# Patient Record
Sex: Male | Born: 1955
Health system: Southern US, Community
[De-identification: ages and names within clinical notes are randomized; demographics above are authoritative.]

## PROBLEM LIST (undated history)

## (undated) DIAGNOSIS — D696 Thrombocytopenia, unspecified: Secondary | ICD-10-CM

## (undated) DIAGNOSIS — R768 Other specified abnormal immunological findings in serum: Secondary | ICD-10-CM

## (undated) DIAGNOSIS — I255 Ischemic cardiomyopathy: Secondary | ICD-10-CM

## (undated) DIAGNOSIS — N183 Chronic kidney disease, stage 3 unspecified: Secondary | ICD-10-CM

## (undated) DIAGNOSIS — R7689 Other specified abnormal immunological findings in serum: Secondary | ICD-10-CM

## (undated) DIAGNOSIS — I213 ST elevation (STEMI) myocardial infarction of unspecified site: Secondary | ICD-10-CM

## (undated) DIAGNOSIS — R7303 Prediabetes: Secondary | ICD-10-CM

## (undated) DIAGNOSIS — H269 Unspecified cataract: Secondary | ICD-10-CM

## (undated) DIAGNOSIS — I2510 Atherosclerotic heart disease of native coronary artery without angina pectoris: Secondary | ICD-10-CM

## (undated) DIAGNOSIS — I5022 Chronic systolic (congestive) heart failure: Secondary | ICD-10-CM

## (undated) HISTORY — DX: Chronic kidney disease, stage 3 unspecified: N18.30

## (undated) HISTORY — DX: ST elevation (STEMI) myocardial infarction of unspecified site: I21.3

## (undated) HISTORY — DX: Unspecified cataract: H26.9

## (undated) HISTORY — DX: Thrombocytopenia, unspecified: D69.6

## (undated) HISTORY — DX: Ischemic cardiomyopathy: I25.5

## (undated) HISTORY — DX: Atherosclerotic heart disease of native coronary artery without angina pectoris: I25.10

## (undated) HISTORY — DX: Other specified abnormal immunological findings in serum: R76.89

## (undated) HISTORY — DX: Chronic systolic (congestive) heart failure: I50.22

## (undated) HISTORY — DX: Other specified abnormal immunological findings in serum: R76.8

---

## 2000-02-12 ENCOUNTER — Encounter: Payer: Self-pay | Admitting: Family Medicine

## 2000-02-12 ENCOUNTER — Ambulatory Visit (HOSPITAL_COMMUNITY): Admission: RE | Admit: 2000-02-12 | Discharge: 2000-02-12 | Payer: Self-pay | Admitting: *Deleted

## 2002-12-10 DIAGNOSIS — I213 ST elevation (STEMI) myocardial infarction of unspecified site: Secondary | ICD-10-CM

## 2002-12-10 HISTORY — DX: ST elevation (STEMI) myocardial infarction of unspecified site: I21.3

## 2003-07-09 ENCOUNTER — Emergency Department (HOSPITAL_COMMUNITY): Admission: EM | Admit: 2003-07-09 | Discharge: 2003-07-09 | Payer: Self-pay | Admitting: Emergency Medicine

## 2003-08-22 ENCOUNTER — Inpatient Hospital Stay (HOSPITAL_COMMUNITY): Admission: EM | Admit: 2003-08-22 | Discharge: 2003-08-26 | Payer: Self-pay | Admitting: Emergency Medicine

## 2003-08-22 ENCOUNTER — Encounter: Payer: Self-pay | Admitting: Emergency Medicine

## 2003-08-22 ENCOUNTER — Encounter: Payer: Self-pay | Admitting: Cardiovascular Disease

## 2003-08-22 HISTORY — PX: CORONARY ANGIOPLASTY WITH STENT PLACEMENT: SHX49

## 2005-12-24 ENCOUNTER — Ambulatory Visit: Payer: Self-pay | Admitting: Family Medicine

## 2006-02-04 ENCOUNTER — Ambulatory Visit: Payer: Self-pay | Admitting: Family Medicine

## 2006-03-27 ENCOUNTER — Ambulatory Visit: Payer: Self-pay | Admitting: Family Medicine

## 2006-04-16 ENCOUNTER — Ambulatory Visit: Payer: Self-pay | Admitting: Family Medicine

## 2006-05-23 ENCOUNTER — Ambulatory Visit: Payer: Self-pay | Admitting: Family Medicine

## 2006-05-23 LAB — CONVERTED CEMR LAB: PSA: 1.2 ng/mL

## 2006-05-27 ENCOUNTER — Ambulatory Visit: Payer: Self-pay | Admitting: Family Medicine

## 2006-06-05 ENCOUNTER — Ambulatory Visit: Payer: Self-pay | Admitting: Internal Medicine

## 2006-06-25 ENCOUNTER — Emergency Department (HOSPITAL_COMMUNITY): Admission: EM | Admit: 2006-06-25 | Discharge: 2006-06-25 | Payer: Self-pay | Admitting: Emergency Medicine

## 2006-11-21 ENCOUNTER — Ambulatory Visit: Payer: Self-pay | Admitting: Family Medicine

## 2006-11-21 LAB — CONVERTED CEMR LAB: Microalbumin U total vol: 1.3 mg/L

## 2006-11-25 ENCOUNTER — Ambulatory Visit: Payer: Self-pay | Admitting: Family Medicine

## 2007-05-27 ENCOUNTER — Ambulatory Visit: Payer: Self-pay | Admitting: Family Medicine

## 2007-05-27 LAB — CONVERTED CEMR LAB
ALT: 27 units/L (ref 0–40)
AST: 18 units/L (ref 0–37)
Albumin: 3.8 g/dL (ref 3.5–5.2)
Alkaline Phosphatase: 69 units/L (ref 39–117)
BUN: 17 mg/dL (ref 6–23)
CO2: 27 meq/L (ref 19–32)
Creatinine, Ser: 1.4 mg/dL (ref 0.4–1.5)
GFR calc Af Amer: 69 mL/min
GFR calc non Af Amer: 57 mL/min
Microalb, Ur: 0.2 mg/dL (ref 0.0–1.9)
Sodium: 141 meq/L (ref 135–145)
TSH: 1.64 microintl units/mL (ref 0.35–5.50)
Total Bilirubin: 0.4 mg/dL (ref 0.3–1.2)
Total CHOL/HDL Ratio: 4.6
Total Protein: 6.4 g/dL (ref 6.0–8.3)

## 2007-05-28 ENCOUNTER — Encounter: Payer: Self-pay | Admitting: Family Medicine

## 2007-05-28 DIAGNOSIS — E749 Disorder of carbohydrate metabolism, unspecified: Secondary | ICD-10-CM | POA: Insufficient documentation

## 2007-05-29 ENCOUNTER — Ambulatory Visit: Payer: Self-pay | Admitting: Family Medicine

## 2008-04-29 ENCOUNTER — Telehealth: Payer: Self-pay | Admitting: Family Medicine

## 2008-04-30 ENCOUNTER — Ambulatory Visit: Payer: Self-pay | Admitting: Family Medicine

## 2008-04-30 DIAGNOSIS — R768 Other specified abnormal immunological findings in serum: Secondary | ICD-10-CM | POA: Insufficient documentation

## 2008-05-04 ENCOUNTER — Ambulatory Visit: Payer: Self-pay | Admitting: Family Medicine

## 2008-05-04 LAB — CONVERTED CEMR LAB
AST: 20 units/L (ref 0–37)
CO2: 29 meq/L (ref 19–32)
Calcium: 9.2 mg/dL (ref 8.4–10.5)
Chloride: 107 meq/L (ref 96–112)
Creatinine, Ser: 1.5 mg/dL (ref 0.4–1.5)
GFR calc non Af Amer: 52 mL/min
Glucose, Bld: 128 mg/dL — ABNORMAL HIGH (ref 70–99)
HCV Ab: POSITIVE — AB
Hep B C IgM: NEGATIVE
Sodium: 138 meq/L (ref 135–145)
Total Protein: 6.2 g/dL (ref 6.0–8.3)

## 2008-05-05 ENCOUNTER — Ambulatory Visit: Payer: Self-pay | Admitting: Family Medicine

## 2008-08-27 ENCOUNTER — Ambulatory Visit: Payer: Self-pay | Admitting: Gastroenterology

## 2008-09-28 ENCOUNTER — Ambulatory Visit: Payer: Self-pay | Admitting: Family Medicine

## 2008-09-28 LAB — CONVERTED CEMR LAB
ALT: 36 units/L (ref 0–53)
AST: 51 units/L — ABNORMAL HIGH (ref 0–37)
Albumin: 4 g/dL (ref 3.5–5.2)
Alkaline Phosphatase: 61 units/L (ref 39–117)
BUN: 16 mg/dL (ref 6–23)
Basophils Absolute: 0 10*3/uL (ref 0.0–0.1)
Basophils Relative: 0.3 % (ref 0.0–3.0)
Bilirubin, Direct: 0.2 mg/dL (ref 0.0–0.3)
CO2: 32 meq/L (ref 19–32)
Calcium: 9.2 mg/dL (ref 8.4–10.5)
Chloride: 105 meq/L (ref 96–112)
Cholesterol: 149 mg/dL (ref 0–200)
Creatinine, Ser: 1.4 mg/dL (ref 0.4–1.5)
Creatinine,U: 127.1 mg/dL
Eosinophils Absolute: 0.2 10*3/uL (ref 0.0–0.7)
Eosinophils Relative: 4.5 % (ref 0.0–5.0)
GFR calc Af Amer: 68 mL/min
GFR calc non Af Amer: 56 mL/min
Glucose, Bld: 91 mg/dL (ref 70–99)
HCT: 46.9 % (ref 39.0–52.0)
HDL: 28.8 mg/dL — ABNORMAL LOW (ref >39.0)
Hemoglobin: 16.4 g/dL (ref 13.0–17.0)
LDL Cholesterol: 107 mg/dL — ABNORMAL HIGH (ref 0–99)
Lymphocytes Relative: 31.6 % (ref 12.0–46.0)
MCHC: 35 g/dL (ref 30.0–36.0)
MCV: 92.1 fL (ref 78.0–100.0)
Microalb Creat Ratio: 1.6 mg/g (ref 0.0–30.0)
Microalb, Ur: 0.2 mg/dL (ref 0.0–1.9)
Monocytes Absolute: 0.4 10*3/uL (ref 0.1–1.0)
Monocytes Relative: 7.9 % (ref 3.0–12.0)
Neutro Abs: 2.8 10*3/uL (ref 1.4–7.7)
Neutrophils Relative %: 55.7 % (ref 43.0–77.0)
Platelets: 113 10*3/uL — ABNORMAL LOW (ref 150–400)
Potassium: 4.2 meq/L (ref 3.5–5.1)
RBC: 5.14 M/uL (ref 4.22–5.81)
RDW: 12.3 % (ref 11.5–14.6)
Sodium: 141 meq/L (ref 135–145)
Total Bilirubin: 1 mg/dL (ref 0.3–1.2)
Total CHOL/HDL Ratio: 5.2
Total Protein: 6.6 g/dL (ref 6.0–8.3)
Triglycerides: 64 mg/dL (ref 0–149)
VLDL: 13 mg/dL (ref 0–40)
WBC: 5.3 10*3/uL (ref 4.5–10.5)

## 2008-10-05 ENCOUNTER — Ambulatory Visit: Payer: Self-pay | Admitting: Family Medicine

## 2008-11-24 ENCOUNTER — Ambulatory Visit: Payer: Self-pay | Admitting: Family Medicine

## 2008-11-24 ENCOUNTER — Encounter (INDEPENDENT_AMBULATORY_CARE_PROVIDER_SITE_OTHER): Payer: Self-pay | Admitting: *Deleted

## 2008-11-24 LAB — CONVERTED CEMR LAB
OCCULT 1: NEGATIVE
OCCULT 2: NEGATIVE
OCCULT 3: NEGATIVE

## 2009-10-12 ENCOUNTER — Ambulatory Visit: Payer: Self-pay | Admitting: Family Medicine

## 2010-07-13 ENCOUNTER — Encounter (INDEPENDENT_AMBULATORY_CARE_PROVIDER_SITE_OTHER): Payer: Self-pay | Admitting: *Deleted

## 2010-07-28 ENCOUNTER — Telehealth (INDEPENDENT_AMBULATORY_CARE_PROVIDER_SITE_OTHER): Payer: Self-pay | Admitting: *Deleted

## 2010-07-31 ENCOUNTER — Ambulatory Visit: Payer: Self-pay | Admitting: Family Medicine

## 2010-07-31 LAB — CONVERTED CEMR LAB
AST: 21 units/L (ref 0–37)
Albumin: 3.8 g/dL (ref 3.5–5.2)
BUN: 17 mg/dL (ref 6–23)
Basophils Relative: 0.5 % (ref 0.0–3.0)
Cholesterol: 161 mg/dL (ref 0–200)
Creatinine, Ser: 1.4 mg/dL (ref 0.4–1.5)
Eosinophils Relative: 6.2 % — ABNORMAL HIGH (ref 0.0–5.0)
GFR calc non Af Amer: 69.41 mL/min (ref >60)
Glucose, Bld: 101 mg/dL — ABNORMAL HIGH (ref 70–99)
HCT: 49.9 % (ref 39.0–52.0)
HDL: 31.7 mg/dL — ABNORMAL LOW (ref >39.00)
LDL Cholesterol: 116 mg/dL — ABNORMAL HIGH (ref 0–99)
Lymphs Abs: 1.8 10*3/uL (ref 0.7–4.0)
MCV: 92.2 fL (ref 78.0–100.0)
Monocytes Absolute: 0.4 10*3/uL (ref 0.1–1.0)
Monocytes Relative: 8.1 % (ref 3.0–12.0)
Neutrophils Relative %: 50.3 % (ref 43.0–77.0)
PSA: 2.01 ng/mL (ref 0.10–4.00)
Platelets: 115 10*3/uL — ABNORMAL LOW (ref 150.0–400.0)
RBC: 5.41 M/uL (ref 4.22–5.81)
Total Bilirubin: 0.6 mg/dL (ref 0.3–1.2)
Triglycerides: 66 mg/dL (ref 0.0–149.0)
VLDL: 13.2 mg/dL (ref 0.0–40.0)
WBC: 5.1 10*3/uL (ref 4.5–10.5)

## 2010-08-03 ENCOUNTER — Ambulatory Visit: Payer: Self-pay | Admitting: Family Medicine

## 2010-08-04 ENCOUNTER — Ambulatory Visit (HOSPITAL_BASED_OUTPATIENT_CLINIC_OR_DEPARTMENT_OTHER): Admission: RE | Admit: 2010-08-04 | Discharge: 2010-08-04 | Payer: Self-pay | Admitting: Orthopedic Surgery

## 2010-08-07 ENCOUNTER — Ambulatory Visit: Payer: Self-pay | Admitting: Cardiology

## 2010-08-11 ENCOUNTER — Telehealth (INDEPENDENT_AMBULATORY_CARE_PROVIDER_SITE_OTHER): Payer: Self-pay | Admitting: *Deleted

## 2010-08-22 ENCOUNTER — Encounter: Payer: Self-pay | Admitting: Cardiology

## 2010-08-22 ENCOUNTER — Ambulatory Visit: Payer: Self-pay

## 2010-10-10 ENCOUNTER — Telehealth (INDEPENDENT_AMBULATORY_CARE_PROVIDER_SITE_OTHER): Payer: Self-pay | Admitting: *Deleted

## 2010-10-16 ENCOUNTER — Telehealth (INDEPENDENT_AMBULATORY_CARE_PROVIDER_SITE_OTHER): Payer: Self-pay | Admitting: *Deleted

## 2011-01-11 NOTE — Progress Notes (Signed)
Summary: CALLED PT  Phone Note Outgoing Call Call back at [facility] Phone [phone]   Call placed by: Harlon Flor,  October 16, 2010 10:51 AM Call placed to: Patient Summary of Call: CALLED TO SCHEDULE FASTING LABS Initial call taken by: Harlon Flor,  October 16, 2010 10:51 AM

## 2011-01-11 NOTE — Letter (Signed)
Summary: PHI  PHI   Imported By: Harlon Flor 08/22/2010 11:14:53  _____________________________________________________________________  External Attachment:    Type:   Image     Comment:   External Document

## 2011-01-11 NOTE — Cardiovascular Report (Signed)
Summary: Cardiac Cath Other  Cardiac Cath Other   Imported By: Harlon Flor 08/22/2010 11:13:33  _____________________________________________________________________  External Attachment:    Type:   Image     Comment:   External Document

## 2011-01-11 NOTE — Progress Notes (Signed)
Summary: CALLED PT  Phone Note Outgoing Call Call back at [facility] Phone [phone]   Call placed by: Harlon Flor,  October 10, 2010 10:56 AM Call placed to: Patient Summary of Call: [facility] TO SCHEDULE LAB APPT Initial call taken by: Harlon Flor,  October 10, 2010 10:57 AM

## 2011-01-11 NOTE — Progress Notes (Signed)
----   Converted from flag ---- ---- 08/09/2010 2:58 PM, Sydell Axon LPN wrote: Sherron Monday to patient and was informed that he had the colonoscopy at [facility] out patient about 5-6 years ago. In the meantime, he will check his records and try to find out who did it because he does not remember the doctor's name. Regina ---- 08/07/2010 8:27 AM, Delilah Shan  CMA (AAMA) wrote: Left message on voicemail  to return call.   ---- 08/04/2010 8:27 AM, Delilah Shan CMA (AAMA) wrote: Left message on voicemail  to return call.   ---- 08/03/2010 9:19 PM, Crawford Givens MD wrote: please call him and see if you can get any more details.  thanks.   ---- 08/03/2010 9:33 AM, Delilah Shan CMA (AAMA) wrote: The main office checked his chart and there is no colonoscopy report in it at all.  Perhaps he went to another GI office in [place].  We would need more info from him, I suppose. ------------------------------  Appended Document:     Clinical Lists Changes  Observations: Added new observation of DM PROGRESS: N/A (08/11/2010 15:17) Added new observation of DM FSREVIEW: N/A (08/11/2010 15:17) Added new observation of HTN PROGRESS: N/A (08/11/2010 15:17) Added new observation of HTN FSREVIEW: N/A (08/11/2010 15:17) Added new observation of COLPO: normal:   (02/12/2000 15:18)       Colposcopy  Procedure date:  02/12/2000  Findings:      normal:      Prevention & Chronic Care Immunizations   Influenza vaccine: Fluvax 3+  (09/27/2008)   Influenza vaccine due: 08/10/2010    Tetanus booster: 08/04/2010: Tdap    Pneumococcal vaccine: Not documented   Pneumococcal vaccine due: 09/19/2020  Colorectal Screening   Hemoccult: negative  (11/24/2008)   Hemoccult action/deferral: NEG X 1 today  (08/03/2010)    Colonoscopy: Not documented  Other Screening   PSA: 2.01  (07/31/2010)   PSA due due: 08/01/2011   Smoking status: quit  (05/28/2007)  Lipids   Total Cholesterol: 161   (07/31/2010)   LDL: 116  (07/31/2010)   LDL Direct: Not documented   HDL: 31.70  (07/31/2010)   Triglycerides: 66.0  (07/31/2010)    SGOT (AST): 21  (07/31/2010)   SGPT (ALT): 29  (07/31/2010)   Alkaline phosphatase: 66  (07/31/2010)   Total bilirubin: 0.6  (07/31/2010)  Self-Management Support :    Lipid self-management support: Not documented

## 2011-01-11 NOTE — Letter (Signed)
Summary: Nadara Eaton letter  [place] [facility]  [address] [place], [place] [address]   Phone: [phone]  Fax: [phone]       07/13/2010 MRN: [id number]  BENJAMYN HESTAND [address] [place], [place]  [address]  Dear Mr. Melchor,  [facility] announce the retirement of Arta Silence, M.D., from full-time practice at the [facility] office effective June 08, 2010 and his plans of returning part-time.  It is important to Dr. Hetty Ely and to our practice that you understand that [facility] has seven physicians in our office for your health care needs.  We will continue to offer the same exceptional care that you have today.    Dr. Hetty Ely has spoken to many of you about his plans for retirement and returning part-time in the fall.   We will continue to work with you through the transition to schedule appointments for you in the office and meet the high standards that [facility] is committed to.   Again, it is with great pleasure that we share the news that Dr. Hetty Ely will return to [facility] in October of 2011 with a reduced schedule.    If you have any questions, or would like to request an appointment with one of our physicians, please call us at [phone] and press the option for Scheduling an appointment.  We take pleasure in providing you with excellent patient care and look forward to seeing you at your next office visit.  Our [facility] Physicians are:  Tillman Abide, M.D. Laurita Quint, M.D. Roxy Manns, M.D. Kerby Nora, M.D. Hannah Beat, M.D. Ruthe Mannan, M.D. We proudly welcomed Raechel Ache, M.D. and Eustaquio Boyden, M.D. to the practice in July/August 2011.  Sincerely,  [facility]

## 2011-01-11 NOTE — Progress Notes (Signed)
----   Converted from flag ---- ---- 07/28/2010 9:53 AM, Crawford Givens MD wrote: cmet 414.00 lipid 414.00 CBC 070.51 PSA v76.44  thanks.   ---- 07/28/2010 9:37 AM, Liane Comber CMA (AAMA) wrote: Peri Jefferson Morning! This pt is scheduled for cpx labs tomorrow, which labs to draw and dx codes to use? Thanks Tasha ------------------------------

## 2011-01-11 NOTE — Assessment & Plan Note (Signed)
Summary: NP6/AMD   Visit Type:  Initial Consult Primary Provider:  Dr. Para March  CC:  Establish care with cardiologist--had stents placed in 2004.Marland Kitchen  History of Present Illness: 55 yo with history of CAD s/p STEMI and DES x 2 in 2004 presents to establish outpatient cardiology followup.  He has been doing well in general since his MI.  He is currently only taking aspirin.  He was on Lipitor then Crestor for about 6 months post-MI but felt like they caused fatigue, so he stopped them.  He was on a beta blocker also briefly which caused fatigue as well.  He has had no recurrent CP or exertional shortness of breath.  He continues to work in [employer] without any problems.    ECG: NSR, inferior and anterolateral T wave inversions  Labs (8/11): LDL 116, HDL 32, K 4.1, creatinine 1.4, LFTs normal U Current Medications (verified): 1)  Aspirin 325 Mg  Tabs (Aspirin) .... Take One By Mouth Daily 2)  Fish Oil   Caps (Omega-3 Fatty Acids Caps) .Marland Kitchen.. 1 Daily By Mouth  Allergies (verified): No Known Drug Allergies  Past History:  Past Surgical History: Last updated: 05/28/2007 MI Stent  08/22/2003 ETT wnl 09/29/03  Family History: Last updated: 08/03/2010 Father: dec 42 Aneurysm of the brain, stroke Mother: A  ASCVD, HTN Brother A   40  MI- self, uncles Two of his children have diabetes  Social History: Last updated: 08/07/2010 Marital Status: Married, 1980 Lives in [place].  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 Coronary artery disease: STEMI 2004 with totally occluded CFX (infarct-related artery) and 80% mRCA stenosis.  Patient had Taxus DES to both vessels.  EF was 50-55% by LV-gram. 2. H/o HCV  Ab+ with prev eval at [facility] hepatology clinic  Family History: Reviewed history from 08/03/2010 and no changes required. Father: dec 42 Aneurysm of the brain, stroke Mother: A  ASCVD, HTN Brother A   56  MI- self, uncles Two of his children have diabetes  Social History: Marital Status: Married, 1980 Lives in [place].  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   Vital Signs:  Patient profile:   55 year old male Height:      69.5 inches Weight:      217 pounds BMI:     31.70 Pulse rate:   66 / minute BP sitting:   128 / 82  (left arm) Cuff size:   large  Vitals Entered By: Bishop Dublin, CMA (August 07, 2010 3:23 PM)  Physical Exam  General:  Well developed, well nourished, in no acute distress. Head:  normocephalic and atraumatic Nose:  no deformity, discharge, inflammation, or lesions Mouth:  Teeth, gums and palate normal. Oral mucosa normal. Neck:  Neck supple, no JVD. No masses, thyromegaly or abnormal cervical nodes. Lungs:  Clear bilaterally to auscultation and percussion. Heart:  Non-displaced PMI, chest non-tender; regular rate and rhythm, S1, S2 without murmurs, rubs or gallops. Carotid upstroke normal, no bruit.  Pedals normal pulses. No edema, no varicosities. Abdomen:  Bowel sounds positive; abdomen soft and non-tender without  masses, organomegaly, or hernias noted. No hepatosplenomegaly. Msk:  Back normal, normal gait. Muscle strength and tone normal. Extremities:  No clubbing or cyanosis. Neurologic:  Alert and oriented x 3. Skin:  Intact without lesions or rashes. Psych:  Normal affect.   Impression & Recommendations:  Problem # 1:  CORONARY ARTERY DISEASE,S/P MI 08/22/03, PTCA (ICD-414.00) Patient has had no recent ischemic symptoms and has excellent exercise tolerance.  We talked  about statin and ACEI today for secondary prevention.  He is willing to try a low dose of both.  I will start him on lisinopril 2.5 mg daily and pravastatin 40 mg daily.  This is a low dose of a low potency statin and tends to be well-tolerated.  Will get a BMET in 2 wks and lipids/LFTs in 2 months.  He will call us with any side effects.  He will continue ASA.  Would avoid beta blocker for now (made him fatigued).  Will get echo to assess LV function, no indication for stress test.   Problem # 2:  HYPERCHOLESTEROLEMIA, PURE, LDL 100/ DECREASED HDL (ICD-272.0) Starting pravastatin, goal LDL < 70.   Other Orders: EKG w/ Interpretation (93000) Echocardiogram (Echo)  Patient Instructions: 1)  Your physician has recommended you make the following change in your medication: Start Pravastatin 40 mg one tablet at bedtime 2)  with Lisinopril 2.5 mg take one tablet everyday.  3)  Your physician recommends that you return for lab work in:2 weeks for a BMP. 4)  Your physician recommends that you return for a FASTING lipid profile: LIV/LIP two months. 5)  Your physician recommends that you schedule a follow-up appointment in: one year. 6)  Your physician has requested that you have an echocardiogram.  Echocardiography is a painless test that uses sound waves to create images of your heart. It provides your doctor with information about the size and shape of your heart and how well your heart's chambers and valves are working.  This procedure takes approximately one hour. There are no restrictions for this procedure. Prescriptions: PRAVASTATIN SODIUM 40 MG TABS (PRAVASTATIN SODIUM) one tablet at bedtime  #30 x 1   Entered by:   Bishop Dublin, CMA   Authorized by:   Marca Ancona, MD   Signed by:   Bishop Dublin, CMA on 08/07/2010   Method used:   Electronically to        [facility] [address] (retail)       [address], [address]       [place], [place]  [address]       Ph:  [phone]       Fax: [phone]   RxID:   [id number] LISINOPRIL 2.5 MG TABS (LISINOPRIL) one tablet once daily  #30 x 3   Entered by:   Bishop Dublin, CMA   Authorized by:   Marca Ancona, MD   Signed by:   Bishop Dublin, CMA on 08/07/2010   Method used:   Electronically to        [facility] [address] (retail)       [address], [address]       [place], [place]  [address]       Ph: [phone]       Fax: [phone]   RxID:   [id number]

## 2011-01-11 NOTE — Procedures (Signed)
Summary: Colonoscopy/[facility]  Colonoscopy/[facility]   Imported By: Lanelle Bal 08/21/2010 12:00:28  _____________________________________________________________________  External Attachment:    Type:   Image     Comment:   External Document

## 2011-01-11 NOTE — Assessment & Plan Note (Signed)
Summary: CPX/DR SCHALLER'S PT/CLE   Vital Signs:  Patient profile:   55 year old male Height:      69.5 inches Weight:      217.75 pounds BMI:     31.81 Temp:     98.5 degrees F oral Pulse rate:   76 / minute Pulse rhythm:   regular BP sitting:   108 / 72  (left arm) Cuff size:   large  Vitals Entered By: Delilah Shan CMA [place] Nellums Dull) (August 03, 2010 8:34 AM) CC: CPX - RNS patient, Preventive Care   History of Present Illness: CPE- See prev med.   CAD- prev stented by Dr. Riley Kill.  No CP, SOB, BLE edema.  No NTG use.  Feeling well.  hasn't had follow up with cards in years.   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 Father: dec 42 Aneurysm of the brain, stroke Mother: A  ASCVD, HTN Brother A   54  MI- self, uncles Two of his children have diabetes  Social History: Reviewed history from 05/28/2007 and no changes required. 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  Otherwise negative.    Physical Exam  General:  GEN: nad, alert and oriented HEENT: mucous membranes moist NECK: supple w/o LA CV: rrr.  no murmur PULM: ctab, no inc wob ABD: soft, +bs EXT:  no edema SKIN: no acute rash  Rectal:  No external abnormalities noted. Normal sphincter tone. No rectal masses or tenderness. Prostate:  Prostate gland firm and smooth, no enlargement, nodularity, tenderness, mass, asymmetry or induration.   Impression & Recommendations:  Problem # 1:  Preventive Health Care (ICD-V70.0) Per patient, prev with neg colonosocopy but I don't have records to review.  We are working to obtain these. Tdap today, flu to be done at work.  We are requesting eval from hepatology clinic at [facility].   Problem # 2:  CORONARY ARTERY DISEASE,S/P MI 08/22/03, PTCA (ICD-414.00)  Refer back to cards for eval.  I didn't start statin today.  Patient has no symptoms and prev had fatigue on statin. His updated medication list for this problem includes:    Aspirin 325 Mg Tabs (Aspirin) .Marland Kitchen... Take one by mouth daily  Orders: Cardiology Referral (Cardiology)  Complete Medication List: 1)  Aspirin 325 Mg Tabs (Aspirin) .... Take one by mouth daily 2)  Fish Oil Caps (Omega-3 fatty acids caps) .Marland Kitchen.. 1 daily by mouth  Colorectal Screening:  Current Recommendations:    Hemoccult: NEG X 1 today  PSA Screening:    PSA: 2.01  (07/31/2010)  Immunization & Chemoprophylaxis:    Tetanus vaccine: Td  (12/11/1996)    Influenza vaccine: Fluvax 3+  (09/27/2008)  Patient Instructions: 1)  See Shirlee Limerick  about your referral before your leave today.   I would get a flu shot at work.  I'll contact you about the old records.  Take care.  Work on [employer] and keep exercising.  2)  Please schedule a follow-up appointment as needed .   Current Allergies (reviewed today): No known allergies      Prevention & Chronic Care Immunizations   Influenza vaccine: Fluvax 3+  (09/27/2008)   Influenza vaccine due: 08/10/2010    Tetanus booster: 12/11/1996: Td    Pneumococcal vaccine: Not documented   Pneumococcal vaccine due: 09/19/2020  Colorectal Screening   Hemoccult: negative  (11/24/2008)    Hemoccult action/deferral: NEG X 1 today  (08/03/2010)    Colonoscopy: Not documented  Other Screening   PSA: 2.01  (07/31/2010)   PSA due due: 08/01/2011   Smoking status: quit  (05/28/2007)  Lipids   Total Cholesterol: 161  (07/31/2010)   LDL: 116  (07/31/2010)   LDL Direct: Not documented   HDL: 31.70  (07/31/2010)   Triglycerides: 66.0  (07/31/2010)    SGOT (AST): 21  (07/31/2010)   SGPT (ALT): 29  (07/31/2010)   Alkaline phosphatase: 66  (07/31/2010)   Total bilirubin: 0.6  (07/31/2010)  Self-Management Support :    Lipid self-management support: Not documented     Appended Document: CPX/DR SCHALLER'S PT/CLE   Immunizations Administered:  Tetanus Vaccine:    Vaccine Type: Tdap    Site: left deltoid    Mfr: GlaxoSmithKline    Dose: 0.5 ml    Route: IM    Given by: Delilah Shan CMA (AAMA)    Exp. Date: 09/28/2012    Lot #: [id number]    VIS given: 10/28/07 version given August 04, 2010.

## 2011-04-27 NOTE — Procedures (Signed)
[place]  Patient:    Edward [name], Edward [name]                       MRN: [id number] Proc. Date: 02/12/00 Adm. Date:  [id number] Attending:  Mingo Amber CC:         Willis Modena. Dreiling, M.D.                           Procedure Report  PROCEDURE:  Video colonoscopy.  INDICATIONS:  Guaiac positive stool in a 55 year old male.  He essentially has o symptoms, and his preprocedure hemoglobin was 17.  PREPARATION:  He was n.p.o. since midnight having taking Phospho-Soda prep and  clear liquid diet yesterday.  The mucosa is clean throughout.  Depth of insertion, cecum.  PREPROCEDURE SEDATION:  He received a total of 80 mg of Demerol and 8 mg of Versed intravenously.  In addition, he was on 2 L of nasal cannula O2.  DESCRIPTION OF PROCEDURE:  The Olympus video colonoscope was inserted via the rectum and advanced quite easily to the hepatic flexure.  At this point, the _____ abdominal pressure was required in order to obtain the cecum.  Cecal landmarks ere identified and photographed.  On withdrawal, the mucosa was carefully evaluated and found to be entirely normal from the cecum to retroflexed view of the rectum. There were no areas of neoplasia, diverticulosis, or other abnormality.  There ere no significant internal hemorrhoids.  The patient tolerated the procedure well.  Pulse, blood pressure, and oximetry testing were stable throughout.  He was observed in recovery for 45 minutes and discharged home, alert with a benign abdomen.  IMPRESSION:  Normal colonoscopy.  PLAN:  Return to the care of Dr. Lattie Corns.  I will be glad to reevaluate if needed. DD:  02/12/00 TD:  02/13/00 Job: [id number]

## 2011-04-27 NOTE — H&P (Signed)
NAME:  Edward [name], Edward [name]                         ACCOUNT NO.:  [id number]   MEDICAL RECORD NO.:  [id number]                   PATIENT TYPE:  INP   LOCATION:  2931                                 FACILITY:  [facility]   PHYSICIAN:  Arturo Morton. Riley Kill, M.D.             DATE OF BIRTH:  17-Aug-1956   DATE OF ADMISSION:  08/22/2003  DATE OF DISCHARGE:                                HISTORY & PHYSICAL   CHIEF COMPLAINT:  Chest pain.   HISTORY OF PRESENT ILLNESS:  The patient is a 55 year old man who presents  to the emergency room with complaints of substernal chest tightness that  began at approximately 10 p.m.  The patient states that he had just eaten  dinner, and sat down to watch television when the pain occurred, which was  7/10 in intensity, and radiated across his chest, but not into his arms or  neck.  He denies shortness of breath, nausea, vomiting, diaphoresis.  The  pain was dull in nature.  He took an Alka-Seltzer at home for this gas  sensation, and this was without relief.  He was given nitroglycerin,  aspirin, and Toprol x3 in the ER without relief.  He had ongoing chest pain,  and other history shows that he has been having generalized weakness and  inability to fully exert himself for 1-2 weeks.  No recent fevers, chills,  dyspnea on exertion, orthopnea, PND, lower extremity edema, etc.  The team  was called at approximately 2:15 to 2:20 a.m. for further evaluation.   PAST MEDICAL HISTORY:  None.   MEDICATIONS:  None.   ALLERGIES:  None.   FAMILY HISTORY:  His mother is alive with a history of a stroke.  His father  died at age 65 of a brain aneurysm.  He has a brother who is healthy.  He  has three children, two of which have type 1 diabetes.   REVIEW OF SYSTEMS:  Other review of systems is completely negative for  general, constitutional, HEENT, cardiopulmonary, musculoskeletal, GI, GU,  neurologic, etc.   SOCIAL HISTORY:  The patient lives in [place] with his wife.   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  He has a 15 pack year history of smoking.  No  drug use.  No alcohol.   PHYSICAL EXAMINATION:  VITAL SIGNS:  On admission, temperature 98.1, blood  pressure 176/91, pulse 72, respirations 18.  He was satting 98% on room air.  GENERAL:  He is a comfortable-appearing man in no apparent distress,  complaining of 3/10 chest pain.  HEENT:  Within normal limits.  NECK:  Supple.  No JVD.  No bruits.  CARDIOVASCULAR:  Regular rate and rhythm.  No murmurs, rubs, or gallops.  LUNGS:  Clear to auscultation bilaterally.  No wheezes, rhonchi, or rales.  ABDOMEN:  Soft, nontender, nondistended.  No hepatosplenomegaly.  EXTREMITIES:  No clubbing, cyanosis, or edema.  There  were 3+ pulses in the  distal lower extremities.  NEUROLOGIC:  Intact.   LABORATORY DATA:  Sodium 141, potassium 3.5, chloride 103, bicarb 32, BUN  13, creatinine 1.5, glucose 136.  White count 8.4, hematocrit 49, platelets  128.  PT 12.6, INR 0.9, PTT 33.  MB is 26.  Troponin is 0.46.  Myoglobin is  greater than 500.  EKG shows ST elevations in V1 through V3 with reciprocal  ST depressions in the inferior leads.   ASSESSMENT AND PLAN:  A 55 year old man with tobacco history and newly-  diagnosed hypertension who presents with anterior ST elevation myocardial  infarction and approximately five hours of chest pain.  ST elevation  myocardial infarction.  The patient will go emergently to the  catheterization laboratory after receiving Integrelin, heparin, aspirin,  Plavix, nitroglycerin, and beta blocker in the emergency room.  Given the  distribution of his EKG changes, we strongly suspect an left anterior  descending lesion.  The patient was encouraged to quit smoking.  We will  check his lipids and continue beta blocker, ACE inhibitor.  His hemoglobin  A1C will be checked, and we will follow his blood pressure closely.  Following the catheterization, he will have post-myocardial  infarction care  in the CCU, and be followed for further chest pain and more evidence for  hypertension.      Patric Dykes, M.D.                       Arturo Morton. Riley Kill, M.D.    JF/MEDQ  D:  08/22/2003  T:  08/23/2003  Job:  [id number]

## 2011-04-27 NOTE — Cardiovascular Report (Signed)
NAME:  Edward [name], Edward [name]                         ACCOUNT NO.:  [id number]   MEDICAL RECORD NO.:  [id number]                   PATIENT TYPE:  INP   LOCATION:  6526                                 FACILITY:  [facility]   PHYSICIAN:  Arturo Morton. Riley Kill, M.D.             DATE OF BIRTH:  Oct 26, 1956   DATE OF PROCEDURE:  08/25/2003  DATE OF DISCHARGE:                              CARDIAC CATHETERIZATION   INDICATIONS:  Mr. Kent is a delightful 55 year old gentleman who presented  with an acute infarction.  He had total occlusion of the circumflex which  was successfully stented.  He had residual high grade disease of a very  large right coronary artery and it was felt that percutaneous intervention  of this artery should be done before discharge.  Risks, benefits, and  alternatives were discussed with the patient.  He was agreeable to proceed.   PROCEDURE:  1. Percutaneous stenting of the right coronary artery.  2. Selective coronary arteriography of the left coronary artery.   DESCRIPTION OF PROCEDURE:  The patient was brought to the catheterization  laboratory and prepped and draped in the usual fashion.  Through an anterior  puncture the left femoral artery was entered.  The patient had a blood  pressure in the range of about 100 systolic and therefore we gave  intravenous fluids.  Using a diagnostic left catheter we injected the left  coronary artery and documented patency of the circumflex vessel.  Following  this, a JR4 guiding catheter was advanced to the central aorta.  Angiomax  was given according to protocol and an ACT was checked and was 325 seconds.  A JR4 with side holes was utilized to intubate the right coronary.  Following this, a very small dose of intracoronary nitroglycerin was  administered which demonstrated a very large caliber right coronary artery.  A high-torque floppy wire was then passed down the vessel and the vessel was  directly stented using a 33 x 3.5 Taxus  stent.  This was taken to 14  atmospheres and then post dilated with 4 mm balloon up to about 14  atmospheres in various locations throughout the stent.  There was an  excellent angiographic appearance at the completion of the procedure.   ANGIOGRAPHIC DATA:  1. The left main coronary has some tapering at the ostium, but no     significant stenosis.  2. The circumflex at the site of previous stenting shows a step up and step     down without critical narrowing.  There is excellent TIMI 3 flow into the     distal vessel.  3. The right coronary artery demonstrate some mild ostial irregularity and a     long area of segmental disease of about 80-85% in the mid vessel.  The     PDA has about 30-40% ostial narrowing.  There is also an area of mild     eccentric luminal  irregularity in the distal vessel.  Following stenting,     the area that was stented was reduced to 0% residual luminal narrowing.     There was an excellent angiographic appearance.   CONCLUSIONS:  Successful percutaneous stenting of the right coronary artery.    DISPOSITION:  The patient will be treated medically with aspirin and Plavix.  He will need careful long-term follow-up.                                               Arturo Morton. Riley Kill, M.D.    TDS/MEDQ  D:  08/25/2003  T:  08/25/2003  Job:  [id number]   cc:   CV Lab

## 2011-04-27 NOTE — Cardiovascular Report (Signed)
NAME:  Edward [name], Edward [name]                         ACCOUNT NO.:  [id number]   MEDICAL RECORD NO.:  [id number]                   PATIENT TYPE:  INP   LOCATION:  2931                                 FACILITY:  [facility]   PHYSICIAN:  Arturo Morton. Riley Kill, M.D.             DATE OF BIRTH:  02/04/1956   DATE OF PROCEDURE:  08/22/2003  DATE OF DISCHARGE:                              CARDIAC CATHETERIZATION   INDICATIONS:  Edward [name] is a delightful 55 year old gentleman who presented  to the emergency room with ongoing chest pain.  Enzymes were positive.  There were borderline echocardiographic abnormalities.  He was seen by Heloise Beecham, M.D. [facility] and subsequently referred for reperfusion therapy.   PROCEDURE:  1. Left heart catheterization.  2. Selective coronary arteriography.  3. Selective left ventriculography.  4. Percutaneous stenting of the circumflex marginal coronary artery.   DESCRIPTION OF PROCEDURE:  The patient was brought to the catheterization  laboratory and prepped and draped in the usual fashion.  Integrilin was  running at the time of the procedure.  ACT was checked and was found to be  appropriate.  Views of the left and right coronary arteries were obtained in  multiple angiographic projections.  Ventriculography was performed in both  the RAO and LAO projection.  The patient was noted to have an occlusion of  the circumflex marginal.  A JL3.5 guiding catheter was then utilized to  intubate the left main.  A 7-French system was used.  A 0.014 high-torque  floppy wire was then shaped and used across the site of total occlusion.  Pre dilatation was done with a 2.25 mm balloon.  The lesion was subsequently  stented using a 2.5 x 20 mm length [employer] Taxus stent.  The stent  was then post dilated with a 2.5 mm noncompliant balloon.  There was  reestablishment of TIMI 3 flow.  The patient tolerated the procedure well.  All catheters were subsequently removed and the  femoral sheath was sewn into  place.  He was taken to the CCU in satisfactory clinical condition.   HEMODYNAMIC DATA:  1. Central aortic pressure 142/86, mean 111.  2. Left ventricular pressure 135/9/21 .  3. No gradient on pullback across the aortic valve.   ANGIOGRAPHIC DATA:  1. The left main coronary artery was free of critical disease.  2. The left anterior descending artery coursed to the apex.  There was a 60%     area of segmental luminal irregularity noted in the proximal vessel     overlapping the origins of the two septal perforators.  There were distal     diagonal branches noted and the distal LAD wrapped the apex.  There was a     moderate amount of luminal irregularity noted throughout the LAD.  3. The circumflex provides a first marginal branch which is totally occluded     with TIMI 0 flow.  There is very faint opacification of the distal vessel     but this would not qualify for TIMI 1 flow.  There is segmental disease     of about 60% in the AV circumflex beyond the origin of the marginal     branch and this provides a smaller marginal system.  Following     reperfusion therapy and stenting, the total occlusion is reduced to 0%     residual luminal narrowing.  There is excellent TIMI 3 flow at the     completion of procedure.  4. The right coronary artery demonstrates a long area of 80% segmental     narrowing in the mid vessel.  The vessel is a large caliber vessel     appearing between 3.5-4 mm.  The vessel provides a posterior descending     branch which has about a segmental area of smooth 50-60% narrowing.     There is a first posterolateral branch that has about 30% irregularity     and a second posterolateral branch of about 50% irregularity.  5. The ventriculogram demonstrates well preserved left ventricular function     on the RAO study.  There is not a definite wall motion abnormality,     although there is a hint of anteroapical hypokinesis.  In the LAO      projection the inferolateral segment appears to be perhaps moderately     hypokinetic.   CONCLUSIONS:  Acute myocardial infarction due to occlusion of the circumflex  marginal branch with successful reperfusion therapy and stenting.   DISPOSITION:  The patient will be treated medically.  He will need to  discontinue smoking.  Lipid management will also be important.  He is being  treated with beta blockade as well as glycoprotein inhibitors and platelet  antagonists.  He will need at least six months of oral clopidogrel.  Elective percutaneous intervention of the right coronary artery later next  week will be undertaken given the size of the right coronary artery and the  severity of the lesion.                                               Arturo Morton. Riley Kill, M.D.    TDS/MEDQ  D:  08/23/2003  T:  08/23/2003  Job:  [id number]   cc:   CV Lab

## 2011-04-27 NOTE — Discharge Summary (Signed)
NAME:  Edward [name], Edward [name]                         ACCOUNT NO.:  [id number]   MEDICAL RECORD NO.:  [id number]                   PATIENT TYPE:  INP   LOCATION:  6526                                 FACILITY:  [facility]   PHYSICIAN:  Arturo Morton. Riley Kill, M.D.             DATE OF BIRTH:  05-Aug-1956   DATE OF ADMISSION:  08/22/2003  DATE OF DISCHARGE:  08/26/2003                           DISCHARGE SUMMARY - REFERRING   PROCEDURES:  Emergent coronary artery stenting on August 22, 2003; staged  coronary artery stenting on August 25, 2003.   REASON FOR ADMISSION:  Please refer to dictated admission note.   LABORATORY DATA:  Cardiac enzymes:  Peak CPK 2955/446 (15%); peak troponin I  76.6.  Lipid profile:  Total cholesterol 152, triglycerides 145, HDL 33, LDL  90 (cholesterol/HDL ratio 4.6).  Hemoglobin A1C 5.7.  Normal electrolytes  and renal function.  Normal liver enzymes.  Thrombocytopenia:  128 on  admission, 116 at discharge.   Admission chest x-ray:  No active disease.   HOSPITAL COURSE:  Following presentation to the emergency room with  complaint of postprandial chest pain with no associated symptoms, the  patient was found to have electrocardiogram evidence of ST elevation  anterior myocardial infarction with approximately five hours of chest pain.  He was stabilized on aspirin, beta blocker, heparin, and nitroglycerin.  Additionally, he was placed on Integrilin, loaded with Plavix, and taken  directly to the catheterization lab to undergo emergent intervention by Dr.  Riley Kill.   The patient was found to have significant two-vessel coronary artery disease  with 100% occlusion of the left circumflex and 80% mid RCA.  Additionally,  there was residual 60% mid LAD disease.   Dr. Riley Kill proceeded with successful stenting (TAXUS) of the 100% CFX  lesion to 0% residual stenosis and no noted complications (of note, residual  70% distal CFX along noted).  Left ventricular function  was performed (EF 50-  55%).   The patient was then taken back three days later for staged stenting of the  80% RCA lesion, again with a TAXUS stent, again with no noted complications.   The patient was kept for overnight observation and cleared for discharge the  following morning in hemodynamically stable condition.  The patient was  ambulating without chest pain and bilateral groin examination revealed no  evidence of hematoma or bruit.   Of note, the patient did have mild thrombocytopenia which remained stable on  full-dose aspirin and Plavix.  The recommendation would be to continue full  dose for one month and then down titrate to 81 mg daily in conjunction with  Plavix.   The patient presented with no known history of hypertension, but had blood  pressure reading of 176/90 on presentation.  He was initially placed on  Captopril, but this was subsequently discontinued secondary to hypotension.  ACE inhibitor may be added in the future as an outpatient, providing blood  pressure remained stable.   The patient will need to remain on Plavix for at least six months.   DISCHARGE MEDICATIONS:  1. Plavix 75 mg daily (at least six months).  2. Coated aspirin 325 mg daily (x 1 month and then 81 mg daily).  3. Toprol XL 25 mg daily.  4. Zocor 40 mg q.h.s.  5. Nitrostat 0.4 mg p.r.n.   ACTIVITY:  No strenuous activity, heavy lifting, or return to work until  seen by physician.  No driving x 1 week.   DIET:  Maintain a low-fat/cholesterol diet.   SPECIAL INSTRUCTIONS:  Stop smoking tobacco.   FOLLOWUP:  The patient is scheduled to follow up with Arturo Morton. Riley Kill,  M.D., on September 13, 2003, at 11:15 a.m.   DISCHARGE DIAGNOSES:  1. Acute anterior ST elevation myocardial infarction.     a. Emergent stent (TAXUS) of 100% circumflex artery on August 22, 2003.     b. Staged stent (TAXUS) of 80% mid right coronary artery on August 25, 2003.     c. Preserved left  ventricular function.  2. Mild thrombocytopenia.  3. Hypertension.  4. Dyslipidemia.  5. Tobacco.      Gene Serpe, P.A. [facility]                      Thomas D. Riley Kill, M.D.    GS/MEDQ  D:  08/26/2003  T:  08/26/2003  Job:  [id number]

## 2011-06-18 ENCOUNTER — Ambulatory Visit (INDEPENDENT_AMBULATORY_CARE_PROVIDER_SITE_OTHER): Payer: BC Managed Care – PPO | Admitting: Family Medicine

## 2011-06-18 ENCOUNTER — Encounter: Payer: Self-pay | Admitting: Family Medicine

## 2011-06-18 DIAGNOSIS — M791 Myalgia, unspecified site: Secondary | ICD-10-CM

## 2011-06-18 DIAGNOSIS — IMO0001 Reserved for inherently not codable concepts without codable children: Secondary | ICD-10-CM

## 2011-06-18 MED ORDER — TRAMADOL HCL 50 MG PO TABS: 50.0000 mg | ORAL_TABLET | Freq: Four times a day (QID) | ORAL | Status: AC | PRN

## 2011-06-18 NOTE — Assessment & Plan Note (Addendum)
With flu like symtpoms, ie dec in appetite and aches, but lungs are clear and he has a benign exam o/w.  We talked about the options.  There is no clear trigger/cause.  I would observe the sx, treat the aches with tramadol, and he'll notify me of progression of sx.  He agrees.  Nontoxic and okay for outpatient f/u.  I would hold out of work given the high temperatures and his work outside, if only to allow rest and recuperation.  Consider basic lab w/u if sx persist.  He agrees with plan.

## 2011-06-18 NOTE — Patient Instructions (Signed)
Take the tramadol for pain.  Drink plenty of fluids and let me know if you aren't improved by Thursday.  Out of work until the aches are improved.

## 2011-06-18 NOTE — Progress Notes (Signed)
He had seen cards but was hesitant to start statin, ACE.  We talked about this today.   He recently got really hot while fishing over the weekend and then had cold chills.  Muscle aches, diffuse x 2 days.  1 episode of sweats.  Taking tylenol. Nausea but no vomiting.  Dec appetite.  Deep breath can cause some pain, if in cold air, ie in an air conditioned area.  O/w, no CP, no ble edema.  A few sick contacts at work.  No tick bites.  Asking for advice.  The aches are the biggest trouble for him. No jaundice.  Meds, vitals, and allergies reviewed.   ROS: See HPI.  Otherwise, noncontributory.  GEN: nad, alert and oriented HEENT: mucous membranes moist, tm wnl, nasal and oral exam wnl NECK: supple w/o LA CV: rrr.  PULM: ctab, no inc wob ABD: soft, +bs, not ttp EXT: no edema SKIN: no acute rash

## 2012-08-12 ENCOUNTER — Encounter: Payer: Self-pay | Admitting: Family Medicine

## 2012-08-12 ENCOUNTER — Ambulatory Visit (INDEPENDENT_AMBULATORY_CARE_PROVIDER_SITE_OTHER): Payer: BC Managed Care – PPO | Admitting: Family Medicine

## 2012-08-12 ENCOUNTER — Telehealth: Payer: Self-pay | Admitting: Family Medicine

## 2012-08-12 VITALS — BP 112/78 | HR 88 | Temp 98.6°F | Wt 197.0 lb

## 2012-08-12 DIAGNOSIS — J209 Acute bronchitis, unspecified: Secondary | ICD-10-CM

## 2012-08-12 MED ORDER — AZITHROMYCIN 250 MG PO TABS: ORAL_TABLET | ORAL | Status: AC

## 2012-08-12 NOTE — Patient Instructions (Addendum)
Nasal saline irrigation/ spray OTC 2-3 times daily. Mucinex, plain or DM, during the day. Complete full coyurse of antibiotics. Call if not improving as expected.

## 2012-08-12 NOTE — Assessment & Plan Note (Signed)
Given >2 weeks symptoms will cover for bacterial bronchittis. Treat with mucinex  and nasal saline as well.

## 2012-08-12 NOTE — Telephone Encounter (Signed)
Caller: Kanden/Patient; Patient Name: Edward [name]; PCP: Crawford Givens Clelia Croft) [facility]); Best Callback Phone Number: [phone]; Calling today 08/12/12 regarding having cough and congestions.  Onset around 07/29/12.  Afebrile.  Sinus pain, congestion, cough, sore throat, headache.  Emergent symptoms ruled out by Upper Respiratory Infection guidelines with exception of mild to moderate headache for more than 24 hours unrelieved with nonprescription medications.  (See Provider Within 24 Hours).  Appointment scheduled for today at 4 PM with Dr. Kerby Nora.

## 2012-08-12 NOTE — Progress Notes (Signed)
  Subjective:    Patient ID: Edward [name], male    DOB: [birth year], 56 y.o.   MRN: [id number]  Cough This is a new problem. The current episode started 1 to 4 weeks ago (2 weeks ago). The cough is productive of sputum. Associated symptoms include nasal congestion, rhinorrhea, a sore throat and shortness of breath. Pertinent negatives include no chills, fever or rash. Nothing aggravates the symptoms. He has tried rest (mucinex) for the symptoms. The treatment provided moderate relief. His past medical history is significant for asthma. There is no history of COPD or emphysema. former smoker x 15 pack year history      Review of Systems  Constitutional: Negative for fever and chills.  HENT: Positive for sore throat and rhinorrhea.   Respiratory: Positive for cough and shortness of breath.   Skin: Negative for rash.       Objective:   Physical Exam  Constitutional: Vital signs are normal. He appears well-developed and well-nourished.  Non-toxic appearance. He does not appear ill. No distress.  HENT:  Head: Normocephalic and atraumatic.  Right Ear: Hearing, tympanic membrane, external ear and ear canal normal. No tenderness. No foreign bodies. Tympanic membrane is not retracted and not bulging.  Left Ear: Hearing, tympanic membrane, external ear and ear canal normal. No tenderness. No foreign bodies. Tympanic membrane is not retracted and not bulging.  Nose: Nose normal. No mucosal edema or rhinorrhea. Right sinus exhibits no maxillary sinus tenderness and no frontal sinus tenderness. Left sinus exhibits no maxillary sinus tenderness and no frontal sinus tenderness.  Mouth/Throat: Uvula is midline, oropharynx is clear and moist and mucous membranes are normal. Normal dentition. No dental caries. No oropharyngeal exudate or tonsillar abscesses.  Eyes: Conjunctivae, EOM and lids are normal. Pupils are equal, round, and reactive to light. No foreign bodies found.  Neck: Trachea normal, normal  range of motion and phonation normal. Neck supple. Carotid bruit is not present. No mass and no thyromegaly present.  Cardiovascular: Normal rate, regular rhythm, S1 normal, S2 normal, normal heart sounds, intact distal pulses and normal pulses.  Exam reveals no gallop.   No murmur heard. Pulmonary/Chest: Effort normal and breath sounds normal. No respiratory distress. He has no wheezes. He has no rhonchi. He has no rales.  Abdominal: Soft. Normal appearance and bowel sounds are normal. There is no hepatosplenomegaly. There is no tenderness. There is no rebound, no guarding and no CVA tenderness. No hernia.  Neurological: He is alert. He has normal reflexes.  Skin: Skin is warm, dry and intact. No rash noted.  Psychiatric: He has a normal mood and affect. His speech is normal and behavior is normal. Judgment normal.          Assessment & Plan:

## 2014-09-21 ENCOUNTER — Encounter: Payer: Self-pay | Admitting: Family Medicine

## 2014-09-21 ENCOUNTER — Ambulatory Visit (INDEPENDENT_AMBULATORY_CARE_PROVIDER_SITE_OTHER): Payer: BC Managed Care – PPO | Admitting: Family Medicine

## 2014-09-21 VITALS — BP 120/78 | HR 82 | Temp 98.2°F | Wt 212.5 lb

## 2014-09-21 DIAGNOSIS — J0100 Acute maxillary sinusitis, unspecified: Secondary | ICD-10-CM

## 2014-09-21 MED ORDER — AMOXICILLIN-POT CLAVULANATE 875-125 MG PO TABS: 1.0000 | ORAL_TABLET | Freq: Two times a day (BID) | ORAL | Status: DC

## 2014-09-21 NOTE — Patient Instructions (Signed)
Start the antibiotics today and use nasal saline a few times a day.  Take care.  Glad to see you.

## 2014-09-21 NOTE — Progress Notes (Signed)
Pre visit review using our clinic review tool, if applicable. No additional management support is needed unless otherwise documented below in the visit note.  Sick for about 2 weeks.  "Woke up with a cold."  Sinus drainage.  Cough at night.  No fevers.  No ear pain now but did have some prev.  Nasal passages are dry.  Some maxillary pain B.  Some sputum, more during the day.  No vomiting, no diarrhea.  Fatigue.  Hadn't been out of work.  Wife was sick recently, but patient was ill first.     H/o similar with weather changes but this is prolonged.   Meds, vitals, and allergies reviewed.   ROS: See HPI.  Otherwise, noncontributory.  GEN: nad, alert and oriented HEENT: mucous membranes moist, tm w/o erythema, nasal exam w/o erythema, clear discharge noted,  OP with cobblestoning, max sinuses ttp B NECK: supple w/o LA CV: rrr.   PULM: ctab, no inc wob EXT: no edema SKIN: no acute rash

## 2014-09-22 DIAGNOSIS — J0100 Acute maxillary sinusitis, unspecified: Secondary | ICD-10-CM | POA: Insufficient documentation

## 2014-09-22 NOTE — Assessment & Plan Note (Signed)
Flu shot to be done at work.  Nontoxic.  Start augmentin and f/u prn.  He agrees. Supportive care o/w.

## 2014-10-08 ENCOUNTER — Ambulatory Visit (INDEPENDENT_AMBULATORY_CARE_PROVIDER_SITE_OTHER): Payer: BC Managed Care – PPO | Admitting: Family Medicine

## 2014-10-08 ENCOUNTER — Encounter: Payer: Self-pay | Admitting: Family Medicine

## 2014-10-08 VITALS — BP 114/66 | HR 80 | Temp 97.8°F | Ht 69.0 in | Wt 208.0 lb

## 2014-10-08 DIAGNOSIS — Z1211 Encounter for screening for malignant neoplasm of colon: Secondary | ICD-10-CM

## 2014-10-08 DIAGNOSIS — R739 Hyperglycemia, unspecified: Secondary | ICD-10-CM

## 2014-10-08 DIAGNOSIS — Z7189 Other specified counseling: Secondary | ICD-10-CM

## 2014-10-08 DIAGNOSIS — Z23 Encounter for immunization: Secondary | ICD-10-CM

## 2014-10-08 DIAGNOSIS — Z0000 Encounter for general adult medical examination without abnormal findings: Secondary | ICD-10-CM

## 2014-10-08 DIAGNOSIS — I2510 Atherosclerotic heart disease of native coronary artery without angina pectoris: Secondary | ICD-10-CM

## 2014-10-08 NOTE — Progress Notes (Signed)
Pre visit review using our clinic review tool, if applicable. No additional management support is needed unless otherwise documented below in the visit note.  CPE- See plan.  Routine anticipatory guidance given to patient.  See health maintenance. Tetanus 2011 Flu shot done 2015.  PNA at 64.  Shingles shot at 59.   Will return for fasting labs.   Prostate cancer screening and PSA options (with potential risks and benefits of testing vs not testing) were discussed along with recent recs/guidelines.  He declined testing PSA at this point. D/w patient Edward [name]:[id number] for colon cancer screening, including IFOB vs. colonoscopy.  Risks and benefits of both were discussed and patient voiced understanding.  Pt elects HEN:IDPO.  Living will.  Would have his wife designated if patient were incapacitated.   Diet and exercise d/w pt. Better with exercise than diet.  Encouraged, "I do good for about a month and than I fall off."    H/o CAD.  No CP, SOB, BLE edema.  Due for f/u with cards.  Ordered referral.  He was hesitant to start any extra meds at this point. At the time of the MI, was smoking and "not taking care of myself."  He had f/u wth [facility] re: HCV prev.  Thought not to be an active infection.   PMH and SH reviewed  Meds, vitals, and allergies reviewed.   ROS: See HPI.  Otherwise negative.    GEN: nad, alert and oriented HEENT: mucous membranes moist NECK: supple w/o LA CV: rrr. PULM: ctab, no inc wob ABD: soft, +bs EXT: no edema SKIN: no acute rash

## 2014-10-08 NOTE — Patient Instructions (Addendum)
Check with your insurance to see if they will cover the shingles shot when you turn 59.  Go to the lab on the way out.  We'll contact you with your lab report. Shirlee Limerick will call about your referral. Take care.  Glad to see you.

## 2014-10-10 DIAGNOSIS — Z1211 Encounter for screening for malignant neoplasm of colon: Secondary | ICD-10-CM | POA: Insufficient documentation

## 2014-10-10 DIAGNOSIS — I2510 Atherosclerotic heart disease of native coronary artery without angina pectoris: Secondary | ICD-10-CM | POA: Insufficient documentation

## 2014-10-10 DIAGNOSIS — Z0000 Encounter for general adult medical examination without abnormal findings: Secondary | ICD-10-CM | POA: Insufficient documentation

## 2014-10-10 DIAGNOSIS — Z7189 Other specified counseling: Secondary | ICD-10-CM | POA: Insufficient documentation

## 2014-10-10 NOTE — Assessment & Plan Note (Signed)
Refer back to cards to re-est care. No CP.  No SOB.

## 2014-10-10 NOTE — Assessment & Plan Note (Signed)
Routine anticipatory guidance given to patient.  See health maintenance. Tetanus 2011 Flu shot done 2015.  PNA at 64.  Shingles shot at 59.   Will return for fasting labs.   Prostate cancer screening and PSA options (with potential risks and benefits of testing vs not testing) were discussed along with recent recs/guidelines.  He declined testing PSA at this point. D/w patient [id number] for colon cancer screening, including IFOB vs. colonoscopy.  Risks and benefits of both were discussed and patient voiced understanding.  Pt elects DJS:HFWY.  Living will.  Would have his wife designated if patient were incapacitated.   Diet and exercise d/w pt. Better with exercise than diet.  Encouraged, "I do good for about a month and than I fall off."

## 2014-10-11 ENCOUNTER — Other Ambulatory Visit (INDEPENDENT_AMBULATORY_CARE_PROVIDER_SITE_OTHER): Payer: BC Managed Care – PPO

## 2014-10-11 DIAGNOSIS — R739 Hyperglycemia, unspecified: Secondary | ICD-10-CM

## 2014-10-11 LAB — COMPREHENSIVE METABOLIC PANEL
ALBUMIN: 3.3 g/dL — AB (ref 3.5–5.2)
ALT: 17 U/L (ref 0–53)
AST: 21 U/L (ref 0–37)
Alkaline Phosphatase: 61 U/L (ref 39–117)
BUN: 18 mg/dL (ref 6–23)
CALCIUM: 9.2 mg/dL (ref 8.4–10.5)
CHLORIDE: 107 meq/L (ref 96–112)
CO2: 27 mEq/L (ref 19–32)
Creatinine, Ser: 1.6 mg/dL — ABNORMAL HIGH (ref 0.4–1.5)
GFR: 47.94 mL/min — ABNORMAL LOW (ref >60.00)
GLUCOSE: 105 mg/dL — AB (ref 70–99)
POTASSIUM: 4.7 meq/L (ref 3.5–5.1)
Sodium: 140 mEq/L (ref 135–145)
Total Bilirubin: 0.6 mg/dL (ref 0.2–1.2)
Total Protein: 6.5 g/dL (ref 6.0–8.3)

## 2014-10-11 LAB — LIPID PANEL
CHOLESTEROL: 171 mg/dL (ref 0–200)
HDL: 33.3 mg/dL — ABNORMAL LOW (ref >39.00)
LDL Cholesterol: 123 mg/dL — ABNORMAL HIGH (ref 0–99)
NonHDL: 137.7
TRIGLYCERIDES: 73 mg/dL (ref 0.0–149.0)
Total CHOL/HDL Ratio: 5
VLDL: 14.6 mg/dL (ref 0.0–40.0)

## 2014-10-13 ENCOUNTER — Encounter: Payer: Self-pay | Admitting: Family Medicine

## 2014-10-13 DIAGNOSIS — R739 Hyperglycemia, unspecified: Secondary | ICD-10-CM | POA: Insufficient documentation

## 2014-10-13 DIAGNOSIS — E785 Hyperlipidemia, unspecified: Secondary | ICD-10-CM | POA: Insufficient documentation

## 2014-10-14 ENCOUNTER — Other Ambulatory Visit: Payer: BC Managed Care – PPO

## 2014-10-14 ENCOUNTER — Other Ambulatory Visit (INDEPENDENT_AMBULATORY_CARE_PROVIDER_SITE_OTHER): Payer: BC Managed Care – PPO

## 2014-10-14 DIAGNOSIS — Z1211 Encounter for screening for malignant neoplasm of colon: Secondary | ICD-10-CM

## 2014-10-15 LAB — FECAL OCCULT BLOOD, IMMUNOCHEMICAL: Fecal Occult Bld: NEGATIVE

## 2014-10-18 ENCOUNTER — Encounter: Payer: Self-pay | Admitting: *Deleted

## 2014-11-22 ENCOUNTER — Ambulatory Visit: Payer: BC Managed Care – PPO | Admitting: Cardiovascular Disease

## 2015-12-25 ENCOUNTER — Other Ambulatory Visit: Payer: Self-pay | Admitting: Family Medicine

## 2015-12-25 DIAGNOSIS — I2510 Atherosclerotic heart disease of native coronary artery without angina pectoris: Secondary | ICD-10-CM

## 2015-12-27 ENCOUNTER — Encounter: Payer: Self-pay | Admitting: Family Medicine

## 2015-12-27 ENCOUNTER — Other Ambulatory Visit (INDEPENDENT_AMBULATORY_CARE_PROVIDER_SITE_OTHER): Payer: BC Managed Care – PPO

## 2015-12-27 ENCOUNTER — Ambulatory Visit (INDEPENDENT_AMBULATORY_CARE_PROVIDER_SITE_OTHER): Payer: BC Managed Care – PPO | Admitting: Family Medicine

## 2015-12-27 VITALS — BP 122/78 | HR 77 | Temp 98.0°F | Ht 69.0 in | Wt 222.0 lb

## 2015-12-27 DIAGNOSIS — Z0000 Encounter for general adult medical examination without abnormal findings: Secondary | ICD-10-CM

## 2015-12-27 DIAGNOSIS — R739 Hyperglycemia, unspecified: Secondary | ICD-10-CM

## 2015-12-27 DIAGNOSIS — I2510 Atherosclerotic heart disease of native coronary artery without angina pectoris: Secondary | ICD-10-CM

## 2015-12-27 DIAGNOSIS — R768 Other specified abnormal immunological findings in serum: Secondary | ICD-10-CM

## 2015-12-27 DIAGNOSIS — I252 Old myocardial infarction: Secondary | ICD-10-CM

## 2015-12-27 DIAGNOSIS — Z1211 Encounter for screening for malignant neoplasm of colon: Secondary | ICD-10-CM

## 2015-12-27 LAB — LIPID PANEL
CHOLESTEROL: 190 mg/dL (ref 0–200)
HDL: 37.2 mg/dL — ABNORMAL LOW (ref >39.00)
LDL Cholesterol: 136 mg/dL — ABNORMAL HIGH (ref 0–99)
NonHDL: 152.36
TRIGLYCERIDES: 83 mg/dL (ref 0.0–149.0)
Total CHOL/HDL Ratio: 5
VLDL: 16.6 mg/dL (ref 0.0–40.0)

## 2015-12-27 LAB — COMPREHENSIVE METABOLIC PANEL
ALBUMIN: 4.2 g/dL (ref 3.5–5.2)
ALK PHOS: 64 U/L (ref 39–117)
ALT: 25 U/L (ref 0–53)
AST: 22 U/L (ref 0–37)
BILIRUBIN TOTAL: 0.7 mg/dL (ref 0.2–1.2)
BUN: 18 mg/dL (ref 6–23)
CALCIUM: 9.3 mg/dL (ref 8.4–10.5)
CO2: 29 mEq/L (ref 19–32)
CREATININE: 1.61 mg/dL — AB (ref 0.40–1.50)
Chloride: 106 mEq/L (ref 96–112)
GFR: 46.72 mL/min — ABNORMAL LOW (ref >60.00)
Glucose, Bld: 109 mg/dL — ABNORMAL HIGH (ref 70–99)
Potassium: 3.9 mEq/L (ref 3.5–5.1)
Sodium: 142 mEq/L (ref 135–145)
Total Protein: 7.2 g/dL (ref 6.0–8.3)

## 2015-12-27 NOTE — Progress Notes (Signed)
Pre visit review using our clinic review tool, if applicable. No additional management support is needed unless otherwise documented below in the visit note.  CPE- See plan.  Routine anticipatory guidance given to patient.  See health maintenance. Tetanus 2011 Flu shot done 09/21/2015 at work.   PNA at 64.  Shingles shot d/w pt.   Prostate cancer screening and PSA options (with potential risks and benefits of testing vs not testing) were discussed along with recent recs/guidelines. He declined testing PSA at this point. D/w patient [id number] for colon cancer screening, including IFOB vs. colonoscopy. Risks and benefits of both were discussed and patient voiced understanding. Pt elects QVZ:DGLO.  Living will. Would have his wife designated if patient were incapacitated.  Diet and exercise d/w pt. Better with exercise than diet. Encouraged to stick with diet.   HIV testing prev done, neg, at [facility], as recently as 2009.    H/o CAD. No CP, SOB, BLE edema. Due for f/u with cards- this didn't happen prev.  He was hesitant to start any extra meds at this point. At the time of the MI, was smoking and "not taking care of myself."  He had f/u wth [facility] re: HCV prev. Thought not to be an active infection.  He never had/needed treatment.  PMH and SH reviewed  Meds, vitals, and allergies reviewed.   ROS: See HPI.  Otherwise negative.    GEN: nad, alert and oriented HEENT: mucous membranes moist NECK: supple w/o LA CV: rrr. PULM: ctab, no inc wob ABD: soft, +bs EXT: no edema SKIN: no acute rash

## 2015-12-27 NOTE — Patient Instructions (Addendum)
Revonda Standard will call about your referral to the heart clinic.  Go to the lab on the way out.  We'll contact you with your lab report.  Check with your insurance to see if they will cover the shingles shot. Use the low carb diet in the meantime.  Take care.  Glad to see you.  Recheck in about 1 year.

## 2015-12-28 NOTE — Assessment & Plan Note (Signed)
He had f/u wth [facility] re: HCV prev. Thought not to be an active infection. He never had/needed treatment.

## 2015-12-28 NOTE — Assessment & Plan Note (Signed)
Labs d/w pt.  LDL not at goal.  Needs work on diet and exercise. No CP, SOB, BLE edema. Due for f/u with cards- this didn't happen prev. He was hesitant to start any extra meds at this point. At the time of the MI, was smoking and "not taking care of myself."  I'll appreciate cards input.

## 2015-12-28 NOTE — Assessment & Plan Note (Signed)
Tetanus 2011  Flu shot done 09/21/2015 at work.  PNA at 64.  Shingles shot d/w pt.  Prostate cancer screening and PSA options (with potential risks and benefits of testing vs not testing) were discussed along with recent recs/guidelines. He declined testing PSA at this point.  D/w patient [id number] for colon cancer screening, including IFOB vs. colonoscopy. Risks and benefits of both were discussed and patient voiced understanding. Pt elects [id number].  Living will. Would have his wife designated if patient were incapacitated.  Diet and exercise d/w pt. Better with exercise than diet. Encouraged to stick with diet.  HIV testing prev done, neg, at [facility], as recently as 2009.  He had f/u wth [facility] re: HCV prev. Thought not to be an active infection. He never had/needed treatment.

## 2015-12-28 NOTE — Assessment & Plan Note (Signed)
Diet and exercise d/w pt.  Gave/discussed handout re: low carb diet.  He agrees.

## 2016-01-05 ENCOUNTER — Other Ambulatory Visit (INDEPENDENT_AMBULATORY_CARE_PROVIDER_SITE_OTHER): Payer: BC Managed Care – PPO

## 2016-01-05 DIAGNOSIS — Z1211 Encounter for screening for malignant neoplasm of colon: Secondary | ICD-10-CM | POA: Diagnosis not present

## 2016-01-05 LAB — FECAL OCCULT BLOOD, IMMUNOCHEMICAL: Fecal Occult Bld: NEGATIVE

## 2016-01-06 ENCOUNTER — Encounter: Payer: Self-pay | Admitting: *Deleted

## 2016-02-08 ENCOUNTER — Ambulatory Visit: Payer: BC Managed Care – PPO | Admitting: Cardiology

## 2017-09-04 LAB — LAB REPORT - SCANNED
Prostate Specific Ag, Serum: 4.4
Testosterone: 221

## 2017-09-20 ENCOUNTER — Encounter: Payer: Self-pay | Admitting: Family Medicine

## 2017-09-20 ENCOUNTER — Ambulatory Visit (INDEPENDENT_AMBULATORY_CARE_PROVIDER_SITE_OTHER): Payer: BC Managed Care – PPO | Admitting: Family Medicine

## 2017-09-20 VITALS — BP 122/78 | HR 70 | Temp 98.4°F | Ht 69.0 in | Wt 203.1 lb

## 2017-09-20 DIAGNOSIS — R739 Hyperglycemia, unspecified: Secondary | ICD-10-CM

## 2017-09-20 DIAGNOSIS — R972 Elevated prostate specific antigen [PSA]: Secondary | ICD-10-CM

## 2017-09-20 DIAGNOSIS — E785 Hyperlipidemia, unspecified: Secondary | ICD-10-CM | POA: Diagnosis not present

## 2017-09-20 LAB — COMPREHENSIVE METABOLIC PANEL
ALBUMIN: 4.3 g/dL (ref 3.5–5.2)
ALT: 16 U/L (ref 0–53)
AST: 17 U/L (ref 0–37)
Alkaline Phosphatase: 57 U/L (ref 39–117)
BUN: 16 mg/dL (ref 6–23)
CALCIUM: 8.9 mg/dL (ref 8.4–10.5)
CHLORIDE: 108 meq/L (ref 96–112)
CO2: 29 mEq/L (ref 19–32)
Creatinine, Ser: 1.51 mg/dL — ABNORMAL HIGH (ref 0.40–1.50)
GFR: 50.02 mL/min — ABNORMAL LOW (ref >60.00)
Glucose, Bld: 121 mg/dL — ABNORMAL HIGH (ref 70–99)
POTASSIUM: 4.6 meq/L (ref 3.5–5.1)
Sodium: 143 mEq/L (ref 135–145)
Total Bilirubin: 0.7 mg/dL (ref 0.2–1.2)
Total Protein: 6.7 g/dL (ref 6.0–8.3)

## 2017-09-20 LAB — LIPID PANEL
CHOLESTEROL: 182 mg/dL (ref 0–200)
HDL: 35.4 mg/dL — AB (ref >39.00)
LDL CALC: 133 mg/dL — AB (ref 0–99)
NonHDL: 147.07
TRIGLYCERIDES: 72 mg/dL (ref 0.0–149.0)
Total CHOL/HDL Ratio: 5
VLDL: 14.4 mg/dL (ref 0.0–40.0)

## 2017-09-20 LAB — PSA: PSA: 5.54 ng/mL — ABNORMAL HIGH (ref 0.10–4.00)

## 2017-09-20 NOTE — Progress Notes (Signed)
Had seen outside clinic, low T and elevated PSA.  Per patient, there was no discussion re: labs prior to today.    No FH prostate cancer.  No LUTS, dw pt.    PSA testing d/w pt in general.    Meds, vitals, and allergies reviewed.   ROS: Per HPI unless specifically indicated in ROS section   GEN: nad, alert and oriented HEENT: mucous membranes moist NECK: supple w/o LA CV: rrr. PULM: ctab, no inc wob ABD: soft, +bs EXT: no edema DRE declined by patient.

## 2017-09-20 NOTE — Patient Instructions (Addendum)
Call about a follow up with cardiology.  Let me know if you need help getting an appointment.  Go to the lab on the way out.  We'll contact you with your lab report. Take care.  Glad to see you.  I'll see you at the physical.

## 2017-09-22 ENCOUNTER — Encounter: Payer: Self-pay | Admitting: Family Medicine

## 2017-09-22 DIAGNOSIS — R972 Elevated prostate specific antigen [PSA]: Secondary | ICD-10-CM | POA: Insufficient documentation

## 2017-09-22 NOTE — Assessment & Plan Note (Addendum)
Prev labs d/w pt, with PSA elevation.   No FH prostate cancer.  No LUTS, dw pt.   PSA testing d/w pt in general.   Recheck PSA today.   DRE declined patient.  See notes on labs.  Recheck elevated, refer to urology.    Checked routine labs in anticipation of CPE later this year.   Asked patient to call about f/u with cardiology given his hx, but not CP or SOB now.

## 2017-09-22 NOTE — Assessment & Plan Note (Signed)
Noted on labs, will ask for A1c on next set of labs.  See notes on labs.

## 2017-09-24 ENCOUNTER — Encounter: Payer: Self-pay | Admitting: Family Medicine

## 2017-10-11 ENCOUNTER — Other Ambulatory Visit: Payer: BC Managed Care – PPO

## 2017-10-18 ENCOUNTER — Encounter: Payer: BC Managed Care – PPO | Admitting: Family Medicine

## 2017-11-08 LAB — LAB REPORT - SCANNED: PROSTATE SPECIFIC AG, SERUM: 4.6

## 2017-11-15 ENCOUNTER — Encounter: Payer: Self-pay | Admitting: Family Medicine

## 2017-11-15 ENCOUNTER — Ambulatory Visit (INDEPENDENT_AMBULATORY_CARE_PROVIDER_SITE_OTHER): Payer: BC Managed Care – PPO | Admitting: Family Medicine

## 2017-11-15 VITALS — BP 122/76 | HR 87 | Temp 97.5°F | Ht 69.0 in | Wt 212.8 lb

## 2017-11-15 DIAGNOSIS — E785 Hyperlipidemia, unspecified: Secondary | ICD-10-CM

## 2017-11-15 DIAGNOSIS — R739 Hyperglycemia, unspecified: Secondary | ICD-10-CM

## 2017-11-15 DIAGNOSIS — Z7189 Other specified counseling: Secondary | ICD-10-CM

## 2017-11-15 DIAGNOSIS — Z0000 Encounter for general adult medical examination without abnormal findings: Secondary | ICD-10-CM | POA: Diagnosis not present

## 2017-11-15 DIAGNOSIS — R972 Elevated prostate specific antigen [PSA]: Secondary | ICD-10-CM

## 2017-11-15 DIAGNOSIS — Z1211 Encounter for screening for malignant neoplasm of colon: Secondary | ICD-10-CM

## 2017-11-15 LAB — HEMOGLOBIN A1C: Hgb A1c MFr Bld: 5.9 % (ref 4.6–6.5)

## 2017-11-15 NOTE — Patient Instructions (Signed)
We'll contact you with your lab report about your sugar.  Send in the stool cards.  I'll get your records from urology.  Try to work on your weight in the meantime.  Think about/consider starting a statin (like lipitor or pravastatin).  It may help protect your heart.   Take care.  Glad to see you.

## 2017-11-15 NOTE — Progress Notes (Signed)
CPE- See plan.  Routine anticipatory guidance given to patient.  See health maintenance.  The possibility exists that previously documented standard health maintenance information may have been brought forward from a previous encounter into this note.  If needed, that same information has been updated to reflect the current situation based on today's encounter.    Tetanus 2011  Flu shot done 09/2017 PNA at 64.  Shingles shot d/w pt.  Currently unavailable.   PSA elevation noted with prev eval done at [facility] and requesting records.   D/w patient [id number] for colon cancer screening, including IFOB vs. colonoscopy. Risks and benefits of both were discussed and patient voiced understanding. Pt elects [id number].  Living will d/w pt. Would have his wife designated if patient were incapacitated.  Diet and exercise d/w pt. Better with exercise than diet. Encouraged to stick with diet.   HIV testing prev done, neg, at [facility], as recently as 2009.  He had f/u wth [facility] re: HCV prev. Thought not to be an active infection. He never had/needed treatment.  Hyperlipidemia discussed with patient.  Labs discussed with patient.  Rationale for statin use discussed with patient.  He will consider.  Hyperglycemia discussed with patient.  A1c pending.  See notes on labs.  Discussed with patient.  Defer testosterone replacement.  Discussed with patient about this- he was asking about tx.  Given his lipid situation, his sugar, and his previous PSA elevation it would not be prudent to start testosterone replacement at this point.  Rationale and risks discussed with patient.  PMH and SH reviewed  Meds, vitals, and allergies reviewed.   ROS: Per HPI.  Unless specifically indicated otherwise in HPI, the patient denies:  General: fever. Eyes: acute vision changes ENT: sore throat Cardiovascular: chest pain Respiratory: SOB GI: vomiting GU: dysuria Musculoskeletal: acute back pain Derm: acute rash Neuro:  acute motor dysfunction Psych: worsening mood Endocrine: polydipsia Heme: bleeding Allergy: hayfever  GEN: nad, alert and oriented HEENT: mucous membranes moist NECK: supple w/o LA CV: rrr. PULM: ctab, no inc wob ABD: soft, +bs EXT: no edema SKIN: no acute rash

## 2017-11-20 NOTE — Assessment & Plan Note (Signed)
See notes on labs. 

## 2017-11-20 NOTE — Assessment & Plan Note (Signed)
PSA elevation noted with prev eval done at [facility] and requesting records.

## 2017-11-20 NOTE — Assessment & Plan Note (Signed)
Tetanus 2011  Flu shot done 09/2017 PNA at 64.  Shingles shot d/w pt.  Currently unavailable.   PSA elevation noted with prev eval done at [facility] and requesting records.   D/w patient [id number] for colon cancer screening, including IFOB vs. colonoscopy. Risks and benefits of both were discussed and patient voiced understanding. Pt elects [id number].  Living will d/w pt. Would have his wife designated if patient were incapacitated.  Diet and exercise d/w pt. Better with exercise than diet. Encouraged to stick with diet.   HIV testing prev done, neg, at [facility], as recently as 2009.  He had f/u wth [facility] re: HCV prev. Thought not to be an active infection. He never had/needed treatment.

## 2017-11-20 NOTE — Assessment & Plan Note (Signed)
Hyperlipidemia discussed with patient.  Labs discussed with patient.  Rationale for statin use discussed with patient.  He will consider.

## 2017-11-20 NOTE — Assessment & Plan Note (Signed)
Living will d/w pt.   Would have his wife designated if patient were incapacitated.  

## 2017-11-25 ENCOUNTER — Other Ambulatory Visit (INDEPENDENT_AMBULATORY_CARE_PROVIDER_SITE_OTHER): Payer: BC Managed Care – PPO

## 2017-11-25 DIAGNOSIS — Z1211 Encounter for screening for malignant neoplasm of colon: Secondary | ICD-10-CM | POA: Diagnosis not present

## 2017-11-25 LAB — FECAL OCCULT BLOOD, IMMUNOCHEMICAL: FECAL OCCULT BLD: NEGATIVE

## 2017-12-16 ENCOUNTER — Telehealth: Payer: Self-pay | Admitting: Family Medicine

## 2017-12-16 NOTE — Telephone Encounter (Signed)
Late entry.  I had talked to urology staff re: patient and PSA.  The plan is for [facility] to contact the patient for 3 month follow up, meaning 3 month f/u from last urology OV.  This is to be schedule per uro.  I'll defer to urology, with appreciation.

## 2017-12-19 ENCOUNTER — Encounter: Payer: Self-pay | Admitting: Family Medicine

## 2018-11-11 ENCOUNTER — Telehealth: Payer: Self-pay | Admitting: Family Medicine

## 2018-11-11 DIAGNOSIS — I2510 Atherosclerotic heart disease of native coronary artery without angina pectoris: Secondary | ICD-10-CM

## 2018-11-11 NOTE — Telephone Encounter (Signed)
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 Please advise. Pt stated it didn't matter which office he just need it to be done ASAP

## 2018-11-12 NOTE — Telephone Encounter (Signed)
I put in the referral.  I don't know when they will be able to see him.   Please tell him in the nicest way possible that calling back in will not speed up the process.  I had asked him years ago to follow up with cardiology.  Thanks.

## 2018-11-12 NOTE — Telephone Encounter (Signed)
Pt called back checking on referral for cardology  Best number [phone]

## 2018-11-12 NOTE — Telephone Encounter (Signed)
Patient notified as instructed by telephone and verbalized understanding.  Advised patient that one of the referral coordinators will be in touch with him to get this set up. 

## 2018-11-12 NOTE — Telephone Encounter (Signed)
Left message for patient to call back  

## 2018-12-04 ENCOUNTER — Encounter: Payer: Self-pay | Admitting: Cardiology

## 2018-12-31 ENCOUNTER — Encounter: Payer: Self-pay | Admitting: Cardiology

## 2018-12-31 ENCOUNTER — Ambulatory Visit: Payer: BC Managed Care – PPO | Admitting: Cardiology

## 2018-12-31 VITALS — BP 116/78 | HR 66 | Ht 69.0 in | Wt 206.1 lb

## 2018-12-31 DIAGNOSIS — E7800 Pure hypercholesterolemia, unspecified: Secondary | ICD-10-CM

## 2018-12-31 DIAGNOSIS — I2510 Atherosclerotic heart disease of native coronary artery without angina pectoris: Secondary | ICD-10-CM

## 2018-12-31 MED ORDER — ASPIRIN 81 MG PO TABS: 81.0000 mg | ORAL_TABLET | Freq: Every day | ORAL | Status: DC

## 2018-12-31 NOTE — Patient Instructions (Signed)
Medication Instructions:  Please take Asprin 81 mg daily. Consider taking a statin for treatment of your cholesterol.  Discuss with your primary care doctor for further recommendation. Continue all order medications as listed.  Follow-Up: Follow up with Dr Anne Fu as needed.  Thank you for choosing [facility]!!

## 2018-12-31 NOTE — Progress Notes (Signed)
Cardiology Office Note:    Date:  12/31/2018   ID:  Edward [name], DOB 03/27/1956, MRN [id number]  PCP:  Edward Nam, MD  Cardiologist:  Donato Schultz, MD  Electrophysiologist:  None   Referring MD: Edward Nam, MD     History of Present Illness:    Edward [name] is a 63 y.o. male here for the evaluation of coronary artery disease at the request of Dr. Para [name].  He was last seen by Dr. Shirlee [name] in 2011.  Has coronary artery disease status post ST elevation myocardial infarction with drug-eluting stent x2 occluded circumflex and 80% mid RCA, Taxus in 2004.  EF 50 to 55%.  Doing well since MI.  Previously beta-blocker caused fatigue.  He also was on Lipitor then Crestor for about 6 months after the heart attack but felt like they cause fatigue so he stopped them.  He has been counseled in the past by Dr. Para [name] as well on the benefits of statin.  [employer] representative.  History of hepatitis C antibody positive prior evaluation [facility] hepatology.  Currently feels well.  No chest pain fevers chills nausea vomiting shortness of breath.  No syncope.  No anginal symptoms.  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   Past Medical History:  Diagnosis Date  . CAD (coronary artery disease)   . HCV antibody positive    previous eval at [facility] hepatology clinic  . STEMI (ST elevation myocardial infarction) (HCC) 2004   with totally occluded CFX (infarct related artery) and 80% mRCA stenosis. Taxus DES to both vessles. EF was 50-55% by LV-gram    Past Surgical History:  Procedure Laterality Date  . CORONARY ANGIOPLASTY WITH STENT PLACEMENT  08/22/03    Current Medications: Current Meds  Medication Sig  . aspirin 81 MG tablet Take 1 tablet (81 mg total) by mouth daily.  . [DISCONTINUED] aspirin 325 MG tablet Take 325 mg by mouth daily as needed.      Allergies:   Patient has no known allergies.   Social History   Socioeconomic History  . Marital status: Married    Spouse  name: Not on file  . Number of children: 3  . Years of education: Not on file  . Highest education level: Not on file  Occupational History  . Occupation: Training and development officer  . Financial resource strain: Not on file  . Food insecurity:    Worry: Not on file    Inability: Not on file  . Transportation needs:    Medical: Not on file    Non-medical: Not on file  Tobacco Use  . Smoking status: Former Smoker    Last attempt to quit: 12/11/2003    Years since quitting: 15.0  . Smokeless tobacco: Never Used  Substance and Sexual Activity  . Alcohol use: No  . Drug use: No  . Sexual activity: Not on file  Lifestyle  . Physical activity:    Days per week: Not on file    Minutes per session: Not on file  . Stress: Not on file  Relationships  . Social connections:    Talks on phone: Not on file    Gets together: Not on file    Attends religious service: Not on file    Active member of club or organization: Not on file    Attends meetings of clubs or organizations: Not on file    Relationship status: Not on file  Other Topics Concern  .  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 There is no history of Colon cancer or Prostate cancer.  Father: dec 42 Aneurysm of the brain, stroke Mother: A  ASCVD, HTN Brother A   79  MI- self, uncles Two of his children have diabetes   ROS:   Please see the history of present illness.     All other systems reviewed and are negative.  EKGs/Labs/Other Studies Reviewed:    The following studies were reviewed today: Prior office notes, cardiac catheterization, EKG  EKG:  EKG is   ordered today.  The ekg ordered today demonstrates sinus rhythm 66 with PVC, T wave inversion noted once again in the lateral leads, slight widening of QRS.  Poor R wave progression noted.  Personally reviewed and interpreted-prior EKG shows inferior and anterolateral T wave inversions.  Normal sinus rhythm.   Recent Labs: No results found for requested labs within last 8760 hours.  Recent Lipid Panel    Component Value Date/Time   CHOL 182 09/20/2017 0847   TRIG 72.0 09/20/2017 0847   HDL 35.40 (L) 09/20/2017 0847   CHOLHDL 5 09/20/2017 0847   VLDL 14.4 09/20/2017 0847   LDLCALC 133 (H) 09/20/2017 0847    Physical Exam:    VS:  BP 116/78   Pulse 66   Ht 5\' 9"  (1.753 m)   Wt 206 lb 1.9 oz (93.5 kg)   SpO2 97%   BMI 30.44 kg/m     Wt Readings from Last 3 Encounters:  12/31/18 206 lb 1.9 oz (93.5 kg)  11/15/17 212 lb 12 oz (96.5 kg)  09/20/17 203 lb 1.9 oz (92.1 kg)     GEN:  Well nourished, well developed in no acute distress HEENT: Normal NECK: No JVD; No carotid bruits LYMPHATICS: No lymphadenopathy CARDIAC: RRR, no murmurs, rubs, gallops RESPIRATORY:  Clear to auscultation without rales, wheezing or rhonchi  ABDOMEN: Soft, non-tender, non-distended MUSCULOSKELETAL:  No edema; No deformity  SKIN: Warm and dry NEUROLOGIC:  Alert and oriented x 3 PSYCHIATRIC:  Normal affect   ASSESSMENT:    1. Coronary artery disease involving native coronary artery of native heart without angina pectoris   2. Pure hypercholesterolemia    PLAN:    In order of problems listed above:  Coronary artery disease status post ST elevation MI 08/22/2003 - Circumflex, culprit vessel Taxus stent -Mid RCA Taxus stent -EF 50% at the time. -Recommendation is for him to at least take an aspirin 81 mg once a day.  We also stated that it would not be unreasonable for him to take instead of aspirin, Plavix 75 mg once a day given his early generation drug-eluting stent.  He is however willing to  take the aspirin 81 mg once a day.  Hyperlipidemia - We would like LDL less than 70.  He has been in the past intolerant to Crestor and atorvastatin.  Worried about side effects, fatigue.  Pravastatin was started previously in 2011 to see if he would tolerate.  I do not believe that he is been taking any statin for quite some time.  LDL 133.  I strongly encourage that he go on high intensity statin therapy.  We explained the benefits of plaque stabilization and how anyone post myocardial infarction should be on this medication.  He would like to think about this.  He states that Dr. Para [name] has encouraged this as well.  My recommendations would be to try Crestor 20 mg once a day again.  Hopefully he will not have any fatiguing side effects with this medication again.  It is been several years since he is tried.  Overall doing well.   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  He has had no high risk symptoms such as angina, shortness of breath, syncope.  EKG is stable.  He may follow-up with [place] on as-needed basis.   Medication Adjustments/Labs and Tests Ordered: Current medicines are reviewed at length with the patient today.  Concerns regarding medicines are outlined above.  Orders Placed This Encounter  Procedures  . EKG 12-Lead   Meds ordered this encounter  Medications  . aspirin 81 MG tablet    Sig: Take 1 tablet (81 mg total) by mouth daily.    Patient Instructions  Medication Instructions:  Please take Asprin 81 mg daily. Consider taking a statin for treatment of your cholesterol.  Discuss with your primary care doctor for further recommendation. Continue all order medications as listed.  Follow-Up: Follow up with Dr Anne Fu as needed.  Thank you for choosing [facility]!!        Signed, Donato Schultz, MD  12/31/2018 9:15 AM    [facility]

## 2019-01-23 ENCOUNTER — Other Ambulatory Visit: Payer: Self-pay | Admitting: Family Medicine

## 2019-01-23 ENCOUNTER — Other Ambulatory Visit (INDEPENDENT_AMBULATORY_CARE_PROVIDER_SITE_OTHER): Payer: BC Managed Care – PPO

## 2019-01-23 DIAGNOSIS — I2510 Atherosclerotic heart disease of native coronary artery without angina pectoris: Secondary | ICD-10-CM | POA: Diagnosis not present

## 2019-01-23 DIAGNOSIS — R972 Elevated prostate specific antigen [PSA]: Secondary | ICD-10-CM

## 2019-01-23 LAB — COMPREHENSIVE METABOLIC PANEL
ALBUMIN: 4.1 g/dL (ref 3.5–5.2)
ALT: 18 U/L (ref 0–53)
AST: 18 U/L (ref 0–37)
Alkaline Phosphatase: 66 U/L (ref 39–117)
BILIRUBIN TOTAL: 0.4 mg/dL (ref 0.2–1.2)
BUN: 20 mg/dL (ref 6–23)
CO2: 28 mEq/L (ref 19–32)
Calcium: 9.1 mg/dL (ref 8.4–10.5)
Chloride: 107 mEq/L (ref 96–112)
Creatinine, Ser: 1.41 mg/dL (ref 0.40–1.50)
GFR: 50.71 mL/min — ABNORMAL LOW (ref >60.00)
Glucose, Bld: 106 mg/dL — ABNORMAL HIGH (ref 70–99)
Potassium: 4.2 mEq/L (ref 3.5–5.1)
SODIUM: 143 meq/L (ref 135–145)
Total Protein: 6.3 g/dL (ref 6.0–8.3)

## 2019-01-23 LAB — LIPID PANEL
Cholesterol: 155 mg/dL (ref 0–200)
HDL: 38.1 mg/dL — ABNORMAL LOW (ref >39.00)
LDL Cholesterol: 105 mg/dL — ABNORMAL HIGH (ref 0–99)
NonHDL: 117.33
Total CHOL/HDL Ratio: 4
Triglycerides: 62 mg/dL (ref 0.0–149.0)
VLDL: 12.4 mg/dL (ref 0.0–40.0)

## 2019-01-23 LAB — PSA: PSA: 4.2 ng/mL — ABNORMAL HIGH (ref 0.10–4.00)

## 2019-01-30 ENCOUNTER — Encounter: Payer: BC Managed Care – PPO | Admitting: Family Medicine

## 2019-02-03 ENCOUNTER — Ambulatory Visit (INDEPENDENT_AMBULATORY_CARE_PROVIDER_SITE_OTHER): Payer: BC Managed Care – PPO | Admitting: Family Medicine

## 2019-02-03 ENCOUNTER — Encounter: Payer: Self-pay | Admitting: Family Medicine

## 2019-02-03 VITALS — BP 110/64 | HR 72 | Temp 98.3°F | Ht 69.0 in | Wt 207.5 lb

## 2019-02-03 DIAGNOSIS — R739 Hyperglycemia, unspecified: Secondary | ICD-10-CM

## 2019-02-03 DIAGNOSIS — Z0000 Encounter for general adult medical examination without abnormal findings: Secondary | ICD-10-CM

## 2019-02-03 DIAGNOSIS — E785 Hyperlipidemia, unspecified: Secondary | ICD-10-CM

## 2019-02-03 DIAGNOSIS — R972 Elevated prostate specific antigen [PSA]: Secondary | ICD-10-CM

## 2019-02-03 DIAGNOSIS — M25562 Pain in left knee: Secondary | ICD-10-CM

## 2019-02-03 DIAGNOSIS — Z7189 Other specified counseling: Secondary | ICD-10-CM

## 2019-02-03 DIAGNOSIS — I2510 Atherosclerotic heart disease of native coronary artery without angina pectoris: Secondary | ICD-10-CM

## 2019-02-03 MED ORDER — ASPIRIN 81 MG PO TABS: 81.0000 mg | ORAL_TABLET | Freq: Every day | ORAL | Status: DC

## 2019-02-03 NOTE — Patient Instructions (Addendum)
Check on coverage for cologuard.  If you want that, then we'll set it up.  If not, then we'll get you the regular stool cards.  Either way, let me know what you want.  Keep icing your knee.  Update me as needed.  Your knee still feels solid.  I would start a statin, to see if you can tolerate it.  Let me know if you want to start.  Take care.  Glad to see you.

## 2019-02-03 NOTE — Progress Notes (Signed)
CPE- See plan.  Routine anticipatory guidance given to patient.  See health maintenance.  The possibility exists that previously documented standard health maintenance information may have been brought forward from a previous encounter into this note.  If needed, that same information has been updated to reflect the current situation based on today's encounter.    Tetanus 2011  Flu shot done 09/2018 PNA at 64.  Shingles shot d/w pt.  Currently unavailable.   PSA elevation d/w pt.  PSA lower than prev, still w/o LUTS.  D/w patient [id number] for colon cancer screening, including IFOB vs. colonoscopy. Risks and benefits of both were discussed and patient voiced understanding. Pt elects for colougard.  See avs.  Living will d/w pt. Would have his wife designated if patient were incapacitated.  Diet and exercise d/w pt. Better with exercise than diet. Encouraged to stick with diet. HIV testing prev done, neg, at [facility], as recently as 2009.  He had f/u wth [facility] re: HCV prev. Thought not to be an active infection. He never had/needed treatment.  Hyperlipidemia discussed with patient.  Labs discussed with patient.  Rationale for statin use discussed with patient.  He will consider. He started taking aspirin in the meantime.    CAD hx d/w pt.  No CP, SOB, BLE edema.   Hyperglycemia discussed with patient.  L knee pain and swelling better in the last 2 weeks.  "Twisted" his knee about 4 months ago, with a missed step. No locking.  No pain squatting but pain crawling. Using a reinforced sleeve didn't help.  No pain walking now.  He is better in the meantime.   PMH and SH reviewed  Meds, vitals, and allergies reviewed.   ROS: Per HPI.  Unless specifically indicated otherwise in HPI, the patient denies:  General: fever. Eyes: acute vision changes ENT: sore throat Cardiovascular: chest pain Respiratory: SOB GI: vomiting GU: dysuria Musculoskeletal: acute back pain Derm: acute rash Neuro:  acute motor dysfunction Psych: worsening mood Endocrine: polydipsia Heme: bleeding Allergy: hayfever  GEN: nad, alert and oriented HEENT: mucous membranes moist NECK: supple w/o LA CV: rrr. PULM: ctab, no inc wob ABD: soft, +bs EXT: no edema SKIN: no acute rash Left knee is minimally puffy and minimally tender on the medial joint line but not tender laterally.  ACL MCL and LCL all feel solid.  Patella not tender.  No bruising.  No erythema.  Able to bear weight.  No locking or clicking on range of motion.

## 2019-02-05 DIAGNOSIS — M25562 Pain in left knee: Secondary | ICD-10-CM | POA: Insufficient documentation

## 2019-02-05 NOTE — Assessment & Plan Note (Signed)
Tetanus 2011  Flu shot done 09/2018 PNA at 64.  Shingles shot d/w pt.  Currently unavailable.   PSA elevation d/w pt.  PSA lower than prev, still w/o LUTS.  D/w patient [id number] for colon cancer screening, including IFOB vs. colonoscopy. Risks and benefits of both were discussed and patient voiced understanding. Pt elects for colougard.  See avs.  Living will d/w pt. Would have his wife designated if patient were incapacitated.  Diet and exercise d/w pt. Better with exercise than diet. Encouraged to stick with diet. HIV testing prev done, neg, at [facility], as recently as 2009.  He had f/u wth [facility] re: HCV prev. Thought not to be an active infection. He never had/needed treatment.

## 2019-02-05 NOTE — Assessment & Plan Note (Signed)
Encourage statin start.  Rationale discussed with patient.

## 2019-02-05 NOTE — Assessment & Plan Note (Signed)
He does appear to be better in the meantime.  Reasonable to use ice as needed and then update me if worse in the meantime.  He agrees.  See after visit summary.

## 2019-02-05 NOTE — Assessment & Plan Note (Signed)
Diet and exercise discussed with patient °

## 2019-02-05 NOTE — Assessment & Plan Note (Signed)
I will update [facility] as Lorain Childes.  His PSA is improved from previous.  He has no dysuria.  Discussed with patient.

## 2019-02-05 NOTE — Assessment & Plan Note (Signed)
CAD hx d/w pt.  No CP, SOB, BLE edema.  Encourage statin use.  Encouraged aspirin use.  Rationale discussed with patient.

## 2019-02-05 NOTE — Assessment & Plan Note (Signed)
Wife designated if patient were incapacitated.  

## 2019-08-07 ENCOUNTER — Telehealth: Payer: Self-pay

## 2019-08-07 NOTE — Telephone Encounter (Signed)
Pt called in requesting OV with Dr Edward [name] for Left knee pain x 1 year... OV offered w/ Dr Damita Dunnings today at 12pm, pt insisted on seeing Dr Edward [name].... appt scheduled with Dr Edward [name] Monday Aug 31 at 10:20

## 2019-08-09 NOTE — Progress Notes (Addendum)
Edward France T. Verneda Hollopeter, MD Primary Care and Sports Medicine [facility] [place], [address] Phone: [phone]  FAX: [phone]  S[place] Edward [name] - 63 y.o. male  MRN [id number]  Date of Birth: 07-11-1956  Visit Date: 08/10/2019  PCP: Edward Ghent, MD  Referred by: Edward Ghent, MD  Chief Complaint  Patient presents with  . Knee Pain    Left   Subjective:   Edward [name] is a 63 y.o. very pleasant male patient with Body mass index is 32.93 kg/m. who presents with the following:  L sided knee pain:  8 mo. this is the first time he has been evaluated for this.  In October he was sleeping in his truck and then he stepped awkwardly and has had some pain on the medial aspect of the knee ever since then.  He is had some difficulty ambulating.  This is primarily on the medial joint line.  Historically prior to this he has not had that much significant pain.  No prior fractures or operations on the affected knee.  October was sleeping in his truck and then hurting a lot and then he fell.  Since Oct, hardly able to walk.   OA and men tear, med L knee inj  Past Medical History, Surgical History, Social History, Family History, Problem List, Medications, and Allergies have been reviewed and updated if relevant.  Patient Active Problem List   Diagnosis Date Noted  . Left knee pain 02/05/2019  . Elevated PSA 09/22/2017  . Hyperglycemia 10/13/2014  . Hyperlipidemia 10/13/2014  . Coronary atherosclerosis of native coronary artery 10/10/2014  . Routine general medical examination at a health care facility 10/10/2014  . Advance care planning 10/10/2014  . HCV antibody positive 04/30/2008    Past Medical History:  Diagnosis Date  . CAD (coronary artery disease)   . HCV antibody positive    previous eval at [facility] hepatology clinic  . STEMI (ST elevation myocardial infarction) ([place]) 2004   with totally occluded CFX  (infarct related artery) and 80% mRCA stenosis. Taxus DES to both vessles. EF was 50-55% by LV-gram    Past Surgical History:  Procedure Laterality Date  . CORONARY ANGIOPLASTY WITH STENT PLACEMENT  08/22/03    Social History   Socioeconomic History  . Marital status: Married    Spouse name: Not on file  . Number of children: 3  . Years of education: Not on file  . Highest education level: Not on file  Occupational History  . Occupation: Barista  . Financial resource strain: Not on file  . Food insecurity    Worry: Not on file    Inability: Not on file  . Transportation needs    Medical: Not on file    Non-medical: Not on file  Tobacco Use  . Smoking status: Former Smoker    Quit date: 12/11/2003    Years since quitting: 15.6  . Smokeless tobacco: Never Used  Substance and Sexual Activity  . Alcohol use: No  . Drug use: No  . Sexual activity: Not on file  Lifestyle  . Physical activity    Days per week: Not on file    Minutes per session: Not on file  . Stress: Not on file  Relationships  . Social Herbalist on phone: Not on file    Gets together: Not on file    Attends religious service: Not  on file    Active member of club or organization: Not on file    Attends meetings of clubs or organizations: Not on file    Relationship status: Not on file  . Intimate partner violence    Fear of current or ex partner: Not on file    Emotionally abused: Not on file    Physically abused: Not on file    Forced sexual activity: Not on file  Other Topics Concern  . 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 Aneurysm Father        brain  . Stroke Father   . Other Mother        ASCVD  . Hypertension Mother   . Heart attack Other        uncle  . Diabetes Child    . Diabetes Child   . Colon cancer Neg Hx   . Prostate cancer Neg Hx     No Known Allergies  Medication list reviewed and updated in full in [facility].  GEN: No fevers, chills. Nontoxic. Primarily MSK c/o today. MSK: Detailed in the HPI GI: tolerating PO intake without difficulty Neuro: No numbness, parasthesias, or tingling associated. Otherwise the pertinent positives of the ROS are noted above.   Objective:   BP 140/90   Pulse 71   Temp (!) 97.2 F (36.2 C) (Temporal)   Ht 5\' 9"  (1.753 m)   Wt 223 lb (101.2 kg)   SpO2 96%   BMI 32.93 kg/m    GEN: WDWN, NAD, Non-toxic, Alert & Oriented x 3 HEENT: Atraumatic, Normocephalic.  Ears and Nose: No external deformity. EXTR: No clubbing/cyanosis/edema NEURO: Normal gait. Mild limp PSYCH: Normally interactive. Conversant. Not depressed or anxious appearing.  Calm demeanor.    Left knee: Full extension.  Flexion to 115 degrees.  Normal patellar motion with no pain at the facets.  Medial joint line tenderness without lateral joint line tenderness.  ACL, PCL, and MCL are all intact LCL intact.  Flexion pinch and McMurray's cause significant pain without mechanical symptoms.  Bounce home is equivocal.  Radiology: Dg Knee 4 Views W/patella Left  Result Date: 08/10/2019 CLINICAL DATA:  Left knee pain for 8 months EXAM: LEFT KNEE - COMPLETE 4+ VIEW COMPARISON:  None. FINDINGS: No fracture or dislocation of the left knee. There is mild medial compartment joint space narrowing without significant osteophytosis. Small nonspecific knee joint effusion. The soft tissues are unremarkable. The right knee is symmetric and unremarkable in appearance seen on frontal weight-bearing view only. IMPRESSION: No fracture or dislocation of the left knee. There is mild medial compartment joint space narrowing without significant osteophytosis. Small nonspecific knee joint effusion. Electronically Signed   By: Lauralyn Primes M.D.   On: 08/10/2019 12:35      Assessment and Plan:     ICD-10-CM   1. Acute internal derangement of left knee  M23.92   2. Acute pain of left knee  M25.562 DG Knee 4 Views W/Patella Left    methylPREDNISolone acetate (DEPO-MEDROL) injection 80 mg  3. Primary osteoarthritis of left knee  M17.12    8 months of symptoms without improving.  First medical evaluation.  I think it is reasonable to try some basic things and continue  anti-inflammatories, range of motion, and we will try to do an intra-articular injection of some steroids.  There is a reasonable likelihood that this will not work given the length of time.  If symptoms persist without improvement at all, then the next appropriate step would be to get an MRI of the left knee without contrast.  Given clinical scenario and exam, symptomatic medial meniscal tear of 8 months duration with failure of conservative care would be indication for MRI.  Aspiration/Injection Procedure Note Simonne MaffucciRobert Earl Greene [birth year] Date of procedure: 08/10/2019  Procedure: Large Joint Aspiration / Injection of Knee, LEFT Indications: Pain  Procedure Details Patient verbally consented to procedure. Risks (including potential rare risk of infection), benefits, and alternatives explained. Sterilely prepped with Chloraprep. Ethyl cholride used for anesthesia. 8 cc Lidocaine 1% mixed with 2 mL Depo-Medrol 40 mg injected using the anteromedial approach without difficulty. No complications with procedure and tolerated well. Patient had decreased pain post-injection. Medication: 2 mL of Depo-Medrol 40 mg, equaling Depo-Medrol 80 mg total   Follow-up: No follow-ups on file.  Meds ordered this encounter  Medications  . methylPREDNISolone acetate (DEPO-MEDROL) injection 80 mg   Orders Placed This Encounter  Procedures  . DG Knee 4 Views W/Patella Left    Signed,  Governor Matos T. Scarleth Brame, MD   Outpatient Encounter Medications as of 08/10/2019  Medication Sig  . aspirin 81 MG tablet Take  1 tablet (81 mg total) by mouth daily.  . [EXPIRED] methylPREDNISolone acetate (DEPO-MEDROL) injection 80 mg    No facility-administered encounter medications on file as of 08/10/2019.

## 2019-08-10 ENCOUNTER — Other Ambulatory Visit: Payer: Self-pay

## 2019-08-10 ENCOUNTER — Encounter: Payer: Self-pay | Admitting: Family Medicine

## 2019-08-10 ENCOUNTER — Ambulatory Visit (INDEPENDENT_AMBULATORY_CARE_PROVIDER_SITE_OTHER)
Admission: RE | Admit: 2019-08-10 | Discharge: 2019-08-10 | Disposition: A | Discharge status: Home / Self Care | Payer: BC Managed Care – PPO | Source: Ambulatory Visit | Attending: Family Medicine | Admitting: Family Medicine

## 2019-08-10 ENCOUNTER — Ambulatory Visit: Payer: BC Managed Care – PPO | Admitting: Family Medicine

## 2019-08-10 VITALS — BP 140/90 | HR 71 | Temp 97.2°F | Ht 69.0 in | Wt 223.0 lb

## 2019-08-10 DIAGNOSIS — M1712 Unilateral primary osteoarthritis, left knee: Secondary | ICD-10-CM

## 2019-08-10 DIAGNOSIS — M25562 Pain in left knee: Secondary | ICD-10-CM

## 2019-08-10 DIAGNOSIS — M2392 Unspecified internal derangement of left knee: Secondary | ICD-10-CM

## 2019-08-10 MED ORDER — METHYLPREDNISOLONE ACETATE 40 MG/ML IJ SUSP
80.0000 mg | Freq: Once | INTRAMUSCULAR | Status: AC
  Administered 2019-08-10: 12:00:00 80 mg via INTRA_ARTICULAR

## 2020-01-26 ENCOUNTER — Other Ambulatory Visit: Payer: Self-pay

## 2020-01-26 ENCOUNTER — Encounter: Payer: Self-pay | Admitting: Family Medicine

## 2020-01-26 ENCOUNTER — Ambulatory Visit: Payer: BC Managed Care – PPO | Admitting: Family Medicine

## 2020-01-26 VITALS — BP 138/72 | HR 73 | Temp 97.1°F | Ht 69.0 in | Wt 222.5 lb

## 2020-01-26 DIAGNOSIS — R202 Paresthesia of skin: Secondary | ICD-10-CM

## 2020-01-26 LAB — CBC WITH DIFFERENTIAL/PLATELET
Basophils Absolute: 0 10*3/uL (ref 0.0–0.1)
Basophils Relative: 0.8 % (ref 0.0–3.0)
Eosinophils Absolute: 0.2 10*3/uL (ref 0.0–0.7)
Eosinophils Relative: 4.4 % (ref 0.0–5.0)
HCT: 49.7 % (ref 39.0–52.0)
Hemoglobin: 16.5 g/dL (ref 13.0–17.0)
Lymphocytes Relative: 36.7 % (ref 12.0–46.0)
Lymphs Abs: 1.5 10*3/uL (ref 0.7–4.0)
MCHC: 33.3 g/dL (ref 30.0–36.0)
MCV: 91.1 fl (ref 78.0–100.0)
Monocytes Absolute: 0.4 10*3/uL (ref 0.1–1.0)
Monocytes Relative: 9.6 % (ref 3.0–12.0)
Neutro Abs: 2 10*3/uL (ref 1.4–7.7)
Neutrophils Relative %: 48.5 % (ref 43.0–77.0)
Platelets: 127 10*3/uL — ABNORMAL LOW (ref 150.0–400.0)
RBC: 5.45 Mil/uL (ref 4.22–5.81)
RDW: 13.1 % (ref 11.5–15.5)
WBC: 4.1 10*3/uL (ref 4.0–10.5)

## 2020-01-26 LAB — VITAMIN B12: Vitamin B-12: 696 pg/mL (ref 211–911)

## 2020-01-26 LAB — COMPREHENSIVE METABOLIC PANEL
ALT: 21 U/L (ref 0–53)
AST: 17 U/L (ref 0–37)
Albumin: 4.2 g/dL (ref 3.5–5.2)
Alkaline Phosphatase: 61 U/L (ref 39–117)
BUN: 18 mg/dL (ref 6–23)
CO2: 29 mEq/L (ref 19–32)
Calcium: 9.3 mg/dL (ref 8.4–10.5)
Chloride: 107 mEq/L (ref 96–112)
Creatinine, Ser: 1.47 mg/dL (ref 0.40–1.50)
GFR: 48.17 mL/min — ABNORMAL LOW (ref >60.00)
Glucose, Bld: 97 mg/dL (ref 70–99)
Potassium: 4.1 mEq/L (ref 3.5–5.1)
Sodium: 142 mEq/L (ref 135–145)
Total Bilirubin: 0.4 mg/dL (ref 0.2–1.2)
Total Protein: 6.6 g/dL (ref 6.0–8.3)

## 2020-01-26 LAB — TSH: TSH: 1.24 u[IU]/mL (ref 0.35–4.50)

## 2020-01-26 NOTE — Progress Notes (Signed)
This visit occurred during the SARS-CoV-2 public health emergency.  Safety protocols were in place, including screening questions prior to the visit, additional usage of staff PPE, and extensive cleaning of exam room while observing appropriate contact time as indicated for disinfecting solutions.  "Dizzy spells."  No presyncope.  He doesn't feel off balance. No room spinning.  Checked sugar at the time, 106.  Can last about 30 seconds.  Going on for about 1 month, episodically.  May happen a twice a day, but not every day.  Random onset.    He has some episodic arm tingling, R arm.  occ with R leg tingling.  They may not happen together.    He can have dizzy sensation with or without tingling.  If he has the tingling sx then he'll be dizzy.  No L sided sx.    No sx with rolling over in the bed.  No CP, SOB, BLE edema.  He recently started back at the gym, exercising for up to an hour with cycling and doing well with that. No exertional sx.    Meds, vitals, and allergies reviewed.   ROS: Per HPI unless specifically indicated in ROS section   GEN: nad, alert and oriented HEENT: TM wnl NECK: supple w/o LA CV: rrr.  PULM: ctab, no inc wob ABD: soft, +bs EXT: no edema SKIN: no acute rash CN 2-12 wnl B, S/S/DTR wnl x4

## 2020-01-26 NOTE — Patient Instructions (Signed)
Go to the lab on the way out.   If you have mychart we'll likely use that to update you.    Take care.  Glad to see you. 

## 2020-01-27 DIAGNOSIS — R202 Paresthesia of skin: Secondary | ICD-10-CM | POA: Insufficient documentation

## 2020-01-27 NOTE — Assessment & Plan Note (Signed)
Episodic dizziness but no room spinning and no clear trigger otherwise.  He can occasionally have paresthesias associated with the dizzy sensation but not always.  Unclear source.  Discussed differential diagnosis with patient and reasonable to start with routine labs since he has no symptoms now and an unremarkable exam.  He agrees.  See notes on labs.

## 2020-01-29 ENCOUNTER — Other Ambulatory Visit: Payer: Self-pay | Admitting: Family Medicine

## 2020-01-29 DIAGNOSIS — R202 Paresthesia of skin: Secondary | ICD-10-CM

## 2020-03-01 ENCOUNTER — Encounter: Payer: Self-pay | Admitting: Neurology

## 2020-03-01 ENCOUNTER — Ambulatory Visit: Payer: BC Managed Care – PPO | Admitting: Neurology

## 2020-03-01 ENCOUNTER — Other Ambulatory Visit: Payer: Self-pay

## 2020-03-01 VITALS — BP 149/81 | HR 81 | Temp 97.2°F | Ht 69.0 in | Wt 220.5 lb

## 2020-03-01 DIAGNOSIS — R202 Paresthesia of skin: Secondary | ICD-10-CM

## 2020-03-01 DIAGNOSIS — R42 Dizziness and giddiness: Secondary | ICD-10-CM

## 2020-03-01 NOTE — Progress Notes (Signed)
[facility]  PATIENT: Edward [name] DOB: 1956/09/03  REFERRING DOCTOR OR PCP: Crawford Givens, MD SOURCE: Patient, notes from primary care, laboratory reports  _________________________________   HISTORICAL  CHIEF COMPLAINT:  Chief Complaint  Patient presents with  . New Patient (Initial Visit)    RM 12, alone. Internal referral from Crawford Givens, MD (PCP) for paresthesia. Having dizziness, numbness in right side intermittently. Last episode last Wed.     HISTORY OF PRESENT ILLNESS:  I had the pleasure of seeing your patient, Edward [name], at [facility] for neurologic consultation regarding his paresthesias.  He is a 64 year old man with 30 second episodes of lightheaded dizziness associated with right arm and leg numbness.    He has had about 20 episodes wince the first one about one month or two.   The first episode occurred after standiing up from the floor doing a puzzle.  All episodes have occurred while standing or walking, almost always after getting up form a seated position.     He notes lightheadedness but does not feel he will pass out and never had LOC.   While at rest (sitting or laying) he never has right sided numbness.  He has mildly elevated BP on some measurements the past and he notes gaining 20-25 pounds over the past year as he is less active.      He notes mild restless leg syndrome.   About half the nights he feels dysesthesias around his ankles relieved by movements.   This only occurs while laying down.    I reviewed lab work from 01/26/2020.  He has stable mildly reduced GFR and platelet count.  Other labs including B12 and TSH were essentially normal.   He is not on any medications.      REVIEW OF SYSTEMS: Constitutional: No fevers, chills, sweats, or change in appetite Eyes: No visual changes, double vision, eye pain Ear, nose and throat: No hearing loss, ear pain, nasal congestion, sore  throat Cardiovascular: No chest pain, palpitations Respiratory: No shortness of breath at rest or with exertion.   No wheezes GastrointestinaI: No nausea, vomiting, diarrhea, abdominal pain, fecal incontinence Genitourinary: No dysuria, urinary retention or frequency.  No nocturia. Musculoskeletal: No neck pain, back pain Integumentary: No rash, pruritus, skin lesions Neurological: as above Psychiatric: No depression at this time.  No anxiety Endocrine: No palpitations, diaphoresis, change in appetite, change in weigh or increased thirst Hematologic/Lymphatic: No anemia, purpura, petechiae. Allergic/Immunologic: No itchy/runny eyes, nasal congestion, recent allergic reactions, rashes  ALLERGIES: No Known Allergies  HOME MEDICATIONS:  Current Outpatient Medications:  .  aspirin 81 MG tablet, Take 1 tablet (81 mg total) by mouth daily., Disp: 30 tablet, Rfl:  .  Omega-3 Fatty Acids (FISH OIL PO), Take 1 Dose by mouth daily., Disp: , Rfl:   PAST MEDICAL HISTORY: Past Medical History:  Diagnosis Date  . CAD (coronary artery disease)   . HCV antibody positive    previous eval at [facility] hepatology clinic  . STEMI (ST elevation myocardial infarction) ([facility]) 2004   with totally occluded CFX (infarct related artery) and 80% mRCA stenosis. Taxus DES to both vessles. EF was 50-55% by LV-gram    PAST SURGICAL HISTORY: Past Surgical History:  Procedure Laterality Date  . CORONARY ANGIOPLASTY WITH STENT PLACEMENT  08/22/03    FAMILY HISTORY: Family History  Problem Relation Age of Onset  . Aneurysm Father        brain  . Stroke Father   .  Other Mother        ASCVD  . Hypertension Mother   . Heart attack Other        uncle  . Diabetes Child   . Diabetes Child   . Colon cancer Neg Hx   . Prostate cancer Neg Hx     SOCIAL HISTORY:  Social History   Socioeconomic History  . Marital status: Married    Spouse name: Cadell Gabrielson  . Number of children: 3  . Years of education:  30  . Highest education level: Not on file  Occupational History  . Occupation: Retired  Tobacco Use  . Smoking status: Former Smoker    Quit date: 12/11/2003    Years since quitting: 16.2  . Smokeless tobacco: Never Used  Substance and Sexual Activity  . Alcohol use: No  . Drug use: No  . Sexual activity: Not on file  Other Topics Concern  . 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 Difficulty of Paying Living Expenses:   Food Insecurity:   . Worried About Programme researcher, broadcasting/film/video in the Last Year:   . Barista in the Last Year:   Transportation Needs:   . Freight forwarder (Medical):   Marland Kitchen Lack of Transportation (Non-Medical):   Physical Activity:   . Days of Exercise per Week:   . Minutes of Exercise per Session:   Stress:   . Feeling of Stress :   Social Connections:   . Frequency of Communication with Friends and Family:   . Frequency of Social Gatherings with Friends and Family:   . Attends Religious Services:   . Active Member of Clubs or Organizations:   . Attends Banker Meetings:   Marland Kitchen Marital Status:   Intimate Partner Violence:   . Fear of Current or Ex-Partner:   . Emotionally Abused:   Marland Kitchen Physically Abused:   . Sexually Abused:      PHYSICAL EXAM  Vitals:   03/01/20 1052  BP: (!) 149/81  Pulse: 81  Temp: (!) 97.2 F (36.2 C)  Weight: 220 lb 8 oz (100 kg)  Height: 5\' 9"  (1.753 m)    Body mass index is 32.56 kg/m.  ORTHOSTATIC VS Supine 135/78 76 Sitting  135/80  80 Standing  135/80 80  General: The patient is well-developed and well-nourished and in no acute distress  HEENT:  Head is [place]/AT.  Sclera are anicteric.     Neck: No carotid bruits are noted.     Cardiovascular: The heart has a regular rate and rhythm with a normal S1 and S2. There were no murmurs, gallops or rubs.    Skin: Extremities are without rash or  edema.  Musculoskeletal:  Back is nontender  Neurologic Exam  Mental status: The patient is alert and oriented x 3 at the time of the examination. The patient has apparent normal recent and remote memory, with an apparently normal attention span and concentration ability.   Speech is normal.  Cranial nerves: Extraocular movements are full.    Facial symmetry is present. There is good facial sensation to soft touch bilaterally.Facial strength is normal.  Trapezius and  sternocleidomastoid strength is normal. No dysarthria is noted.  The tongue is midline, and the patient has symmetric elevation of the soft palate. No obvious hearing deficits are noted.  Motor:  Muscle bulk is normal.   Tone is normal. Strength is  5 / 5 in all 4 extremities.   Sensory: Sensory testing is intact to pinprick, soft touch and vibration sensation in all 4 extremities.  Coordination: Cerebellar testing reveals good finger-nose-finger and heel-to-shin bilaterally.  Gait and station: Station is normal.   Gait is normal. Tandem gait is normal. Romberg is negative.   Reflexes: Deep tendon reflexes are symmetric and normal bilaterally.   Plantar responses are flexor.    DIAGNOSTIC DATA (LABS, IMAGING, TESTING) - I reviewed patient records, labs, notes, testing and imaging myself where available.  Lab Results  Component Value Date   WBC 4.1 01/26/2020   HGB 16.5 01/26/2020   HCT 49.7 01/26/2020   MCV 91.1 01/26/2020   PLT 127.0 (L) 01/26/2020      Component Value Date/Time   NA 142 01/26/2020 1109   K 4.1 01/26/2020 1109   CL 107 01/26/2020 1109   CO2 29 01/26/2020 1109   GLUCOSE 97 01/26/2020 1109   BUN 18 01/26/2020 1109   CREATININE 1.47 01/26/2020 1109   CALCIUM 9.3 01/26/2020 1109   PROT 6.6 01/26/2020 1109   ALBUMIN 4.2 01/26/2020  1109   AST 17 01/26/2020 1109   ALT 21 01/26/2020 1109   ALKPHOS 61 01/26/2020 1109   BILITOT 0.4 01/26/2020 1109   GFRNONAA 69.41 07/31/2010 0859   GFRAA 68 09/28/2008 1012   Lab Results  Component Value Date   CHOL 155 01/23/2019   HDL 38.10 (L) 01/23/2019   LDLCALC 105 (H) 01/23/2019   TRIG 62.0 01/23/2019   CHOLHDL 4 01/23/2019   Lab Results  Component Value Date   HGBA1C 5.9 11/15/2017   Lab Results  Component Value Date   GGYIRSWN46 270 01/26/2020   Lab Results  Component Value Date   TSH 1.24 01/26/2020       ASSESSMENT AND PLAN  Paresthesia  Episodic lightheadedness  In summary, Mr. Maker is a 64 year old man who had multiple episodes of lightheadedness over the last month but none in the last week.  These episodes generally occurred while he change position from sitting to standing.  Although there is likely an orthostatic component, orthostatic vital signs today were stable.  I see has not had any episodes for the last week hopefully he will continue to do well.  I discussed with him that if spells increase again we may wish to check additional studies (such as MR angiogram and Holter monitor).  Additionally, if episodes worsen again we could consider fludrocortisone or pyridostigmine for suspected orthostatic hypotension.  The mild numbness he experienced on the left side only occurred when he also had lightheadedness.  The etiology is unclear.  He will return to see me as needed.  Thank you for asking me to see Mr. Spanos.  Please let me know if I need further assistance with him or other patients in the future.     Terica Yogi A. Felecia Shelling, MD, [facility] 3/50/0938, 1:82 PM Certified in Neurology, Clinical Neurophysiology, Sleep Medicine and Neuroimaging  [facility] [address] [place], [place] [address] [phone]

## 2020-09-26 ENCOUNTER — Other Ambulatory Visit (INDEPENDENT_AMBULATORY_CARE_PROVIDER_SITE_OTHER): Payer: Medicare Other

## 2020-09-26 ENCOUNTER — Other Ambulatory Visit: Payer: Self-pay

## 2020-09-26 ENCOUNTER — Other Ambulatory Visit: Payer: Self-pay | Admitting: Family Medicine

## 2020-09-26 DIAGNOSIS — E785 Hyperlipidemia, unspecified: Secondary | ICD-10-CM

## 2020-09-26 DIAGNOSIS — R739 Hyperglycemia, unspecified: Secondary | ICD-10-CM

## 2020-09-26 DIAGNOSIS — D696 Thrombocytopenia, unspecified: Secondary | ICD-10-CM

## 2020-09-26 DIAGNOSIS — R972 Elevated prostate specific antigen [PSA]: Secondary | ICD-10-CM | POA: Diagnosis not present

## 2020-09-26 LAB — COMPREHENSIVE METABOLIC PANEL
ALT: 18 U/L (ref 0–53)
AST: 17 U/L (ref 0–37)
Albumin: 4 g/dL (ref 3.5–5.2)
Alkaline Phosphatase: 58 U/L (ref 39–117)
BUN: 18 mg/dL (ref 6–23)
CO2: 27 mEq/L (ref 19–32)
Calcium: 9.1 mg/dL (ref 8.4–10.5)
Chloride: 107 mEq/L (ref 96–112)
Creatinine, Ser: 1.59 mg/dL — ABNORMAL HIGH (ref 0.40–1.50)
GFR: 44.88 mL/min — ABNORMAL LOW (ref >60.00)
Glucose, Bld: 107 mg/dL — ABNORMAL HIGH (ref 70–99)
Potassium: 4.3 mEq/L (ref 3.5–5.1)
Sodium: 140 mEq/L (ref 135–145)
Total Bilirubin: 0.5 mg/dL (ref 0.2–1.2)
Total Protein: 6.3 g/dL (ref 6.0–8.3)

## 2020-09-26 LAB — CBC WITH DIFFERENTIAL/PLATELET
Basophils Absolute: 0 10*3/uL (ref 0.0–0.1)
Basophils Relative: 1.2 % (ref 0.0–3.0)
Eosinophils Absolute: 0.2 10*3/uL (ref 0.0–0.7)
Eosinophils Relative: 5.3 % — ABNORMAL HIGH (ref 0.0–5.0)
HCT: 47.8 % (ref 39.0–52.0)
Hemoglobin: 16 g/dL (ref 13.0–17.0)
Lymphocytes Relative: 37.4 % (ref 12.0–46.0)
Lymphs Abs: 1.4 10*3/uL (ref 0.7–4.0)
MCHC: 33.5 g/dL (ref 30.0–36.0)
MCV: 90.1 fl (ref 78.0–100.0)
Monocytes Absolute: 0.3 10*3/uL (ref 0.1–1.0)
Monocytes Relative: 9 % (ref 3.0–12.0)
Neutro Abs: 1.8 10*3/uL (ref 1.4–7.7)
Neutrophils Relative %: 47.1 % (ref 43.0–77.0)
Platelets: 121 10*3/uL — ABNORMAL LOW (ref 150.0–400.0)
RBC: 5.31 Mil/uL (ref 4.22–5.81)
RDW: 13.5 % (ref 11.5–15.5)
WBC: 3.9 10*3/uL — ABNORMAL LOW (ref 4.0–10.5)

## 2020-09-26 LAB — LIPID PANEL
Cholesterol: 163 mg/dL (ref 0–200)
HDL: 33.6 mg/dL — ABNORMAL LOW (ref >39.00)
LDL Cholesterol: 116 mg/dL — ABNORMAL HIGH (ref 0–99)
NonHDL: 129.68
Total CHOL/HDL Ratio: 5
Triglycerides: 70 mg/dL (ref 0.0–149.0)
VLDL: 14 mg/dL (ref 0.0–40.0)

## 2020-09-26 LAB — HEMOGLOBIN A1C: Hgb A1c MFr Bld: 6.2 % (ref 4.6–6.5)

## 2020-09-26 LAB — PSA: PSA: 3.58 ng/mL (ref 0.10–4.00)

## 2020-10-03 ENCOUNTER — Telehealth: Payer: Medicare Other | Admitting: Family Medicine

## 2020-10-03 ENCOUNTER — Encounter: Payer: Self-pay | Admitting: Family Medicine

## 2020-10-03 ENCOUNTER — Ambulatory Visit (INDEPENDENT_AMBULATORY_CARE_PROVIDER_SITE_OTHER): Payer: Medicare Other | Admitting: Family Medicine

## 2020-10-03 ENCOUNTER — Other Ambulatory Visit: Payer: Self-pay

## 2020-10-03 VITALS — BP 122/70 | HR 85 | Temp 96.0°F | Ht 69.0 in | Wt 216.5 lb

## 2020-10-03 DIAGNOSIS — Z1211 Encounter for screening for malignant neoplasm of colon: Secondary | ICD-10-CM

## 2020-10-03 DIAGNOSIS — D696 Thrombocytopenia, unspecified: Secondary | ICD-10-CM

## 2020-10-03 DIAGNOSIS — Z0000 Encounter for general adult medical examination without abnormal findings: Secondary | ICD-10-CM | POA: Diagnosis not present

## 2020-10-03 DIAGNOSIS — Z7189 Other specified counseling: Secondary | ICD-10-CM

## 2020-10-03 DIAGNOSIS — I2510 Atherosclerotic heart disease of native coronary artery without angina pectoris: Secondary | ICD-10-CM

## 2020-10-03 DIAGNOSIS — R972 Elevated prostate specific antigen [PSA]: Secondary | ICD-10-CM

## 2020-10-03 NOTE — Patient Instructions (Addendum)
Get a flu shot at the pharmacy.  We can do the pneumonia shot later on.  Check with your insurance to see if they will cover the shingles and tetanus shots.  Let me know if you want to do the aneurysm screening.  Consider talking with cardiology about cholesterol medicine.  Take care.  Glad to see you. Get propped up at night when readings.  Update me as needed.

## 2020-10-03 NOTE — Progress Notes (Signed)
This visit occurred during the SARS-CoV-2 public health emergency.  Safety protocols were in place, including screening questions prior to the visit, additional usage of staff PPE, and extensive cleaning of exam room while observing appropriate contact time as indicated for disinfecting solutions.  I have personally reviewed the Medicare Annual Wellness questionnaire and have noted 1. The patient's medical and social history 2. Their use of alcohol, tobacco or illicit drugs 3. Their current medications and supplements 4. The patient's functional ability including ADL's, fall risks, home safety risks and hearing or visual             impairment. 5. Diet and physical activities 6. Evidence for depression or mood disorders  The patients weight, height, BMI have been recorded in the chart and visual acuity is per eye clinic.  I have made referrals, counseling and provided education to the patient based review of the above and I have provided the pt with a written personalized care plan for preventive services.  Provider list updated- see scanned forms.  Routine anticipatory guidance given to patient.  See health maintenance. The possibility exists that previously documented standard health maintenance information may have been brought forward from a previous encounter into this note.  If needed, that same information has been updated to reflect the current situation based on today's encounter.    Flu to be done at pharmacy Shingles discussed with patient PNA discussed with patient Tetanus 2011.  Discussed with patient. See EMR regarding Covid vaccine. D/w patient VH:QIONGEX for colon cancer screening, including IFOB vs. colonoscopy.  Risks and benefits of both were discussed and patient voiced understanding.  Pt elects for: Cologuard. Prostate cancer screening 2021.  Discussed with patient. Advance directive-wife designated if patient were incapacitated. Cognitive function addressed- see scanned  forms- and if abnormal then additional documentation follows.  AAA screening d/w pt.  He'll consider.  He doesn't have enough tobacco history to qualify for lung cancer screening.  Discussed.  PSA lower than previous.  Discussed. Mild thrombocytopenia stable, no bleeding.  Discussed with patient. Creatinine similar to previous with baseline 1.4-1.6.  Discussed.  Avoid NSAIDs.  Passed hearing screen by whisper test B.  EKG in chart.  Vision screen done today.  See screening report.  CAD.  No CP, SOB, BLE edema.  Encouraged him to consider statin or alternative lipid-lowering agent.  Rationale discussed with patient.  He has mechanical sx with gurgling in the neck, only when laying down at night and neck flexed watching TV or reading.  No sx laying flat.  He isn't SOB o/w.  He can workout hard at the gym w/o troubles.  No vomiting, no blood in stool.  No heartburn.  D/w pt about positional changes as this could be positionally exacerbated GERD.    No sx or orthostasis or paresthesia.  Those symptoms resolved.  PMH and SH reviewed  Meds, vitals, and allergies reviewed.   ROS: Per HPI.  Unless specifically indicated otherwise in HPI, the patient denies:  General: fever. Eyes: acute vision changes ENT: sore throat Cardiovascular: chest pain Respiratory: SOB GI: vomiting GU: dysuria Musculoskeletal: acute back pain Derm: acute rash Neuro: acute motor dysfunction Psych: worsening mood Endocrine: polydipsia Heme: bleeding Allergy: hayfever  GEN: nad, alert and oriented HEENT: ncat NECK: supple w/o LA, no stridor. CV: rrr. PULM: ctab, no inc wob ABD: soft, +bs EXT: no edema SKIN: no acute rash

## 2020-10-05 DIAGNOSIS — D696 Thrombocytopenia, unspecified: Secondary | ICD-10-CM | POA: Insufficient documentation

## 2020-10-05 NOTE — Assessment & Plan Note (Signed)
History of, improved on check most recently.

## 2020-10-05 NOTE — Assessment & Plan Note (Signed)
No CP, SOB, BLE edema.  Encouraged him to consider statin or alternative lipid-lowering agent.  Rationale discussed with patient.

## 2020-10-05 NOTE — Assessment & Plan Note (Addendum)
Mild thrombocytopenia stable, no bleeding.  Discussed with patient.  We can recheck periodically.  Observe for now.

## 2020-10-05 NOTE — Assessment & Plan Note (Signed)
Flu to be done at pharmacy Shingles discussed with patient PNA discussed with patient Tetanus 2011.  Discussed with patient. See EMR regarding Covid vaccine. D/w patient [id number] for colon cancer screening, including IFOB vs. colonoscopy.  Risks and benefits of both were discussed and patient voiced understanding.  Pt elects for: Cologuard. Prostate cancer screening 2021.  Discussed with patient. Advance directive-wife designated if patient were incapacitated. Cognitive function addressed- see scanned forms- and if abnormal then additional documentation follows.  AAA screening d/w pt.  He'll consider.  He doesn't have enough tobacco history to qualify for lung cancer screening.  Discussed.  Passed hearing screen by whisper test B.  EKG in chart.  Vision screen done today.  See screening report.

## 2020-10-05 NOTE — Assessment & Plan Note (Signed)
Advance directive- wife designated if patient were incapacitated.  

## 2020-10-06 NOTE — Addendum Note (Signed)
Addended by: Annamarie Major on: 10/06/2020 10:14 AM   Modules accepted: Orders

## 2020-11-14 ENCOUNTER — Telehealth: Payer: Self-pay | Admitting: Family Medicine

## 2020-11-14 DIAGNOSIS — R6889 Other general symptoms and signs: Secondary | ICD-10-CM

## 2020-11-14 NOTE — Telephone Encounter (Signed)
Patient called.  Patient said he spoke to Dr.Duncan at his last visit about his throat. Patient gets a gurgling noise when he lays on his back and trouble breathing. Dr.Duncan suggested patient try Prilosec for acid reflux. Dr.Duncan told him if that didn't help, he would refer patient to someone for his throat. Patient said it's not better and he wants to be referred to someone. Patient prefers [facility] and he can go anytime.

## 2020-11-16 NOTE — Telephone Encounter (Signed)
Please let him know that I put in the referral to ENT.  The other option would be seeing the GI clinic, but since the symptoms are in his throat I thought it made sense to see the ENT clinic first.  Thanks.

## 2020-11-17 NOTE — Telephone Encounter (Signed)
LVM for pt regarding his referral to ENT per Dr. Para March

## 2020-11-18 NOTE — Telephone Encounter (Signed)
I also left a message for the patient to call back. I also need to know what throat symptoms he is having for the referral when I call. Thank you

## 2020-11-22 NOTE — Telephone Encounter (Signed)
Patient advised of everything. Working on referral for gurgling in his throat per patient. See referral order for more information.

## 2020-11-24 DIAGNOSIS — J3489 Other specified disorders of nose and nasal sinuses: Secondary | ICD-10-CM | POA: Diagnosis not present

## 2020-11-24 DIAGNOSIS — K219 Gastro-esophageal reflux disease without esophagitis: Secondary | ICD-10-CM | POA: Diagnosis not present

## 2020-12-12 ENCOUNTER — Telehealth: Payer: Self-pay

## 2020-12-12 NOTE — Telephone Encounter (Signed)
Called patient to see if he had sent in his cologuard sample. Patient stated that he never received the kit. Called [facility] to see if they could resend it. Representative stated that they needed the patient to call to make sure they have the right address. Called patient back and gave him the number to call. Patient stated that he would call to follow-up.

## 2020-12-13 NOTE — Telephone Encounter (Signed)
Noted. Thanks.

## 2020-12-16 DIAGNOSIS — Z1211 Encounter for screening for malignant neoplasm of colon: Secondary | ICD-10-CM | POA: Diagnosis not present

## 2020-12-27 LAB — EXTERNAL GENERIC LAB PROCEDURE: COLOGUARD: NEGATIVE

## 2020-12-27 LAB — COLOGUARD
COLOGUARD: NEGATIVE
Cologuard: NEGATIVE

## 2021-02-08 ENCOUNTER — Telehealth: Payer: Self-pay | Admitting: Family Medicine

## 2021-02-08 NOTE — Telephone Encounter (Signed)
Spoke with patient and advised patient that he is already established with Dr. Anne Fu and can call and schedule an appt with them. Advised if he does need a referral for insurance purposes or can not be seen soon to call back and let us know so he can be seen here. Patient agrees to do so.

## 2021-02-08 NOTE — Telephone Encounter (Signed)
Pt called in wanted to know about getting a referral for a cardiologist due to shortness of breathe , I did inform him that he would need to make an appointment but he stated the last time he wrote one he didn't see him

## 2021-02-13 ENCOUNTER — Ambulatory Visit (HOSPITAL_COMMUNITY)
Admission: EM | Admit: 2021-02-13 | Discharge: 2021-02-13 | Disposition: A | Discharge status: Home / Self Care | Payer: Medicare Other | Attending: Emergency Medicine | Admitting: Emergency Medicine

## 2021-02-13 ENCOUNTER — Other Ambulatory Visit: Payer: Self-pay

## 2021-02-13 ENCOUNTER — Ambulatory Visit (INDEPENDENT_AMBULATORY_CARE_PROVIDER_SITE_OTHER): Payer: Medicare Other

## 2021-02-13 ENCOUNTER — Telehealth: Payer: Self-pay | Admitting: Cardiology

## 2021-02-13 DIAGNOSIS — R9389 Abnormal findings on diagnostic imaging of other specified body structures: Secondary | ICD-10-CM

## 2021-02-13 DIAGNOSIS — R062 Wheezing: Secondary | ICD-10-CM | POA: Diagnosis not present

## 2021-02-13 DIAGNOSIS — J9811 Atelectasis: Secondary | ICD-10-CM | POA: Diagnosis not present

## 2021-02-13 DIAGNOSIS — J90 Pleural effusion, not elsewhere classified: Secondary | ICD-10-CM | POA: Diagnosis not present

## 2021-02-13 DIAGNOSIS — R0602 Shortness of breath: Secondary | ICD-10-CM | POA: Diagnosis not present

## 2021-02-13 DIAGNOSIS — I10 Essential (primary) hypertension: Secondary | ICD-10-CM | POA: Diagnosis not present

## 2021-02-13 DIAGNOSIS — I517 Cardiomegaly: Secondary | ICD-10-CM | POA: Diagnosis not present

## 2021-02-13 LAB — COMPREHENSIVE METABOLIC PANEL
ALT: 58 U/L — ABNORMAL HIGH (ref 0–44)
AST: 27 U/L (ref 15–41)
Albumin: 4 g/dL (ref 3.5–5.0)
Alkaline Phosphatase: 60 U/L (ref 38–126)
Anion gap: 9 (ref 5–15)
BUN: 16 mg/dL (ref 8–23)
CO2: 24 mmol/L (ref 22–32)
Calcium: 9.4 mg/dL (ref 8.9–10.3)
Chloride: 109 mmol/L (ref 98–111)
Creatinine, Ser: 1.69 mg/dL — ABNORMAL HIGH (ref 0.61–1.24)
GFR, Estimated: 44 mL/min — ABNORMAL LOW (ref >60)
Glucose, Bld: 81 mg/dL (ref 70–99)
Potassium: 4.5 mmol/L (ref 3.5–5.1)
Sodium: 142 mmol/L (ref 135–145)
Total Bilirubin: 0.9 mg/dL (ref 0.3–1.2)
Total Protein: 6.7 g/dL (ref 6.5–8.1)

## 2021-02-13 LAB — CBC WITH DIFFERENTIAL/PLATELET
Abs Immature Granulocytes: 0.01 10*3/uL (ref 0.00–0.07)
Basophils Absolute: 0 10*3/uL (ref 0.0–0.1)
Basophils Relative: 1 %
Eosinophils Absolute: 0.1 10*3/uL (ref 0.0–0.5)
Eosinophils Relative: 3 %
HCT: 50.5 % (ref 39.0–52.0)
Hemoglobin: 17.2 g/dL — ABNORMAL HIGH (ref 13.0–17.0)
Immature Granulocytes: 0 %
Lymphocytes Relative: 25 %
Lymphs Abs: 1.3 10*3/uL (ref 0.7–4.0)
MCH: 30.3 pg (ref 26.0–34.0)
MCHC: 34.1 g/dL (ref 30.0–36.0)
MCV: 89.1 fL (ref 80.0–100.0)
Monocytes Absolute: 0.5 10*3/uL (ref 0.1–1.0)
Monocytes Relative: 9 %
Neutro Abs: 3.3 10*3/uL (ref 1.7–7.7)
Neutrophils Relative %: 62 %
Platelets: 140 10*3/uL — ABNORMAL LOW (ref 150–400)
RBC: 5.67 MIL/uL (ref 4.22–5.81)
RDW: 13.2 % (ref 11.5–15.5)
WBC: 5.3 10*3/uL (ref 4.0–10.5)
nRBC: 0 % (ref 0.0–0.2)

## 2021-02-13 LAB — BRAIN NATRIURETIC PEPTIDE: B Natriuretic Peptide: 998 pg/mL — ABNORMAL HIGH (ref 0.0–100.0)

## 2021-02-13 LAB — TSH: TSH: 2.133 u[IU]/mL (ref 0.350–4.500)

## 2021-02-13 MED ORDER — AMOXICILLIN-POT CLAVULANATE 875-125 MG PO TABS: 1.0000 | ORAL_TABLET | Freq: Two times a day (BID) | ORAL | 0 refills | Status: DC

## 2021-02-13 MED ORDER — AZITHROMYCIN 250 MG PO TABS: ORAL_TABLET | ORAL | 0 refills | Status: AC

## 2021-02-13 NOTE — Telephone Encounter (Signed)
Left message for patient to call back  

## 2021-02-13 NOTE — Discharge Instructions (Signed)
Your chest xray is abnormal today, there is concern for infection vs fluid buildup which can be related to your heart.  Be mindful of your fluid intake over the next few days and monitor your weight daily.  I will call you if your labs have any abnormal findings.  We will start antibiotics to treat any infection.  Please follow up with your cardiologist in the next 1-2 weeks for recheck as you likely will need further evaluation.  Go to the ER for any worsening of symptoms.

## 2021-02-13 NOTE — ED Triage Notes (Signed)
Pt c/o SOB for approx 2 weeks, states worse at night while lying down. States hasn't slept well for several weeks 2/2 Sob awakening him.  Pt states his breathing changes in rhythm, that he feels like he has to take several quick breaths to feel like he "is getting good enough breath".  Denies CP, back/arm/neck pain, n/v diaphoresis, dizziness, fever, cough, sore throat, congestion.   Trace edema to LLE; faint coarse sounds at bases otherwise CTA.   Pt states he usually performs physical exercise three times/week, hasn't been able to do so 2/2 SOB.  Pt resp pattern irregular: slow breaths for approx 10 seconds, then quick breaths for approx 15 seconds.  EKG performed and given to Marilynn Rail, NP

## 2021-02-13 NOTE — Telephone Encounter (Signed)
Pt c/o Shortness Of Breath: STAT if SOB developed within the last 24 hours or pt is noticeably SOB on the phone  1. Are you currently SOB (can you hear that pt is SOB on the phone)? Yes , if he walk at all he is out of Breath  2. How long have you been experiencing SOB? Since last Thursday  But gotten progressively worse   3. Are you SOB when sitting or when up moving around? Up and moving and at night when he lays down he cant catch his breath   4. Are you currently experiencing any other symptoms? Pt denies any other symptoms   Pt stated he has not has a good night sleep sine thurs  Best number - [phone]

## 2021-02-13 NOTE — ED Provider Notes (Signed)
[facility]    CSN: [id number] Arrival date & time: 02/13/21  1322      History   Chief Complaint Chief Complaint  Patient presents with  . Shortness of Breath    HPI Edward [name] is a 65 y.o. male.   Edward [name] presents with complaints of shortness of breath . Started around 2 weeks ago and has been worsening. Difficulty sleeping due to shortness of breath. Worse when he lays flat. No peripheral edema or obvious weight gain. No chest pain , no cough, no abdominal pain, no back pain. He feels that activity causes increased shortness of breath , he typically can work out at [employer] riding a bike or lifting weights without difficulty. Now he feels too short of breath with these activities. States at night when he lays flat he sometimes hears "gargling" to his lungs.  No fevers, no congestion, no cough. History of stemi and cad, has followed with cardiology. Has appointment on 3/15. No asthma or copd history, quit smoking around 15 years ago.    ROS per HPI, negative if not otherwise mentioned.      Past Medical History:  Diagnosis Date  . CAD (coronary artery disease)   . HCV antibody positive    previous eval at [facility] hepatology clinic  . STEMI (ST elevation myocardial infarction) (HCC) 2004   with totally occluded CFX (infarct related artery) and 80% mRCA stenosis. Taxus DES to both vessles. EF was 50-55% by LV-gram    Patient Active Problem List   Diagnosis Date Noted  . Thrombocytopenia (HCC) 10/05/2020  . Episodic lightheadedness 03/01/2020  . Paresthesia 01/27/2020  . Left knee pain 02/05/2019  . Elevated PSA 09/22/2017  . Hyperglycemia 10/13/2014  . Hyperlipidemia 10/13/2014  . Coronary atherosclerosis of native coronary artery 10/10/2014  . Welcome to Medicare preventive visit 10/10/2014  . Advance care planning 10/10/2014  . HCV antibody positive 04/30/2008    Past Surgical History:  Procedure Laterality Date  . CORONARY ANGIOPLASTY  WITH STENT PLACEMENT  08/22/03       Home Medications    Prior to Admission medications   Medication Sig Start Date End Date Taking? Authorizing Provider  amoxicillin-clavulanate (AUGMENTIN) 875-125 MG tablet Take 1 tablet by mouth every 12 (twelve) hours. 02/13/21  Yes Linus Mako B, NP  aspirin 325 MG tablet Take by mouth.   Yes [provider]  azithromycin (ZITHROMAX) 250 MG tablet Take 2 tablets (500 mg total) by mouth daily for 1 day, THEN 1 tablet (250 mg total) daily for 4 days. 02/13/21 02/18/21 Yes Linus Mako B, NP  omeprazole (PRILOSEC) 20 MG capsule Take by mouth. 11/24/20 03/24/21 Yes [provider]  aspirin 81 MG tablet Take 1 tablet (81 mg total) by mouth daily. 02/03/19   Joaquim Nam, MD  Omega-3 Fatty Acids (FISH OIL PO) Take 1 Dose by mouth daily.    [provider]    Family History Family History  Problem Relation Age of Onset  . Aneurysm Father        brain  . Stroke Father   . Other Mother        ASCVD  . Hypertension Mother   . Heart attack Other        uncle  . Diabetes Child   . Diabetes Child   . Colon cancer Neg Hx   . Prostate cancer Neg Hx     Social History Social History   Tobacco Use  .  Smoking status: Former Smoker    Quit date: 12/11/2003    Years since quitting: 17.1  . Smokeless tobacco: Never Used  Vaping Use  . Vaping Use: Never used  Substance Use Topics  . Alcohol use: No  . Drug use: No     Allergies   Patient has no known allergies.   Review of Systems Review of Systems   Physical Exam Triage Vital Signs ED Triage Vitals  Enc Vitals Group     BP 02/13/21 1343 (!) 142/93     Pulse Rate 02/13/21 1343 99     Resp 02/13/21 1343 (!) 22     Temp 02/13/21 1343 98.3 F (36.8 C)     Temp Source 02/13/21 1343 Oral     SpO2 02/13/21 1343 99 %     Weight --      Height --      Head Circumference --      Peak Flow --      Pain Score 02/13/21 1334 0     Pain Loc --      Pain Edu? --       Excl. in GC? --    No data found.  Updated Vital Signs BP (!) 142/93 (BP Location: Right Arm)   Pulse 99   Temp 98.3 F (36.8 C) (Oral)   Resp (!) 22   SpO2 99%   Visual Acuity Right Eye Distance:   Left Eye Distance:   Bilateral Distance:    Right Eye Near:   Left Eye Near:    Bilateral Near:     Physical Exam Constitutional:      Appearance: He is well-developed.  Cardiovascular:     Rate and Rhythm: Normal rate.  Pulmonary:     Effort: Pulmonary effort is normal.     Breath sounds: Normal breath sounds.  Musculoskeletal:     Right lower leg: No edema.     Left lower leg: No edema.  Skin:    General: Skin is warm and dry.  Neurological:     Mental Status: He is alert and oriented to person, place, and time.    EKG:  NSR rate of 85 . Previous EKG was available for review. No stwave changes as interpreted by me.    UC Treatments / Results  Labs (all labs ordered are listed, but only abnormal results are displayed) Labs Reviewed  CBC WITH DIFFERENTIAL/PLATELET  COMPREHENSIVE METABOLIC PANEL  BRAIN NATRIURETIC PEPTIDE  TSH    EKG   Radiology DG Chest 2 View  Result Date: 02/13/2021 CLINICAL DATA:  Shortness of breath and wheezing over the last 2 weeks. EXAM: CHEST - 2 VIEW COMPARISON:  None. FINDINGS: 08/22/2003 IMPRESSION: Suspicion of low level congestive heart failure. Mild cardiomegaly. Small to moderate left effusion with left lower lobe atelectasis and or pneumonia. Probable pulmonary venous hypertension. Small amount of fluid in the right posterior costophrenic angle. Electronically Signed   By: Paulina Fusi M.D.   On: 02/13/2021 14:36    Procedures Procedures (including critical care time)  Medications Ordered in UC Medications - No data to display  Initial Impression / Assessment and Plan / UC Course  I have reviewed the triage vital signs and the nursing notes.  Pertinent labs & imaging results that were available during my care of the  patient were reviewed by me and considered in my medical decision making (see chart for details).     Non toxic. Benign physical exam. No work of breathing.  No chest pain. ekg without acute findings here today. Afebrile. No tachypnea or hypoxia. No obvious weight gain, per patient, and no edema on exam or per patient. Will initiate antibiotics, per findings of chest xray, with labs, including BNP, pending. Chf to be considered, patient state he has cardiology appointment 3/15 for recheck. Er precautions discussed. Patient verbalized understanding and agreeable to plan.  Ambulatory out of clinic without difficulty.   Final Clinical Impressions(s) / UC Diagnoses   Final diagnoses:  Shortness of breath  Abnormal chest x-ray     Discharge Instructions     Your chest xray is abnormal today, there is concern for infection vs fluid buildup which can be related to your heart.  Be mindful of your fluid intake over the next few days and monitor your weight daily.  I will call you if your labs have any abnormal findings.  We will start antibiotics to treat any infection.  Please follow up with your cardiologist in the next 1-2 weeks for recheck as you likely will need further evaluation.  Go to the ER for any worsening of symptoms.     ED Prescriptions    Medication Sig Dispense Auth. Provider   amoxicillin-clavulanate (AUGMENTIN) 875-125 MG tablet Take 1 tablet by mouth every 12 (twelve) hours. 14 tablet Linus Mako B, NP   azithromycin (ZITHROMAX) 250 MG tablet Take 2 tablets (500 mg total) by mouth daily for 1 day, THEN 1 tablet (250 mg total) daily for 4 days. 6 tablet Georgetta Haber, NP     PDMP not reviewed this encounter.   Georgetta Haber, NP 02/13/21 1456

## 2021-02-15 NOTE — Telephone Encounter (Signed)
Pt was seen in ED on 3/7 for SOB and has f/u appt scheduled later this month.

## 2021-02-19 NOTE — Progress Notes (Signed)
Cardiology Office Note:    Date:  02/20/2021   ID:  Edward Simplerobert [name], DOB Dec 28, 1955, MRN [id number]  PCP:  Joaquim Namuncan, Graham S, MD  [facility] Cardiologist:  Donato SchultzMark Skains, MD  [facility] Electrophysiologist:  None   Chief Complaint: SOB  History of Present Illness:    Edward SimplerRobert [name] is a 65 y.o. male with a hx of CAD, HLD, and  hepatitis C seen for follow up.   Has coronary artery disease status post ST elevation myocardial infarction with drug-eluting stent x2 occluded circumflex and 80% mid RCA, Taxus in 2004.  EF 50 to 55%. Previously beta-blocker caused fatigue.  He also was on Lipitor then Crestor for about 6 months after the heart attack but felt like they cause fatigue so he stopped them. Previously followed by Dr. Shirlee LatchMcLean in 2011. Established care with Dr. Anne FuSkains 12/2018 and as needed follow up recommended   Seen in ER 02/13/21 for SOB. Not hypoxic. BNP 998. CXR infection vs fluids. Given RX of Augmentin & Azithromycin. Sent home.   Here today for further evaluation.  Presented with 2 weeks history of shortness of breath.  Patient reports that he was riding bicycle 3-4 times per week without any problem up until 2 weeks ago.  Since then patient reports persistent shortness of breath which got worse with laying down.  Some shortness of breath with activity.  No exertional chest tightness or pressure.  He reports classic substernal chest pressure with prior MI.  He also has PND.  Denies lower extremity edema, dizziness, palpitation or melena.  Reports normal blood pressure at home.  Reports stop smoking since his MI.  Denies alcohol drinking.  Patient reports 50% improvement in his symptoms after antibiotic therapy.    Past Medical History:  Diagnosis Date   CAD (coronary artery disease)    HCV antibody positive    [facility]   STEMI (ST elevation myocardial infarction) (HCC) 2004   with totally occluded CFX (infarct related artery) and 80% mRCA stenosis.  Taxus DES to both vessles. EF was 50-55% by LV-gram    Past Surgical History:  Procedure Laterality Date   CORONARY ANGIOPLASTY WITH STENT PLACEMENT  08/22/03    Current Medications: Current Meds  Medication Sig   aspirin 325 MG tablet Take by mouth.   furosemide (LASIX) 20 MG tablet Take one tablet by mouth daily for three days then take as needed for shortness of breath.   omeprazole (PRILOSEC) 20 MG capsule Take by mouth.     Allergies:   Patient has no known allergies.   Social History   Socioeconomic History   Marital status: Married    Spouse name: Efraim KaufmannCathy Greene   Number of children: 3   Years of education: 16   Highest education level: Not on file  Occupational History   Occupation: Retired  Tobacco Use   Smoking status: Former Smoker    Quit date: 12/11/2003    Years since quitting: 17.2   Smokeless tobacco: Never Used  Building services engineerVaping Use   Vaping Use: Never used  Substance and Sexual Activity   Alcohol use: No   Drug use: No   Sexual activity: Not on file  Other Topics Concern   Not on file  Social History Narrative   Left handed    Married, 1980   [place] in [place]   3 children   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 There is no history of Colon cancer or Prostate cancer.    ROS:   Please see the history of present illness.    All other systems reviewed and are negative.   EKGs/Labs/Other Studies Reviewed:     The following studies were reviewed today:  PROCEDURE: 08/2003  1. Left heart catheterization.  2. Selective coronary arteriography.  3. Selective left ventriculography.  4. Percutaneous stenting of the circumflex marginal coronary artery.   DESCRIPTION OF PROCEDURE:  The patient was brought to the catheterization  laboratory and prepped and draped in the usual fashion.  Integrilin was  running at the time of the procedure.  ACT was checked and was found to be  appropriate.  Views of the left and right coronary arteries were obtained in  multiple angiographic projections.  Ventriculography was performed in both  the RAO and LAO projection.  The patient was noted to have an occlusion of  the circumflex marginal.  A JL3.5 guiding catheter was then utilized to  intubate the left main.  A 7-French system was used.  A 0.014 high-torque  floppy wire was then shaped and used across the site of total occlusion.  Pre dilatation was done with a 2.25 mm balloon.  The lesion was subsequently  stented using a 2.5 x 20 mm length [employer] Taxus stent.  The stent  was then post dilated with a 2.5 mm noncompliant balloon.  There was  reestablishment of TIMI 3 flow.  The patient tolerated the procedure well.  All catheters were subsequently removed and the femoral sheath was sewn into  place.  He was taken to the CCU in satisfactory clinical condition.   HEMODYNAMIC DATA:  1. Central aortic pressure 142/86, mean 111.  2. Left ventricular pressure 135/9/21 .  3. No gradient on pullback across the aortic valve.   ANGIOGRAPHIC DATA:  1. The left main coronary artery was free of critical disease.  2. The left anterior descending artery coursed to the apex.  There was a 60%     area of segmental luminal irregularity noted in the proximal vessel     overlapping the origins of the two septal perforators.  There were distal     diagonal branches noted and the distal LAD wrapped the apex.   There was a     moderate amount of luminal irregularity noted throughout the LAD.  3. The circumflex provides a first marginal branch which is totally occluded     with TIMI 0 flow.  There is very faint opacification of the distal vessel     but this would not qualify for TIMI 1 flow.  There is segmental disease     of about 60% in the AV circumflex beyond the origin of the marginal     branch and this provides a smaller marginal system.  Following     reperfusion therapy and stenting, the total occlusion is reduced to 0%     residual luminal narrowing.  There is excellent TIMI 3 flow at the     completion of procedure.  4. The right coronary artery demonstrates a long area of 80% segmental     narrowing in the mid vessel.  The vessel is a large caliber vessel     appearing between 3.5-4 mm.  The vessel provides a posterior descending     branch which has about a segmental area of smooth 50-60% narrowing.     There is a first posterolateral branch that has about 30% irregularity     and a second posterolateral branch of about 50% irregularity.  5. The ventriculogram demonstrates well preserved left ventricular function     on the RAO study.  There is not a definite wall motion abnormality,     although there is a hint of anteroapical hypokinesis.  In the LAO     projection the inferolateral segment appears to be perhaps moderately     hypokinetic.   CONCLUSIONS:  Acute myocardial infarction due to occlusion of the circumflex  marginal branch with successful reperfusion therapy and stenting.   DISPOSITION:  The patient will be treated medically.  He will need to  discontinue smoking.  Lipid management will also be important.  He is being  treated with beta blockade as well as glycoprotein inhibitors and platelet  antagonists.  He will need at least six months of oral clopidogrel.  Elective percutaneous intervention of the right coronary artery later next  week will be undertaken given  the size of the right coronary artery and the  severity of the lesion.  EKG:  EKG is not  ordered today.  The ekg done 02/13/2021  demonstrates sinus rhythm, LVH, T wave inversion inferiorly  Recent Labs: 02/13/2021: ALT 58; B Natriuretic Peptide 998.0; BUN 16; Creatinine, Ser 1.69; Hemoglobin 17.2; Platelets 140; Potassium 4.5; Sodium 142; TSH 2.133  Recent Lipid Panel    Component Value Date/Time   CHOL 163 09/26/2020 1147   TRIG 70.0 09/26/2020 1147   HDL 33.60 (L) 09/26/2020 1147   CHOLHDL 5 09/26/2020 1147   VLDL 14.0 09/26/2020 1147   LDLCALC 116 (H) 09/26/2020 1147     Risk Assessment/Calculations:       Physical Exam:    VS:  BP 130/68    Pulse 88    Ht 5\' 9"  (1.753 m)    Wt 207 lb 6.4 oz (94.1 kg)    SpO2 98%    BMI 30.63 kg/m     Wt Readings from Last 3 Encounters:  02/20/21 207 lb 6.4 oz (94.1 kg)  10/03/20 216 lb 8 oz (98.2 kg)  03/01/20 220 lb 8 oz (100 kg)     GEN: Well nourished, well developed in no acute distress HEENT: Normal NECK: No JVD; No carotid bruits LYMPHATICS: No lymphadenopathy CARDIAC: RRR, no murmurs, rubs, gallops RESPIRATORY:  Clear to auscultation without rales, wheezing or rhonchi  ABDOMEN: Soft, non-tender, non-distended MUSCULOSKELETAL:  No edema; No deformity  SKIN: Warm and dry NEUROLOGIC:  Alert and oriented x 3 PSYCHIATRIC:  Normal affect   ASSESSMENT AND PLAN:   1.  Shortness of breath 2.  Orthopnea 3.  Elevated BNP  2 weeks history of shortness of breath with activity and laying down with orthopnea.  No lower extremity edema.  No exertional chest tightness or pressure.  Symptoms different from prior MI.  Reports using low-sodium diet.  Normal blood pressure.  BNP elevated while seen in urgent care.  EKG with somewhat new T wave  inversion in inferior leads.  After long discussion decided to start with noninvasive study.  Suspects symptoms could be due to CAD/ angina. Will get exercise Myoview and echocardiogram.  He will further  reduce his salt intake.  Trial of Lasix for few days.  Repeat blood work during the test. No exercise until next office visit.    4. CAD As above.  Advised to reduce aspirin to 81 mg daily.  5.  HLD -09/26/2020: Cholesterol 163; HDL 33.60; LDL Cholesterol 116; Triglycerides 70.0; VLDL 14.0  -Statin intolerance -Briefly discussed PCSK9 referral.  Further work-up based on study.  Shared Decision Making/Informed Consent The risks [chest pain, shortness of breath, cardiac arrhythmias, dizziness, blood pressure fluctuations, myocardial infarction, stroke/transient ischemic attack, nausea, vomiting, allergic reaction, radiation exposure, metallic taste sensation and life-threatening complications (estimated to be 1 in 10,000)], benefits (risk stratification, diagnosing coronary artery disease, treatment guidance) and alternatives of a nuclear stress test were discussed in detail with Mr. Vanblarcom and he agrees to proceed.     Medication Adjustments/Labs and Tests Ordered: Current medicines are reviewed at length with the patient today.  Concerns regarding medicines are outlined above.  Orders Placed This Encounter  Procedures   Basic metabolic panel   Pro b natriuretic peptide (BNP)   MYOCARDIAL PERFUSION IMAGING   ECHOCARDIOGRAM COMPLETE   Meds ordered this encounter  Medications   furosemide (LASIX) 20 MG tablet    Sig: Take one tablet by mouth daily for three days then take as needed for shortness of breath.    Dispense:  30 tablet    Refill:  11    Patient Instructions  Medication Instructions:  1. Start furosemide (Lasix) 20 mg by mouth daily for 3 days then take as needed for shortness of breath.   *If you need a refill on your cardiac medications before your next appointment, please call your pharmacy*   Lab Work: BMP and a BMET on the same day as your ECHO If you have labs (blood work) drawn today and your tests are completely normal, you will receive your results only  by:  MyChart Message (if you have MyChart) OR  A paper copy in the mail If you have any lab test that is abnormal or we need to change your treatment, we will call you to review the results.   Testing/Procedures: Your physician has requested that you have an echocardiogram. Echocardiography is a painless test that uses sound waves to create images of your heart. It provides your doctor with information about the size and shape of your heart and how well your hearts chambers and valves are working. This procedure takes approximately one hour. There are no restrictions for this procedure.  Your physician has requested that you have en exercise stress myoview. For further information please visit [email or web address] Please follow instruction sheet, as given.    Follow-Up: At [facility], you and your health needs are our priority.  As part of our continuing mission to provide you with exceptional heart care, we have created designated Provider Care Teams.  These Care Teams include your primary Cardiologist (physician) and Advanced Practice Providers (APPs -  Physician Assistants and Nurse Practitioners) who all work together to provide you with the care you need, when you need it.  We recommend signing up for the patient portal called "MyChart".  Sign up information is provided on this After Visit Summary.  MyChart is used to connect with patients for Virtual Visits (Telemedicine).  Patients are able to view  lab/test results, encounter notes, upcoming appointments, etc.  Non-urgent messages can be sent to your provider as well.   To learn more about what you can do with MyChart, go to [email or web address].    Your next appointment:   3 week(s)  The format for your next appointment:   In Person  Provider:   Donato Schultz, MD or Manson Passey, PA       Signed, [place], [place]  02/20/2021 9:45 AM    [facility]

## 2021-02-20 ENCOUNTER — Encounter: Payer: Self-pay | Admitting: Physician Assistant

## 2021-02-20 ENCOUNTER — Encounter: Payer: Self-pay | Admitting: *Deleted

## 2021-02-20 ENCOUNTER — Other Ambulatory Visit: Payer: Self-pay

## 2021-02-20 ENCOUNTER — Ambulatory Visit (INDEPENDENT_AMBULATORY_CARE_PROVIDER_SITE_OTHER): Payer: Medicare Other | Admitting: Physician Assistant

## 2021-02-20 VITALS — BP 130/68 | HR 88 | Ht 69.0 in | Wt 207.4 lb

## 2021-02-20 DIAGNOSIS — I2510 Atherosclerotic heart disease of native coronary artery without angina pectoris: Secondary | ICD-10-CM

## 2021-02-20 DIAGNOSIS — E782 Mixed hyperlipidemia: Secondary | ICD-10-CM | POA: Diagnosis not present

## 2021-02-20 DIAGNOSIS — R0609 Other forms of dyspnea: Secondary | ICD-10-CM

## 2021-02-20 DIAGNOSIS — R0601 Orthopnea: Secondary | ICD-10-CM

## 2021-02-20 DIAGNOSIS — R079 Chest pain, unspecified: Secondary | ICD-10-CM | POA: Diagnosis not present

## 2021-02-20 DIAGNOSIS — R0600 Dyspnea, unspecified: Secondary | ICD-10-CM | POA: Diagnosis not present

## 2021-02-20 MED ORDER — FUROSEMIDE 20 MG PO TABS: ORAL_TABLET | ORAL | 11 refills | Status: DC

## 2021-02-20 NOTE — Patient Instructions (Signed)
Medication Instructions:  1. Start furosemide (Lasix) 20 mg by mouth daily for 3 days then take as needed for shortness of breath.   *If you need a refill on your cardiac medications before your next appointment, please call your pharmacy*   Lab Work: BMP and a BMET on the same day as your ECHO If you have labs (blood work) drawn today and your tests are completely normal, you will receive your results only by: Marland Kitchen MyChart Message (if you have MyChart) OR . A paper copy in the mail If you have any lab test that is abnormal or we need to change your treatment, we will call you to review the results.   Testing/Procedures: Your physician has requested that you have an echocardiogram. Echocardiography is a painless test that uses sound waves to create images of your heart. It provides your doctor with information about the size and shape of your heart and how well your heart's chambers and valves are working. This procedure takes approximately one hour. There are no restrictions for this procedure.  Your physician has requested that you have en exercise stress myoview. For further information please visit [email or web address] Please follow instruction sheet, as given.    Follow-Up: At [facility], you and your health needs are our priority.  As part of our continuing mission to provide you with exceptional heart care, we have created designated Provider Care Teams.  These Care Teams include your primary Cardiologist (physician) and Advanced Practice Providers (APPs -  Physician Assistants and Nurse Practitioners) who all work together to provide you with the care you need, when you need it.  We recommend signing up for the patient portal called "MyChart".  Sign up information is provided on this After Visit Summary.  MyChart is used to connect with patients for Virtual Visits (Telemedicine).  Patients are able to view lab/test results, encounter notes, upcoming appointments, etc.  Non-urgent  messages can be sent to your provider as well.   To learn more about what you can do with MyChart, go to [email or web address].    Your next appointment:   3 week(s)  The format for your next appointment:   In Person  Provider:   Donato Schultz, MD or Manson Passey, [place]

## 2021-02-20 NOTE — Addendum Note (Signed)
Addended by: Valrie Hart on: 02/20/2021 11:43 AM   Modules accepted: Orders

## 2021-02-23 ENCOUNTER — Telehealth (HOSPITAL_COMMUNITY): Payer: Self-pay | Admitting: *Deleted

## 2021-02-23 ENCOUNTER — Other Ambulatory Visit (HOSPITAL_COMMUNITY)
Admission: RE | Admit: 2021-02-23 | Discharge: 2021-02-23 | Disposition: A | Discharge status: Home / Self Care | Payer: BC Managed Care – PPO | Source: Ambulatory Visit | Attending: Physician Assistant | Admitting: Physician Assistant

## 2021-02-23 NOTE — Telephone Encounter (Signed)
Patient given detailed instructions per Myocardial Perfusion Study Information Sheet for the test on 02/27/21 at 7:30. Patient notified to arrive 15 minutes early and that it is imperative to arrive on time for appointment to keep from having the test rescheduled.  If you need to cancel or reschedule your appointment, please call the office within 24 hours of your appointment. . Patient verbalized understanding.Daneil Dolin

## 2021-02-27 ENCOUNTER — Ambulatory Visit (HOSPITAL_COMMUNITY): Payer: Medicare Other | Attending: Cardiology

## 2021-02-27 ENCOUNTER — Other Ambulatory Visit: Payer: Self-pay

## 2021-02-27 DIAGNOSIS — R079 Chest pain, unspecified: Secondary | ICD-10-CM | POA: Insufficient documentation

## 2021-02-27 LAB — MYOCARDIAL PERFUSION IMAGING
Estimated workload: 5.8 METS
Exercise duration (min): 4 min
Exercise duration (sec): 0 s
LV dias vol: 273 mL (ref 62–150)
LV sys vol: 231 mL
MPHR: 155 {beats}/min
Peak HR: 142 {beats}/min
Percent HR: 91 %
Rest HR: 93 {beats}/min
SDS: 4
SRS: 2
SSS: 6
TID: 1.03

## 2021-02-27 MED ORDER — TECHNETIUM TC 99M TETROFOSMIN IV KIT
30.6000 | PACK | Freq: Once | INTRAVENOUS | Status: AC | PRN
  Administered 2021-02-27: 30.6 via INTRAVENOUS
  Filled 2021-02-27: qty 31

## 2021-02-27 MED ORDER — TECHNETIUM TC 99M TETROFOSMIN IV KIT
10.8000 | PACK | Freq: Once | INTRAVENOUS | Status: AC | PRN
  Administered 2021-02-27: 10.8 via INTRAVENOUS
  Filled 2021-02-27: qty 11

## 2021-03-01 ENCOUNTER — Telehealth: Payer: Self-pay | Admitting: Cardiology

## 2021-03-01 NOTE — Telephone Encounter (Signed)
Patient calling back to get results from his producer that was done on 3/21 please advise

## 2021-03-01 NOTE — Telephone Encounter (Signed)
Patient returning call.

## 2021-03-01 NOTE — Telephone Encounter (Signed)
Returned call to pt.  He has been made aware of his stress test results.  See result note.

## 2021-03-01 NOTE — Telephone Encounter (Signed)
Returned call to pt, left another message for him to call back.

## 2021-03-14 ENCOUNTER — Other Ambulatory Visit: Payer: Self-pay

## 2021-03-14 ENCOUNTER — Ambulatory Visit: Payer: Medicare Other | Admitting: Physician Assistant

## 2021-03-14 ENCOUNTER — Other Ambulatory Visit: Payer: Medicare Other

## 2021-03-14 ENCOUNTER — Ambulatory Visit (HOSPITAL_COMMUNITY): Payer: Medicare Other | Attending: Cardiology

## 2021-03-14 DIAGNOSIS — R0609 Other forms of dyspnea: Secondary | ICD-10-CM

## 2021-03-14 DIAGNOSIS — R0600 Dyspnea, unspecified: Secondary | ICD-10-CM

## 2021-03-14 DIAGNOSIS — E782 Mixed hyperlipidemia: Secondary | ICD-10-CM

## 2021-03-14 DIAGNOSIS — R0601 Orthopnea: Secondary | ICD-10-CM

## 2021-03-14 DIAGNOSIS — R079 Chest pain, unspecified: Secondary | ICD-10-CM | POA: Insufficient documentation

## 2021-03-14 DIAGNOSIS — I2510 Atherosclerotic heart disease of native coronary artery without angina pectoris: Secondary | ICD-10-CM

## 2021-03-14 LAB — ECHOCARDIOGRAM COMPLETE
Area-P 1/2: 6.32 cm2
MV M vel: 4.15 m/s
MV Peak grad: 68.9 mmHg
S' Lateral: 5.8 cm

## 2021-03-14 MED ORDER — PERFLUTREN LIPID MICROSPHERE
1.0000 mL | INTRAVENOUS | Status: AC | PRN
  Administered 2021-03-14: 2 mL via INTRAVENOUS

## 2021-03-15 LAB — BASIC METABOLIC PANEL
BUN/Creatinine Ratio: 13 (ref 10–24)
BUN: 22 mg/dL (ref 8–27)
CO2: 22 mmol/L (ref 20–29)
Calcium: 9.3 mg/dL (ref 8.6–10.2)
Chloride: 108 mmol/L — ABNORMAL HIGH (ref 96–106)
Creatinine, Ser: 1.73 mg/dL — ABNORMAL HIGH (ref 0.76–1.27)
Glucose: 127 mg/dL — ABNORMAL HIGH (ref 65–99)
Potassium: 4.9 mmol/L (ref 3.5–5.2)
Sodium: 144 mmol/L (ref 134–144)
eGFR: 43 mL/min/{1.73_m2} — ABNORMAL LOW (ref >59)

## 2021-03-15 LAB — PRO B NATRIURETIC PEPTIDE: NT-Pro BNP: 4235 pg/mL — ABNORMAL HIGH (ref 0–376)

## 2021-03-15 NOTE — H&P (View-Only) (Signed)
Cardiology Office Note:    Date:  03/16/2021   ID:  Edward [name], DOB Apr 02, 1956, MRN [id number]  PCP:  Edward Nam, MD   [facility]  Cardiologist:  Edward Schultz, MD  Advanced Practice Provider:  No care team member to display Electrophysiologist:  None       Referring MD: Edward Nam, MD    History of Present Illness:    Edward [name] is a 65 y.o. male here for the follow-up of recent echocardiogram and stress test both of which are abnormal with markedly reduced ejection fraction.  These were ordered after he was complaining of 2 weeks of worsening shortness of breath when riding his bicycle for instance.  He also had orthopnea as well.  Did not really have any chest tightness or pressure.  When he had prior MI in 2004 he had classic substernal chest pressure.  He has stopped smoking since MI and denies drinking alcohol.  Overall he says that his shortness of breath has gotten a little bit better after using the Lasix as needed he can actually lay down however he is still quite winded with NYHA class III-like symptoms.  No syncope.  Blood pressure is fairly low today.  Creatinine has been ranging as high as 1.7 currently.  Echocardiogram 03/14/2021:   1. Left ventricular ejection fraction, by estimation, is 20 to 25%. The  left ventricle has severely decreased function. The left ventricle  demonstrates global hypokinesis. The left ventricular internal cavity size  was severely dilated. There is mild  left ventricular hypertrophy. Left ventricular diastolic parameters are  consistent with Grade II diastolic dysfunction (pseudonormalization).  2. Right ventricular systolic function is normal. The right ventricular  size is mildly enlarged. There is mildly elevated pulmonary artery  systolic pressure.  3. Left atrial size was severely dilated.  4. The mitral valve is normal in structure. Moderate mitral valve  regurgitation. No evidence of  mitral stenosis.  5. The aortic valve is normal in structure. Aortic valve regurgitation is  not visualized. No aortic stenosis is present.  6. The inferior vena cava is dilated in size with >50% respiratory  variability, suggesting right atrial pressure of 8 mmHg.   NUC 02/27/21:    Nuclear stress EF: 15%.  The left ventricular ejection fraction is severely decreased (<30%).  Blood pressure demonstrated a hypertensive response to exercise.  There was no ST segment deviation noted during stress.  This is a high risk study.   1. EF 15% with diffuse hypokinesis.  2. Fixed moderate basal to mid inferior and inferolateral perfusion defect, possible prior infarction.  No ischemia.   High risk study due to markedly low EF.  Possible inferior/inferolateral infarct, but this does not explain extent of cardiomyopathy.    Has coronary artery disease status post ST elevation myocardial infarction with drug-eluting stent x2 occluded circumflex and 80% mid RCA, Taxus in 2004.  EF 50 to 55%.  Doing well since MI.  Previously beta-blocker caused fatigue.  He also was on Lipitor then Crestor for about 6 months after the heart attack but felt like they cause fatigue so he stopped them.  He has been counseled in the past by Dr. Para [name] as well on the benefits of statin.  [employer] representative.  History of hepatitis C antibody positive prior evaluation [facility] hepatology.    Past Medical History:  Diagnosis Date  . CAD (coronary artery disease)   . HCV antibody positive    previous eval  at [facility]ology clinic  . STEMI (ST elevation myocardial infarction) (HCC) 2004   with totally occluded CFX (infarct related artery) and 80% mRCA stenosis. Taxus DES to both vessles. EF was 50-55% by LV-gram    Past Surgical History:  Procedure Laterality Date  . CORONARY ANGIOPLASTY WITH STENT PLACEMENT  08/22/03    Current Medications: Current Meds  Medication Sig  . aspirin EC 81 MG tablet Take 81 mg by  mouth daily. Swallow whole.  . furosemide (LASIX) 20 MG tablet Take one tablet by mouth daily for three days then take as needed for shortness of breath.     Allergies:   Patient has no known allergies.   Social History   Socioeconomic History  . Marital status: Married    Spouse name: Edward [name]  . Number of children: 3  . Years of education: 84  . Highest education level: Not on file  Occupational History  . Occupation: Retired  Tobacco Use  . Smoking status: Former Smoker    Quit date: 12/11/2003    Years since quitting: 17.2  . Smokeless tobacco: Never Used  Vaping Use  . Vaping Use: Never used  Substance and Sexual Activity  . Alcohol use: No  . Drug use: No  . Sexual activity: Not on file  Other Topics Concern  . 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 There is no history of Colon cancer or Prostate cancer.  ROS:   Please see the history of present illness.    Positive for orthopnea, shortness of breath.  Denies chest pain.  Scott all other systems reviewed and are negative.  EKGs/Labs/Other Studies Reviewed:    The following studies were reviewed today: As above  EKG:  02/13/21 - SR 85 LBBB  Recent Labs: 02/13/2021: ALT 58; B Natriuretic Peptide 998.0; Hemoglobin 17.2; Platelets 140; TSH 2.133 03/14/2021: BUN 22; Creatinine, Ser  1.73; NT-Pro BNP 4,235; Potassium 4.9; Sodium 144  Recent Lipid Panel    Component Value Date/Time   CHOL 163 09/26/2020 1147   TRIG 70.0 09/26/2020 1147   HDL 33.60 (L) 09/26/2020 1147   CHOLHDL 5 09/26/2020 1147   VLDL 14.0 09/26/2020 1147   LDLCALC 116 (H) 09/26/2020 1147     Risk Assessment/Calculations:      Physical Exam:    VS:  BP 90/70 (BP Location: Left Arm, Patient Position: Sitting, Cuff Size: Normal)   Pulse 97   Ht 5\' 9"  (1.753 m)   Wt 201 lb (91.2 kg)   SpO2 97%   BMI 29.68 kg/m     Wt Readings from Last 3 Encounters:  03/16/21 201 lb (91.2 kg)  02/20/21 207 lb 6.4 oz (94.1 kg)  10/03/20 216 lb 8 oz (98.2 kg)  GEN:  Well nourished, well developed in no acute distress HEENT: Normal NECK: Mid neck JVD; No carotid bruits LYMPHATICS: No lymphadenopathy CARDIAC: RRR, no murmurs, rubs, gallops RESPIRATORY:  Clear to auscultation without rales, wheezing or rhonchi  ABDOMEN: Soft, non-tender, non-distended MUSCULOSKELETAL:  No edema; No deformity  SKIN: Warm and dry NEUROLOGIC:  Alert and oriented x 3 PSYCHIATRIC:  Normal affect   ASSESSMENT:    1. Acute systolic heart failure (HCC)   2. Coronary artery disease involving native coronary artery of native heart, unspecified whether angina present   3. Pre-procedure lab exam    PLAN:    In order of problems listed above:  Abnormal stress test/acute systolic heart failure -We will go ahead and set up for cardiac catheterization.  Right and left heart catheterization.  risks and benefits of been explained including stroke heart attack death renal impairment bleeding. -Previously had ST elevation with 2 drug-eluting stents to circumflex and 80% mid RCA.  Taxus stents were used, 2004.  His EF was 50 to 55% at the time. -Previously he had complaints about beta-blocker fatigue as well as Crestor fatigue. -Concerned about cardiac status, cardiac output.  Right and left heart cath to be beneficial.  Risks and  benefits of been explained including stroke heart attack death renal impairment bleeding.  He is willing to proceed.  Wife present for discussion. -I will also refer him to advanced heart failure clinic.  His blood pressure is too low currently to place on goal-directed medical therapy such as beta-blockers or Entresto for instance. -Renal impairment likely contributed by decreased cardiac output. TSH is normal. -Expressed to him that if symptoms worsen or become worrisome, we could hospitalize him.  In fact offered him hospitalization today for further to, consultations, heart catheterization.  Did not wish to proceed with this at this time.  Hyperlipidemia -May be able to tolerate PCSK9 therapy. Will address further post cath.   Shared Decision Making/Informed Consent The risks [stroke (1 in 1000), death (1 in 1000), kidney failure [usually temporary] (1 in 500), bleeding (1 in 200), allergic reaction [possibly serious] (1 in 200)], benefits (diagnostic support and management of coronary artery disease) and alternatives of a cardiac catheterization were discussed in detail with Mr. O[facility]ner and he is willing to proceed.      Medication Adjustments/Labs and Tests Ordered: Current medicines are reviewed at length with the patient today.  Concerns regarding medicines are outlined above.  Orders Placed This Encounter  Procedures  . CBC  . Basic metabolic panel   No orders of the defined types were placed in this encounter.   Patient Instructions  Medication Instructions:  The current medical regimen is effective;  continue present plan and medications.  *If you need a refill on your cardiac medications before your next appointment, please call your pharmacy*  Lab Work: Please have today (CBC. BMP)  If you have labs (blood work) drawn today and your tests are completely normal, you will receive your results only by: Marland Kitchen MyChart Message (if you have MyChart) OR . A paper copy in the  mail If you have any lab test that is abnormal or we need to change your treatment, we will call you to review the results.   Testing/Procedures:   [facility] CARDIOVASCULAR DIVISION [facility] ST OFFICE [address] [place] [address] Dept: [phone] Loc: [phone]  Alvarez Paccione  03/16/2021  You are scheduled for cardiac cath on Monday March 20, 2021 with Dr. Tresa Endo.  1. Please arrive at the [facility] (Main Entrance A) at [facility]: [address] [place], [place] [address] at 5:30 am (two hours before your procedure to ensure your preparation). Free valet parking service is available.   Special note: Every effort is made to have your procedure done on time. Please understand that emergencies sometimes delay scheduled procedures.  2. Diet: Nothing after midnight except medications with sips of water.  Do not take your Furosemide this AM.  3. Labs: Please have blood work today (CBC, BMP)    Covid screening as scheduled for Friday.  [address] [place], [place], [place],  4. Medication instructions in preparation for your procedure:  5. Plan for one night stay--bring personal belongings. 6. Bring a current list of your medications and current insurance cards. 7. You MUST have a responsible person to drive you home. 8. Someone MUST be with you the first 24 hours after you arrive home or your discharge will be delayed. 9. Please wear clothes that are easy to get on and off and wear slip-on shoes.  Thank you for allowing [place] to care for you!   -- [facility]   Follow-Up: At [facility], you and your health needs are our priority.  As part of our continuing mission to provide you with exceptional heart care, we have created designated Provider Care Teams.  These Care Teams include your primary Cardiologist (physician) and Advanced Practice Providers (APPs -  Physician Assistants  and Nurse Practitioners) who all work together to provide you with the care you need, when you need it.  We recommend signing up for the patient portal called "MyChart".  Sign up information is provided on this After Visit Summary.  MyChart is used to connect with patients for Virtual Visits (Telemedicine).  Patients are able to view lab/test results, encounter notes, upcoming appointments, etc.  Non-urgent messages can be sent to your provider as well.   To learn more about what you can do with MyChart, go to [email or web address].    Your next appointment:   Follow up with Advanced Heart Failure Clinic after your cardiac cath.  Thank you for choosing [facility]!!         Signed, Edward Schultz, MD  03/16/2021 3:55 PM    [facility]

## 2021-03-15 NOTE — Progress Notes (Signed)
Cardiology Office Note:    Date:  03/16/2021   ID:  Edward [name], DOB Apr 02, 1956, MRN [id number]  PCP:  Joaquim Nam, MD   [facility]  Cardiologist:  Donato Schultz, MD  Advanced Practice Provider:  No care team member to display Electrophysiologist:  None       Referring MD: Joaquim Nam, MD    History of Present Illness:    Edward [name] is a 65 y.o. male here for the follow-up of recent echocardiogram and stress test both of which are abnormal with markedly reduced ejection fraction.  These were ordered after he was complaining of 2 weeks of worsening shortness of breath when riding his bicycle for instance.  He also had orthopnea as well.  Did not really have any chest tightness or pressure.  When he had prior MI in 2004 he had classic substernal chest pressure.  He has stopped smoking since MI and denies drinking alcohol.  Overall he says that his shortness of breath has gotten a little bit better after using the Lasix as needed he can actually lay down however he is still quite winded with NYHA class III-like symptoms.  No syncope.  Blood pressure is fairly low today.  Creatinine has been ranging as high as 1.7 currently.  Echocardiogram 03/14/2021:   1. Left ventricular ejection fraction, by estimation, is 20 to 25%. The  left ventricle has severely decreased function. The left ventricle  demonstrates global hypokinesis. The left ventricular internal cavity size  was severely dilated. There is mild  left ventricular hypertrophy. Left ventricular diastolic parameters are  consistent with Grade II diastolic dysfunction (pseudonormalization).  2. Right ventricular systolic function is normal. The right ventricular  size is mildly enlarged. There is mildly elevated pulmonary artery  systolic pressure.  3. Left atrial size was severely dilated.  4. The mitral valve is normal in structure. Moderate mitral valve  regurgitation. No evidence of  mitral stenosis.  5. The aortic valve is normal in structure. Aortic valve regurgitation is  not visualized. No aortic stenosis is present.  6. The inferior vena cava is dilated in size with >50% respiratory  variability, suggesting right atrial pressure of 8 mmHg.   NUC 02/27/21:    Nuclear stress EF: 15%.  The left ventricular ejection fraction is severely decreased (<30%).  Blood pressure demonstrated a hypertensive response to exercise.  There was no ST segment deviation noted during stress.  This is a high risk study.   1. EF 15% with diffuse hypokinesis.  2. Fixed moderate basal to mid inferior and inferolateral perfusion defect, possible prior infarction.  No ischemia.   High risk study due to markedly low EF.  Possible inferior/inferolateral infarct, but this does not explain extent of cardiomyopathy.    Has coronary artery disease status post ST elevation myocardial infarction with drug-eluting stent x2 occluded circumflex and 80% mid RCA, Taxus in 2004.  EF 50 to 55%.  Doing well since MI.  Previously beta-blocker caused fatigue.  He also was on Lipitor then Crestor for about 6 months after the heart attack but felt like they cause fatigue so he stopped them.  He has been counseled in the past by Dr. Para March as well on the benefits of statin.  Works [employer] representative.  History of hepatitis C antibody positive prior evaluation [facility] hepatology.    Past Medical History:  Diagnosis Date  . CAD (coronary artery disease)   . HCV antibody positive    previous eval  at [facility] hepat[facility] clinic  . STEMI (ST elevation myocardial infarction) (HCC) 2004   with totally occluded CFX (infarct related artery) and 80% mRCA stenosis. Taxus DES to both vessles. EF was 50-55% by LV-gram    Past Surgical History:  Procedure Laterality Date  . CORONARY ANGIOPLASTY WITH STENT PLACEMENT  08/22/03    Current Medications: Current Meds  Medication Sig  . aspirin EC 81 MG tablet Take 81 mg by  mouth daily. Swallow whole.  . furosemide (LASIX) 20 MG tablet Take one tablet by mouth daily for three days then take as needed for shortness of breath.     Allergies:   Patient has no known allergies.   Social History   Socioeconomic History  . Marital status: Married    Spouse name: Edward [name]  . Number of children: 3  . Years of education: 84  . Highest education level: Not on file  Occupational History  . Occupation: Retired  Tobacco Use  . Smoking status: Former Smoker    Quit date: 12/11/2003    Years since quitting: 17.2  . Smokeless tobacco: Never Used  Vaping Use  . Vaping Use: Never used  Substance and Sexual Activity  . Alcohol use: No  . Drug use: No  . Sexual activity: Not on file  Other Topics Concern  . 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 There is no history of Colon cancer or Prostate cancer.  ROS:   Please see the history of present illness.    Positive for orthopnea, shortness of breath.  Denies chest pain.  Scott all other systems reviewed and are negative.  EKGs/Labs/Other Studies Reviewed:    The following studies were reviewed today: As above  EKG:  02/13/21 - SR 85 LBBB  Recent Labs: 02/13/2021: ALT 58; B Natriuretic Peptide 998.0; Hemoglobin 17.2; Platelets 140; TSH 2.133 03/14/2021: BUN 22; Creatinine, Ser  1.73; NT-Pro BNP 4,235; Potassium 4.9; Sodium 144  Recent Lipid Panel    Component Value Date/Time   CHOL 163 09/26/2020 1147   TRIG 70.0 09/26/2020 1147   HDL 33.60 (L) 09/26/2020 1147   CHOLHDL 5 09/26/2020 1147   VLDL 14.0 09/26/2020 1147   LDLCALC 116 (H) 09/26/2020 1147     Risk Assessment/Calculations:      Physical Exam:    VS:  BP 90/70 (BP Location: Left Arm, Patient Position: Sitting, Cuff Size: Normal)   Pulse 97   Ht 5\' 9"  (1.753 m)   Wt 201 lb (91.2 kg)   SpO2 97%   BMI 29.68 kg/m     Wt Readings from Last 3 Encounters:  03/16/21 201 lb (91.2 kg)  02/20/21 207 lb 6.4 oz (94.1 kg)  10/03/20 216 lb 8 oz (98.2 kg)  GEN:  Well nourished, well developed in no acute distress HEENT: Normal NECK: Mid neck JVD; No carotid bruits LYMPHATICS: No lymphadenopathy CARDIAC: RRR, no murmurs, rubs, gallops RESPIRATORY:  Clear to auscultation without rales, wheezing or rhonchi  ABDOMEN: Soft, non-tender, non-distended MUSCULOSKELETAL:  No edema; No deformity  SKIN: Warm and dry NEUROLOGIC:  Alert and oriented x 3 PSYCHIATRIC:  Normal affect   ASSESSMENT:    1. Acute systolic heart failure (HCC)   2. Coronary artery disease involving native coronary artery of native heart, unspecified whether angina present   3. Pre-procedure lab exam    PLAN:    In order of problems listed above:  Abnormal stress test/acute systolic heart failure -We will go ahead and set up for cardiac catheterization.  Right and left heart catheterization.  risks and benefits of been explained including stroke heart attack death renal impairment bleeding. -Previously had ST elevation with 2 drug-eluting stents to circumflex and 80% mid RCA.  Taxus stents were used, 2004.  His EF was 50 to 55% at the time. -Previously he had complaints about beta-blocker fatigue as well as Crestor fatigue. -Concerned about cardiac status, cardiac output.  Right and left heart cath to be beneficial.  Risks and  benefits of been explained including stroke heart attack death renal impairment bleeding.  He is willing to proceed.  Wife present for discussion. -I will also refer him to advanced heart failure clinic.  His blood pressure is too low currently to place on goal-directed medical therapy such as beta-blockers or Entresto for instance. -Renal impairment likely contributed by decreased cardiac output. TSH is normal. -Expressed to him that if symptoms worsen or become worrisome, we could hospitalize him.  In fact offered him hospitalization today for further to, consultations, heart catheterization.  Did not wish to proceed with this at this time.  Hyperlipidemia -May be able to tolerate PCSK9 therapy. Will address further post cath.   Shared Decision Making/Informed Consent The risks [stroke (1 in 1000), death (1 in 1000), kidney failure [usually temporary] (1 in 500), bleeding (1 in 200), allergic reaction [possibly serious] (1 in 200)], benefits (diagnostic support and management of coronary artery disease) and alternatives of a cardiac catheterization were discussed in detail with Mr. Knoebel and he is willing to proceed.      Medication Adjustments/Labs and Tests Ordered: Current medicines are reviewed at length with the patient today.  Concerns regarding medicines are outlined above.  Orders Placed This Encounter  Procedures  . CBC  . Basic metabolic panel   No orders of the defined types were placed in this encounter.   Patient Instructions  Medication Instructions:  The current medical regimen is effective;  continue present plan and medications.  *If you need a refill on your cardiac medications before your next appointment, please call your pharmacy*  Lab Work: Please have today (CBC. BMP)  If you have labs (blood work) drawn today and your tests are completely normal, you will receive your results only by: . MyChart Message (if you have MyChart) OR . A paper copy in the  mail If you have any lab test that is abnormal or we need to change your treatment, we will call you to review the results.   Testing/Procedures:   [facility] [address] OFF[facility] [address], [address]  [place] [address] Dept: [phone] Loc: [phone]  Gaelen Pelland  03/16/2021  You are scheduled for cardiac cath on Monday March 20, 2021 with Dr. Tresa Endo.  1. Please arrive at the [facility] (Main Entrance A) at [facility]: [address] [place], [place] [address] at 5:30 am (two hours before your procedure to ensure your preparation). Free valet parking service is available.   Special note: Every effort is made to have your procedure done on time. Please understand that emergencies sometimes delay scheduled procedures.  2. Diet: Nothing after midnight except medications with sips of water.  Do not take your Furosemide this AM.  3. Labs: Please have blood work today (CBC, BMP)    Covid screening as scheduled for Friday.  [address] [place], [place], [place],  4. Medication instructions in preparation for your procedure:  5. Plan for one night stay--bring personal belongings. 6. Bring a current list of your medications and current insurance cards. 7. You MUST have a responsible person to drive you home. 8. Someone MUST be with you the first 24 hours after you arrive home or your discharge will be delayed. 9. Please wear clothes that are easy to get on and off and wear slip-on shoes.  Thank you for allowing [place] to care for you!   -- [facility]   Follow-Up: At [facility], you and your health needs are our priority.  As part of our continuing mission to provide you with exceptional heart care, we have created designated Provider Care Teams.  These Care Teams include your primary Cardiologist (physician) and Advanced Practice Providers (APPs -  Physician Assistants  and Nurse Practitioners) who all work together to provide you with the care you need, when you need it.  We recommend signing up for the patient portal called "MyChart".  Sign up information is provided on this After Visit Summary.  MyChart is used to connect with patients for Virtual Visits (Telemedicine).  Patients are able to view lab/test results, encounter notes, upcoming appointments, etc.  Non-urgent messages can be sent to your provider as well.   To learn more about what you can do with MyChart, go to [email or web address].    Your next appointment:   Follow up with Advanced Heart Failure Clinic after your cardiac cath.  Thank you for choosing [facility]!!         Signed, Donato Schultz, MD  03/16/2021 3:55 PM    [facility]

## 2021-03-16 ENCOUNTER — Encounter: Payer: Self-pay | Admitting: Cardiology

## 2021-03-16 ENCOUNTER — Ambulatory Visit (INDEPENDENT_AMBULATORY_CARE_PROVIDER_SITE_OTHER): Payer: Medicare Other | Admitting: Cardiology

## 2021-03-16 ENCOUNTER — Other Ambulatory Visit: Payer: Self-pay

## 2021-03-16 VITALS — BP 90/70 | HR 97 | Ht 69.0 in | Wt 201.0 lb

## 2021-03-16 DIAGNOSIS — I5021 Acute systolic (congestive) heart failure: Secondary | ICD-10-CM | POA: Diagnosis not present

## 2021-03-16 DIAGNOSIS — Z01812 Encounter for preprocedural laboratory examination: Secondary | ICD-10-CM

## 2021-03-16 DIAGNOSIS — I2510 Atherosclerotic heart disease of native coronary artery without angina pectoris: Secondary | ICD-10-CM

## 2021-03-16 NOTE — Patient Instructions (Addendum)
Medication Instructions:  The current medical regimen is effective;  continue present plan and medications.  *If you need a refill on your cardiac medications before your next appointment, please call your pharmacy*  Lab Work: Please have today (CBC. BMP)  If you have labs (blood work) drawn today and your tests are completely normal, you will receive your results only by: Marland Kitchen MyChart Message (if you have MyChart) OR . A paper copy in the mail If you have any lab test that is abnormal or we need to change your treatment, we will call you to review the results.   Testing/Procedures:   [facility]ARDIOVASCULAR DIV[facility] OFF[facility] [address] [place] [address] Dept: [phone] Loc: [phone]  Remiel Corti  03/16/2021  You are scheduled for cardiac cath on Monday March 20, 2021 with Dr. Tresa Endo.  1. Please arrive at the [facility] (Main Entrance A) at [facility]: [address] [place], [place] [address] at 5:30 am (two hours before your procedure to ensure your preparation). Free valet parking service is available.   Special note: Every effort is made to have your procedure done on time. Please understand that emergencies sometimes delay scheduled procedures.  2. Diet: Nothing after midnight except medications with sips of water.  Do not take your Furosemide this AM.  3. Labs: Please have blood work today (CBC, BMP)    Covid screening as scheduled for Friday.  [address] [place], [place], [place],  4. Medication instructions in preparation for your procedure:  5. Plan for one night stay--bring personal belongings. 6. Bring a current list of your medications and current insurance cards. 7. You MUST have a responsible person to drive you home. 8. Someone MUST be with you the first 24 hours after you arrive home or your discharge will be delayed. 9. Please wear clothes that are easy to get on and off and  wear slip-on shoes.  Thank you for allowing [place] to care for you!   -- [place] [facility]   Follow-Up: At [facility], you and your health needs are our priority.  As part of our continuing mission to provide you with exceptional heart care, we have created designated Provider Care Teams.  These Care Teams include your primary Cardiologist (physician) and Advanced Practice Providers (APPs -  Physician Assistants and Nurse Practitioners) who all work together to provide you with the care you need, when you need it.  We recommend signing up for the patient portal called "MyChart".  Sign up information is provided on this After Visit Summary.  MyChart is used to connect with patients for Virtual Visits (Telemedicine).  Patients are able to view lab/test results, encounter notes, upcoming appointments, etc.  Non-urgent messages can be sent to your provider as well.   To learn more about what you can do with MyChart, go to [email or web address].    Your next appointment:   Follow up with Advanced Heart Failure Clinic after your cardiac cath.  Thank you for choosing [facility]!!     Advanced Heart Failure Clinic INFORMATION: Your appointment is scheduled on:      at    . Please arrive 15 minutes early for check-in. The Heart Failure clinic is located in the Heart and Vascular Specialty Clinics at [facility]. Parking instructions/directions: Government social research officer C ([address]). When you pull in to Entrance C, there is an underground parking garage to your  right. The code to enter the garage is 2231. Take the elevators to the first floor. Follow the signs to the Heart and Vascular Specialty Clinics. You will see registration at the end of the hallway.  Phone number: [phone]  Dr Arvilla Meres Dr Marca Ancona

## 2021-03-17 ENCOUNTER — Other Ambulatory Visit (HOSPITAL_COMMUNITY)
Admission: RE | Admit: 2021-03-17 | Discharge: 2021-03-17 | Disposition: A | Discharge status: Home / Self Care | Payer: Medicare Other | Source: Ambulatory Visit | Attending: Cardiovascular Disease | Admitting: Cardiovascular Disease

## 2021-03-17 DIAGNOSIS — I25110 Atherosclerotic heart disease of native coronary artery with unstable angina pectoris: Secondary | ICD-10-CM | POA: Diagnosis not present

## 2021-03-17 DIAGNOSIS — N179 Acute kidney failure, unspecified: Secondary | ICD-10-CM | POA: Diagnosis not present

## 2021-03-17 DIAGNOSIS — Z955 Presence of coronary angioplasty implant and graft: Secondary | ICD-10-CM | POA: Diagnosis not present

## 2021-03-17 DIAGNOSIS — D696 Thrombocytopenia, unspecified: Secondary | ICD-10-CM | POA: Diagnosis not present

## 2021-03-17 DIAGNOSIS — Z87891 Personal history of nicotine dependence: Secondary | ICD-10-CM | POA: Diagnosis not present

## 2021-03-17 DIAGNOSIS — E785 Hyperlipidemia, unspecified: Secondary | ICD-10-CM | POA: Diagnosis not present

## 2021-03-17 DIAGNOSIS — N1831 Chronic kidney disease, stage 3a: Secondary | ICD-10-CM | POA: Diagnosis not present

## 2021-03-17 DIAGNOSIS — Z01812 Encounter for preprocedural laboratory examination: Secondary | ICD-10-CM | POA: Insufficient documentation

## 2021-03-17 DIAGNOSIS — I5023 Acute on chronic systolic (congestive) heart failure: Secondary | ICD-10-CM | POA: Diagnosis not present

## 2021-03-17 DIAGNOSIS — J90 Pleural effusion, not elsewhere classified: Secondary | ICD-10-CM | POA: Diagnosis not present

## 2021-03-17 DIAGNOSIS — Z833 Family history of diabetes mellitus: Secondary | ICD-10-CM | POA: Diagnosis not present

## 2021-03-17 DIAGNOSIS — I5043 Acute on chronic combined systolic (congestive) and diastolic (congestive) heart failure: Secondary | ICD-10-CM | POA: Diagnosis not present

## 2021-03-17 DIAGNOSIS — Z8249 Family history of ischemic heart disease and other diseases of the circulatory system: Secondary | ICD-10-CM | POA: Diagnosis not present

## 2021-03-17 DIAGNOSIS — Z20822 Contact with and (suspected) exposure to covid-19: Secondary | ICD-10-CM | POA: Insufficient documentation

## 2021-03-17 DIAGNOSIS — I5021 Acute systolic (congestive) heart failure: Secondary | ICD-10-CM | POA: Diagnosis not present

## 2021-03-17 DIAGNOSIS — I2582 Chronic total occlusion of coronary artery: Secondary | ICD-10-CM | POA: Diagnosis not present

## 2021-03-17 DIAGNOSIS — T82855A Stenosis of coronary artery stent, initial encounter: Secondary | ICD-10-CM | POA: Diagnosis not present

## 2021-03-17 DIAGNOSIS — J939 Pneumothorax, unspecified: Secondary | ICD-10-CM | POA: Diagnosis not present

## 2021-03-17 DIAGNOSIS — I25119 Atherosclerotic heart disease of native coronary artery with unspecified angina pectoris: Secondary | ICD-10-CM | POA: Diagnosis not present

## 2021-03-17 DIAGNOSIS — Z23 Encounter for immunization: Secondary | ICD-10-CM | POA: Diagnosis not present

## 2021-03-17 DIAGNOSIS — Z79899 Other long term (current) drug therapy: Secondary | ICD-10-CM | POA: Diagnosis not present

## 2021-03-17 DIAGNOSIS — R7303 Prediabetes: Secondary | ICD-10-CM | POA: Diagnosis not present

## 2021-03-17 DIAGNOSIS — D62 Acute posthemorrhagic anemia: Secondary | ICD-10-CM | POA: Diagnosis not present

## 2021-03-17 DIAGNOSIS — J9811 Atelectasis: Secondary | ICD-10-CM | POA: Diagnosis not present

## 2021-03-17 DIAGNOSIS — K5900 Constipation, unspecified: Secondary | ICD-10-CM | POA: Diagnosis not present

## 2021-03-17 DIAGNOSIS — R079 Chest pain, unspecified: Secondary | ICD-10-CM | POA: Diagnosis not present

## 2021-03-17 DIAGNOSIS — I252 Old myocardial infarction: Secondary | ICD-10-CM | POA: Diagnosis not present

## 2021-03-17 DIAGNOSIS — Z01810 Encounter for preprocedural cardiovascular examination: Secondary | ICD-10-CM | POA: Diagnosis not present

## 2021-03-17 DIAGNOSIS — Y831 Surgical operation with implant of artificial internal device as the cause of abnormal reaction of the patient, or of later complication, without mention of misadventure at the time of the procedure: Secondary | ICD-10-CM | POA: Diagnosis present

## 2021-03-17 DIAGNOSIS — D6959 Other secondary thrombocytopenia: Secondary | ICD-10-CM | POA: Diagnosis not present

## 2021-03-17 DIAGNOSIS — R0602 Shortness of breath: Secondary | ICD-10-CM | POA: Diagnosis not present

## 2021-03-17 DIAGNOSIS — E1122 Type 2 diabetes mellitus with diabetic chronic kidney disease: Secondary | ICD-10-CM | POA: Diagnosis not present

## 2021-03-17 DIAGNOSIS — R918 Other nonspecific abnormal finding of lung field: Secondary | ICD-10-CM | POA: Diagnosis not present

## 2021-03-17 DIAGNOSIS — I517 Cardiomegaly: Secondary | ICD-10-CM | POA: Diagnosis not present

## 2021-03-17 DIAGNOSIS — I130 Hypertensive heart and chronic kidney disease with heart failure and stage 1 through stage 4 chronic kidney disease, or unspecified chronic kidney disease: Secondary | ICD-10-CM | POA: Diagnosis not present

## 2021-03-17 DIAGNOSIS — I272 Pulmonary hypertension, unspecified: Secondary | ICD-10-CM | POA: Diagnosis not present

## 2021-03-17 DIAGNOSIS — I2510 Atherosclerotic heart disease of native coronary artery without angina pectoris: Secondary | ICD-10-CM | POA: Diagnosis not present

## 2021-03-17 DIAGNOSIS — R570 Cardiogenic shock: Secondary | ICD-10-CM | POA: Diagnosis not present

## 2021-03-17 DIAGNOSIS — I255 Ischemic cardiomyopathy: Secondary | ICD-10-CM | POA: Diagnosis not present

## 2021-03-17 LAB — CBC
Hematocrit: 51.2 % — ABNORMAL HIGH (ref 37.5–51.0)
Hemoglobin: 17.1 g/dL (ref 13.0–17.7)
MCH: 29.4 pg (ref 26.6–33.0)
MCHC: 33.4 g/dL (ref 31.5–35.7)
MCV: 88 fL (ref 79–97)
Platelets: 141 10*3/uL — ABNORMAL LOW (ref 150–450)
RBC: 5.81 x10E6/uL — ABNORMAL HIGH (ref 4.14–5.80)
RDW: 12.9 % (ref 11.6–15.4)
WBC: 5.5 10*3/uL (ref 3.4–10.8)

## 2021-03-17 LAB — BASIC METABOLIC PANEL
BUN/Creatinine Ratio: 15 (ref 10–24)
BUN: 24 mg/dL (ref 8–27)
CO2: 19 mmol/L — ABNORMAL LOW (ref 20–29)
Calcium: 9.9 mg/dL (ref 8.6–10.2)
Chloride: 106 mmol/L (ref 96–106)
Creatinine, Ser: 1.61 mg/dL — ABNORMAL HIGH (ref 0.76–1.27)
Glucose: 89 mg/dL (ref 65–99)
Potassium: 4.8 mmol/L (ref 3.5–5.2)
Sodium: 149 mmol/L — ABNORMAL HIGH (ref 134–144)
eGFR: 47 mL/min/{1.73_m2} — ABNORMAL LOW (ref >59)

## 2021-03-18 LAB — SARS CORONAVIRUS 2 (TAT 6-24 HRS): SARS Coronavirus 2: NEGATIVE

## 2021-03-20 ENCOUNTER — Other Ambulatory Visit: Payer: Self-pay

## 2021-03-20 ENCOUNTER — Encounter (HOSPITAL_COMMUNITY): Payer: Self-pay | Admitting: Cardiovascular Disease

## 2021-03-20 ENCOUNTER — Encounter (HOSPITAL_COMMUNITY)
Admission: RE | Disposition: A | Discharge status: Home / Self Care | Payer: Self-pay | Source: Home / Self Care | Attending: Thoracic Surgery (Cardiothoracic Vascular Surgery)

## 2021-03-20 ENCOUNTER — Inpatient Hospital Stay (HOSPITAL_COMMUNITY): Payer: Medicare Other

## 2021-03-20 ENCOUNTER — Inpatient Hospital Stay (HOSPITAL_COMMUNITY)
Admission: RE | Admit: 2021-03-20 | Discharge: 2021-04-01 | DRG: 233 | Disposition: A | Discharge status: Home / Self Care | Payer: Medicare Other | Attending: Thoracic Surgery (Cardiothoracic Vascular Surgery) | Admitting: Thoracic Surgery (Cardiothoracic Vascular Surgery)

## 2021-03-20 DIAGNOSIS — I25110 Atherosclerotic heart disease of native coronary artery with unstable angina pectoris: Secondary | ICD-10-CM | POA: Diagnosis not present

## 2021-03-20 DIAGNOSIS — D62 Acute posthemorrhagic anemia: Secondary | ICD-10-CM | POA: Diagnosis not present

## 2021-03-20 DIAGNOSIS — N1831 Chronic kidney disease, stage 3a: Secondary | ICD-10-CM | POA: Diagnosis present

## 2021-03-20 DIAGNOSIS — I255 Ischemic cardiomyopathy: Secondary | ICD-10-CM | POA: Diagnosis present

## 2021-03-20 DIAGNOSIS — I272 Pulmonary hypertension, unspecified: Secondary | ICD-10-CM | POA: Diagnosis present

## 2021-03-20 DIAGNOSIS — I2582 Chronic total occlusion of coronary artery: Secondary | ICD-10-CM | POA: Diagnosis present

## 2021-03-20 DIAGNOSIS — I5023 Acute on chronic systolic (congestive) heart failure: Secondary | ICD-10-CM | POA: Diagnosis not present

## 2021-03-20 DIAGNOSIS — I252 Old myocardial infarction: Secondary | ICD-10-CM | POA: Diagnosis not present

## 2021-03-20 DIAGNOSIS — K5900 Constipation, unspecified: Secondary | ICD-10-CM | POA: Diagnosis present

## 2021-03-20 DIAGNOSIS — I2510 Atherosclerotic heart disease of native coronary artery without angina pectoris: Principal | ICD-10-CM | POA: Diagnosis present

## 2021-03-20 DIAGNOSIS — R7303 Prediabetes: Secondary | ICD-10-CM | POA: Diagnosis present

## 2021-03-20 DIAGNOSIS — I25119 Atherosclerotic heart disease of native coronary artery with unspecified angina pectoris: Secondary | ICD-10-CM | POA: Diagnosis not present

## 2021-03-20 DIAGNOSIS — Z20822 Contact with and (suspected) exposure to covid-19: Secondary | ICD-10-CM | POA: Diagnosis present

## 2021-03-20 DIAGNOSIS — Z823 Family history of stroke: Secondary | ICD-10-CM

## 2021-03-20 DIAGNOSIS — Z01812 Encounter for preprocedural laboratory examination: Secondary | ICD-10-CM

## 2021-03-20 DIAGNOSIS — Z955 Presence of coronary angioplasty implant and graft: Secondary | ICD-10-CM | POA: Diagnosis not present

## 2021-03-20 DIAGNOSIS — E785 Hyperlipidemia, unspecified: Secondary | ICD-10-CM | POA: Diagnosis present

## 2021-03-20 DIAGNOSIS — D6959 Other secondary thrombocytopenia: Secondary | ICD-10-CM | POA: Diagnosis not present

## 2021-03-20 DIAGNOSIS — Z833 Family history of diabetes mellitus: Secondary | ICD-10-CM | POA: Diagnosis not present

## 2021-03-20 DIAGNOSIS — Z87891 Personal history of nicotine dependence: Secondary | ICD-10-CM | POA: Diagnosis not present

## 2021-03-20 DIAGNOSIS — Z79899 Other long term (current) drug therapy: Secondary | ICD-10-CM | POA: Diagnosis not present

## 2021-03-20 DIAGNOSIS — Z8249 Family history of ischemic heart disease and other diseases of the circulatory system: Secondary | ICD-10-CM

## 2021-03-20 DIAGNOSIS — Y831 Surgical operation with implant of artificial internal device as the cause of abnormal reaction of the patient, or of later complication, without mention of misadventure at the time of the procedure: Secondary | ICD-10-CM | POA: Diagnosis present

## 2021-03-20 DIAGNOSIS — E1122 Type 2 diabetes mellitus with diabetic chronic kidney disease: Secondary | ICD-10-CM | POA: Diagnosis present

## 2021-03-20 DIAGNOSIS — T82855A Stenosis of coronary artery stent, initial encounter: Secondary | ICD-10-CM | POA: Diagnosis present

## 2021-03-20 DIAGNOSIS — Z9889 Other specified postprocedural states: Secondary | ICD-10-CM

## 2021-03-20 DIAGNOSIS — Z01810 Encounter for preprocedural cardiovascular examination: Secondary | ICD-10-CM

## 2021-03-20 DIAGNOSIS — I130 Hypertensive heart and chronic kidney disease with heart failure and stage 1 through stage 4 chronic kidney disease, or unspecified chronic kidney disease: Secondary | ICD-10-CM | POA: Diagnosis present

## 2021-03-20 DIAGNOSIS — N179 Acute kidney failure, unspecified: Secondary | ICD-10-CM | POA: Diagnosis not present

## 2021-03-20 DIAGNOSIS — J939 Pneumothorax, unspecified: Secondary | ICD-10-CM

## 2021-03-20 DIAGNOSIS — I5043 Acute on chronic combined systolic (congestive) and diastolic (congestive) heart failure: Secondary | ICD-10-CM | POA: Diagnosis not present

## 2021-03-20 DIAGNOSIS — Z7982 Long term (current) use of aspirin: Secondary | ICD-10-CM

## 2021-03-20 DIAGNOSIS — R570 Cardiogenic shock: Secondary | ICD-10-CM | POA: Diagnosis not present

## 2021-03-20 DIAGNOSIS — I5021 Acute systolic (congestive) heart failure: Secondary | ICD-10-CM

## 2021-03-20 DIAGNOSIS — I340 Nonrheumatic mitral (valve) insufficiency: Secondary | ICD-10-CM | POA: Diagnosis present

## 2021-03-20 DIAGNOSIS — J90 Pleural effusion, not elsewhere classified: Secondary | ICD-10-CM

## 2021-03-20 DIAGNOSIS — Z23 Encounter for immunization: Secondary | ICD-10-CM

## 2021-03-20 HISTORY — PX: RIGHT/LEFT HEART CATH AND CORONARY ANGIOGRAPHY: CATH118266

## 2021-03-20 LAB — POCT I-STAT 7, (LYTES, BLD GAS, ICA,H+H)
Acid-base deficit: 2 mmol/L (ref 0.0–2.0)
Bicarbonate: 22.7 mmol/L (ref 20.0–28.0)
Calcium, Ion: 1.16 mmol/L (ref 1.15–1.40)
HCT: 47 % (ref 39.0–52.0)
Hemoglobin: 16 g/dL (ref 13.0–17.0)
O2 Saturation: 97 %
Potassium: 4.4 mmol/L (ref 3.5–5.1)
Sodium: 143 mmol/L (ref 135–145)
TCO2: 24 mmol/L (ref 22–32)
pCO2 arterial: 38.4 mmHg (ref 32.0–48.0)
pH, Arterial: 7.381 (ref 7.350–7.450)
pO2, Arterial: 90 mmHg (ref 83.0–108.0)

## 2021-03-20 LAB — POCT I-STAT EG7
Acid-Base Excess: 0 mmol/L (ref 0.0–2.0)
Acid-Base Excess: 0 mmol/L (ref 0.0–2.0)
Bicarbonate: 26.4 mmol/L (ref 20.0–28.0)
Bicarbonate: 26.5 mmol/L (ref 20.0–28.0)
Calcium, Ion: 1.27 mmol/L (ref 1.15–1.40)
Calcium, Ion: 1.3 mmol/L (ref 1.15–1.40)
HCT: 47 % (ref 39.0–52.0)
HCT: 47 % (ref 39.0–52.0)
Hemoglobin: 16 g/dL (ref 13.0–17.0)
Hemoglobin: 16 g/dL (ref 13.0–17.0)
O2 Saturation: 63 %
O2 Saturation: 64 %
Potassium: 4.3 mmol/L (ref 3.5–5.1)
Potassium: 4.3 mmol/L (ref 3.5–5.1)
Sodium: 143 mmol/L (ref 135–145)
Sodium: 143 mmol/L (ref 135–145)
TCO2: 28 mmol/L (ref 22–32)
TCO2: 28 mmol/L (ref 22–32)
pCO2, Ven: 50.2 mmHg (ref 44.0–60.0)
pCO2, Ven: 50.4 mmHg (ref 44.0–60.0)
pH, Ven: 7.328 (ref 7.250–7.430)
pH, Ven: 7.33 (ref 7.250–7.430)
pO2, Ven: 35 mmHg (ref 32.0–45.0)
pO2, Ven: 36 mmHg (ref 32.0–45.0)

## 2021-03-20 LAB — HEPARIN LEVEL (UNFRACTIONATED): Heparin Unfractionated: 0.27 IU/mL — ABNORMAL LOW (ref 0.30–0.70)

## 2021-03-20 SURGERY — RIGHT/LEFT HEART CATH AND CORONARY ANGIOGRAPHY
Anesthesia: LOCAL

## 2021-03-20 MED ORDER — VERAPAMIL HCL 2.5 MG/ML IV SOLN
INTRAVENOUS | Status: DC | PRN
  Administered 2021-03-20: 10 mL via INTRA_ARTERIAL

## 2021-03-20 MED ORDER — ATORVASTATIN CALCIUM 80 MG PO TABS
80.0000 mg | ORAL_TABLET | Freq: Every day | ORAL | Status: DC
  Administered 2021-03-20 – 2021-04-01 (×12): 80 mg via ORAL
  Filled 2021-03-20 (×12): qty 1

## 2021-03-20 MED ORDER — VERAPAMIL HCL 2.5 MG/ML IV SOLN
INTRAVENOUS | Status: AC
  Filled 2021-03-20: qty 2

## 2021-03-20 MED ORDER — ASPIRIN 81 MG PO CHEW: 81.0000 mg | CHEWABLE_TABLET | ORAL | Status: DC

## 2021-03-20 MED ORDER — SODIUM CHLORIDE 0.9 % IV SOLN: INTRAVENOUS | Status: DC

## 2021-03-20 MED ORDER — DIAZEPAM 5 MG PO TABS: 5.0000 mg | ORAL_TABLET | Freq: Four times a day (QID) | ORAL | Status: DC | PRN

## 2021-03-20 MED ORDER — FENTANYL CITRATE (PF) 100 MCG/2ML IJ SOLN
INTRAMUSCULAR | Status: AC
  Filled 2021-03-20: qty 2

## 2021-03-20 MED ORDER — LIDOCAINE HCL (PF) 1 % IJ SOLN
INTRAMUSCULAR | Status: AC
  Filled 2021-03-20: qty 30

## 2021-03-20 MED ORDER — SODIUM CHLORIDE 0.9 % IV SOLN: 250.0000 mL | INTRAVENOUS | Status: DC | PRN

## 2021-03-20 MED ORDER — HEPARIN (PORCINE) IN NACL 1000-0.9 UT/500ML-% IV SOLN
INTRAVENOUS | Status: DC | PRN
  Administered 2021-03-20 (×2): 500 mL

## 2021-03-20 MED ORDER — SODIUM CHLORIDE 0.9 % IV SOLN: INTRAVENOUS | Status: AC

## 2021-03-20 MED ORDER — HEPARIN (PORCINE) IN NACL 1000-0.9 UT/500ML-% IV SOLN
INTRAVENOUS | Status: AC
  Filled 2021-03-20: qty 1000

## 2021-03-20 MED ORDER — SODIUM CHLORIDE 0.9% FLUSH: 3.0000 mL | Freq: Two times a day (BID) | INTRAVENOUS | Status: DC

## 2021-03-20 MED ORDER — ONDANSETRON HCL 4 MG/2ML IJ SOLN: 4.0000 mg | Freq: Four times a day (QID) | INTRAMUSCULAR | Status: DC | PRN

## 2021-03-20 MED ORDER — SODIUM CHLORIDE 0.9% FLUSH
3.0000 mL | INTRAVENOUS | Status: DC | PRN
Start: 2021-03-20 — End: 2021-03-20

## 2021-03-20 MED ORDER — ASPIRIN 81 MG PO CHEW
81.0000 mg | CHEWABLE_TABLET | ORAL | Status: AC
  Administered 2021-03-20: 81 mg via ORAL
  Filled 2021-03-20: qty 1

## 2021-03-20 MED ORDER — LIDOCAINE HCL (PF) 1 % IJ SOLN
INTRAMUSCULAR | Status: DC | PRN
  Administered 2021-03-20 (×2): 2 mL

## 2021-03-20 MED ORDER — HEPARIN SODIUM (PORCINE) 1000 UNIT/ML IJ SOLN
INTRAMUSCULAR | Status: AC
  Filled 2021-03-20: qty 1

## 2021-03-20 MED ORDER — FENTANYL CITRATE (PF) 100 MCG/2ML IJ SOLN
INTRAMUSCULAR | Status: DC | PRN
  Administered 2021-03-20: 25 ug via INTRAVENOUS

## 2021-03-20 MED ORDER — SODIUM CHLORIDE 0.9% FLUSH
3.0000 mL | Freq: Two times a day (BID) | INTRAVENOUS | Status: DC
  Administered 2021-03-21: 3 mL via INTRAVENOUS

## 2021-03-20 MED ORDER — MIDAZOLAM HCL 2 MG/2ML IJ SOLN
INTRAMUSCULAR | Status: AC
  Filled 2021-03-20: qty 2

## 2021-03-20 MED ORDER — IOHEXOL 350 MG/ML SOLN
INTRAVENOUS | Status: DC | PRN
  Administered 2021-03-20: 50 mL

## 2021-03-20 MED ORDER — PNEUMOCOCCAL VAC POLYVALENT 25 MCG/0.5ML IJ INJ
0.5000 mL | INJECTION | INTRAMUSCULAR | Status: AC
  Administered 2021-03-21: 0.5 mL via INTRAMUSCULAR
  Filled 2021-03-20: qty 0.5

## 2021-03-20 MED ORDER — HYDRALAZINE HCL 20 MG/ML IJ SOLN: 10.0000 mg | INTRAMUSCULAR | Status: AC | PRN

## 2021-03-20 MED ORDER — HEPARIN SODIUM (PORCINE) 1000 UNIT/ML IJ SOLN
INTRAMUSCULAR | Status: DC | PRN
  Administered 2021-03-20: 4500 [IU] via INTRAVENOUS

## 2021-03-20 MED ORDER — SODIUM CHLORIDE 0.9% FLUSH: 3.0000 mL | INTRAVENOUS | Status: DC | PRN

## 2021-03-20 MED ORDER — DIGOXIN 125 MCG PO TABS
0.1250 mg | ORAL_TABLET | Freq: Every day | ORAL | Status: DC
  Administered 2021-03-20 – 2021-03-21 (×2): 0.125 mg via ORAL
  Filled 2021-03-20 (×2): qty 1

## 2021-03-20 MED ORDER — HEPARIN (PORCINE) 25000 UT/250ML-% IV SOLN
1350.0000 [IU]/h | INTRAVENOUS | Status: DC
  Administered 2021-03-20: 1250 [IU]/h via INTRAVENOUS
  Administered 2021-03-21: 1350 [IU]/h via INTRAVENOUS
  Filled 2021-03-20 (×2): qty 250

## 2021-03-20 MED ORDER — SPIRONOLACTONE 12.5 MG HALF TABLET
12.5000 mg | ORAL_TABLET | Freq: Every day | ORAL | Status: DC
  Administered 2021-03-20 – 2021-03-21 (×2): 12.5 mg via ORAL
  Filled 2021-03-20 (×2): qty 1

## 2021-03-20 MED ORDER — FUROSEMIDE 10 MG/ML IJ SOLN
40.0000 mg | Freq: Once | INTRAMUSCULAR | Status: AC
  Administered 2021-03-20: 40 mg via INTRAVENOUS
  Filled 2021-03-20: qty 4

## 2021-03-20 MED ORDER — ACETAMINOPHEN 325 MG PO TABS: 650.0000 mg | ORAL_TABLET | ORAL | Status: DC | PRN

## 2021-03-20 MED ORDER — MIDAZOLAM HCL 2 MG/2ML IJ SOLN
INTRAMUSCULAR | Status: DC | PRN
  Administered 2021-03-20: 2 mg via INTRAVENOUS

## 2021-03-20 MED ORDER — ASPIRIN 81 MG PO CHEW
81.0000 mg | CHEWABLE_TABLET | Freq: Every day | ORAL | Status: DC
  Administered 2021-03-21: 81 mg via ORAL
  Filled 2021-03-20: qty 1

## 2021-03-20 MED ORDER — LABETALOL HCL 5 MG/ML IV SOLN: 10.0000 mg | INTRAVENOUS | Status: AC | PRN

## 2021-03-20 SURGICAL SUPPLY — 12 items
CATH OPTITORQUE TIG 4.0 5F (CATHETERS) ×1 IMPLANT
CATH SWAN GANZ 7F STRAIGHT (CATHETERS) ×1 IMPLANT
DEVICE RAD COMP TR BAND LRG (VASCULAR PRODUCTS) ×1 IMPLANT
GLIDESHEATH SLEND SS 6F .021 (SHEATH) ×1 IMPLANT
GLIDESHEATH SLENDER 7FR .021G (SHEATH) ×1 IMPLANT
GUIDEWIRE INQWIRE 1.5J.035X260 (WIRE) IMPLANT
INQWIRE 1.5J .035X260CM (WIRE) ×2
KIT HEART LEFT (KITS) ×2 IMPLANT
PACK CARDIAC CATHETERIZATION (CUSTOM PROCEDURE TRAY) ×2 IMPLANT
TRANSDUCER W/STOPCOCK (MISCELLANEOUS) ×2 IMPLANT
TUBING CIL FLEX 10 FLL-RA (TUBING) ×2 IMPLANT
WIRE EMERALD 3MM-J .025X260CM (WIRE) ×1 IMPLANT

## 2021-03-20 NOTE — Interval H&P Note (Signed)
Cath Lab Visit (complete for each Cath Lab visit)  Clinical Evaluation Leading to the Procedure:   ACS: No.  Non-ACS:    Anginal Classification: CCS III  Anti-ischemic medical therapy: No Therapy  Non-Invasive Test Results: High-risk stress test findings: cardiac mortality >3%/year  Prior CABG: No previous CABG      History and Physical Interval Note:  03/20/2021 7:45 AM  Edward [name]  has presented today for surgery, with the diagnosis of coronary artery disease.  The various methods of treatment have been discussed with the patient and family. After consideration of risks, benefits and other options for treatment, the patient has consented to  Procedure(s): RIGHT/LEFT HEART CATH AND CORONARY ANGIOGRAPHY (N/A) as a surgical intervention.  The patient's history has been reviewed, patient examined, no change in status, stable for surgery.  I have reviewed the patient's chart and labs.  Questions were answered to the patient's satisfaction.     Nicki Guadalajara

## 2021-03-20 NOTE — Progress Notes (Signed)
Pre cabg  has been completed.   Preliminary results in CV Proc.   Blanch Media 03/20/2021 2:29 PM

## 2021-03-20 NOTE — Progress Notes (Addendum)
ANTICOAGULATION CONSULT NOTE - Initial Consult  Pharmacy Consult for heparin Indication: chest pain/ACS  No Known Allergies  Patient Measurements: Height: 5\' 9"  (175.3 cm) Weight: 93 kg (205 lb) IBW/kg (Calculated) : 70.7 Heparin Dosing Weight: 89kg  Vital Signs: Temp: 98 F (36.7 C) (04/11 0933) Temp Source: Oral (04/11 0933) BP: 109/79 (04/11 0933) Pulse Rate: 85 (04/11 0933)  Labs: No results for input(s): HGB, HCT, PLT, APTT, LABPROT, INR, HEPARINUNFRC, HEPRLOWMOCWT, CREATININE, CKTOTAL, CKMB, TROPONINIHS in the last 72 hours.  Estimated Creatinine Clearance: 51.5 mL/min (A) (by C-G formula based on SCr of 1.61 mg/dL (H)).   Medical History: Past Medical History:  Diagnosis Date  . CAD (coronary artery disease)   . HCV antibody positive    previous eval at [facility] hepatology clinic  . STEMI (ST elevation myocardial infarction) (HCC) 2004   with totally occluded CFX (infarct related artery) and 80% mRCA stenosis. Taxus DES to both vessles. EF was 50-55% by LV-gram    Medications:  Medications Prior to Admission  Medication Sig Dispense Refill Last Dose  . aspirin EC 81 MG tablet Take 81 mg by mouth daily. Swallow whole.   03/19/2021 at 0600  . furosemide (LASIX) 20 MG tablet Take one tablet by mouth daily for three days then take as needed for shortness of breath. (Patient taking differently: Take 20 mg by mouth daily as needed (for shortness of breath.).) 30 tablet 11 03/19/2021 at 0600  . loratadine (CLARITIN) 10 MG tablet Take 10 mg by mouth daily as needed for allergies.   03/19/2021 at 0600  . Multiple Vitamin (MULTIVITAMIN WITH MINERALS) TABS tablet Take 1 tablet by mouth daily.   Past Week at Unknown time   Scheduled:  . [START ON 03/21/2021] aspirin  81 mg Oral Daily  . atorvastatin  80 mg Oral Daily  . [START ON 03/21/2021] pneumococcal 23 valent vaccine  0.5 mL Intramuscular Tomorrow-1000  . sodium chloride flush  3 mL Intravenous Q12H    Assessment: 65 yo male  s/p cath with multivessel CAD for CABG consult. Pharmacy consulted to dose heparin (sheath removed ~ 8:30am)   Goal of Therapy:  Heparin level 0.3-0.7 units/ml Monitor platelets by anticoagulation protocol: Yes   Plan:   -Start heparin 1250 units/hr at 4:30pm -Heparin level in 6 hours and daily wth CBC daily  76, PharmD Clinical Pharmacist **Pharmacist phone directory can now be found on [email or web address] ([email or web address]).  Listed under [facility].

## 2021-03-20 NOTE — Progress Notes (Signed)
ANTICOAGULATION CONSULT NOTE   Pharmacy Consult for Heparin Indication: chest pain/ACS  No Known Allergies  Patient Measurements: Height: 5\' 9"  (175.3 cm) Weight: 93 kg (205 lb) IBW/kg (Calculated) : 70.7 Heparin Dosing Weight: 89kg  Vital Signs: Temp: 99.1 F (37.3 C) (04/11 2028) Temp Source: Oral (04/11 2028) BP: 119/86 (04/11 2028) Pulse Rate: 101 (04/11 2028)  Labs: Recent Labs    03/20/21 0815 03/20/21 0820 03/20/21 2228  HGB 16.0  16.0 16.0  --   HCT 47.0  47.0 47.0  --   HEPARINUNFRC  --   --  0.27*    Estimated Creatinine Clearance: 51.5 mL/min (A) (by C-G formula based on SCr of 1.61 mg/dL (H)).   Medical History: Past Medical History:  Diagnosis Date  . CAD (coronary artery disease)   . HCV antibody positive    previous eval at [facility] hepatology clinic  . STEMI (ST elevation myocardial infarction) (HCC) 2004   with totally occluded CFX (infarct related artery) and 80% mRCA stenosis. Taxus DES to both vessles. EF was 50-55% by LV-gram    Medications:  Medications Prior to Admission  Medication Sig Dispense Refill Last Dose  . aspirin EC 81 MG tablet Take 81 mg by mouth daily. Swallow whole.   03/19/2021 at 0600  . furosemide (LASIX) 20 MG tablet Take one tablet by mouth daily for three days then take as needed for shortness of breath. (Patient taking differently: Take 20 mg by mouth daily as needed (for shortness of breath.).) 30 tablet 11 03/19/2021 at 0600  . loratadine (CLARITIN) 10 MG tablet Take 10 mg by mouth daily as needed for allergies.   03/19/2021 at 0600  . Multiple Vitamin (MULTIVITAMIN WITH MINERALS) TABS tablet Take 1 tablet by mouth daily.   Past Week at Unknown time   Scheduled:  . [START ON 03/21/2021] aspirin  81 mg Oral Daily  . atorvastatin  80 mg Oral Daily  . digoxin  0.125 mg Oral Daily  . [START ON 03/21/2021] pneumococcal 23 valent vaccine  0.5 mL Intramuscular Tomorrow-1000  . sodium chloride flush  3 mL Intravenous Q12H  .  spironolactone  12.5 mg Oral Daily    Assessment: 66 yo male s/p cath with multivessel CAD for CABG consult. Pharmacy consulted to dose heparin (sheath removed ~ 8:30am)  4/11 PM update:  Heparin level just below goal No issues per RN  Goal of Therapy:  Heparin level 0.3-0.7 units/ml Monitor platelets by anticoagulation protocol: Yes   Plan:   Inc heparin to 1350 units/hr Re-check heparin level in 6-8 hours  6/11, PharmD, BCPS Clinical Pharmacist Phone: [phone]

## 2021-03-20 NOTE — Consult Note (Addendum)
Advanced Heart Failure Team Consult Note   Primary Physician: Joaquim Nam, MD PCP-Cardiologist:  Donato Schultz, MD  Reason for Consultation: Heart Failure   HPI:    Edward [name] is seen today for evaluation of heart failure  at the request of Dr Cliffton Asters.   [name] is a 65 year old with a history of  CAD, MI 2004 DES x2 totally occluded CFX (infarct-related artery) and 80% mRCA stenosis with taxus DES to both vessels, hyperlipidemia. I seen an ECHO 2011 with EF 40-45%. He does not drink alcohol. Retired from [employer].   He has been active and taking spin classes 6 days week up until 2 months ago. Says he completed the spin class and went home and had a hard time taking bath because he was short of breath. He was seen at Urgent Care and given an inhaler. He was no real improvement and had ongoing SOB and orthopnea. He was seen at [facility] and set up for stress test/ECHO and started on po lasix. He saw Dr Anne Fu in the office 03/16/21 to discuss abnormal ECHO and stress test. ECHO on 03/14/21 showed EF 20-25% and normal RV. Set up for cath.   Today he presented for scheduled cath. Cath showed multivessel, pwp 30, and cardiac index 1.7. CT surgery consulted. Considering CABG but needs viability study. No chest pain.   Cardiac Test LHC 03/20/21  RA 9, PA 62/29 (41), PWP 31, PA Sat 64%, Fick CO 3.6/CI 1.7   Ost LAD lesion is 30% stenosed.  Ost Cx to Prox Cx lesion is 60% stenosed.  1st Mrg lesion is 100% stenosed.  Prox Cx to Mid Cx lesion is 95% stenosed.  Mid LAD-1 lesion is 75% stenosed.  Mid LAD-2 lesion is 95% stenosed.  Dist LAD lesion is 65% stenosed.  Prox RCA to Mid RCA lesion is 100% stenosed.  ECHO 03/14/21  F 20-25% RV normal  Stress Test 02/27/21  Nuclear stress EF: 15%.  The left ventricular ejection fraction is severely decreased (<30%).  Blood pressure demonstrated a hypertensive response to exercise.  There was no ST segment deviation noted during  stress.  This is a high risk study. 1. EF 15% with diffuse hypokinesis.  2. Fixed moderate basal to mid inferior and inferolateral perfusion defect, possible prior infarction. No ischemia.  High risk study due to markedly low EF.   Review of Systems: [y] = yes, [ ]  = no   . General: Weight gain [ ] ; Weight loss [ ] ; Anorexia [ ] ; Fatigue [Y ]; Fever [ ] ; Chills [ ] ; Weakness [ ]   . Cardiac: Chest pain/pressure [ ] ; Resting SOB [ ] ; Exertional SOB [Y ]; Orthopnea [ Y]; Pedal Edema [ ] ; Palpitations [ ] ; Syncope [ ] ; Presyncope [ ] ; Paroxysmal nocturnal dyspnea[ ]   . Pulmonary: Cough [ ] ; Wheezing[ ] ; Hemoptysis[ ] ; Sputum [ ] ; Snoring [ ]   . GI: Vomiting[ ] ; Dysphagia[ ] ; Melena[ ] ; Hematochezia [ ] ; Heartburn[ ] ; Abdominal pain [ ] ; Constipation [ ] ; Diarrhea [ ] ; BRBPR [ ]   . GU: Hematuria[ ] ; Dysuria [ ] ; Nocturia[ ]   . Vascular: Pain in legs with walking [ ] ; Pain in feet with lying flat [ ] ; Non-healing sores [ ] ; Stroke [ ] ; TIA [ ] ; Slurred speech [ ] ;  . Neuro: Headaches[ ] ; Vertigo[ ] ; Seizures[ ] ; Paresthesias[ ] ;Blurred vision [ ] ; Diplopia [ ] ; Vision changes [ ]   . Ortho/Skin: Arthritis [ ] ; Joint pain [ ] ; Muscle  pain [ ] ; Joint swelling [ ] ; Back Pain [ ] ; Rash [ ]   . Psych: Depression[ ] ; Anxiety[ ]   . Heme: Bleeding problems [ ] ; Clotting disorders [ ] ; Anemia [ ]   . Endocrine: Diabetes [ ] ; Thyroid dysfunction[ ]   Home Medications Prior to Admission medications   Medication Sig Start Date End Date Taking? Authorizing Provider  aspirin EC 81 MG tablet Take 81 mg by mouth daily. Swallow whole.   Yes [provider]  furosemide (LASIX) 20 MG tablet Take one tablet by mouth daily for three days then take as needed for shortness of breath. Patient taking differently: Take 20 mg by mouth daily as needed (for shortness of breath.). 02/20/21  Yes Bhagat, Bhavinkumar, PA  loratadine (CLARITIN) 10 MG tablet Take 10 mg by mouth daily as needed for allergies.   Yes  [provider]  Multiple Vitamin (MULTIVITAMIN WITH MINERALS) TABS tablet Take 1 tablet by mouth daily.   Yes [provider]    Past Medical History: Past Medical History:  Diagnosis Date  . CAD (coronary artery disease)   . HCV antibody positive    previous eval at [facility] hepatology clinic  . STEMI (ST elevation myocardial infarction) (HCC) 2004   with totally occluded CFX (infarct related artery) and 80% mRCA stenosis. Taxus DES to both vessles. EF was 50-55% by LV-gram    Past Surgical History: Past Surgical History:  Procedure Laterality Date  . CORONARY ANGIOPLASTY WITH STENT PLACEMENT  08/22/03  . RIGHT/LEFT HEART CATH AND CORONARY ANGIOGRAPHY N/A 03/20/2021   Procedure: RIGHT/LEFT HEART CATH AND CORONARY ANGIOGRAPHY;  Surgeon: , MD;  Location: [facility] INVASIVE CV LAB;  Service: Cardiovascular;  Laterality: N/A;    Family History: Family History  Problem Relation Age of Onset  . Aneurysm Father        brain  . Stroke Father   . Other Mother        ASCVD  . Hypertension Mother   . Heart attack Other        uncle  . Diabetes Child   . Diabetes Child   . Colon cancer Neg Hx   . Prostate cancer Neg Hx     Social History: Social History   Socioeconomic History  . Marital status: Married    Spouse name: Jalal Rauch  . Number of children: 3  . Years of education: 5  . Highest education level: Not on file  Occupational History  . Occupation: Retired  Tobacco Use  . Smoking status: Former Smoker    Quit date: 12/11/2003    Years since quitting: 17.2  . Smokeless tobacco: Never Used  Vaping Use  . Vaping Use: Never used  Substance and Sexual Activity  . Alcohol use: No  . Drug use: No  . Sexual activity: Not on file  Other Topics Concern  . 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   Likes bass fishing    Caffeine: 2cups coffee or less per day   Social Determinants of 10/22/03 Strain: Not on file  Food Insecurity: Not on file  Transportation Needs: Not on file  Physical Activity: Not on file  Stress: Not on file  Social Connections: Not on file    Allergies:  No Known Allergies  Objective:  Vital Signs:   Temp:  [96.2 F (35.7 C)-98 F (36.7 C)] 96.2 F (35.7 C) (04/11 1123) Pulse Rate:  [84-96] 95 (04/11 1123) Resp:  [14-27] 18 (04/11 1123) BP: (109-138)/(78-94) 138/90 (04/11 1123) SpO2:  [95 %-100 %] 99 % (04/11 1123) Weight:  [93 kg] 93 kg (04/11 0532) Last BM Date: 03/19/21 (per pt)  Weight change: Filed Weights   03/20/21 0532  Weight: 93 kg    Intake/Output:  No intake or output data in the 24 hours ending 03/20/21 1504    Physical Exam    General: No resp difficulty HEENT: normal Neck: supple. JVP 8-9 . Carotids 2+ bilat; no bruits. No lymphadenopathy or thyromegaly appreciated. Cor: PMI nondisplaced. Regular rate & rhythm. No rubs, gallops or murmurs. Lungs: clear Abdomen: soft, nontender, nondistended. No hepatosplenomegaly. No bruits or masses. Good bowel sounds. Extremities: no cyanosis, clubbing, rash, edema Neuro: alert & orientedx3, cranial nerves grossly intact. moves all 4 extremities w/o difficulty. Affect pleasant   Telemetry   Sinus Tach 100s   EKG    SR with QRS 140 ms  Labs   Basic Metabolic Panel: Recent Labs  Lab 03/14/21 0731 03/16/21 1557  NA 144 149*  K 4.9 4.8  CL 108* 106  CO2 22 19*  GLUCOSE 127* 89  BUN 22 24  CREATININE 1.73* 1.61*  CALCIUM 9.3 9.9    Liver Function Tests: No results for input(s): AST, ALT, ALKPHOS, BILITOT, PROT, ALBUMIN in the last 168 hours. No results for input(s): LIPASE, AMYLASE in the last 168 hours. No results for input(s): AMMONIA in the last 168 hours.  CBC: Recent Labs  Lab 03/16/21 1557  WBC 5.5  HGB 17.1  HCT 51.2*  MCV 88  PLT 141*    Cardiac  Enzymes: No results for input(s): CKTOTAL, CKMB, CKMBINDEX, TROPONINI in the last 168 hours.  BNP: BNP (last 3 results) Recent Labs    02/13/21 1356  BNP 998.0*    ProBNP (last 3 results) Recent Labs    03/14/21 0731  PROBNP 4,235*     CBG: No results for input(s): GLUCAP in the last 168 hours.  Coagulation Studies: No results for input(s): LABPROT, INR in the last 72 hours.   Imaging   CARDIAC CATHETERIZATION  Result Date: 03/20/2021  Ost LAD lesion is 30% stenosed.  Ost Cx to Prox Cx lesion is 60% stenosed.  1st Mrg lesion is 100% stenosed.  Prox Cx to Mid Cx lesion is 95% stenosed.  Mid LAD-1 lesion is 75% stenosed.  Mid LAD-2 lesion is 95% stenosed.  Dist LAD lesion is 65% stenosed.  Prox RCA to Mid RCA lesion is 100% stenosed.  Moderate elevation of right heart pressures with moderate pulmonary hypertension with mean PA pressure at 41 mmHg. Severe multivessel CAD with high-grade subtotal stenoses of the LAD, left circumflex, and total occlusion of the previously placed stents in the circumflex marginal and RCA.  There are extensive collaterals supplying the distal RCA, and collaterals supplying the obtuse marginal vessel.  Collaterals are jeopardized due to the high-grade subtotal stenoses of the LAD and circumflex vessel.  There are excellent distal targets. Findings are compatible with a severe ischemic cardiomyopathy; suspect a significant a amount of hibernating myocardium. RECOMMENDATION: Patient will be heparinized later today following TR band removal.  Surgical consultation for CABG revascularization.   VAS US DOPPLER PRE CABG  Result Date: 03/20/2021 PREOPERATIVE VASCULAR EVALUATION  Indications:      Pre-CABG. Risk Factors:     Coronary  artery disease. Comparison Study: NO PRIOR Performing Technologist: Blanch MediaMegan Riddle RVS  Examination Guidelines: A complete evaluation includes B-mode imaging, spectral Doppler, color Doppler, and power Doppler as needed of all  accessible portions of each vessel. Bilateral testing is considered an integral part of a complete examination. Limited examinations for reoccurring indications may be performed as noted.  Right Carotid Findings: +----------+--------+--------+--------+------------+--------+           PSV cm/sEDV cm/sStenosisDescribe    Comments +----------+--------+--------+--------+------------+--------+ CCA Prox  66      22              heterogenous         +----------+--------+--------+--------+------------+--------+ CCA Distal51      16              heterogenous         +----------+--------+--------+--------+------------+--------+ ICA Prox  70      27      1-39%   heterogenous         +----------+--------+--------+--------+------------+--------+ ICA Distal75      28                                   +----------+--------+--------+--------+------------+--------+ ECA       67      11                                   +----------+--------+--------+--------+------------+--------+ Portions of this table do not appear on this page. +----------+--------+-------+--------+------------+           PSV cm/sEDV cmsDescribeArm Pressure +----------+--------+-------+--------+------------+ Subclavian112                                 +----------+--------+-------+--------+------------+ +---------+--------+--+--------+--+---------+ VertebralPSV cm/s36EDV cm/s11Antegrade +---------+--------+--+--------+--+---------+ Left Carotid Findings: +----------+--------+--------+--------+------------+--------+           PSV cm/sEDV cm/sStenosisDescribe    Comments +----------+--------+--------+--------+------------+--------+ CCA Prox  107     32              heterogenous         +----------+--------+--------+--------+------------+--------+ CCA Distal43      11              heterogenous         +----------+--------+--------+--------+------------+--------+ ICA Prox  53      23       1-39%   heterogenous         +----------+--------+--------+--------+------------+--------+ ICA Distal88      35                                   +----------+--------+--------+--------+------------+--------+ ECA       78                                           +----------+--------+--------+--------+------------+--------+ +----------+--------+--------+--------+------------+ SubclavianPSV cm/sEDV cm/sDescribeArm Pressure +----------+--------+--------+--------+------------+           77                                   +----------+--------+--------+--------+------------+ +---------+--------+--+--------+--+---------+ VertebralPSV cm/s35EDV cm/s11Antegrade +---------+--------+--+--------+--+---------+  ABI Findings: +--------+------------------+-----+---------+--------+ Right  Rt Pressure (mmHg)IndexWaveform Comment  +--------+------------------+-----+---------+--------+ [id number]                    triphasic         +--------+------------------+-----+---------+--------+ PTA     141               0.97 triphasic         +--------+------------------+-----+---------+--------+ DP      135               0.93 triphasic         +--------+------------------+-----+---------+--------+ +--------+------------------+-----+---------+-------+ Left    Lt Pressure (mmHg)IndexWaveform Comment +--------+------------------+-----+---------+-------+ [id number]                    triphasic        +--------+------------------+-----+---------+-------+ PTA     155               1.07 triphasic        +--------+------------------+-----+---------+-------+ DP      150               1.03 triphasic        +--------+------------------+-----+---------+-------+ +-------+---------------+----------------+ ABI/TBIToday's ABI/TBIPrevious ABI/TBI +-------+---------------+----------------+ Right  0.97                             +-------+---------------+----------------+ Left   1.07                            +-------+---------------+----------------+  Right Doppler Findings: +--------+--------+-----+---------+--------+ Site    PressureIndexDoppler  Comments +--------+--------+-----+---------+--------+ [id number]          triphasic         +--------+--------+-----+---------+--------+ Radial               triphasic         +--------+--------+-----+---------+--------+ Ulnar                triphasic         +--------+--------+-----+---------+--------+  Left Doppler Findings: +--------+--------+-----+---------+--------+ Site    PressureIndexDoppler  Comments +--------+--------+-----+---------+--------+ [id number]          triphasic         +--------+--------+-----+---------+--------+ Radial               triphasic         +--------+--------+-----+---------+--------+ Ulnar                triphasic         +--------+--------+-----+---------+--------+  Summary: Right Carotid: Velocities in the right ICA are consistent with a 1-39% stenosis. Left Carotid: Velocities in the left ICA are consistent with a 1-39% stenosis. Vertebrals: Bilateral vertebral arteries demonstrate antegrade flow. Right ABI: Resting right ankle-brachial index is within normal range. No evidence of significant right lower extremity arterial disease. Left ABI: Resting left ankle-brachial index is within normal range. No evidence of significant left lower extremity arterial disease. Right Upper Extremity: Doppler waveforms remain within normal limits with right radial compression. Doppler waveforms remain within normal limits with right ulnar compression. Left Upper Extremity: Doppler waveforms remain within normal limits with left radial compression. Doppler waveforms decrease 50% with left ulnar compression.     Preliminary       Medications:     Current Medications: . [START ON 03/21/2021] aspirin  81 mg Oral Daily   . atorvastatin  80 mg Oral Daily  . [START ON 03/21/2021] pneumococcal 23 valent vaccine  0.5 mL Intramuscular Tomorrow-1000  . sodium chloride flush  3 mL Intravenous Q12H     Infusions: . sodium chloride    . heparin          Assessment/Plan   1. CAD - Had MI in 2004 with DESx2 with totally occluded CFX (infarct-related artery) and 80% mRCA stenosis.  Patient had Taxus DES to both vessels.   -  Cath today by Dr Tresa Endo -->High-grade subtotal stenoses of the LAD, left circumflex, and total occlusion of the previously placed stents in the circumflex marginal and RCA.  There are extensive collaterals supplying the distal RCA, and collaterals supplying the obtuse marginal vessel.  Collaterals are jeopardized due to the high-grade subtotal stenoses of the LAD and circumflex vessel.  There are excellent distal targets. - CT surgery consulted  - CMRI for viability  2. A/C Systolic Heart Failure 03/14/21 EF 20-25% with normal RV  -Cath today with elevated wedge 31 , CI 1.7.  - Give 40 mg IV lasix now. Looks like K runs on higher end so no K supplement.  - Follow BMET  - No bb for now.  - No spiro/arni. Will check labs in am.   3. Hyperlipidemia  On high intensity statin.   4. CKD Stage IIIa Creatinine baseline 1.4-1.6  Follow BMET daily.      Length of Stay: 0  Tonye Becket, NP  03/20/2021, 3:04 PM  Advanced Heart Failure Team Pager [phone] (M-F; 7a - 5p)  Please contact [facility] for night-coverage after hours (4p -7a ) and weekends on [email or web address]  Patient seen and examined with the above-signed Advanced Practice Provider and/or Housestaff. I personally reviewed laboratory data, imaging studies and relevant notes. I independently examined the patient and formulated the important aspects of the plan. I have edited the note to reflect any of my changes or salient points. I have personally discussed the plan with the patient and/or family.  65 y/o male (former patient of Dr.  Alford Highland) with known CAD admitted with several week h/o of exertional fatigue and HF symptoms. Found to have ischemic CM with very severe 3v CAD. We are asked by Dr. Cliffton Asters to help risk stratify for CABG and optimize prior to surgery.   Has been very active up until a few weeks ago though non-complaint with medical f/u.   Echo with EF 15-20% and at least mild to moderate RV dysfunction. (Personally reviewed).   RHC: RA 9, PA 62/29 (41), PWP 31, PA Sat 64%, Fick CO 3.6/CI 1.7  PAPI 4.5 PA sat 64%  General:  Well appearing. No resp difficulty HEENT: normal Neck: supple.JVP to jaw  Carotids 2+ bilat; no bruits. No lymphadenopathy or thryomegaly appreciated. Cor: PMI nondisplaced. Regular mildly tachy  No rubs, gallops or murmurs. Lungs: clear Abdomen: soft, nontender, nondistended. No hepatosplenomegaly. No bruits or masses. Good bowel sounds. Extremities: no cyanosis, clubbing, rash, tr-1+ edema Neuro: alert & orientedx3, cranial nerves grossly intact. moves all 4 extremities w/o difficulty. Affect pleasant  Based on cath films, I suspect myocardium is mostly viable with severe hibernating myocardium and he would benefit from CABG but agree with cMRI to evaluate for viability.   Continue IV diuresis. Start dig, spiro tonight. IF BP and renal function stable in am can add ARB or ARNI. We will follow closely.   Arvilla Meres, MD  9:58 PM

## 2021-03-20 NOTE — Consult Note (Addendum)
[address]       [place] [address]             [phone]        Cristofher Livecchi [facility] Medical Record #[id number] Date of Birth: February 23, 1956  Referring: No ref. provider found Primary Care: Joaquim Nam, MD Primary Cardiologist:Mark Anne Fu, MD  Chief Complaint:   Shortness of breath with exertion   History of Present Illness:      Mr. Donaho is a 65 year old male patient with a past medical history significant for a myocardial infarction back in 2004 with 2 drug-eluting stents placed one in the circumflex and one in the mid RCA, and hyperlipidemia.  He is a previous smoker and has since stopped back in 2005.  He originally presented with 2 weeks of worsening shortness of breath while riding his bicycle.  He also reports orthopnea.  He did not have traditional chest tightness or pressure.  He then had an echocardiogram back on March 14 2021 which showed an estimated left ejection fraction of 20 to 25%.  He also had a nuclear stress test in which his estimated ejection fraction was 15% with diffuse hypokinesis.  Due to his past history of coronary artery disease and his low ejection fraction a cardiac catheterization was performed.  This showed severe multivessel CAD with high-grade subtotal stenosis of the LAD, left circumflex, and total occlusion of the previously placed stents in the circumflex and RCA.  There are extensive collaterals supplying the distal RCA and collaterals supplying the obtuse marginal vessel.  The collaterals are jeopardized due to the high-grade subtotal stenosis of the LAD and circumflex vessel.  The patient has been referred to [place] for possible surgical revascularization.  The patient lives locally with his wife and leads an active life style. Does not take any prescriptive medications. He was on a statin after his first myocardial infarction but stopped due to intolerance.     Current Activity/ Functional Status: Patient was independent  with mobility/ambulation, transfers, ADL's, IADL's.   Zubrod Score: At the time of surgery this patient's most appropriate activity status/level should be described as: []     0    Normal activity, no symptoms [x]     1    Restricted in physical strenuous activity but ambulatory, able to do out light work []     2    Ambulatory and capable of self care, unable to do work activities, up and about                 more than 50%  Of the time                            []     3    Only limited self care, in bed greater than 50% of waking hours []     4    Completely disabled, no self care, confined to bed or chair []     5    Moribund  Past Medical History:  Diagnosis Date  . CAD (coronary artery disease)   . HCV antibody positive    previous eval at [facility] hepatology clinic  . STEMI (ST elevation myocardial infarction) (HCC) 2004   with totally occluded CFX (infarct related artery) and 80% mRCA stenosis. Taxus DES to both vessles. EF was 50-55% by LV-gram    Past Surgical History:  Procedure Laterality Date  . CORONARY ANGIOPLASTY WITH STENT PLACEMENT  08/22/03  . RIGHT/LEFT HEART CATH AND CORONARY ANGIOGRAPHY N/A 03/20/2021   Procedure: RIGHT/LEFT HEART CATH AND CORONARY ANGIOGRAPHY;  Surgeon: Lennette Bihari, MD;  Location: [facility] INVASIVE CV LAB;  Service: Cardiovascular;  Laterality: N/A;    Social History   Tobacco Use  Smoking Status Former Smoker  . Quit date: 12/11/2003  . Years since quitting: 17.2  Smokeless Tobacco Never Used    Social History   Substance and Sexual Activity  Alcohol Use No     No Known Allergies  Current Facility-Administered Medications  Medication Dose Route Frequency Provider Last Rate Last Admin  . 0.9 %  sodium chloride infusion   Intravenous Continuous Lennette Bihari, MD 50 mL/hr at 03/20/21 0953 Rate Change at 03/20/21 0953  . 0.9 %  sodium chloride infusion  250 mL Intravenous PRN Lennette Bihari, MD      . acetaminophen (TYLENOL) tablet 650 mg  650 mg  Oral Q4H PRN Lennette Bihari, MD      . Melene Muller ON 03/21/2021] aspirin chewable tablet 81 mg  81 mg Oral Daily Lennette Bihari, MD      . atorvastatin (LIPITOR) tablet 80 mg  80 mg Oral Daily Lennette Bihari, MD   80 mg at 03/20/21 0952  . diazepam (VALIUM) tablet 5 mg  5 mg Oral Q6H PRN Lennette Bihari, MD      . heparin ADULT infusion 100 units/mL (25000 units/236mL)  1,250 Units/hr Intravenous Continuous Silvana Newness, RPH      . hydrALAZINE (APRESOLINE) injection 10 mg  10 mg Intravenous Q20 Min PRN Lennette Bihari, MD      . labetalol (NORMODYNE) injection 10 mg  10 mg Intravenous Q10 min PRN Lennette Bihari, MD      . ondansetron [facility]) injection 4 mg  4 mg Intravenous Q6H PRN Lennette Bihari, MD      . Melene Muller ON 03/21/2021] pneumococcal 23 valent vaccine (PNEUMOVAX-23) injection 0.5 mL  0.5 mL Intramuscular Tomorrow-1000 Nicki Guadalajara A, MD      . sodium chloride flush (NS) 0.9 % injection 3 mL  3 mL Intravenous Q12H Nicki Guadalajara A, MD      . sodium chloride flush (NS) 0.9 % injection 3 mL  3 mL Intravenous PRN Lennette Bihari, MD        Medications Prior to Admission  Medication Sig Dispense Refill Last Dose  . aspirin EC 81 MG tablet Take 81 mg by mouth daily. Swallow whole.   03/19/2021 at 0600  . furosemide (LASIX) 20 MG tablet Take one tablet by mouth daily for three days then take as needed for shortness of breath. (Patient taking differently: Take 20 mg by mouth daily as needed (for shortness of breath.).) 30 tablet 11 03/19/2021 at 0600  . loratadine (CLARITIN) 10 MG tablet Take 10 mg by mouth daily as needed for allergies.   03/19/2021 at 0600  . Multiple Vitamin (MULTIVITAMIN WITH MINERALS) TABS tablet Take 1 tablet by mouth daily.   Past Week at Unknown time    Family History  Problem Relation Age of Onset  . Aneurysm Father        brain  . Stroke Father   . Other Mother        ASCVD  . Hypertension Mother   . Heart attack Other        uncle  . Diabetes Child   .  Diabetes Child   . Colon cancer Neg Hx   .  Prostate cancer Neg Hx      Review of Systems:   Review of Systems  Constitutional: Negative for chills, fever and malaise/fatigue.  Respiratory: Positive for shortness of breath.   Cardiovascular: Positive for leg swelling. Negative for chest pain.  Neurological: Negative.    Pertinent items are noted in HPI.   Physical Exam: BP 109/79 (BP Location: Left Arm)   Pulse 85   Temp 98 F (36.7 C) (Oral)   Resp 18   Ht 5\' 9"  (1.753 m)   Wt 93 kg   SpO2 97%   BMI 30.27 kg/m    General appearance: alert, cooperative and no distress Resp: clear to auscultation bilaterally Cardio: regular rate and rhythm, S1, S2 normal, no murmur, click, rub or gallop GI: soft, non-tender; bowel sounds normal; no masses,  no organomegaly Extremities: extremities normal, atraumatic, no cyanosis or edema Neurologic: Grossly normal  Diagnostic Studies & Laboratory data:     Recent Radiology Findings:   CARDIAC CATHETERIZATION  Result Date: 03/20/2021  Ost LAD lesion is 30% stenosed.  Ost Cx to Prox Cx lesion is 60% stenosed.  1st Mrg lesion is 100% stenosed.  Prox Cx to Mid Cx lesion is 95% stenosed.  Mid LAD-1 lesion is 75% stenosed.  Mid LAD-2 lesion is 95% stenosed.  Dist LAD lesion is 65% stenosed.  Prox RCA to Mid RCA lesion is 100% stenosed.  Moderate elevation of right heart pressures with moderate pulmonary hypertension with mean PA pressure at 41 mmHg. Severe multivessel CAD with high-grade subtotal stenoses of the LAD, left circumflex, and total occlusion of the previously placed stents in the circumflex marginal and RCA.  There are extensive collaterals supplying the distal RCA, and collaterals supplying the obtuse marginal vessel.  Collaterals are jeopardized due to the high-grade subtotal stenoses of the LAD and circumflex vessel.  There are excellent distal targets. Findings are compatible with a severe ischemic cardiomyopathy; suspect a  significant a amount of hibernating myocardium. RECOMMENDATION: Patient will be heparinized later today following TR band removal.  Surgical consultation for CABG revascularization.     I have independently reviewed the above radiologic studies and discussed with the patient   Recent Lab Findings: Lab Results  Component Value Date   WBC 5.5 03/16/2021   HGB 17.1 03/16/2021   HCT 51.2 (H) 03/16/2021   PLT 141 (L) 03/16/2021   GLUCOSE 89 03/16/2021   CHOL 163 09/26/2020   TRIG 70.0 09/26/2020   HDL 33.60 (L) 09/26/2020   LDLCALC 116 (H) 09/26/2020   ALT 58 (H) 02/13/2021   AST 27 02/13/2021   NA 149 (H) 03/16/2021   K 4.8 03/16/2021   CL 106 03/16/2021   CREATININE 1.61 (H) 03/16/2021   BUN 24 03/16/2021   CO2 19 (L) 03/16/2021   TSH 2.133 02/13/2021   HGBA1C 6.2 09/26/2020      Assessment / Plan:      1. Multivessel CAD with in-stent stenosis-cardiac cath listed above. Continue asa/statin/PRN BP medications 2. Hyperlipidemia-not on statin therapy due to intolerance, would refer to the outpatient lipid clinic 3. Ischemic Cardiomyopathy- EF 20-25%. Hopefully will improve with revascularization 4. Most recent A1C is 6.2, documented "hyperlipidemia" but no DM diagnosis 5. Remote smoking history, cessation back in 2005  Plan: Discussed coronary artery bypass surgery with the patient and spouse at the bedside. All questions were answered to the patient's satisfaction. Plan for CABG to take place Wed with Dr. Cliffton Asters. He will review the films and speak with the patient  and family later today.   I  spent 20 minutes counseling the patient face to face.   Jari Favre, PA-C 03/20/2021 10:44 AM   This is a 65 year old gentleman that presents with a severe three-vessel coronary artery disease.  On review of his echocardiogram he also has an ejection fraction of 20 to 25% with moderate mitral valve regurgitation.  On further discussion the patient mostly has heart failure symptoms,  and denies any anginal symptoms.  Due to concern for severe heart failure have discussed his case with our cardiology specialists and likely will obtain viability study prior to scheduling this patient for surgery.  Once this is been done we will discuss surgical planning.  Inessa Wardrop Keane Scrape

## 2021-03-21 ENCOUNTER — Inpatient Hospital Stay (HOSPITAL_COMMUNITY): Payer: Medicare Other

## 2021-03-21 DIAGNOSIS — I5021 Acute systolic (congestive) heart failure: Secondary | ICD-10-CM

## 2021-03-21 LAB — CBC
HCT: 50.4 % (ref 39.0–52.0)
Hemoglobin: 17.1 g/dL — ABNORMAL HIGH (ref 13.0–17.0)
MCH: 29.8 pg (ref 26.0–34.0)
MCHC: 33.9 g/dL (ref 30.0–36.0)
MCV: 87.8 fL (ref 80.0–100.0)
Platelets: 117 10*3/uL — ABNORMAL LOW (ref 150–400)
RBC: 5.74 MIL/uL (ref 4.22–5.81)
RDW: 12.7 % (ref 11.5–15.5)
WBC: 6.5 10*3/uL (ref 4.0–10.5)
nRBC: 0 % (ref 0.0–0.2)

## 2021-03-21 LAB — PROTIME-INR
INR: 1.1 (ref 0.8–1.2)
Prothrombin Time: 14.2 seconds (ref 11.4–15.2)

## 2021-03-21 LAB — BASIC METABOLIC PANEL
Anion gap: 9 (ref 5–15)
BUN: 19 mg/dL (ref 8–23)
CO2: 27 mmol/L (ref 22–32)
Calcium: 9.3 mg/dL (ref 8.9–10.3)
Chloride: 105 mmol/L (ref 98–111)
Creatinine, Ser: 1.58 mg/dL — ABNORMAL HIGH (ref 0.61–1.24)
GFR, Estimated: 48 mL/min — ABNORMAL LOW (ref >60)
Glucose, Bld: 127 mg/dL — ABNORMAL HIGH (ref 70–99)
Potassium: 4 mmol/L (ref 3.5–5.1)
Sodium: 141 mmol/L (ref 135–145)

## 2021-03-21 LAB — LIPID PANEL
Cholesterol: 163 mg/dL (ref 0–200)
HDL: 33 mg/dL — ABNORMAL LOW (ref >40)
LDL Cholesterol: 118 mg/dL — ABNORMAL HIGH (ref 0–99)
Total CHOL/HDL Ratio: 4.9 RATIO
Triglycerides: 62 mg/dL (ref <150)
VLDL: 12 mg/dL (ref 0–40)

## 2021-03-21 LAB — HEMOGLOBIN A1C
Hgb A1c MFr Bld: 6.4 % — ABNORMAL HIGH (ref 4.8–5.6)
Mean Plasma Glucose: 136.98 mg/dL

## 2021-03-21 LAB — ABO/RH: ABO/RH(D): B POS

## 2021-03-21 LAB — HEPARIN LEVEL (UNFRACTIONATED): Heparin Unfractionated: 0.39 IU/mL (ref 0.30–0.70)

## 2021-03-21 LAB — PREPARE RBC (CROSSMATCH)

## 2021-03-21 LAB — APTT: aPTT: 109 seconds — ABNORMAL HIGH (ref 24–36)

## 2021-03-21 MED ORDER — PLASMA-LYTE 148 IV SOLN
INTRAVENOUS | Status: DC
  Filled 2021-03-21: qty 2.5

## 2021-03-21 MED ORDER — EPINEPHRINE HCL 5 MG/250ML IV SOLN IN NS
0.0000 ug/min | INTRAVENOUS | Status: DC
  Filled 2021-03-21 (×2): qty 250

## 2021-03-21 MED ORDER — METOPROLOL TARTRATE 12.5 MG HALF TABLET
12.5000 mg | ORAL_TABLET | Freq: Once | ORAL | Status: AC
  Administered 2021-03-22: 12.5 mg via ORAL
  Filled 2021-03-21: qty 1

## 2021-03-21 MED ORDER — CHLORHEXIDINE GLUCONATE CLOTH 2 % EX PADS
6.0000 | MEDICATED_PAD | Freq: Once | CUTANEOUS | Status: AC
  Administered 2021-03-22: 6 via TOPICAL

## 2021-03-21 MED ORDER — DEXMEDETOMIDINE HCL IN NACL 400 MCG/100ML IV SOLN
0.1000 ug/kg/h | INTRAVENOUS | Status: AC
  Administered 2021-03-22: .5 ug/kg/h via INTRAVENOUS
  Filled 2021-03-21 (×2): qty 100

## 2021-03-21 MED ORDER — NITROGLYCERIN IN D5W 200-5 MCG/ML-% IV SOLN
2.0000 ug/min | INTRAVENOUS | Status: DC
  Filled 2021-03-21 (×2): qty 250

## 2021-03-21 MED ORDER — TRANEXAMIC ACID (OHS) BOLUS VIA INFUSION
15.0000 mg/kg | INTRAVENOUS | Status: AC
  Administered 2021-03-22: 1368 mg via INTRAVENOUS
  Filled 2021-03-21: qty 1368

## 2021-03-21 MED ORDER — MILRINONE LACTATE IN DEXTROSE 20-5 MG/100ML-% IV SOLN
0.3000 ug/kg/min | INTRAVENOUS | Status: DC
  Filled 2021-03-21 (×2): qty 100

## 2021-03-21 MED ORDER — BISACODYL 5 MG PO TBEC
5.0000 mg | DELAYED_RELEASE_TABLET | Freq: Once | ORAL | Status: AC
  Administered 2021-03-21: 5 mg via ORAL
  Filled 2021-03-21: qty 1

## 2021-03-21 MED ORDER — SODIUM CHLORIDE 0.9 % IV SOLN
1.5000 g | INTRAVENOUS | Status: AC
  Administered 2021-03-22: 1.5 g via INTRAVENOUS
  Filled 2021-03-21: qty 1.5

## 2021-03-21 MED ORDER — CHLORHEXIDINE GLUCONATE CLOTH 2 % EX PADS
6.0000 | MEDICATED_PAD | Freq: Once | CUTANEOUS | Status: AC
  Administered 2021-03-21: 6 via TOPICAL

## 2021-03-21 MED ORDER — INSULIN REGULAR(HUMAN) IN NACL 100-0.9 UT/100ML-% IV SOLN
INTRAVENOUS | Status: AC
  Administered 2021-03-22: 1 [IU]/h via INTRAVENOUS
  Filled 2021-03-21 (×2): qty 100

## 2021-03-21 MED ORDER — TEMAZEPAM 15 MG PO CAPS
15.0000 mg | ORAL_CAPSULE | Freq: Once | ORAL | Status: AC | PRN
  Administered 2021-03-21: 15 mg via ORAL
  Filled 2021-03-21: qty 1

## 2021-03-21 MED ORDER — MAGNESIUM SULFATE 50 % IJ SOLN
40.0000 meq | INTRAMUSCULAR | Status: DC
  Filled 2021-03-21: qty 9.85

## 2021-03-21 MED ORDER — FUROSEMIDE 10 MG/ML IJ SOLN
40.0000 mg | Freq: Two times a day (BID) | INTRAMUSCULAR | Status: DC
  Administered 2021-03-21 (×2): 40 mg via INTRAVENOUS
  Filled 2021-03-21 (×2): qty 4

## 2021-03-21 MED ORDER — NOREPINEPHRINE 4 MG/250ML-% IV SOLN
0.0000 ug/min | INTRAVENOUS | Status: AC
  Administered 2021-03-22: 2 ug/min via INTRAVENOUS
  Filled 2021-03-21 (×2): qty 250

## 2021-03-21 MED ORDER — TRANEXAMIC ACID 1000 MG/10ML IV SOLN
1.5000 mg/kg/h | INTRAVENOUS | Status: AC
  Administered 2021-03-22: 1.5 mg/kg/h via INTRAVENOUS
  Filled 2021-03-21: qty 25

## 2021-03-21 MED ORDER — MANNITOL 20 % IV SOLN
Freq: Once | INTRAVENOUS | Status: DC
  Filled 2021-03-21: qty 13

## 2021-03-21 MED ORDER — VANCOMYCIN HCL 1500 MG/300ML IV SOLN
1500.0000 mg | INTRAVENOUS | Status: AC
  Administered 2021-03-22: 1500 mg via INTRAVENOUS
  Filled 2021-03-21 (×2): qty 300

## 2021-03-21 MED ORDER — CHLORHEXIDINE GLUCONATE 0.12 % MT SOLN
15.0000 mL | Freq: Once | OROMUCOSAL | Status: AC
  Administered 2021-03-22: 15 mL via OROMUCOSAL
  Filled 2021-03-21: qty 15

## 2021-03-21 MED ORDER — PHENYLEPHRINE HCL-NACL 20-0.9 MG/250ML-% IV SOLN
30.0000 ug/min | INTRAVENOUS | Status: DC
  Filled 2021-03-21 (×2): qty 250

## 2021-03-21 MED ORDER — SODIUM CHLORIDE 0.9 % IV SOLN
INTRAVENOUS | Status: DC
  Filled 2021-03-21: qty 30

## 2021-03-21 MED ORDER — POTASSIUM CHLORIDE 2 MEQ/ML IV SOLN
80.0000 meq | INTRAVENOUS | Status: DC
  Filled 2021-03-21: qty 40

## 2021-03-21 MED ORDER — TRANEXAMIC ACID (OHS) PUMP PRIME SOLUTION
2.0000 mg/kg | INTRAVENOUS | Status: DC
  Filled 2021-03-21: qty 1.82

## 2021-03-21 MED ORDER — SODIUM CHLORIDE 0.9 % IV SOLN
750.0000 mg | INTRAVENOUS | Status: DC
  Filled 2021-03-21: qty 750

## 2021-03-21 NOTE — Progress Notes (Signed)
Pt has been up in room. Declined ambulation, irritable regarding MRI scheduling. He had questions regarding surgery therefore discussed IS, sternal precaution, mobility post op and d/c planning with pt and wife. Receptive. He is performing 1600 mL on IS. Will can be with him at d/c.  [id number] Ethelda Chick CES, ACSM 2:40 PM 03/21/2021

## 2021-03-21 NOTE — Progress Notes (Addendum)
Advanced Heart Failure Rounding Note  PCP-Cardiologist: Donato Schultz, MD   Subjective:   03/20/21 - Cath -->High-grade subtotal stenoses of the LAD, left circumflex, and total occlusion of the previously placed stents in the circumflex marginal and RCA. There are extensive collaterals supplying the distal RCA, and collaterals supplying the obtuse marginal vessel. Collaterals are jeopardized due to the high-grade subtotal stenoses of the LAD and circumflex vessel. There are excellent distal targets. CI 1.7. PCWP 30. Started on IV lasix, spiro, and dig. Negative 1.5 liters.   Remains orthopneic. Feels better sitting up.    Objective:   Weight Range: 91.2 kg Body mass index is 29.7 kg/m.   Vital Signs:   Temp:  [96.2 F (35.7 C)-99.1 F (37.3 C)] 97.9 F (36.6 C) (04/12 0822) Pulse Rate:  [85-105] 87 (04/12 0822) Resp:  [16-27] 16 (04/12 0822) BP: (107-146)/(78-96) 111/87 (04/12 0822) SpO2:  [95 %-100 %] 100 % (04/12 0822) Weight:  [91.2 kg] 91.2 kg (04/12 0646) Last BM Date: 03/19/21  Weight change: Filed Weights   03/20/21 0532 03/21/21 0646  Weight: 93 kg 91.2 kg    Intake/Output:   Intake/Output Summary (Last 24 hours) at 03/21/2021 0823 Last data filed at 03/21/2021 0700 Gross per 24 hour  Intake 567.71 ml  Output 2150 ml  Net -1582.29 ml      Physical Exam    General:  Sitting on the side of the bed.  No resp difficulty HEENT: Normal Neck: Supple. JVP 7-8  . Carotids 2+ bilat; no bruits. No lymphadenopathy or thyromegaly appreciated. Cor: PMI nondisplaced. Regular rate & rhythm. No rubs, gallops or murmurs. Lungs: Clear Abdomen: Soft, nontender, nondistended. No hepatosplenomegaly. No bruits or masses. Good bowel sounds. Extremities: No cyanosis, clubbing, rash, edema Neuro: Alert & orientedx3, cranial nerves grossly intact. moves all 4 extremities w/o difficulty. Affect pleasant   Telemetry   SR -ST with occasional PVCs.    EKG    N/a   Labs     CBC Recent Labs    03/20/21 0815 03/20/21 0820  HGB 16.0  16.0 16.0  HCT 47.0  47.0 47.0   Basic Metabolic Panel Recent Labs    00/92/33 0815 03/20/21 0820  NA 143  143 143  K 4.3  4.3 4.4   Liver Function Tests No results for input(s): AST, ALT, ALKPHOS, BILITOT, PROT, ALBUMIN in the last 72 hours. No results for input(s): LIPASE, AMYLASE in the last 72 hours. Cardiac Enzymes No results for input(s): CKTOTAL, CKMB, CKMBINDEX, TROPONINI in the last 72 hours.  BNP: BNP (last 3 results) Recent Labs    02/13/21 1356  BNP 998.0*    ProBNP (last 3 results) Recent Labs    03/14/21 0731  PROBNP 4,235*     D-Dimer No results for input(s): DDIMER in the last 72 hours. Hemoglobin A1C No results for input(s): HGBA1C in the last 72 hours. Fasting Lipid Panel No results for input(s): CHOL, HDL, LDLCALC, TRIG, CHOLHDL, LDLDIRECT in the last 72 hours. Thyroid Function Tests No results for input(s): TSH, T4TOTAL, T3FREE, THYROIDAB in the last 72 hours.  Invalid input(s): FREET3  Other results:   Imaging    VAS US DOPPLER PRE CABG  Result Date: 03/20/2021 PREOPERATIVE VASCULAR EVALUATION  Indications:      Pre-CABG. Risk Factors:     Coronary artery disease. Comparison Study: NO PRIOR Performing Technologist: Blanch Media RVS  Examination Guidelines: A complete evaluation includes B-mode imaging, spectral Doppler, color Doppler, and power Doppler as needed  of all accessible portions of each vessel. Bilateral testing is considered an integral part of a complete examination. Limited examinations for reoccurring indications may be performed as noted.  Right Carotid Findings: +----------+--------+--------+--------+------------+--------+           PSV cm/sEDV cm/sStenosisDescribe    Comments +----------+--------+--------+--------+------------+--------+ CCA Prox  66      22              heterogenous          +----------+--------+--------+--------+------------+--------+ CCA Distal51      16              heterogenous         +----------+--------+--------+--------+------------+--------+ ICA Prox  70      27      1-39%   heterogenous         +----------+--------+--------+--------+------------+--------+ ICA Distal75      28                                   +----------+--------+--------+--------+------------+--------+ ECA       67      11                                   +----------+--------+--------+--------+------------+--------+ Portions of this table do not appear on this page. +----------+--------+-------+--------+------------+           PSV cm/sEDV cmsDescribeArm Pressure +----------+--------+-------+--------+------------+ Subclavian112                                 +----------+--------+-------+--------+------------+ +---------+--------+--+--------+--+---------+ VertebralPSV cm/s36EDV cm/s11Antegrade +---------+--------+--+--------+--+---------+ Left Carotid Findings: +----------+--------+--------+--------+------------+--------+           PSV cm/sEDV cm/sStenosisDescribe    Comments +----------+--------+--------+--------+------------+--------+ CCA Prox  107     32              heterogenous         +----------+--------+--------+--------+------------+--------+ CCA Distal43      11              heterogenous         +----------+--------+--------+--------+------------+--------+ ICA Prox  53      23      1-39%   heterogenous         +----------+--------+--------+--------+------------+--------+ ICA Distal88      35                                   +----------+--------+--------+--------+------------+--------+ ECA       78                                           +----------+--------+--------+--------+------------+--------+ +----------+--------+--------+--------+------------+ SubclavianPSV cm/sEDV cm/sDescribeArm Pressure  +----------+--------+--------+--------+------------+           77                                   +----------+--------+--------+--------+------------+ +---------+--------+--+--------+--+---------+ VertebralPSV cm/s35EDV cm/s11Antegrade +---------+--------+--+--------+--+---------+  ABI Findings: +--------+------------------+-----+---------+--------+ Right   Rt Pressure (mmHg)IndexWaveform Comment  +--------+------------------+-----+---------+--------+ [id number]                    triphasic         +--------+------------------+-----+---------+--------+  PTA     141               0.97 triphasic         +--------+------------------+-----+---------+--------+ DP      135               0.93 triphasic         +--------+------------------+-----+---------+--------+ +--------+------------------+-----+---------+-------+ Left    Lt Pressure (mmHg)IndexWaveform Comment +--------+------------------+-----+---------+-------+ [id number]                    triphasic        +--------+------------------+-----+---------+-------+ PTA     155               1.07 triphasic        +--------+------------------+-----+---------+-------+ DP      150               1.03 triphasic        +--------+------------------+-----+---------+-------+ +-------+---------------+----------------+ ABI/TBIToday's ABI/TBIPrevious ABI/TBI +-------+---------------+----------------+ Right  0.97                            +-------+---------------+----------------+ Left   1.07                            +-------+---------------+----------------+  Right Doppler Findings: +--------+--------+-----+---------+--------+ Site    PressureIndexDoppler  Comments +--------+--------+-----+---------+--------+ [id number]          triphasic         +--------+--------+-----+---------+--------+ Radial               triphasic         +--------+--------+-----+---------+--------+ Ulnar                 triphasic         +--------+--------+-----+---------+--------+  Left Doppler Findings: +--------+--------+-----+---------+--------+ Site    PressureIndexDoppler  Comments +--------+--------+-----+---------+--------+ [id number]          triphasic         +--------+--------+-----+---------+--------+ Radial               triphasic         +--------+--------+-----+---------+--------+ Ulnar                triphasic         +--------+--------+-----+---------+--------+  Summary: Right Carotid: Velocities in the right ICA are consistent with a 1-39% stenosis. Left Carotid: Velocities in the left ICA are consistent with a 1-39% stenosis. Vertebrals: Bilateral vertebral arteries demonstrate antegrade flow. Right ABI: Resting right ankle-brachial index is within normal range. No evidence of significant right lower extremity arterial disease. Left ABI: Resting left ankle-brachial index is within normal range. No evidence of significant left lower extremity arterial disease. Right Upper Extremity: Doppler waveforms remain within normal limits with right radial compression. Doppler waveforms remain within normal limits with right ulnar compression. Left Upper Extremity: Doppler waveforms remain within normal limits with left radial compression. Doppler waveforms decrease 50% with left ulnar compression.  Electronically signed by Sherald Hess MD on 03/20/2021 at 4:30:45 PM.    Final       Medications:     Scheduled Medications: . aspirin  81 mg Oral Daily  . atorvastatin  80 mg Oral Daily  . digoxin  0.125 mg Oral Daily  . pneumococcal 23 valent vaccine  0.5 mL Intramuscular Tomorrow-1000  . sodium chloride flush  3 mL Intravenous Q12H  . spironolactone  12.5 mg Oral Daily  Infusions: . sodium chloride    . heparin 1,350 Units/hr (03/21/21 0016)     PRN Medications:  sodium chloride, acetaminophen, diazepam, ondansetron (ZOFRAN) IV, sodium chloride  flush   Assessment/Plan  1. CAD - Had MI in 2004 with DESx2 with totally occluded CFX (infarct-related artery) and 80% mRCA stenosis. Patient had Taxus DES to both vessels.  -  Cath today by Dr Tresa EndoKelly -->High-grade subtotal stenoses of the LAD, left circumflex, and total occlusion of the previously placed stents in the circumflex marginal and RCA. There are extensive collaterals supplying the distal RCA, and collaterals supplying the obtuse marginal vessel. Collaterals are jeopardized due to the high-grade subtotal stenoses of the LAD and circumflex vessel. There are excellent distal targets. - No chest pain. On asa + statin.  - CT surgery consulted  - CMRI for viability  2. A/C Systolic Heart Failure 03/14/21 EF 20-25% with normal RV  -Cath today with elevated wedge 31 , CI 1.7.  - Volume status improving. Continue IV lasix.   - Watch K tends to run high.  - Continue digoxin.  - Continue spironolactone.  - No bb for now.  - Hold off arni until BMET back. May need to use hydralazine/imdur.   3. Hyperlipidemia  On high intensity statin.   4. CKD Stage IIIa Creatinine baseline 1.4-1.6  BMET pending. .   Plan for CMRI today to assess for viability.    Length of Stay: 1  Amy Clegg, NP  03/21/2021, 8:23 AM  Advanced Heart Failure Team Pager [phone] (M-F; 7a - 5p)  Please contact [facility] for night-coverage after hours (5p -7a ) and weekends on [email or web address]  Patient seen with NP, agree with the above note.   Orthopneic, otherwise not short of breath.  No chest pain.  I/Os negative with IV Lasix.  SBP 110s generally. Creatinine stable 1.58.   General: NAD Neck: JVP 8 cm, no thyromegaly or thyroid nodule.  Lungs: Clear to auscultation bilaterally with normal respiratory effort. CV: Nondisplaced PMI.  Heart regular S1/S2, no S3/S4, no murmur.  No peripheral edema.   Abdomen: Soft, nontender, no hepatosplenomegaly, no distention.  Skin: Intact without lesions or  rashes.  Neurologic: Alert and oriented x 3.  Psych: Normal affect. Extremities: No clubbing or cyanosis.  HEENT: Normal.   1. CAD: Had MI in 2004 with DESx2 with totally occluded CFX (infarct-related artery) and 80% mRCA stenosis. Patient had Taxus DES to both vessels.  Recently, noted to have EF down to 20-25% on echo, set up for coronary angiography. LHC on 4/11 showed high-grade subtotal stenoses of the LAD, left circumflex, and total occlusion of the previously placed stents in the circumflex marginal and RCA. There are extensive collaterals supplying the distal RCA, and collaterals supplying the obtuse marginal vessel. Collaterals are jeopardized due to the high-grade subtotal stenoses of the LAD and circumflex vessel. There are excellent distal targets. Patient would be best-served by CABG.   - Cardiac MRI today.  If there is significant viability, should proceed with CABG.  - Continue ASA 81  - Continue atorvastatin 80 daily.  2. Acute systolic CHF: Echo in 4/22 with EF 20-25%, severe LV dilation, mildly dilated RV with normal systolic function, moderate MR. Ischemic cardiomyopathy.  RHC 4/11 with elevated PCWP and CI 1.7. Mild residual volume overload with orthopnea.  - Continue Lasix 40 mg IV bid today, probably back to po tomorrow.  - Continue digoxin 0.125 - Spironolactone 12.5.  - Would avoid starting Entresto right before  CABG, would like him on this afterwards.  - Stable hemodynamics at this time, think we can hold off milrinone.  3. CKD stage 3: Stable creatinine at 1.58 (baseline around 1.6).  4. Hyperlipidemia: Atorvastatin 80 daily.   Marca Ancona 03/21/2021 2:50 PM

## 2021-03-21 NOTE — Progress Notes (Signed)
ANTICOAGULATION CONSULT NOTE - Follow Up Consult  Pharmacy Consult for heparin Indication: chest pain/ACS  No Known Allergies  Patient Measurements: Height: 5\' 9"  (175.3 cm) Weight: 91.2 kg (201 lb 1.6 oz) IBW/kg (Calculated) : 70.7 Heparin Dosing Weight: 89kg  Vital Signs: Temp: 97.9 F (36.6 C) (04/12 0822) Temp Source: Axillary (04/12 0822) BP: 111/87 (04/12 0822) Pulse Rate: 87 (04/12 0822)  Labs: Recent Labs    03/20/21 0815 03/20/21 0820 03/20/21 2228 03/21/21 0820 03/21/21 0831  HGB 16.0  16.0 16.0  --   --   --   HCT 47.0  47.0 47.0  --   --   --   HEPARINUNFRC  --   --  0.27* 0.39  --   CREATININE  --   --   --   --  1.58*    Estimated Creatinine Clearance: 52 mL/min (A) (by C-G formula based on SCr of 1.58 mg/dL (H)).   Medical History: Past Medical History:  Diagnosis Date  . CAD (coronary artery disease)   . HCV antibody positive    previous eval at [facility] hepatology clinic  . STEMI (ST elevation myocardial infarction) (HCC) 2004   with totally occluded CFX (infarct related artery) and 80% mRCA stenosis. Taxus DES to both vessles. EF was 50-55% by LV-gram    Medications:  Medications Prior to Admission  Medication Sig Dispense Refill Last Dose  . aspirin EC 81 MG tablet Take 81 mg by mouth daily. Swallow whole.   03/19/2021 at 0600  . furosemide (LASIX) 20 MG tablet Take one tablet by mouth daily for three days then take as needed for shortness of breath. (Patient taking differently: Take 20 mg by mouth daily as needed (for shortness of breath.).) 30 tablet 11 03/19/2021 at 0600  . loratadine (CLARITIN) 10 MG tablet Take 10 mg by mouth daily as needed for allergies.   03/19/2021 at 0600  . Multiple Vitamin (MULTIVITAMIN WITH MINERALS) TABS tablet Take 1 tablet by mouth daily.   Past Week at Unknown time   Scheduled:  . aspirin  81 mg Oral Daily  . atorvastatin  80 mg Oral Daily  . digoxin  0.125 mg Oral Daily  . furosemide  40 mg Intravenous BID  .  pneumococcal 23 valent vaccine  0.5 mL Intramuscular Tomorrow-1000  . sodium chloride flush  3 mL Intravenous Q12H  . spironolactone  12.5 mg Oral Daily    Assessment: 65 yo male s/p cath with multivessel CAD and plans for CABG. Pharmacy consulted to dose heparin until CABG next week  Heparin drip 1350 uts/hr and heparin level 0.39 at goal.  No bleeding noted, CBC 4/11 stable will recheck tomorrow    Goal of Therapy:  Heparin level 0.3-0.7 units/ml Monitor platelets by anticoagulation protocol: Yes   Plan:   -Continue heparin 1350 units/hr a -Heparin level  daily wth CBC daily   6/11 Pharm.D. CPP, BCPS Clinical Pharmacist [phone] 03/21/2021 10:11 AM   **Pharmacist phone directory can now be found on [email or web address] ([email or web address]).  Listed under [facility].

## 2021-03-22 ENCOUNTER — Inpatient Hospital Stay (HOSPITAL_COMMUNITY): Payer: Medicare Other

## 2021-03-22 ENCOUNTER — Inpatient Hospital Stay (HOSPITAL_COMMUNITY)
Admission: RE | Disposition: A | Discharge status: Home / Self Care | Payer: Self-pay | Source: Home / Self Care | Attending: Thoracic Surgery (Cardiothoracic Vascular Surgery)

## 2021-03-22 ENCOUNTER — Inpatient Hospital Stay (HOSPITAL_COMMUNITY): Payer: Medicare Other | Admitting: Certified Registered Nurse Anesthetist

## 2021-03-22 ENCOUNTER — Encounter (HOSPITAL_COMMUNITY): Payer: Self-pay | Admitting: Cardiovascular Disease

## 2021-03-22 DIAGNOSIS — I2510 Atherosclerotic heart disease of native coronary artery without angina pectoris: Secondary | ICD-10-CM | POA: Diagnosis present

## 2021-03-22 DIAGNOSIS — I5021 Acute systolic (congestive) heart failure: Secondary | ICD-10-CM

## 2021-03-22 DIAGNOSIS — I25110 Atherosclerotic heart disease of native coronary artery with unstable angina pectoris: Secondary | ICD-10-CM | POA: Diagnosis not present

## 2021-03-22 HISTORY — PX: CORONARY ARTERY BYPASS GRAFT: SHX141

## 2021-03-22 HISTORY — PX: TEE WITHOUT CARDIOVERSION: SHX5443

## 2021-03-22 LAB — POCT I-STAT EG7
Acid-Base Excess: 2 mmol/L (ref 0.0–2.0)
Bicarbonate: 28 mmol/L (ref 20.0–28.0)
Calcium, Ion: 0.93 mmol/L — ABNORMAL LOW (ref 1.15–1.40)
HCT: 33 % — ABNORMAL LOW (ref 39.0–52.0)
Hemoglobin: 11.2 g/dL — ABNORMAL LOW (ref 13.0–17.0)
O2 Saturation: 82 %
Potassium: 4.5 mmol/L (ref 3.5–5.1)
Sodium: 141 mmol/L (ref 135–145)
TCO2: 30 mmol/L (ref 22–32)
pCO2, Ven: 50.6 mmHg (ref 44.0–60.0)
pH, Ven: 7.352 (ref 7.250–7.430)
pO2, Ven: 49 mmHg — ABNORMAL HIGH (ref 32.0–45.0)

## 2021-03-22 LAB — PLATELET COUNT: Platelets: 71 10*3/uL — ABNORMAL LOW (ref 150–400)

## 2021-03-22 LAB — CBC
HCT: 52.4 % — ABNORMAL HIGH (ref 39.0–52.0)
HCT: 35.2 % — ABNORMAL LOW (ref 39.0–52.0)
Hemoglobin: 17.6 g/dL — ABNORMAL HIGH (ref 13.0–17.0)
Hemoglobin: 11.7 g/dL — ABNORMAL LOW (ref 13.0–17.0)
MCH: 29.6 pg (ref 26.0–34.0)
MCH: 29.8 pg (ref 26.0–34.0)
MCHC: 33.6 g/dL (ref 30.0–36.0)
MCHC: 33.2 g/dL (ref 30.0–36.0)
MCV: 88.2 fL (ref 80.0–100.0)
MCV: 89.6 fL (ref 80.0–100.0)
Platelets: 118 10*3/uL — ABNORMAL LOW (ref 150–400)
Platelets: 57 10*3/uL — ABNORMAL LOW (ref 150–400)
RBC: 5.94 MIL/uL — ABNORMAL HIGH (ref 4.22–5.81)
RBC: 3.93 MIL/uL — ABNORMAL LOW (ref 4.22–5.81)
RDW: 12.5 % (ref 11.5–15.5)
RDW: 12.4 % (ref 11.5–15.5)
WBC: 6.7 10*3/uL (ref 4.0–10.5)
WBC: 8.2 10*3/uL (ref 4.0–10.5)
nRBC: 0 % (ref 0.0–0.2)
nRBC: 0 % (ref 0.0–0.2)

## 2021-03-22 LAB — CBC WITH DIFFERENTIAL/PLATELET
Abs Immature Granulocytes: 0.03 10*3/uL (ref 0.00–0.07)
Basophils Absolute: 0 10*3/uL (ref 0.0–0.1)
Basophils Relative: 0 %
Eosinophils Absolute: 0 10*3/uL (ref 0.0–0.5)
Eosinophils Relative: 0 %
HCT: 34.4 % — ABNORMAL LOW (ref 39.0–52.0)
Hemoglobin: 11.5 g/dL — ABNORMAL LOW (ref 13.0–17.0)
Immature Granulocytes: 0 %
Lymphocytes Relative: 4 %
Lymphs Abs: 0.4 10*3/uL — ABNORMAL LOW (ref 0.7–4.0)
MCH: 30 pg (ref 26.0–34.0)
MCHC: 33.4 g/dL (ref 30.0–36.0)
MCV: 89.8 fL (ref 80.0–100.0)
Monocytes Absolute: 0.7 10*3/uL (ref 0.1–1.0)
Monocytes Relative: 7 %
Neutro Abs: 8.8 10*3/uL — ABNORMAL HIGH (ref 1.7–7.7)
Neutrophils Relative %: 89 %
Platelets: 66 10*3/uL — ABNORMAL LOW (ref 150–400)
RBC: 3.83 MIL/uL — ABNORMAL LOW (ref 4.22–5.81)
RDW: 12.5 % (ref 11.5–15.5)
WBC: 10 10*3/uL (ref 4.0–10.5)
nRBC: 0 % (ref 0.0–0.2)

## 2021-03-22 LAB — POCT I-STAT, CHEM 8
BUN: 22 mg/dL (ref 8–23)
BUN: 19 mg/dL (ref 8–23)
BUN: 22 mg/dL (ref 8–23)
BUN: 23 mg/dL (ref 8–23)
Calcium, Ion: 1.27 mmol/L (ref 1.15–1.40)
Calcium, Ion: 0.9 mmol/L — ABNORMAL LOW (ref 1.15–1.40)
Calcium, Ion: 1.18 mmol/L (ref 1.15–1.40)
Calcium, Ion: 0.99 mmol/L — ABNORMAL LOW (ref 1.15–1.40)
Chloride: 104 mmol/L (ref 98–111)
Chloride: 104 mmol/L (ref 98–111)
Chloride: 98 mmol/L (ref 98–111)
Chloride: 101 mmol/L (ref 98–111)
Creatinine, Ser: 1.5 mg/dL — ABNORMAL HIGH (ref 0.61–1.24)
Creatinine, Ser: 1.3 mg/dL — ABNORMAL HIGH (ref 0.61–1.24)
Creatinine, Ser: 1.6 mg/dL — ABNORMAL HIGH (ref 0.61–1.24)
Creatinine, Ser: 1.5 mg/dL — ABNORMAL HIGH (ref 0.61–1.24)
Glucose, Bld: 125 mg/dL — ABNORMAL HIGH (ref 70–99)
Glucose, Bld: 129 mg/dL — ABNORMAL HIGH (ref 70–99)
Glucose, Bld: 160 mg/dL — ABNORMAL HIGH (ref 70–99)
Glucose, Bld: 124 mg/dL — ABNORMAL HIGH (ref 70–99)
HCT: 48 % (ref 39.0–52.0)
HCT: 31 % — ABNORMAL LOW (ref 39.0–52.0)
HCT: 46 % (ref 39.0–52.0)
HCT: 30 % — ABNORMAL LOW (ref 39.0–52.0)
Hemoglobin: 16.3 g/dL (ref 13.0–17.0)
Hemoglobin: 10.5 g/dL — ABNORMAL LOW (ref 13.0–17.0)
Hemoglobin: 15.6 g/dL (ref 13.0–17.0)
Hemoglobin: 10.2 g/dL — ABNORMAL LOW (ref 13.0–17.0)
Potassium: 4 mmol/L (ref 3.5–5.1)
Potassium: 4.4 mmol/L (ref 3.5–5.1)
Potassium: 4.6 mmol/L (ref 3.5–5.1)
Potassium: 6 mmol/L — ABNORMAL HIGH (ref 3.5–5.1)
Sodium: 142 mmol/L (ref 135–145)
Sodium: 139 mmol/L (ref 135–145)
Sodium: 141 mmol/L (ref 135–145)
Sodium: 134 mmol/L — ABNORMAL LOW (ref 135–145)
TCO2: 27 mmol/L (ref 22–32)
TCO2: 23 mmol/L (ref 22–32)
TCO2: 30 mmol/L (ref 22–32)
TCO2: 26 mmol/L (ref 22–32)

## 2021-03-22 LAB — URINALYSIS, ROUTINE W REFLEX MICROSCOPIC
Bacteria, UA: NONE SEEN
Bilirubin Urine: NEGATIVE
Glucose, UA: NEGATIVE mg/dL
Ketones, ur: NEGATIVE mg/dL
Leukocytes,Ua: NEGATIVE
Nitrite: NEGATIVE
Protein, ur: NEGATIVE mg/dL
Specific Gravity, Urine: 1.009 (ref 1.005–1.030)
pH: 5 (ref 5.0–8.0)

## 2021-03-22 LAB — COMPREHENSIVE METABOLIC PANEL
ALT: 21 U/L (ref 0–44)
AST: 34 U/L (ref 15–41)
Albumin: 3.8 g/dL (ref 3.5–5.0)
Alkaline Phosphatase: 46 U/L (ref 38–126)
Anion gap: 7 (ref 5–15)
BUN: 18 mg/dL (ref 8–23)
CO2: 23 mmol/L (ref 22–32)
Calcium: 8.4 mg/dL — ABNORMAL LOW (ref 8.9–10.3)
Chloride: 108 mmol/L (ref 98–111)
Creatinine, Ser: 1.72 mg/dL — ABNORMAL HIGH (ref 0.61–1.24)
GFR, Estimated: 44 mL/min — ABNORMAL LOW (ref >60)
Glucose, Bld: 179 mg/dL — ABNORMAL HIGH (ref 70–99)
Potassium: 3.6 mmol/L (ref 3.5–5.1)
Sodium: 138 mmol/L (ref 135–145)
Total Bilirubin: 2 mg/dL — ABNORMAL HIGH (ref 0.3–1.2)
Total Protein: 5.3 g/dL — ABNORMAL LOW (ref 6.5–8.1)

## 2021-03-22 LAB — PREPARE RBC (CROSSMATCH)

## 2021-03-22 LAB — POCT I-STAT 7, (LYTES, BLD GAS, ICA,H+H)
Acid-Base Excess: 0 mmol/L (ref 0.0–2.0)
Acid-Base Excess: 1 mmol/L (ref 0.0–2.0)
Acid-base deficit: 1 mmol/L (ref 0.0–2.0)
Acid-base deficit: 2 mmol/L (ref 0.0–2.0)
Acid-base deficit: 4 mmol/L — ABNORMAL HIGH (ref 0.0–2.0)
Bicarbonate: 26.2 mmol/L (ref 20.0–28.0)
Bicarbonate: 25.8 mmol/L (ref 20.0–28.0)
Bicarbonate: 24.2 mmol/L (ref 20.0–28.0)
Bicarbonate: 22.5 mmol/L (ref 20.0–28.0)
Bicarbonate: 21.1 mmol/L (ref 20.0–28.0)
Calcium, Ion: 1.25 mmol/L (ref 1.15–1.40)
Calcium, Ion: 1.25 mmol/L (ref 1.15–1.40)
Calcium, Ion: 1.34 mmol/L (ref 1.15–1.40)
Calcium, Ion: 1.22 mmol/L (ref 1.15–1.40)
Calcium, Ion: 1.23 mmol/L (ref 1.15–1.40)
HCT: 49 % (ref 39.0–52.0)
HCT: 33 % — ABNORMAL LOW (ref 39.0–52.0)
HCT: 33 % — ABNORMAL LOW (ref 39.0–52.0)
HCT: 33 % — ABNORMAL LOW (ref 39.0–52.0)
HCT: 31 % — ABNORMAL LOW (ref 39.0–52.0)
Hemoglobin: 16.7 g/dL (ref 13.0–17.0)
Hemoglobin: 11.2 g/dL — ABNORMAL LOW (ref 13.0–17.0)
Hemoglobin: 11.2 g/dL — ABNORMAL LOW (ref 13.0–17.0)
Hemoglobin: 11.2 g/dL — ABNORMAL LOW (ref 13.0–17.0)
Hemoglobin: 10.5 g/dL — ABNORMAL LOW (ref 13.0–17.0)
O2 Saturation: 100 %
O2 Saturation: 100 %
O2 Saturation: 96 %
O2 Saturation: 95 %
O2 Saturation: 97 %
Patient temperature: 37.1
Patient temperature: 37.5
Patient temperature: 37.7
Potassium: 4 mmol/L (ref 3.5–5.1)
Potassium: 5.2 mmol/L — ABNORMAL HIGH (ref 3.5–5.1)
Potassium: 4.2 mmol/L (ref 3.5–5.1)
Potassium: 3.9 mmol/L (ref 3.5–5.1)
Potassium: 3.6 mmol/L (ref 3.5–5.1)
Sodium: 140 mmol/L (ref 135–145)
Sodium: 138 mmol/L (ref 135–145)
Sodium: 140 mmol/L (ref 135–145)
Sodium: 141 mmol/L (ref 135–145)
Sodium: 141 mmol/L (ref 135–145)
TCO2: 28 mmol/L (ref 22–32)
TCO2: 27 mmol/L (ref 22–32)
TCO2: 25 mmol/L (ref 22–32)
TCO2: 24 mmol/L (ref 22–32)
TCO2: 22 mmol/L (ref 22–32)
pCO2 arterial: 45.4 mmHg (ref 32.0–48.0)
pCO2 arterial: 38.9 mmHg (ref 32.0–48.0)
pCO2 arterial: 43.7 mmHg (ref 32.0–48.0)
pCO2 arterial: 36.7 mmHg (ref 32.0–48.0)
pCO2 arterial: 37.3 mmHg (ref 32.0–48.0)
pH, Arterial: 7.368 (ref 7.350–7.450)
pH, Arterial: 7.43 (ref 7.350–7.450)
pH, Arterial: 7.351 (ref 7.350–7.450)
pH, Arterial: 7.397 (ref 7.350–7.450)
pH, Arterial: 7.365 (ref 7.350–7.450)
pO2, Arterial: 375 mmHg — ABNORMAL HIGH (ref 83.0–108.0)
pO2, Arterial: 193 mmHg — ABNORMAL HIGH (ref 83.0–108.0)
pO2, Arterial: 85 mmHg (ref 83.0–108.0)
pO2, Arterial: 77 mmHg — ABNORMAL LOW (ref 83.0–108.0)
pO2, Arterial: 92 mmHg (ref 83.0–108.0)

## 2021-03-22 LAB — BASIC METABOLIC PANEL
Anion gap: 7 (ref 5–15)
BUN: 21 mg/dL (ref 8–23)
CO2: 28 mmol/L (ref 22–32)
Calcium: 9.4 mg/dL (ref 8.9–10.3)
Chloride: 103 mmol/L (ref 98–111)
Creatinine, Ser: 1.76 mg/dL — ABNORMAL HIGH (ref 0.61–1.24)
GFR, Estimated: 42 mL/min — ABNORMAL LOW (ref >60)
Glucose, Bld: 126 mg/dL — ABNORMAL HIGH (ref 70–99)
Potassium: 4.2 mmol/L (ref 3.5–5.1)
Sodium: 138 mmol/L (ref 135–145)

## 2021-03-22 LAB — SURGICAL PCR SCREEN
MRSA, PCR: NEGATIVE
Staphylococcus aureus: NEGATIVE

## 2021-03-22 LAB — PROTIME-INR
INR: 1.5 — ABNORMAL HIGH (ref 0.8–1.2)
Prothrombin Time: 18.1 seconds — ABNORMAL HIGH (ref 11.4–15.2)

## 2021-03-22 LAB — GLUCOSE, CAPILLARY
Glucose-Capillary: 125 mg/dL — ABNORMAL HIGH (ref 70–99)
Glucose-Capillary: 130 mg/dL — ABNORMAL HIGH (ref 70–99)
Glucose-Capillary: 145 mg/dL — ABNORMAL HIGH (ref 70–99)
Glucose-Capillary: 153 mg/dL — ABNORMAL HIGH (ref 70–99)
Glucose-Capillary: 178 mg/dL — ABNORMAL HIGH (ref 70–99)
Glucose-Capillary: 195 mg/dL — ABNORMAL HIGH (ref 70–99)
Glucose-Capillary: 194 mg/dL — ABNORMAL HIGH (ref 70–99)

## 2021-03-22 LAB — HEMOGLOBIN AND HEMATOCRIT, BLOOD
HCT: 33.8 % — ABNORMAL LOW (ref 39.0–52.0)
Hemoglobin: 11.4 g/dL — ABNORMAL LOW (ref 13.0–17.0)

## 2021-03-22 LAB — MAGNESIUM
Magnesium: 2.1 mg/dL (ref 1.7–2.4)
Magnesium: 2.8 mg/dL — ABNORMAL HIGH (ref 1.7–2.4)

## 2021-03-22 LAB — APTT: aPTT: 34 seconds (ref 24–36)

## 2021-03-22 LAB — PHOSPHORUS: Phosphorus: 1.8 mg/dL — ABNORMAL LOW (ref 2.5–4.6)

## 2021-03-22 SURGERY — CORONARY ARTERY BYPASS GRAFTING (CABG)
Anesthesia: General | Site: Chest

## 2021-03-22 MED ORDER — HEPARIN SODIUM (PORCINE) 1000 UNIT/ML IJ SOLN
INTRAMUSCULAR | Status: AC
  Filled 2021-03-22: qty 1

## 2021-03-22 MED ORDER — FAMOTIDINE IN NACL 20-0.9 MG/50ML-% IV SOLN
20.0000 mg | Freq: Two times a day (BID) | INTRAVENOUS | Status: AC
  Administered 2021-03-22 (×2): 20 mg via INTRAVENOUS
  Filled 2021-03-22 (×2): qty 50

## 2021-03-22 MED ORDER — ETOMIDATE 2 MG/ML IV SOLN
INTRAVENOUS | Status: DC | PRN
  Administered 2021-03-22: 12 mg via INTRAVENOUS

## 2021-03-22 MED ORDER — BISACODYL 10 MG RE SUPP: 10.0000 mg | Freq: Every day | RECTAL | Status: DC

## 2021-03-22 MED ORDER — VASOPRESSIN 20 UNITS/100 ML INFUSION FOR SHOCK
0.0000 [IU]/min | INTRAVENOUS | Status: DC
  Administered 2021-03-22: 0.03 [IU]/min via INTRAVENOUS
  Filled 2021-03-22: qty 100

## 2021-03-22 MED ORDER — FENTANYL CITRATE (PF) 250 MCG/5ML IJ SOLN
INTRAMUSCULAR | Status: AC
  Filled 2021-03-22: qty 25

## 2021-03-22 MED ORDER — SODIUM CHLORIDE 0.9% FLUSH: 10.0000 mL | INTRAVENOUS | Status: DC | PRN

## 2021-03-22 MED ORDER — ASPIRIN EC 325 MG PO TBEC
325.0000 mg | DELAYED_RELEASE_TABLET | Freq: Every day | ORAL | Status: DC
  Administered 2021-03-23 – 2021-04-01 (×10): 325 mg via ORAL
  Filled 2021-03-22 (×10): qty 1

## 2021-03-22 MED ORDER — SODIUM CHLORIDE 0.45 % IV SOLN: INTRAVENOUS | Status: DC | PRN

## 2021-03-22 MED ORDER — DOBUTAMINE IN D5W 4-5 MG/ML-% IV SOLN
2.5000 ug/kg/min | INTRAVENOUS | Status: AC
  Administered 2021-03-22: 2.5 ug/kg/min via INTRAVENOUS
  Filled 2021-03-22: qty 250

## 2021-03-22 MED ORDER — ROCURONIUM BROMIDE 10 MG/ML (PF) SYRINGE
PREFILLED_SYRINGE | INTRAVENOUS | Status: DC | PRN
  Administered 2021-03-22 (×2): 50 mg via INTRAVENOUS
  Administered 2021-03-22: 70 mg via INTRAVENOUS
  Administered 2021-03-22: 30 mg via INTRAVENOUS

## 2021-03-22 MED ORDER — ACETAMINOPHEN 650 MG RE SUPP
650.0000 mg | Freq: Once | RECTAL | Status: AC
  Administered 2021-03-22: 650 mg via RECTAL

## 2021-03-22 MED ORDER — NOREPINEPHRINE 4 MG/250ML-% IV SOLN
0.0000 ug/min | INTRAVENOUS | Status: DC
  Administered 2021-03-23: 10 ug/min via INTRAVENOUS
  Administered 2021-03-23: 9 ug/min via INTRAVENOUS
  Administered 2021-03-23: 2 ug/min via INTRAVENOUS
  Filled 2021-03-22 (×3): qty 250

## 2021-03-22 MED ORDER — DEXMEDETOMIDINE HCL IN NACL 400 MCG/100ML IV SOLN
0.0000 ug/kg/h | INTRAVENOUS | Status: DC
  Administered 2021-03-22: 0.7 ug/kg/h via INTRAVENOUS
  Filled 2021-03-22: qty 100

## 2021-03-22 MED ORDER — NITROGLYCERIN IN D5W 200-5 MCG/ML-% IV SOLN: 0.0000 ug/min | INTRAVENOUS | Status: DC

## 2021-03-22 MED ORDER — METOPROLOL TARTRATE 25 MG/10 ML ORAL SUSPENSION: 12.5000 mg | Freq: Two times a day (BID) | ORAL | Status: DC

## 2021-03-22 MED ORDER — ACETAMINOPHEN 160 MG/5ML PO SOLN: 1000.0000 mg | Freq: Four times a day (QID) | ORAL | Status: AC

## 2021-03-22 MED ORDER — ASPIRIN 81 MG PO CHEW
324.0000 mg | CHEWABLE_TABLET | Freq: Every day | ORAL | Status: DC
  Filled 2021-03-22: qty 4

## 2021-03-22 MED ORDER — POTASSIUM CHLORIDE 10 MEQ/50ML IV SOLN: 10.0000 meq | INTRAVENOUS | Status: AC

## 2021-03-22 MED ORDER — PROTAMINE SULFATE 10 MG/ML IV SOLN
INTRAVENOUS | Status: DC | PRN
  Administered 2021-03-22: 280 mg via INTRAVENOUS

## 2021-03-22 MED ORDER — FENTANYL CITRATE (PF) 250 MCG/5ML IJ SOLN
INTRAMUSCULAR | Status: AC
  Filled 2021-03-22: qty 5

## 2021-03-22 MED ORDER — CHLORHEXIDINE GLUCONATE CLOTH 2 % EX PADS
6.0000 | MEDICATED_PAD | Freq: Every day | CUTANEOUS | Status: DC
  Administered 2021-03-22 – 2021-04-01 (×11): 6 via TOPICAL

## 2021-03-22 MED ORDER — GADOBUTROL 1 MMOL/ML IV SOLN
9.0000 mL | Freq: Once | INTRAVENOUS | Status: AC | PRN
  Administered 2021-03-22: 9 mL via INTRAVENOUS

## 2021-03-22 MED ORDER — LACTATED RINGERS IV SOLN: INTRAVENOUS | Status: DC | PRN

## 2021-03-22 MED ORDER — BISACODYL 5 MG PO TBEC
10.0000 mg | DELAYED_RELEASE_TABLET | Freq: Every day | ORAL | Status: DC
  Administered 2021-03-23 – 2021-03-27 (×5): 10 mg via ORAL
  Filled 2021-03-22 (×5): qty 2

## 2021-03-22 MED ORDER — CALCIUM CHLORIDE 10 % IV SOLN
INTRAVENOUS | Status: DC | PRN
  Administered 2021-03-22: 200 mg via INTRAVENOUS

## 2021-03-22 MED ORDER — ACETAMINOPHEN 160 MG/5ML PO SOLN: 650.0000 mg | Freq: Once | ORAL | Status: AC

## 2021-03-22 MED ORDER — DEXTROSE 50 % IV SOLN: 0.0000 mL | INTRAVENOUS | Status: DC | PRN

## 2021-03-22 MED ORDER — SODIUM CHLORIDE 0.9 % IV SOLN
INTRAVENOUS | Status: DC | PRN
  Administered 2021-03-22: 750 mg via INTRAVENOUS

## 2021-03-22 MED ORDER — POTASSIUM CHLORIDE CRYS ER 20 MEQ PO TBCR
20.0000 meq | EXTENDED_RELEASE_TABLET | ORAL | Status: AC
  Administered 2021-03-22 – 2021-03-23 (×3): 20 meq via ORAL
  Filled 2021-03-22 (×3): qty 1

## 2021-03-22 MED ORDER — OXYCODONE HCL 5 MG PO TABS
5.0000 mg | ORAL_TABLET | ORAL | Status: DC | PRN
  Administered 2021-03-22 – 2021-03-23 (×2): 10 mg via ORAL
  Administered 2021-03-23: 5 mg via ORAL
  Administered 2021-03-23 – 2021-03-27 (×4): 10 mg via ORAL
  Filled 2021-03-22 (×3): qty 2
  Filled 2021-03-22: qty 1
  Filled 2021-03-22 (×3): qty 2

## 2021-03-22 MED ORDER — INSULIN REGULAR(HUMAN) IN NACL 100-0.9 UT/100ML-% IV SOLN
INTRAVENOUS | Status: DC
  Administered 2021-03-22: 0.8 [IU]/h via INTRAVENOUS
  Administered 2021-03-23: 2.4 [IU]/h via INTRAVENOUS
  Administered 2021-03-23: 3.6 [IU]/h via INTRAVENOUS
  Filled 2021-03-22: qty 100

## 2021-03-22 MED ORDER — METOPROLOL TARTRATE 12.5 MG HALF TABLET
12.5000 mg | ORAL_TABLET | Freq: Two times a day (BID) | ORAL | Status: DC
  Administered 2021-03-23: 12.5 mg via ORAL
  Filled 2021-03-22: qty 1

## 2021-03-22 MED ORDER — SODIUM CHLORIDE 0.9% FLUSH: 3.0000 mL | INTRAVENOUS | Status: DC | PRN

## 2021-03-22 MED ORDER — HEPARIN SODIUM (PORCINE) 1000 UNIT/ML IJ SOLN
INTRAMUSCULAR | Status: DC | PRN
  Administered 2021-03-22: 32000 [IU] via INTRAVENOUS

## 2021-03-22 MED ORDER — VASOPRESSIN 20 UNIT/ML IV SOLN
INTRAVENOUS | Status: AC
  Filled 2021-03-22: qty 1

## 2021-03-22 MED ORDER — SODIUM CHLORIDE 0.9 % IV SOLN: INTRAVENOUS | Status: DC

## 2021-03-22 MED ORDER — MIDAZOLAM HCL (PF) 10 MG/2ML IJ SOLN
INTRAMUSCULAR | Status: AC
  Filled 2021-03-22: qty 2

## 2021-03-22 MED ORDER — SODIUM CHLORIDE (PF) 0.9 % IJ SOLN
INTRAMUSCULAR | Status: AC
  Filled 2021-03-22: qty 30

## 2021-03-22 MED ORDER — DOCUSATE SODIUM 100 MG PO CAPS
200.0000 mg | ORAL_CAPSULE | Freq: Every day | ORAL | Status: DC
  Administered 2021-03-23 – 2021-03-27 (×5): 200 mg via ORAL
  Filled 2021-03-22 (×5): qty 2

## 2021-03-22 MED ORDER — MIDAZOLAM HCL 5 MG/5ML IJ SOLN
INTRAMUSCULAR | Status: DC | PRN
  Administered 2021-03-22: 3 mg via INTRAVENOUS
  Administered 2021-03-22: 1 mg via INTRAVENOUS
  Administered 2021-03-22: 2 mg via INTRAVENOUS

## 2021-03-22 MED ORDER — PROPOFOL 10 MG/ML IV BOLUS
INTRAVENOUS | Status: AC
  Filled 2021-03-22: qty 20

## 2021-03-22 MED ORDER — EPINEPHRINE HCL 5 MG/250ML IV SOLN IN NS: 0.0000 ug/min | INTRAVENOUS | Status: DC

## 2021-03-22 MED ORDER — SODIUM CHLORIDE 0.9% FLUSH
3.0000 mL | Freq: Two times a day (BID) | INTRAVENOUS | Status: DC
  Administered 2021-03-23 – 2021-03-25 (×4): 3 mL via INTRAVENOUS

## 2021-03-22 MED ORDER — ONDANSETRON HCL 4 MG/2ML IJ SOLN
4.0000 mg | Freq: Four times a day (QID) | INTRAMUSCULAR | Status: DC | PRN
  Administered 2021-03-23 – 2021-03-27 (×5): 4 mg via INTRAVENOUS
  Filled 2021-03-22 (×6): qty 2

## 2021-03-22 MED ORDER — SODIUM CHLORIDE 0.9 % IV SOLN: 250.0000 mL | INTRAVENOUS | Status: DC

## 2021-03-22 MED ORDER — LACTATED RINGERS IV SOLN: 500.0000 mL | Freq: Once | INTRAVENOUS | Status: DC | PRN

## 2021-03-22 MED ORDER — ETOMIDATE 2 MG/ML IV SOLN
INTRAVENOUS | Status: AC
  Filled 2021-03-22: qty 10

## 2021-03-22 MED ORDER — PROTAMINE SULFATE 10 MG/ML IV SOLN
INTRAVENOUS | Status: AC
  Filled 2021-03-22: qty 25

## 2021-03-22 MED ORDER — PHENYLEPHRINE HCL-NACL 20-0.9 MG/250ML-% IV SOLN: 0.0000 ug/min | INTRAVENOUS | Status: DC

## 2021-03-22 MED ORDER — ALBUMIN HUMAN 5 % IV SOLN: INTRAVENOUS | Status: DC | PRN

## 2021-03-22 MED ORDER — MAGNESIUM SULFATE 4 GM/100ML IV SOLN
4.0000 g | Freq: Once | INTRAVENOUS | Status: AC
  Administered 2021-03-22: 4 g via INTRAVENOUS
  Filled 2021-03-22: qty 100

## 2021-03-22 MED ORDER — 0.9 % SODIUM CHLORIDE (POUR BTL) OPTIME
TOPICAL | Status: DC | PRN
  Administered 2021-03-22: 5000 mL

## 2021-03-22 MED ORDER — PHENYLEPHRINE 40 MCG/ML (10ML) SYRINGE FOR IV PUSH (FOR BLOOD PRESSURE SUPPORT)
PREFILLED_SYRINGE | INTRAVENOUS | Status: DC | PRN
  Administered 2021-03-22 (×4): 40 ug via INTRAVENOUS
  Administered 2021-03-22: 80 ug via INTRAVENOUS
  Administered 2021-03-22 (×2): 40 ug via INTRAVENOUS

## 2021-03-22 MED ORDER — ROCURONIUM BROMIDE 10 MG/ML (PF) SYRINGE
PREFILLED_SYRINGE | INTRAVENOUS | Status: AC
  Filled 2021-03-22: qty 20

## 2021-03-22 MED ORDER — PHENYLEPHRINE 40 MCG/ML (10ML) SYRINGE FOR IV PUSH (FOR BLOOD PRESSURE SUPPORT)
PREFILLED_SYRINGE | INTRAVENOUS | Status: AC
  Filled 2021-03-22: qty 10

## 2021-03-22 MED ORDER — TRAMADOL HCL 50 MG PO TABS
50.0000 mg | ORAL_TABLET | ORAL | Status: DC | PRN
  Administered 2021-03-23 – 2021-03-26 (×8): 100 mg via ORAL
  Filled 2021-03-22 (×8): qty 2

## 2021-03-22 MED ORDER — LACTATED RINGERS IV SOLN: INTRAVENOUS | Status: DC

## 2021-03-22 MED ORDER — VASOPRESSIN 20 UNITS/100 ML INFUSION FOR SHOCK
0.0000 [IU]/min | INTRAVENOUS | Status: AC
  Administered 2021-03-22: .04 [IU]/min via INTRAVENOUS
  Filled 2021-03-22: qty 100

## 2021-03-22 MED ORDER — VANCOMYCIN HCL IN DEXTROSE 1-5 GM/200ML-% IV SOLN
1000.0000 mg | Freq: Once | INTRAVENOUS | Status: AC
  Administered 2021-03-22: 1000 mg via INTRAVENOUS
  Filled 2021-03-22: qty 200

## 2021-03-22 MED ORDER — PANTOPRAZOLE SODIUM 40 MG PO TBEC
40.0000 mg | DELAYED_RELEASE_TABLET | Freq: Every day | ORAL | Status: DC
  Administered 2021-03-24 – 2021-04-01 (×9): 40 mg via ORAL
  Filled 2021-03-22 (×9): qty 1

## 2021-03-22 MED ORDER — SODIUM CHLORIDE 0.9 % IV SOLN
1.5000 g | Freq: Two times a day (BID) | INTRAVENOUS | Status: AC
  Administered 2021-03-22 – 2021-03-24 (×4): 1.5 g via INTRAVENOUS
  Filled 2021-03-22 (×4): qty 1.5

## 2021-03-22 MED ORDER — ACETAMINOPHEN 500 MG PO TABS
1000.0000 mg | ORAL_TABLET | Freq: Four times a day (QID) | ORAL | Status: AC
  Administered 2021-03-22 – 2021-03-27 (×16): 1000 mg via ORAL
  Filled 2021-03-22 (×16): qty 2

## 2021-03-22 MED ORDER — MIDAZOLAM HCL 2 MG/2ML IJ SOLN: 2.0000 mg | INTRAMUSCULAR | Status: DC | PRN

## 2021-03-22 MED ORDER — SODIUM CHLORIDE 0.9 % IV SOLN: INTRAVENOUS | Status: DC | PRN

## 2021-03-22 MED ORDER — FENTANYL CITRATE (PF) 250 MCG/5ML IJ SOLN
INTRAMUSCULAR | Status: DC | PRN
  Administered 2021-03-22: 150 ug via INTRAVENOUS
  Administered 2021-03-22: 100 ug via INTRAVENOUS
  Administered 2021-03-22: 150 ug via INTRAVENOUS
  Administered 2021-03-22 (×2): 50 ug via INTRAVENOUS
  Administered 2021-03-22: 200 ug via INTRAVENOUS
  Administered 2021-03-22 (×3): 100 ug via INTRAVENOUS

## 2021-03-22 MED ORDER — MORPHINE SULFATE (PF) 2 MG/ML IV SOLN
1.0000 mg | INTRAVENOUS | Status: DC | PRN
  Administered 2021-03-22: 1 mg via INTRAVENOUS
  Administered 2021-03-22: 3 mg via INTRAVENOUS
  Administered 2021-03-23: 2 mg via INTRAVENOUS
  Filled 2021-03-22 (×4): qty 1

## 2021-03-22 MED ORDER — LORATADINE 10 MG PO TABS: 10.0000 mg | ORAL_TABLET | Freq: Every day | ORAL | Status: DC | PRN

## 2021-03-22 MED ORDER — SODIUM CHLORIDE (PF) 0.9 % IJ SOLN
OROMUCOSAL | Status: DC | PRN
  Administered 2021-03-22 (×3): 4 mL via TOPICAL

## 2021-03-22 MED ORDER — DOBUTAMINE IN D5W 4-5 MG/ML-% IV SOLN
5.0000 ug/kg/min | INTRAVENOUS | Status: DC
  Administered 2021-03-23 (×2): 2.5 ug/kg/min via INTRAVENOUS
  Administered 2021-03-25: 5 ug/kg/min via INTRAVENOUS
  Filled 2021-03-22 (×2): qty 250

## 2021-03-22 MED ORDER — METOPROLOL TARTRATE 5 MG/5ML IV SOLN: 2.5000 mg | INTRAVENOUS | Status: DC | PRN

## 2021-03-22 MED ORDER — ALBUMIN HUMAN 5 % IV SOLN
250.0000 mL | INTRAVENOUS | Status: AC | PRN
  Administered 2021-03-22 – 2021-03-23 (×4): 12.5 g via INTRAVENOUS
  Filled 2021-03-22 (×2): qty 250

## 2021-03-22 MED ORDER — CHLORHEXIDINE GLUCONATE 0.12 % MT SOLN
15.0000 mL | OROMUCOSAL | Status: AC
  Administered 2021-03-22: 15 mL via OROMUCOSAL

## 2021-03-22 MED ORDER — VASOPRESSIN 20 UNIT/ML IV SOLN
INTRAVENOUS | Status: DC | PRN
  Administered 2021-03-22 (×3): 1 [IU] via INTRAVENOUS

## 2021-03-22 SURGICAL SUPPLY — 90 items
ADH SKN CLS APL DERMABOND .7 (GAUZE/BANDAGES/DRESSINGS) ×2
BAG DECANTER FOR FLEXI CONT (MISCELLANEOUS) ×3 IMPLANT
BLADE CLIPPER SURG (BLADE) ×3 IMPLANT
BLADE STERNUM SYSTEM 6 (BLADE) ×3 IMPLANT
BLADE SURG 11 STRL SS (BLADE) ×1 IMPLANT
BNDG ELASTIC 4X5.8 VLCR STR LF (GAUZE/BANDAGES/DRESSINGS) ×3 IMPLANT
BNDG ELASTIC 6X5.8 VLCR STR LF (GAUZE/BANDAGES/DRESSINGS) ×3 IMPLANT
BNDG GAUZE ELAST 4 BULKY (GAUZE/BANDAGES/DRESSINGS) ×3 IMPLANT
CABLE SURGICAL S-101-97-12 (CABLE) ×3 IMPLANT
CANISTER SUCT 3000ML PPV (MISCELLANEOUS) ×3 IMPLANT
CANNULA MC2 2 STG 29/37 NON-V (CANNULA) ×2 IMPLANT
CANNULA MC2 TWO STAGE (CANNULA) ×3
CANNULA NON VENT 20FR 12 (CANNULA) ×3 IMPLANT
CATH ROBINSON RED A/P 18FR (CATHETERS) ×6 IMPLANT
CLIP RETRACTION 3.0MM CORONARY (MISCELLANEOUS) ×3 IMPLANT
CLIP VESOCCLUDE MED 24/CT (CLIP) IMPLANT
CLIP VESOCCLUDE SM WIDE 24/CT (CLIP) IMPLANT
CONN ST 1/2X1/2  BEN (MISCELLANEOUS) ×3
CONN ST 1/2X1/2 BEN (MISCELLANEOUS) ×2 IMPLANT
CONNECTOR BLAKE 2:1 CARIO BLK (MISCELLANEOUS) ×4 IMPLANT
DERMABOND ADVANCED (GAUZE/BANDAGES/DRESSINGS) ×1
DERMABOND ADVANCED .7 DNX12 (GAUZE/BANDAGES/DRESSINGS) IMPLANT
DRAIN CHANNEL 19F RND (DRAIN) ×9 IMPLANT
DRAIN CONNECTOR BLAKE 1:1 (MISCELLANEOUS) ×3 IMPLANT
DRAPE CARDIOVASCULAR INCISE (DRAPES) ×3
DRAPE INCISE IOBAN 66X45 STRL (DRAPES) IMPLANT
DRAPE SLUSH/WARMER DISC (DRAPES) ×3 IMPLANT
DRAPE SRG 135X102X78XABS (DRAPES) ×2 IMPLANT
DRSG AQUACEL AG ADV 3.5X10 (GAUZE/BANDAGES/DRESSINGS) ×3 IMPLANT
DRSG AQUACEL AG ADV 3.5X14 (GAUZE/BANDAGES/DRESSINGS) ×1 IMPLANT
DRSG COVADERM 4X14 (GAUZE/BANDAGES/DRESSINGS) ×3 IMPLANT
ELECT BLADE 4.0 EZ CLEAN MEGAD (MISCELLANEOUS) ×3
ELECT REM PT RETURN 9FT ADLT (ELECTROSURGICAL) ×6
ELECTRODE BLDE 4.0 EZ CLN MEGD (MISCELLANEOUS) ×2 IMPLANT
ELECTRODE REM PT RTRN 9FT ADLT (ELECTROSURGICAL) ×4 IMPLANT
FELT TEFLON 1X6 (MISCELLANEOUS) ×5 IMPLANT
GAUZE SPONGE 4X4 12PLY STRL (GAUZE/BANDAGES/DRESSINGS) ×6 IMPLANT
GAUZE SPONGE 4X4 12PLY STRL LF (GAUZE/BANDAGES/DRESSINGS) ×2 IMPLANT
GLOVE BIO SURGEON STRL SZ7 (GLOVE) ×6 IMPLANT
GLOVE BIOGEL M STRL SZ7.5 (GLOVE) ×6 IMPLANT
GLOVE SURG MICRO LTX SZ6.5 (GLOVE) ×1 IMPLANT
GOWN STRL REUS W/ TWL LRG LVL3 (GOWN DISPOSABLE) ×8 IMPLANT
GOWN STRL REUS W/ TWL XL LVL3 (GOWN DISPOSABLE) ×4 IMPLANT
GOWN STRL REUS W/TWL LRG LVL3 (GOWN DISPOSABLE) ×24
GOWN STRL REUS W/TWL XL LVL3 (GOWN DISPOSABLE) ×6
HEMOSTAT POWDER SURGIFOAM 1G (HEMOSTASIS) ×9 IMPLANT
INSERT SUTURE HOLDER (MISCELLANEOUS) ×3 IMPLANT
KIT BASIN OR (CUSTOM PROCEDURE TRAY) ×3 IMPLANT
KIT MICROPUNCTURE NIT STIFF (SHEATH) ×1 IMPLANT
KIT SUCTION CATH 14FR (SUCTIONS) ×3 IMPLANT
KIT TURNOVER KIT B (KITS) ×3 IMPLANT
KIT VASOVIEW HEMOPRO 2 VH 4000 (KITS) ×3 IMPLANT
LEAD PACING MYOCARDI (MISCELLANEOUS) ×2 IMPLANT
MARKER GRAFT CORONARY BYPASS (MISCELLANEOUS) ×9 IMPLANT
NS IRRIG 1000ML POUR BTL (IV SOLUTION) ×15 IMPLANT
PACK ACCESSORY CANNULA KIT (KITS) ×3 IMPLANT
PACK E OPEN HEART (SUTURE) ×3 IMPLANT
PACK OPEN HEART (CUSTOM PROCEDURE TRAY) ×3 IMPLANT
PAD ARMBOARD 7.5X6 YLW CONV (MISCELLANEOUS) ×6 IMPLANT
PAD ELECT DEFIB RADIOL ZOLL (MISCELLANEOUS) ×3 IMPLANT
PENCIL BUTTON HOLSTER BLD 10FT (ELECTRODE) ×3 IMPLANT
POSITIONER HEAD DONUT 9IN (MISCELLANEOUS) ×3 IMPLANT
PUMP SARN DELFIN (MISCELLANEOUS) ×1 IMPLANT
PUNCH AORTIC ROTATE 4.0MM (MISCELLANEOUS) ×3 IMPLANT
SET CARDIOPLEGIA MPS 5001102 (MISCELLANEOUS) ×1 IMPLANT
STOPCOCK 4 WAY LG BORE MALE ST (IV SETS) ×1 IMPLANT
SUPPORT HEART JANKE-BARRON (MISCELLANEOUS) ×3 IMPLANT
SUT BONE WAX W31G (SUTURE) ×3 IMPLANT
SUT ETHIBOND X763 2 0 SH 1 (SUTURE) ×6 IMPLANT
SUT MNCRL AB 3-0 PS2 18 (SUTURE) ×6 IMPLANT
SUT PDS AB 1 CTX 36 (SUTURE) ×6 IMPLANT
SUT PROLENE 4 0 SH DA (SUTURE) ×3 IMPLANT
SUT PROLENE 5 0 C 1 36 (SUTURE) ×9 IMPLANT
SUT PROLENE 7 0 BV 1 (SUTURE) ×2 IMPLANT
SUT PROLENE 7 0 BV1 MDA (SUTURE) ×3 IMPLANT
SUT STEEL 6MS V (SUTURE) ×6 IMPLANT
SUT STEEL STERNAL CCS#1 18IN (SUTURE) ×3 IMPLANT
SUT VIC AB 2-0 CT1 27 (SUTURE) ×3
SUT VIC AB 2-0 CT1 TAPERPNT 27 (SUTURE) IMPLANT
SUT VICRYL 4-0 PS2 18IN ABS (SUTURE) ×1 IMPLANT
SYSTEM SAHARA CHEST DRAIN ATS (WOUND CARE) ×3 IMPLANT
TAPE CLOTH SURG 4X10 WHT LF (GAUZE/BANDAGES/DRESSINGS) ×1 IMPLANT
TOWEL GREEN STERILE (TOWEL DISPOSABLE) ×3 IMPLANT
TOWEL GREEN STERILE FF (TOWEL DISPOSABLE) ×4 IMPLANT
TRAY CATH LUMEN 1 20CM STRL (SET/KITS/TRAYS/PACK) ×1 IMPLANT
TRAY FOLEY SLVR 16FR TEMP STAT (SET/KITS/TRAYS/PACK) ×3 IMPLANT
TUBING ART PRESS 48 MALE/FEM (TUBING) ×2 IMPLANT
TUBING LAP HI FLOW INSUFFLATIO (TUBING) ×3 IMPLANT
UNDERPAD 30X36 HEAVY ABSORB (UNDERPADS AND DIAPERS) ×3 IMPLANT
WATER STERILE IRR 1000ML POUR (IV SOLUTION) ×6 IMPLANT

## 2021-03-22 NOTE — Hospital Course (Addendum)
HPI   Edward [name] is a 65 year old male patient with a past medical history significant for a myocardial infarction back in 2004 with 2 drug-eluting stents placed one in the circumflex and one in the mid RCA, and hyperlipidemia.  He is a previous smoker and has since stopped back in 2005.  He originally presented with 2 weeks of worsening shortness of breath while riding his bicycle.  He also reports orthopnea.  He did not have traditional chest tightness or pressure.  He then had an echocardiogram back on March 14 2021 which showed an estimated left ejection fraction of 20 to 25%.  He also had a nuclear stress test in which his estimated ejection fraction was 15% with diffuse hypokinesis.  Due to his past history of coronary artery disease and his low ejection fraction a cardiac catheterization was performed.  This showed severe multivessel CAD with high-grade subtotal stenosis of the LAD, left circumflex, and total occlusion of the previously placed stents in the circumflex and RCA.  There are extensive collaterals supplying the distal RCA and collaterals supplying the obtuse marginal vessel.  The collaterals are jeopardized due to the high-grade subtotal stenosis of the LAD and circumflex vessel.  The patient has been referred to [place] for possible surgical revascularization.   The patient lives locally with his wife and leads an active life style. Does not take any prescriptive medications. He was on a statin after his first myocardial infarction but stopped due to intolerance.  Viability study was obtained. Dr. Irving Copas discussed the need for coronary artery bypass grafting surgery. Potential risks, benefits, and complications of the surgery were discussed with the patient and he agreed to proceed with surgery. He underwent a CABG x 3 on 03/22/2021.  Hospital Course: Patient was extubated the evening of surgery without difficulty. He was weaned off Dobutamine, Levophed, and Vasopressin drips. Theone Murdoch, a  line, and foley were removed early in his post operative course. Chest tubes remained for a few days and once output was decreased were removed. He was started on Lopressor but this was then stopped. He was volume overloaded and diuresed. He was weaned off the Insulin drip. His pre op HGA1C was 6.4. He likely has pre diabetes and will need further surveillance and follow up with his medical doctor. He had issues with hypotension. As of 04/15, he was on Dobutamine, Milrinone, and Nor epinephrine drips. He had expected post op blood loss anemia. His last H and H was ***.  He had thrombocytopenia post op. His platelet count went down to ***. He had AKI post op. His creatinine went up to 2.46. In time, creatinine continued to improve.

## 2021-03-22 NOTE — Anesthesia Postprocedure Evaluation (Signed)
Anesthesia Post Note  Patient: Edward [name]  Procedure(s) Performed: CORONARY ARTERY BYPASS GRAFTING (CABG) X  THREE USING LEFT INTERNAL MAMMARY ARTERY AND RIGHT ENDOSCOPIC SAPHENOUS VEIN HARVEST CONDUITS (N/A Chest) TRANSESOPHAGEAL ECHOCARDIOGRAM (TEE) (N/A )     Patient location during evaluation: SICU Anesthesia Type: General Level of consciousness: sedated Pain management: pain level controlled Vital Signs Assessment: post-procedure vital signs reviewed and stable Respiratory status: patient remains intubated per anesthesia plan Cardiovascular status: stable Postop Assessment: no apparent nausea or vomiting Anesthetic complications: no   No complications documented.  Last Vitals:  Vitals:   03/22/21 1545 03/22/21 1600  BP: 107/66 115/70  Pulse: (!) 107 (!) 104  Resp: 18 17  Temp: 37 C 36.7 C  SpO2: 99% 99%    Last Pain:  Vitals:   03/22/21 0853  TempSrc:   PainSc: 0-No pain                 Earl Lites P Latissa Frick

## 2021-03-22 NOTE — Anesthesia Preprocedure Evaluation (Addendum)
Anesthesia Evaluation  Patient identified by MRN, date of birth, ID band Patient awake    Reviewed: Allergy & Precautions, NPO status , Patient's Chart, lab work & pertinent test results  Airway Mallampati: II  TM Distance: >3 FB Neck ROM: Full    Dental  (+) Teeth Intact, Dental Advisory Given   Pulmonary former smoker,    breath sounds clear to auscultation       Cardiovascular + CAD, + Past MI and + Cardiac Stents   Rhythm:Regular Rate:Normal     Neuro/Psych negative neurological ROS  negative psych ROS   GI/Hepatic negative GI ROS, Neg liver ROS,   Endo/Other  negative endocrine ROS  Renal/GU negative Renal ROS     Musculoskeletal   Abdominal Normal abdominal exam  (+)   Peds  Hematology negative hematology ROS (+)   Anesthesia Other Findings   Reproductive/Obstetrics                            Anesthesia Physical Anesthesia Plan  ASA: IV  Anesthesia Plan: General   Post-op Pain Management:    Induction: Intravenous  PONV Risk Score and Plan: Ondansetron and Midazolam  Airway Management Planned: Oral ETT  Additional Equipment: Arterial line, CVP, TEE, Ultrasound Guidance Line Placement and PA Cath  Intra-op Plan:   Post-operative Plan: Post-operative intubation/ventilation  Informed Consent: I have reviewed the patients History and Physical, chart, labs and discussed the procedure including the risks, benefits and alternatives for the proposed anesthesia with the patient or authorized representative who has indicated his/her understanding and acceptance.     Dental advisory given  Plan Discussed with: CRNA  Anesthesia Plan Comments: (Echo:  1. Left ventricular ejection fraction, by estimation, is 20 to 25%. The  left ventricle has severely decreased function. The left ventricle  demonstrates global hypokinesis. The left ventricular internal cavity size  was  severely dilated. There is mild  left ventricular hypertrophy. Left ventricular diastolic parameters are  consistent with Grade II diastolic dysfunction (pseudonormalization).  2. Right ventricular systolic function is normal. The right ventricular  size is mildly enlarged. There is mildly elevated pulmonary artery  systolic pressure.  3. Left atrial size was severely dilated.  4. The mitral valve is normal in structure. Moderate mitral valve  regurgitation. No evidence of mitral stenosis.  5. The aortic valve is normal in structure. Aortic valve regurgitation is  not visualized. No aortic stenosis is present.  6. The inferior vena cava is dilated in size with >50% respiratory  variability, suggesting right atrial pressure of 8 mmHg. )       Anesthesia Quick Evaluation

## 2021-03-22 NOTE — Op Note (Signed)
[address]       [place], [place] [address]             [phone]                                          03/22/2021 Patient:  [name] Pre-Op Dx: Three-vessel coronary artery disease   Severe congestive heart failure with an EF 15 to 20%   Mitral valve regurgitation   Hypertension   Chronic kidney disease   Diabetes mellitus Post-op Dx: Same Procedure: CABG X 3, LIMA to LAD reverse saphenous vein graft to OM1, and PDA Endoscopic greater saphenous vein harvest on the right Left femoral arterial line placement   Surgeon and Role:      * Anjelina Dung, Eliezer Lofts, MD - Primary    *D. Joycelyn Man, PA-C- assisting  Anesthesia  general EBL: 600 ml Blood Administration: None Xclamp Time: 47 min Pump Time: 79 min  Drains: 19 F blake drain:  R, L, mediastinal  Wires: None Counts: correct   Indications: This is a 65 year old gentleman with a history of severe ischemic cardiomyopathy with an EF of 15 to 20%.  He has developed progressive shortness of breath and lower extremity swelling over the past several months.  He presented to the hospital for an elective left heart cath which showed progression of his three-vessel coronary disease.  Previous stents in the right and circumflex systems were completely occluded.  He had collateralization to the lateral and inferior wall from his LAD however he also developed severe stenosis in his LAD.  Echocardiogram demonstrated moderate central mitral valve regurgitation.  He subsequently underwent a cardiac MRI which showed low EF, but viability in his left ventricle.  He was a very high risk patient for surgical revascularization thus femoral arterial line was placed in case we needed mechanical support coming off pump.  Given that his mitral valve regurgitation appeared ischemic I elected to not repair his valve.  Findings: Good conduit.  Good distal targets in the LAD and circumflex.  The PDA was slightly small.  Were able  to achieve good flows on all the vein grafts.  We separated from cardiopulmonary bypass without incident on 2.5 of dobutamine.  After chest closure the patient was on 5 of dobutamine, 4 of Levophed, and 0.04 of vasopressin.  Operative Technique: All invasive lines were placed in pre-op holding.  After the risks, benefits and alternatives were thoroughly discussed, the patient was brought to the operative theatre.  Anesthesia was induced, and the patient was prepped and draped in normal sterile fashion.  An appropriate surgical pause was performed, and pre-operative antibiotics were dosed accordingly.  The left femoral artery was cannulated with a femoral arterial line.  This was performed using Seldinger technique.  We then proceeded with simultaneous incisions along the the right leg for harvesting of the greater saphenous vein and the chest for the sternotomy.  In regards to the sternotomy, this was carried down with bovie cautery, and the sternum was divided with a reciprocating saw.  Meticulous hemostasis was obtained.  The left internal thoracic artery was exposed and harvested in in pedicled fashion.  The patient was systemically heparinized, and the artery was divided distally, and placed in a papaverine sponge.    The sternal elevator was removed, and a retractor was placed.  The pericardium was divided in  the midline and fashioned into a cradle with pericardial stitches.   After we confirmed an appropriate ACT, the ascending aorta was cannulated in standard fashion.  The right atrial appendage was used for venous cannulation site.  Cardiopulmonary bypass was initiated, and the heart retractor was placed. The cross clamp was applied, and a dose of anterograde cardioplegia was given with good arrest of the heart.  We moved to the posterior wall of the heart, and found a good target on the PDA.  An arteriotomy was made, and the vein graft was anastomosed to it in an end to side fashion.  Next we exposed  the lateral wall, and found a good target on the OM1.  An end to side anastomosis with the vein graft was then created.  Finally, we exposed a good target on the LAD, and fashioned an end to side anastomosis between it and the LITA.  We began to re-warm, and a re-animation dose of cardioplegia was given.  The heart was de-aired, and the cross clamp was removed.  Meticulous hemostasis was obtained.    A partial occludding clamp was then placed on the ascending aorta, and we created an end to side anastomosis between it and the proximal vein grafts.  The proximal sites were marked with rings.  Hemostasis was obtained, and we separated from cardiopulmonary bypass without event.the heparin was reversed with protamine.  Chest tubes and wires were placed, and the sternum was re-approximated with with sternal wires.  The soft tissue and skin were re-approximated wth absorbable suture.    The patient tolerated the procedure without any immediate complications, and was transferred to the ICU in guarded condition.  Kinsler Soeder Keane Scrape

## 2021-03-22 NOTE — Transfer of Care (Signed)
Immediate Anesthesia Transfer of Care Note  Patient: Edward [name]  Procedure(s) Performed: CORONARY ARTERY BYPASS GRAFTING (CABG) X  THREE USING LEFT INTERNAL MAMMARY ARTERY AND RIGHT ENDOSCOPIC SAPHENOUS VEIN HARVEST CONDUITS (N/A Chest) TRANSESOPHAGEAL ECHOCARDIOGRAM (TEE) (N/A )  Patient Location: ICU  Anesthesia Type:General  Level of Consciousness: Patient remains intubated per anesthesia plan  Airway & Oxygen Therapy: Patient remains intubated per anesthesia plan and Patient placed on Ventilator (see vital sign flow sheet for setting)  Post-op Assessment: Report given to RN and Post -op Vital signs reviewed and stable  Post vital signs: Reviewed and stable  Last Vitals:  Vitals Value Taken Time  BP 112/50 03/22/21 1445  Temp    Pulse 110 03/22/21 1458  Resp 0 03/22/21 1458  SpO2 98 % 03/22/21 1458  Vitals shown include unvalidated device data.  Last Pain:  Vitals:   03/22/21 0853  TempSrc:   PainSc: 0-No pain      Patients Stated Pain Goal: 3 (03/22/21 0853)  Complications: No complications documented.

## 2021-03-22 NOTE — Anesthesia Procedure Notes (Signed)
Central Venous Catheter Insertion Performed by: Shelton Silvas, MD, anesthesiologist Start/End4/13/2022 9:40 AM, 03/22/2021 9:45 AM Patient location: Pre-op. Preanesthetic checklist: patient identified, IV checked, site marked, risks and benefits discussed, surgical consent, monitors and equipment checked, pre-op evaluation, timeout performed and anesthesia consent Hand hygiene performed  and maximum sterile barriers used  PA cath was placed.Swan type:thermodilution Procedure performed without using ultrasound guided technique. Attempts: 1 Patient tolerated the procedure well with no immediate complications.

## 2021-03-22 NOTE — Procedures (Signed)
Extubation Procedure Note  Patient Details:   Name: Edward [name] DOB: 1956/09/25 MRN: [id number]   Airway Documentation:    Vent end date: 03/22/21 Vent end time: 2003   Evaluation  O2 sats: stable throughout Complications: No apparent complications Patient did tolerate procedure well. Bilateral Breath Sounds: Clear,Diminished   Yes  Augustina Mood 03/22/2021, 8:08 PM   Pt extubated per rapid wean protocol, pt able to perform NIF of -25 and VC of 560 ml. Pt had positive cuff leak and was placed on 4L [place] at this time.

## 2021-03-22 NOTE — Anesthesia Procedure Notes (Signed)
Procedure Name: Intubation Date/Time: 03/22/2021 10:20 AM Performed by: Janace Litten, CRNA Pre-anesthesia Checklist: Patient identified, Emergency Drugs available, Suction available and Patient being monitored Patient Re-evaluated:Patient Re-evaluated prior to induction Oxygen Delivery Method: Circle System Utilized Preoxygenation: Pre-oxygenation with 100% oxygen Induction Type: IV induction Ventilation: Mask ventilation without difficulty Laryngoscope Size: Mac and 4 Grade View: Grade I Tube type: Oral Tube size: 8.0 mm Number of attempts: 1 Airway Equipment and Method: Stylet Placement Confirmation: ETT inserted through vocal cords under direct vision,  positive ETCO2 and breath sounds checked- equal and bilateral Secured at: 22 cm Tube secured with: Tape Dental Injury: Teeth and Oropharynx as per pre-operative assessment

## 2021-03-22 NOTE — Progress Notes (Incomplete)
Post extubation gas within parameters. Pt is only able to reach 250 with the IS, will continue to encourage. Pt sat on side of the bed at 2145. Pain being controlled with morphine.

## 2021-03-22 NOTE — Brief Op Note (Addendum)
03/20/2021 - 03/22/2021  1:04 PM  PATIENT:  Edward [name]  65 y.o. male  PRE-OPERATIVE DIAGNOSIS:  1. ISCHEMIC CARDIOMYOPATHY 2. CORONARY ARTERY DISEASE   POST-OPERATIVE DIAGNOSIS: 1. ISCHEMIC CARDIOMYOPATHY 2. CORONARY ARTERY DISEASE  PROCEDURE:  TRANSESOPHAGEAL ECHOCARDIOGRAM (TEE),  CORONARY ARTERY BYPASS GRAFTING (CABG) X 3 (LIMA to LAD, SVG TO OM2, SVG to PDA)  USING LEFT INTERNAL MAMMARY ARTERY AND RIGHT ENDOSCOPIC SAPHENOUS VEIN HARVEST CONDUITS  EVH HARVEST TIME: 45 minutes; EVH PREP TIME: 14 minutes  SURGEON:  Surgeon(s) and Role:    Lightfoot, Eliezer Lofts, MD - Primary  PHYSICIAN ASSISTANT: Doree Fudge PA-C   ASSISTANTS: Elissa Lovett RNFA   ANESTHESIA:   general  EBL:  Per anesthesia and perfusion record  DRAINS: Chest tubes placed in the mediastinal and pleural spaces   COUNTS CORRECT:  YES  DICTATION: .Dragon Dictation  PLAN OF CARE: Admit to inpatient   PATIENT DISPOSITION:  ICU - intubated and hemodynamically stable.   Delay start of Pharmacological VTE agent (>24hrs) due to surgical blood loss or risk of bleeding: yes  BASELINE WEIGHT: 89.9 kg

## 2021-03-22 NOTE — Anesthesia Procedure Notes (Signed)
Central Venous Catheter Insertion Performed by: Effie Berkshire, MD, anesthesiologist Start/End4/13/2022 9:30 AM, 03/22/2021 9:40 AM Patient location: Pre-op. Preanesthetic checklist: patient identified, IV checked, site marked, risks and benefits discussed, surgical consent, monitors and equipment checked, pre-op evaluation, timeout performed and anesthesia consent Position: Trendelenburg Lidocaine 1% used for infiltration and patient sedated Hand hygiene performed , maximum sterile barriers used  and Seldinger technique used Catheter size: 9 Fr Total catheter length 10. Central line was placed.MAC introducer Swan type:thermodilution PA Cath depth:50 Procedure performed using ultrasound guided technique. Ultrasound Notes:anatomy identified, needle tip was noted to be adjacent to the nerve/plexus identified, no ultrasound evidence of intravascular and/or intraneural injection and image(s) printed for medical record Attempts: 1 Following insertion, line sutured and dressing applied. Post procedure assessment: blood return through all ports, free fluid flow and no air  Patient tolerated the procedure well with no immediate complications.

## 2021-03-22 NOTE — Anesthesia Procedure Notes (Signed)
Arterial Line Insertion Start/End4/13/2022 9:30 AM Performed by: Drema Pry, CRNA  Patient location: Pre-op. Preanesthetic checklist: patient identified, IV checked, risks and benefits discussed, surgical consent, monitors and equipment checked and pre-op evaluation Lidocaine 1% used for infiltration Left, radial was placed Catheter size: 20 G Hand hygiene performed  and maximum sterile barriers used  Allen's test indicative of satisfactory collateral circulation Attempts: 1 Procedure performed without using ultrasound guided technique. Following insertion, dressing applied and Biopatch. Post procedure assessment: normal  Patient tolerated the procedure well with no immediate complications.

## 2021-03-22 NOTE — Progress Notes (Signed)
     [address]       [place] [address]             [phone]       No events.  Vitals:   03/22/21 0025 03/22/21 0344  BP: 110/66 136/88  Pulse: 89 93  Resp: 18 18  Temp: 98.1 F (36.7 C) 98.2 F (36.8 C)  SpO2: 97% 94%   Alert NAD Sinus EWOB  Case discussed with Dr. Shirlee Latch.  Pt does have some viability.  His EF is below 20%.    Will plan for CABG 4.  We will not intervene on mitral.  Likely will need IABP coming off pump.  Edward [name] Keane Scrape

## 2021-03-22 NOTE — Progress Notes (Signed)
Pre extubation blood gas and hemodynamics were within normal parameters. Pt was alert and able to preform NIF and VC exercises. Extubated at 2003.

## 2021-03-22 NOTE — Progress Notes (Signed)
T[facility] BRIEF SICU PROGRESS NOTE  Day of Surgery  S/P Procedure(s) (LRB): CORONARY ARTERY BYPASS GRAFTING (CABG) X  THREE USING LEFT INTERNAL MAMMARY ARTERY AND RIGHT ENDOSCOPIC SAPHENOUS VEIN HARVEST CONDUITS (N/A) TRANSESOPHAGEAL ECHOCARDIOGRAM (TEE) (N/A)   Slow to wake up but starting to wake NSR w/ stable hemodynamics on vasopressin and levophed O2 sats 98-99% Chest tube output low UOP > 100 mL/hr Labs okay  Plan: Continue routine early postop  Purcell Nails, MD 03/22/2021 6:56 PM

## 2021-03-22 NOTE — Discharge Summary (Signed)
Physician Discharge Summary       [address]       Edward [name],[place] [id number]             [phone]    Patient ID: Edward [name] MRN: [id number] DOB/AGE: 1956/02/13 65 y.o.  Admit date: 03/20/2021 Discharge date: 04/01/2021  Admission Diagnoses: 1. Ischemic cardiomyopathy 2. Coronary artery disease  Discharge Diagnoses:  1. S/p CABG x 3 2. Expected post op blood loss anemia 3. History of HCV 4. History of MI 5. History of remote tobacco abuse  Consults: Heart failure  Procedure (s):  CABG X 3, LIMA to LAD reverse saphenous vein graft to OM1, and PDA Endoscopic greater saphenous vein harvest on the right Left femoral arterial line placement by Dr. Cliffton AstersLightfoot on 03/22/2021.  History of Presenting Illness: [name] is a 65 year old male patient with a past medical history significant for a myocardial infarction back in 2004 with 2 drug-eluting stents placed one in the circumflex and one in the mid RCA, and hyperlipidemia.  He is a previous smoker and has since stopped back in 2005.  He originally presented with 2 weeks of worsening shortness of breath while riding his bicycle.  He also reports orthopnea.  He did not have traditional chest tightness or pressure.  He then had an echocardiogram back on March 14 2021 which showed an estimated left ejection fraction of 20 to 25%.  He also had a nuclear stress test in which his estimated ejection fraction was 15% with diffuse hypokinesis.  Due to his past history of coronary artery disease and his low ejection fraction a cardiac catheterization was performed.  This showed severe multivessel CAD with high-grade subtotal stenosis of the LAD, left circumflex, and total occlusion of the previously placed stents in the circumflex and RCA.  There are extensive collaterals supplying the distal RCA and collaterals supplying the obtuse marginal vessel.  The collaterals are jeopardized due to the high-grade subtotal stenosis of the LAD and  circumflex vessel.  The patient has been referred to us for possible surgical revascularization.   The patient lives locally with his wife and leads an active life style. Does not take any prescriptive medications. He was on a statin after his first myocardial infarction but stopped due to intolerance.  Viability study was obtained. Dr. Irving CopasLighfoot discussed the need for coronary artery bypass grafting surgery. Potential risks, benefits, and complications of the surgery were discussed with the patient and he agreed to proceed with surgery. He underwent a CABG x 3 on 03/22/2021.  Brief Hospital Course:  Patient was extubated the evening of surgery without difficulty. He was weaned off Dobutamine, Levophed, and Vasopressin drips. Theone MurdochSwan Ganz, a line, and foley were removed early in his post operative course. Chest tubes remained for a few days and once output was decreased were removed. He was started on Lopressor but this was then stopped. He was volume overloaded and diuresed. He was weaned off the Insulin drip. His pre op HGA1C was 6.4. He likely has pre diabetes and will need further surveillance and follow up with his medical doctor. He had issues with hypotension. As of 04/15, he was on Dobutamine, Milrinone, and Nor epinephrine drips. He had expected post op blood loss anemia. His last H and H was 12.2 and 36.6.  He had thrombocytopenia post op. His platelet count went down to 39,000.  In time, his platelet count did improve. On 04/18, it was up to 86,000. HIT was checked and was  within reference range (negative). His thrombocytopenia did resolve as platelet count on 04/20 was up to 154,000. He had AKI post op. His creatinine went up to 2.46. In time, creatinine continued to improve. His creatinine on 04/20 was down to 1.22. He remained on Dobutamine and Milrinone drips as of 04/18 and was also on oral Digoxin and Losartan. Co ox were checked daily. He was initially on a Lasix drip and this was transitioned to  oral Torsemide. He was switched from Losartan to [facility] on 04/19 and  Milrinone drip was stopped. His volume status improved so Torsemide was stopped on 04/19. Dobutamine drip was decreased to 2 mcg/kg/min on 04/21. His creatinine on this date increased from 1.22 to 1.82. Of note, he was on Entresto bid and had just been started on Spironolactone. Spironolactone was stopped and creatinine on 04/22 was decreased to 1.16. Dobutamine drip was stopped on 04/22. He was felt surgically stable for discharge today.   Latest Vital Signs: Blood pressure 98/70, pulse 86, temperature 98 F (36.7 C), temperature source Oral, resp. rate 19, height 5\' 9"  (1.753 m), weight 89.6 kg, SpO2 94 %.  Physical Exam:  Cardiovascular: RRR Pulmonary: Clear to auscultation bilaterally Abdomen: Soft, non tender, bowel sounds present. Extremities: expected bruising in RLE., no LE edema Wounds: Clean and dry.  No erythema or signs of infection.   Discharge Condition: Stable and discharged to home.  Recent laboratory studies:  Lab Results  Component Value Date   WBC 8.4 04/01/2021   HGB 12.2 (L) 04/01/2021   HCT 36.6 (L) 04/01/2021   MCV 88.8 04/01/2021   PLT 242 04/01/2021   Lab Results  Component Value Date   NA 140 04/01/2021   K 4.2 04/01/2021   CL 109 04/01/2021   CO2 23 04/01/2021   CREATININE 1.28 (H) 04/01/2021   GLUCOSE 123 (H) 04/01/2021      Diagnostic Studies: DG Chest 2 View  Result Date: 03/28/2021 CLINICAL DATA:  Open-heart surgery. EXAM: CHEST - 2 VIEW COMPARISON:  03/27/2021. FINDINGS: PICC line noted stable position. Prior CABG. Cardiomegaly. Persistent low lung volumes with bibasilar and infiltrates/edema. Small left pleural effusion again noted. No interim change. No pneumothorax. IMPRESSION: 1.  PICC line noted stable position. 2.  Prior CABG.  Stable cardiomegaly. 3. Persistent mild bibasilar and infiltrates/edema. Small left pleural effusion again noted. Electronically Signed   By:  Maisie Fus  Register   On: 03/28/2021 07:05   DG Chest 2 View  Result Date: 03/21/2021 CLINICAL DATA:  Coronary artery disease EXAM: CHEST - 2 VIEW COMPARISON:  02/13/2021 FINDINGS: Frontal and lateral views of the chest demonstrate a stable cardiac silhouette. No airspace disease, effusion, or pneumothorax. No acute bony abnormalities. IMPRESSION: 1. No acute intrathoracic process. Electronically Signed   By: Sharlet Salina M.D.   On: 03/21/2021 21:35   CARDIAC CATHETERIZATION  Result Date: 03/20/2021  Ost LAD lesion is 30% stenosed.  Ost Cx to Prox Cx lesion is 60% stenosed.  1st Mrg lesion is 100% stenosed.  Prox Cx to Mid Cx lesion is 95% stenosed.  Mid LAD-1 lesion is 75% stenosed.  Mid LAD-2 lesion is 95% stenosed.  Dist LAD lesion is 65% stenosed.  Prox RCA to Mid RCA lesion is 100% stenosed.  Moderate elevation of right heart pressures with moderate pulmonary hypertension with mean PA pressure at 41 mmHg. Severe multivessel CAD with high-grade subtotal stenoses of the LAD, left circumflex, and total occlusion of the previously placed stents in the circumflex marginal and RCA.  There are extensive collaterals supplying the distal RCA, and collaterals supplying the obtuse marginal vessel.  Collaterals are jeopardized due to the high-grade subtotal stenoses of the LAD and circumflex vessel.  There are excellent distal targets. Findings are compatible with a severe ischemic cardiomyopathy; suspect a significant a amount of hibernating myocardium. RECOMMENDATION: Patient will be heparinized later today following TR band removal.  Surgical consultation for CABG revascularization.   DG Chest Port 1 View  Result Date: 03/27/2021 CLINICAL DATA:  Chest pain, shortness of breath EXAM: PORTABLE CHEST 1 VIEW COMPARISON:  03/22/2021 FINDINGS: Low lung volumes. Persistent pleuroparenchymal density at the left lung base. No pneumothorax. Similar cardiomediastinal contours. IMPRESSION: Persistent left basilar  atelectasis/consolidation and probable small pleural effusion. Aeration probably slightly improved. Electronically Signed   By: Guadlupe Spanish M.D.   On: 03/27/2021 08:09   DG Chest Port 1 View  Result Date: 03/26/2021 CLINICAL DATA:  Pleural effusion EXAM: PORTABLE CHEST 1 VIEW COMPARISON:  Chest x-ray dated 03/24/2021. FINDINGS: Stable hazy opacity at the LEFT lung base, compatible with atelectasis and/or small pleural effusion. Improved aeration at the RIGHT lung base, compatible with resolving atelectasis. No new lung findings. No pneumothorax is seen. Heart size and mediastinal contours appear stable. Swan-Ganz catheter is stable in position with tip just to the RIGHT of midline. IMPRESSION: 1. Stable hazy opacity at the LEFT lung base, compatible with atelectasis and/or small pleural effusion. 2. Improved aeration at the RIGHT lung base, compatible with resolving atelectasis. Electronically Signed   By: Bary Richard M.D.   On: 03/26/2021 08:32   DG Chest Port 1 View  Result Date: 03/24/2021 CLINICAL DATA:  Status post cardiac surgery.  Follow-up exam. EXAM: PORTABLE CHEST 1 VIEW COMPARISON:  03/23/2021 and older studies. FINDINGS: Bibasilar lung opacities are noted consistent with atelectasis, similar to the previous day's study and accentuated by low lung volumes. No evidence of pulmonary edema.  No pneumothorax. No mediastinal widening. Right internal jugular Swan-Ganz catheter tip projects in the right pulmonary artery. IMPRESSION: 1. Persistent lung base opacity consistent with atelectasis accentuated by low lung volumes. 2. No pulmonary edema, pneumothorax or mediastinal widening. Well-positioned Swan-Ganz catheter. Electronically Signed   By: Amie Portland M.D.   On: 03/24/2021 08:03   DG Chest Port 1 View  Result Date: 03/23/2021 CLINICAL DATA:  Respiratory distress EXAM: PORTABLE CHEST 1 VIEW COMPARISON:  03/22/2021 FINDINGS: Interval extubation. Lung volumes are extremely small and  pulmonary insufflation has diminished since prior examination. Increasing bibasilar atelectasis noted. No pneumothorax or pleural effusion. Right internal jugular Swan-Ganz catheter is again seen with its tip within the right pulmonary artery. Mediastinal drain unchanged. Coronary artery bypass grafting has been performed. Cardiac size is at the upper limits of normal. IMPRESSION: Interval extubation with decreasing pulmonary insufflation. Lung volumes are extremely small. Electronically Signed   By: Helyn Numbers MD   On: 03/23/2021 06:40   DG Chest Port 1 View  Result Date: 03/22/2021 CLINICAL DATA:  Postop CABG, intubated EXAM: PORTABLE CHEST 1 VIEW COMPARISON:  03/21/2021 FINDINGS: Single frontal view of the chest demonstrates postsurgical changes from median sternotomy and bypass surgery. Endotracheal tube overlies tracheal air column tip just below thoracic inlet. Enteric catheter passes below diaphragm, tip excluded by collimation but side port projecting just below gastroesophageal junction. Right internal jugular flow directed catheter tip overlies the main pulmonary outflow tract. Mediastinal drains are noted. Cardiac silhouette remains mildly enlarged. There are bibasilar areas of consolidation most consistent with atelectasis. Small left pleural effusion.  Trace right apical pneumothorax, with pleural separation measuring approximately 3 mm. No left-sided pneumothorax. No acute bony abnormalities. IMPRESSION: 1. Trace right apical pneumothorax, volume estimated far less than 5%. Pleural separation measuring 3 mm. 2. Bibasilar hypoventilatory changes, with small left pleural effusion. 3. Support devices as above. Critical Value/emergent results were called by telephone at the time of interpretation on 03/22/2021 at 3:30 pm to provider [facility] , who verbally acknowledged these results. Electronically Signed   By: Sharlet Salina M.D.   On: 03/22/2021 16:00   MR CARDIAC MORPHOLOGY W WO  CONTRAST  Result Date: 03/22/2021 CLINICAL DATA:  Ischemic cardiomyopathy, assess viability. EXAM: CARDIAC MRI TECHNIQUE: The patient was scanned on a 1.5 Tesla GE magnet. A dedicated cardiac coil was used. Functional imaging was done using Fiesta sequences. 2,3, and 4 chamber views were done to assess for RWMA's. Modified Simpson's rule using a short axis stack was used to calculate an ejection fraction on a dedicated work [employer]. The patient received 9 cc of Gadavist. After 10 minutes inversion recovery sequences were used to assess for infiltration and scar tissue. CONTRAST:  Gadavist 9 cc FINDINGS: Small left pleural effusion.  Trivial pericardial effusion. Moderately dilated left ventricle with normal wall thickness. There was global hypokinesis with akinesis of the inferolateral and anterolateral walls, EF 15%. Normal right ventricular size with moderate systolic dysfunction, EF 31%. Mild left atrial enlargement. Normal right atrium. Trileaflet aortic valve with no significant stenosis or regurgitation. There was at least mild mitral regurgitation (flow sequences to quantify were not done). Delayed enhancement imaging: 25-49% wall thickness subendocardial late gadolinium enhancement (LGE) in the entire inferolateral and anterolateral walls. <25% wall thickness subendocardial LGE in the mid to apical anterior wall. Measurements: LVEDV 295 mL LVSV 44 mL LVEF 15% RVEDV 132 mL RVSV 41 mL RVEF 31% IMPRESSION: 1. Moderately dilated LV with EF 15%, global hypokinesis with lateral akinesis. 2.  Normal RV size with EF 31% 3. LGE pattern suggests substantial viability (LGE noted primarily in lateral wall, <50% wall thickness) Dalton Mclean Electronically Signed   By: Marca Ancona M.D.   On: 03/22/2021 14:03   ECHOCARDIOGRAM COMPLETE  Result Date: 03/14/2021    ECHOCARDIOGRAM REPORT   Patient Name:   TYTON ABDALLAH  Date of Exam: 03/14/2021 Medical Rec #:  [id number]     Height:       69.0 in  Accession #:    [id number]    Weight:       207.4 lb Date of Birth:  02-Jun-1956    BSA:          2.098 m Patient Age:    64 years      BP:           130/68 mmHg Patient Gender: M             HR:           81 bpm. Exam Location:  [address] Procedure: 2D Echo, Cardiac Doppler, Color Doppler and Intracardiac            Opacification Agent Indications:    I25.10 Coronary artery disease.  History:        Patient has prior history of Echocardiogram examinations, most                 recent 08/22/2010. STENTS. STEMI. Coronary artery disease.  Sonographer:    Daphine Deutscher RDCS Referring Phys: [id number] Sharrell Ku BHAGAT IMPRESSIONS  1. Left ventricular ejection fraction, by  estimation, is 20 to 25%. The left ventricle has severely decreased function. The left ventricle demonstrates global hypokinesis. The left ventricular internal cavity size was severely dilated. There is mild left ventricular hypertrophy. Left ventricular diastolic parameters are consistent with Grade II diastolic dysfunction (pseudonormalization).  2. Right ventricular systolic function is normal. The right ventricular size is mildly enlarged. There is mildly elevated pulmonary artery systolic pressure.  3. Left atrial size was severely dilated.  4. The mitral valve is normal in structure. Moderate mitral valve regurgitation. No evidence of mitral stenosis.  5. The aortic valve is normal in structure. Aortic valve regurgitation is not visualized. No aortic stenosis is present.  6. The inferior vena cava is dilated in size with >50% respiratory variability, suggesting right atrial pressure of 8 mmHg. FINDINGS  Left Ventricle: Left ventricular ejection fraction, by estimation, is 20 to 25%. The left ventricle has severely decreased function. The left ventricle demonstrates global hypokinesis. Definity contrast agent was given IV to delineate the left ventricular endocardial borders. The left ventricular internal cavity size was severely  dilated. There is mild left ventricular hypertrophy. Left ventricular diastolic parameters are consistent with Grade II diastolic dysfunction (pseudonormalization). Right Ventricle: The right ventricular size is mildly enlarged. No increase in right ventricular wall thickness. Right ventricular systolic function is normal. There is mildly elevated pulmonary artery systolic pressure. The tricuspid regurgitant velocity is 2.91 m/s, and with an assumed right atrial pressure of 8 mmHg, the estimated right ventricular systolic pressure is 41.9 mmHg. Left Atrium: Left atrial size was severely dilated. Right Atrium: Right atrial size was normal in size. Pericardium: There is no evidence of pericardial effusion. Mitral Valve: The mitral valve is normal in structure. Moderate mitral valve regurgitation. No evidence of mitral valve stenosis. Tricuspid Valve: The tricuspid valve is normal in structure. Tricuspid valve regurgitation is mild . No evidence of tricuspid stenosis. Aortic Valve: The aortic valve is normal in structure. Aortic valve regurgitation is not visualized. No aortic stenosis is present. Pulmonic Valve: The pulmonic valve was normal in structure. Pulmonic valve regurgitation is trivial. No evidence of pulmonic stenosis. Aorta: The aortic root is normal in size and structure. Venous: The inferior vena cava is dilated in size with greater than 50% respiratory variability, suggesting right atrial pressure of 8 mmHg. IAS/Shunts: No atrial level shunt detected by color flow Doppler.  LEFT VENTRICLE PLAX 2D LVIDd:         6.30 cm  Diastology LVIDs:         5.80 cm  LV e' medial:    3.59 cm/s LV PW:         1.30 cm  LV E/e' medial:  21.3 LV IVS:        0.90 cm  LV e' lateral:   5.26 cm/s LVOT diam:     2.20 cm  LV E/e' lateral: 14.6 LV SV:         31 LV SV Index:   15 LVOT Area:     3.80 cm  RIGHT VENTRICLE            IVC RV Basal diam:  4.00 cm    IVC diam: 2.10 cm RV S prime:     6.09 cm/s TAPSE (M-mode): 1.3 cm  LEFT ATRIUM             Index       RIGHT ATRIUM           Index LA diam:  4.80 cm 2.29 cm/m  RA Area:     16.40 cm LA Vol (A2C):   56.6 ml 26.98 ml/m RA Volume:   48.00 ml  22.88 ml/m LA Vol (A4C):   66.5 ml 31.69 ml/m LA Biplane Vol: 64.4 ml 30.69 ml/m  AORTIC VALVE LVOT Vmax:   56.70 cm/s LVOT Vmean:  41.400 cm/s LVOT VTI:    0.083 m  AORTA Ao Root diam: 3.00 cm Ao Asc diam:  3.40 cm MITRAL VALVE               TRICUSPID VALVE MV Area (PHT): 6.32 cm    TR Peak grad:   33.9 mmHg MV Decel Time: 120 msec    TR Vmax:        291.00 cm/s MR Peak grad: 68.9 mmHg MR Mean grad: 45.3 mmHg    SHUNTS MR Vmax:      415.00 cm/s  Systemic VTI:  0.08 m MR Vmean:     313.0 cm/s   Systemic Diam: 2.20 cm MV E velocity: 76.60 cm/s MV A velocity: 31.20 cm/s MV E/A ratio:  2.46 Donato Schultz MD Electronically signed by Donato Schultz MD Signature Date/Time: 03/14/2021/11:13:11 AM    Final    ECHO INTRAOPERATIVE TEE  Result Date: 03/23/2021  *INTRAOPERATIVE TRANSESOPHAGEAL REPORT *  Patient Name:   HOLMES PILOTTI   Date of Exam: 03/22/2021 Medical Rec #:  [id number]      Height:       69.0 in Accession #:    [id number]     Weight:       198.1 lb Date of Birth:  02/01/1956     BSA:          2.06 m Patient Age:    64 years       BP:           136/88 mmHg Patient Gender: M              HR:           93 bpm. Exam Location:  Anesthesiology Transesophogeal exam was perform intraoperatively during surgical procedure. Patient was closely monitored under general anesthesia during the entirety of examination. Indications:     CAD Sonographer:     Lavenia Atlas RDCS Performing Phys: Shona Simpson MD Diagnosing Phys: Shona Simpson MD Complications: No known complications during this procedure. POST-OP IMPRESSIONS - Left Ventricle: LVEF slightly improved, CO > 5L/min, diffuse hypokinesis remains with slight improvement of anterior and apical wall segments. - Right Ventricle: The right ventricle appears unchanged from pre-bypass. - Aorta:  The aorta appears unchanged from pre-bypass, no dissection noted after cannula removed. - Left Atrium: The left atrium appears unchanged from pre-bypass. - Aortic Valve: The aortic valve appears unchanged from pre-bypass. - Mitral Valve: The mitral valve appears unchanged from pre-bypass. - Tricuspid Valve: The tricuspid valve appears unchanged from pre-bypass. - Pulmonic Valve: The pulmonic valve appears unchanged from pre-bypass. - Interventricular Septum: The interventricular septum appears unchanged from pre-bypass. - Pericardium: The pericardium appears unchanged from pre-bypass. PRE-OP FINDINGS  Left Ventricle: The left ventricle has severely reduced systolic function, with an ejection fraction of 20-30% 20%. The cavity size was severely dilated. The left ventricular wall thickness was not assessed. Findings are consistent with ischemic cardiomyopathy. Left ventricular diffuse hypokinesis. Right Ventricle: The right ventricle has mildly reduced systolic function. The cavity was dialated. There is no increase in right ventricular wall thickness. Right ventricular systolic pressure could not be assessed. There is no aneurysm  seen. PA catheter traversing the RV into the R PA. Left Atrium: Left atrial size was dilated. No left atrial/left atrial appendage thrombus was detected. The left atrial appendage is well visualized and there is no evidence of thrombus present. Left atrial appendage velocity is normal at greater than 40 cm/s. Right Atrium: Right atrial size was normal in size. PA catheter traversing the RA into the RV. Interatrial Septum: No atrial level shunt detected by color flow Doppler. There is no evidence of a patent foramen ovale. Pericardium: There is no evidence of pericardial effusion. There is no pleural effusion. Mitral Valve: The mitral valve is normal in structure. Mitral valve regurgitation is moderate by color flow Doppler. The MR jet is centrally-directed. There is no evidence of mitral  valve vegetation. There is No evidence of mitral stenosis. Tricuspid Valve: The tricuspid valve was normal in structure. Tricuspid valve regurgitation trivial to mild. The jet is directed centrally. No evidence of tricuspid stenosis is present. There is no evidence of tricuspid valve vegetation. Aortic Valve: The aortic valve is tricuspid aortic valve regurgitation was not visualized by color flow Doppler. There is no stenosis of the aortic valve. There is no evidence of aortic valve vegetation. Pulmonic Valve: The pulmonic valve was normal in structure. Pulmonic valve regurgitation is not visualized by color flow Doppler. Aorta: The aortic root and ascending aorta are normal in size and structure. The aortic arch was not well visualized. The descending aorta was not well visualized. Pulmonary Artery: Theone Murdoch catheter present on the right. The pulmonary artery is mildly dilated. Pulmonary hypertension is moderate. Venous: The inferior vena cava was not well visualized. Shunts: There is no evidence of an atrial septal defect. +--------------+-------++ LEFT VENTRICLE        +--------------+-------++ PLAX 2D               +--------------+-------++ LVIDd:        7.30 cm +--------------+-------++ LVIDs:        6.60 cm +--------------+-------++ LV SV:        57 ml   +--------------+-------++ LV SV Index:  27.03   +--------------+-------++                       +--------------+-------++ +-------------+-----------++ AORTIC VALVE             +-------------+-----------++ AV Vmax:     79.35 cm/s  +-------------+-----------++ AV Vmean:    51.900 cm/s +-------------+-----------++ AV VTI:      0.112 m     +-------------+-----------++ AV Peak Grad:2.5 mmHg    +-------------+-----------++ AV Mean Grad:1.5 mmHg    +-------------+-----------++  Shona Simpson MD Electronically signed by Shona Simpson MD Signature Date/Time: 03/23/2021/7:59:26 PM    Final    VAS US DOPPLER PRE  CABG  Result Date: 03/20/2021 PREOPERATIVE VASCULAR EVALUATION  Indications:      Pre-CABG. Risk Factors:     Coronary artery disease. Comparison Study: NO PRIOR Performing Technologist: Blanch Media RVS  Examination Guidelines: A complete evaluation includes B-mode imaging, spectral Doppler, color Doppler, and power Doppler as needed of all accessible portions of each vessel. Bilateral testing is considered an integral part of a complete examination. Limited examinations for reoccurring indications may be performed as noted.  Right Carotid Findings: +----------+--------+--------+--------+------------+--------+           PSV cm/sEDV cm/sStenosisDescribe    Comments +----------+--------+--------+--------+------------+--------+ CCA Prox  66      22  heterogenous         +----------+--------+--------+--------+------------+--------+ CCA Distal51      16              heterogenous         +----------+--------+--------+--------+------------+--------+ ICA Prox  70      27      1-39%   heterogenous         +----------+--------+--------+--------+------------+--------+ ICA Distal75      28                                   +----------+--------+--------+--------+------------+--------+ ECA       67      11                                   +----------+--------+--------+--------+------------+--------+ Portions of this table do not appear on this page. +----------+--------+-------+--------+------------+           PSV cm/sEDV cmsDescribeArm Pressure +----------+--------+-------+--------+------------+ Subclavian112                                 +----------+--------+-------+--------+------------+ +---------+--------+--+--------+--+---------+ VertebralPSV cm/s36EDV cm/s11Antegrade +---------+--------+--+--------+--+---------+ Left Carotid Findings: +----------+--------+--------+--------+------------+--------+           PSV cm/sEDV cm/sStenosisDescribe     Comments +----------+--------+--------+--------+------------+--------+ CCA Prox  107     32              heterogenous         +----------+--------+--------+--------+------------+--------+ CCA Distal43      11              heterogenous         +----------+--------+--------+--------+------------+--------+ ICA Prox  53      23      1-39%   heterogenous         +----------+--------+--------+--------+------------+--------+ ICA Distal88      35                                   +----------+--------+--------+--------+------------+--------+ ECA       78                                           +----------+--------+--------+--------+------------+--------+ +----------+--------+--------+--------+------------+ SubclavianPSV cm/sEDV cm/sDescribeArm Pressure +----------+--------+--------+--------+------------+           77                                   +----------+--------+--------+--------+------------+ +---------+--------+--+--------+--+---------+ VertebralPSV cm/s35EDV cm/s11Antegrade +---------+--------+--+--------+--+---------+  ABI Findings: +--------+------------------+-----+---------+--------+ Right   Rt Pressure (mmHg)IndexWaveform Comment  +--------+------------------+-----+---------+--------+ [id number]                    triphasic         +--------+------------------+-----+---------+--------+ PTA     141               0.97 triphasic         +--------+------------------+-----+---------+--------+ DP      135               0.93 triphasic         +--------+------------------+-----+---------+--------+ +--------+------------------+-----+---------+-------+ Left  Lt Pressure (mmHg)IndexWaveform Comment +--------+------------------+-----+---------+-------+ [id number]                    triphasic        +--------+------------------+-----+---------+-------+ PTA     155               1.07 triphasic         +--------+------------------+-----+---------+-------+ DP      150               1.03 triphasic        +--------+------------------+-----+---------+-------+ +-------+---------------+----------------+ ABI/TBIToday's ABI/TBIPrevious ABI/TBI +-------+---------------+----------------+ Right  0.97                            +-------+---------------+----------------+ Left   1.07                            +-------+---------------+----------------+  Right Doppler Findings: +--------+--------+-----+---------+--------+ Site    PressureIndexDoppler  Comments +--------+--------+-----+---------+--------+ [id number]          triphasic         +--------+--------+-----+---------+--------+ Radial               triphasic         +--------+--------+-----+---------+--------+ Ulnar                triphasic         +--------+--------+-----+---------+--------+  Left Doppler Findings: +--------+--------+-----+---------+--------+ Site    PressureIndexDoppler  Comments +--------+--------+-----+---------+--------+ [id number]          triphasic         +--------+--------+-----+---------+--------+ Radial               triphasic         +--------+--------+-----+---------+--------+ Ulnar                triphasic         +--------+--------+-----+---------+--------+  Summary: Right Carotid: Velocities in the right ICA are consistent with a 1-39% stenosis. Left Carotid: Velocities in the left ICA are consistent with a 1-39% stenosis. Vertebrals: Bilateral vertebral arteries demonstrate antegrade flow. Right ABI: Resting right ankle-brachial index is within normal range. No evidence of significant right lower extremity arterial disease. Left ABI: Resting left ankle-brachial index is within normal range. No evidence of significant left lower extremity arterial disease. Right Upper Extremity: Doppler waveforms remain within normal limits with right radial compression. Doppler waveforms  remain within normal limits with right ulnar compression. Left Upper Extremity: Doppler waveforms remain within normal limits with left radial compression. Doppler waveforms decrease 50% with left ulnar compression.  Electronically signed by Sherald Hess MD on 03/20/2021 at 4:30:45 PM.    Final    [place] EKG SITE RITE  Result Date: 03/26/2021 If Site Rite image not attached, placement could not be confirmed due to current cardiac rhythm.  Discharge Instructions    Amb Referral to Cardiac Rehabilitation   Complete by: As directed    Diagnosis: CABG   CABG X ___: 3   After initial evaluation and assessments completed: Virtual Based Care may be provided alone or in conjunction with Phase 2 Cardiac Rehab based on patient barriers.: Yes      Discharge Medications: Allergies as of 04/01/2021   No Known Allergies     Medication List    STOP taking these medications   furosemide 20 MG tablet Commonly known as: LASIX     TAKE these medications   aspirin 325 MG  EC tablet Take 1 tablet (325 mg total) by mouth daily. What changed:   medication strength  how much to take  additional instructions   atorvastatin 80 MG tablet Commonly known as: LIPITOR Take 1 tablet (80 mg total) by mouth daily. Start taking on: April 02, 2021   digoxin 0.125 MG tablet Commonly known as: LANOXIN Take 1 tablet (0.125 mg total) by mouth daily. Start taking on: April 02, 2021   loratadine 10 MG tablet Commonly known as: CLARITIN Take 10 mg by mouth daily as needed for allergies.   multivitamin with minerals Tabs tablet Take 1 tablet by mouth daily.   oxyCODONE 5 MG immediate release tablet Commonly known as: Oxy IR/ROXICODONE Take 1 tablet (5 mg total) by mouth every 4 (four) hours as needed for up to 7 days for severe pain.   sacubitril-valsartan 24-26 MG Commonly known as: ENTRESTO Take 1 tablet by mouth 2 (two) times daily.      The patient has been discharged on:   1.Beta Blocker:   Yes [   ]                              No   [  x ]                              If No, reason:Low LVEF  2.Ace Inhibitor/ARB: Yes [ x  ]                                     No  [    ]                                     If No, reason:  3.Statin:   Yes [x   ]                  No  [   ]                  If No, reason:  4.Ecasa:  Yes  [ x  ]                  No   [   ]                  If No, reason:  Follow Up Appointments:  Follow-up Information    [name], MD. Go on 04/14/2021.   Specialty: Cardiothoracic Surgery Why: Appointment time is at 9:50 am Contact information: [address] [place] [address] [phone]        Joaquim Nam, MD. Call.   Specialty: Family Medicine Why: for a follow up appointment regarding further surveillance of HGA1C 6.4 (pre diabetes). Contact information: [address] [place] [address] [phone]        Laurann Montana, PA-C. Go on 04/18/2021.   Specialties: Cardiology, Radiology Why: Appointment time is at 2:15 pm Contact information: [address] [place] [address] [phone]        [facility] Follow up on 04/07/2021.   Specialty: Cardiology Why: at 1130. Located on the 1st floor of  [facility]. Free Valet parking.  Contact information: [address] [id number] mc [place] [address] [phone]              Signed: Dale Garrett 04/01/2021, 11:43 AM

## 2021-03-23 ENCOUNTER — Other Ambulatory Visit: Payer: Self-pay

## 2021-03-23 ENCOUNTER — Encounter (HOSPITAL_COMMUNITY): Payer: Self-pay | Admitting: Thoracic Surgery (Cardiothoracic Vascular Surgery)

## 2021-03-23 ENCOUNTER — Inpatient Hospital Stay (HOSPITAL_COMMUNITY): Payer: Medicare Other

## 2021-03-23 DIAGNOSIS — R570 Cardiogenic shock: Secondary | ICD-10-CM

## 2021-03-23 LAB — BASIC METABOLIC PANEL
Anion gap: 10 (ref 5–15)
Anion gap: 14 (ref 5–15)
Anion gap: 12 (ref 5–15)
BUN: 16 mg/dL (ref 8–23)
BUN: 21 mg/dL (ref 8–23)
BUN: 22 mg/dL (ref 8–23)
CO2: 20 mmol/L — ABNORMAL LOW (ref 22–32)
CO2: 13 mmol/L — ABNORMAL LOW (ref 22–32)
CO2: 15 mmol/L — ABNORMAL LOW (ref 22–32)
Calcium: 8.6 mg/dL — ABNORMAL LOW (ref 8.9–10.3)
Calcium: 8.2 mg/dL — ABNORMAL LOW (ref 8.9–10.3)
Calcium: 8.1 mg/dL — ABNORMAL LOW (ref 8.9–10.3)
Chloride: 107 mmol/L (ref 98–111)
Chloride: 103 mmol/L (ref 98–111)
Chloride: 103 mmol/L (ref 98–111)
Creatinine, Ser: 1.62 mg/dL — ABNORMAL HIGH (ref 0.61–1.24)
Creatinine, Ser: 2.46 mg/dL — ABNORMAL HIGH (ref 0.61–1.24)
Creatinine, Ser: 2.38 mg/dL — ABNORMAL HIGH (ref 0.61–1.24)
GFR, Estimated: 47 mL/min — ABNORMAL LOW (ref >60)
GFR, Estimated: 28 mL/min — ABNORMAL LOW (ref >60)
GFR, Estimated: 30 mL/min — ABNORMAL LOW (ref >60)
Glucose, Bld: 206 mg/dL — ABNORMAL HIGH (ref 70–99)
Glucose, Bld: 235 mg/dL — ABNORMAL HIGH (ref 70–99)
Glucose, Bld: 219 mg/dL — ABNORMAL HIGH (ref 70–99)
Potassium: 4.5 mmol/L (ref 3.5–5.1)
Potassium: 6.6 mmol/L (ref 3.5–5.1)
Potassium: 5.8 mmol/L — ABNORMAL HIGH (ref 3.5–5.1)
Sodium: 137 mmol/L (ref 135–145)
Sodium: 130 mmol/L — ABNORMAL LOW (ref 135–145)
Sodium: 130 mmol/L — ABNORMAL LOW (ref 135–145)

## 2021-03-23 LAB — ECHO INTRAOPERATIVE TEE
AV Mean grad: 1.5 mmHg
AV Peak grad: 2.5 mmHg
Ao pk vel: 0.79 m/s
Height: 69 in
MV Vena cont: 0.9 cm
S' Lateral: 6.6 cm
Weight: 3169.6 oz

## 2021-03-23 LAB — GLUCOSE, CAPILLARY
Glucose-Capillary: 174 mg/dL — ABNORMAL HIGH (ref 70–99)
Glucose-Capillary: 174 mg/dL — ABNORMAL HIGH (ref 70–99)
Glucose-Capillary: 177 mg/dL — ABNORMAL HIGH (ref 70–99)
Glucose-Capillary: 178 mg/dL — ABNORMAL HIGH (ref 70–99)
Glucose-Capillary: 179 mg/dL — ABNORMAL HIGH (ref 70–99)
Glucose-Capillary: 180 mg/dL — ABNORMAL HIGH (ref 70–99)
Glucose-Capillary: 188 mg/dL — ABNORMAL HIGH (ref 70–99)
Glucose-Capillary: 191 mg/dL — ABNORMAL HIGH (ref 70–99)
Glucose-Capillary: 192 mg/dL — ABNORMAL HIGH (ref 70–99)
Glucose-Capillary: 205 mg/dL — ABNORMAL HIGH (ref 70–99)
Glucose-Capillary: 205 mg/dL — ABNORMAL HIGH (ref 70–99)
Glucose-Capillary: 231 mg/dL — ABNORMAL HIGH (ref 70–99)
Glucose-Capillary: 88 mg/dL (ref 70–99)

## 2021-03-23 LAB — CBC
HCT: 32.9 % — ABNORMAL LOW (ref 39.0–52.0)
HCT: 38 % — ABNORMAL LOW (ref 39.0–52.0)
Hemoglobin: 11 g/dL — ABNORMAL LOW (ref 13.0–17.0)
Hemoglobin: 12.4 g/dL — ABNORMAL LOW (ref 13.0–17.0)
MCH: 30.2 pg (ref 26.0–34.0)
MCH: 30 pg (ref 26.0–34.0)
MCHC: 33.4 g/dL (ref 30.0–36.0)
MCHC: 32.6 g/dL (ref 30.0–36.0)
MCV: 90.4 fL (ref 80.0–100.0)
MCV: 91.8 fL (ref 80.0–100.0)
Platelets: 63 10*3/uL — ABNORMAL LOW (ref 150–400)
Platelets: 61 10*3/uL — ABNORMAL LOW (ref 150–400)
RBC: 3.64 MIL/uL — ABNORMAL LOW (ref 4.22–5.81)
RBC: 4.14 MIL/uL — ABNORMAL LOW (ref 4.22–5.81)
RDW: 12.5 % (ref 11.5–15.5)
RDW: 12.8 % (ref 11.5–15.5)
WBC: 8.7 10*3/uL (ref 4.0–10.5)
WBC: 14.1 10*3/uL — ABNORMAL HIGH (ref 4.0–10.5)
nRBC: 0 % (ref 0.0–0.2)
nRBC: 0 % (ref 0.0–0.2)

## 2021-03-23 LAB — MAGNESIUM
Magnesium: 2.1 mg/dL (ref 1.7–2.4)
Magnesium: 2.4 mg/dL (ref 1.7–2.4)

## 2021-03-23 LAB — COOXEMETRY PANEL
Carboxyhemoglobin: 0.8 % (ref 0.5–1.5)
Methemoglobin: 1.1 % (ref 0.0–1.5)
O2 Saturation: 41.7 %
Total hemoglobin: 11.5 g/dL — ABNORMAL LOW (ref 12.0–16.0)

## 2021-03-23 MED ORDER — FUROSEMIDE 10 MG/ML IJ SOLN: 40.0000 mg | Freq: Two times a day (BID) | INTRAMUSCULAR | Status: DC

## 2021-03-23 MED ORDER — METHOCARBAMOL 500 MG PO TABS
500.0000 mg | ORAL_TABLET | Freq: Three times a day (TID) | ORAL | Status: AC
  Administered 2021-03-23 – 2021-03-25 (×9): 500 mg via ORAL
  Filled 2021-03-23 (×9): qty 1

## 2021-03-23 MED ORDER — INSULIN ASPART 100 UNIT/ML ~~LOC~~ SOLN
0.0000 [IU] | SUBCUTANEOUS | Status: DC
  Administered 2021-03-23: 4 [IU] via SUBCUTANEOUS
  Administered 2021-03-23: 8 [IU] via SUBCUTANEOUS

## 2021-03-23 MED ORDER — MILRINONE LACTATE IN DEXTROSE 20-5 MG/100ML-% IV SOLN
0.1250 ug/kg/min | INTRAVENOUS | Status: DC
  Administered 2021-03-23 – 2021-03-26 (×7): 0.25 ug/kg/min via INTRAVENOUS
  Administered 2021-03-27 (×2): 0.125 ug/kg/min via INTRAVENOUS
  Filled 2021-03-23 (×8): qty 100

## 2021-03-23 MED ORDER — ALBUMIN HUMAN 5 % IV SOLN
12.5000 g | Freq: Once | INTRAVENOUS | Status: AC
  Administered 2021-03-23: 12.5 g via INTRAVENOUS
  Filled 2021-03-23: qty 250

## 2021-03-23 MED ORDER — FUROSEMIDE 10 MG/ML IJ SOLN
40.0000 mg | Freq: Once | INTRAMUSCULAR | Status: AC
  Administered 2021-03-23: 40 mg via INTRAVENOUS
  Filled 2021-03-23: qty 4

## 2021-03-23 MED ORDER — LIDOCAINE 5 % EX PTCH
2.0000 | MEDICATED_PATCH | CUTANEOUS | Status: DC
  Administered 2021-03-23 – 2021-04-01 (×8): 2 via TRANSDERMAL
  Filled 2021-03-23 (×10): qty 2

## 2021-03-23 MED ORDER — SODIUM BICARBONATE 8.4 % IV SOLN
50.0000 meq | Freq: Once | INTRAVENOUS | Status: AC
  Administered 2021-03-23: 50 meq via INTRAVENOUS
  Filled 2021-03-23: qty 50

## 2021-03-23 MED ORDER — DIGOXIN 125 MCG PO TABS
0.1250 mg | ORAL_TABLET | Freq: Every day | ORAL | Status: DC
  Administered 2021-03-23: 0.125 mg via ORAL
  Filled 2021-03-23: qty 1

## 2021-03-23 MED ORDER — FUROSEMIDE 10 MG/ML IJ SOLN: 40.0000 mg | Freq: Once | INTRAMUSCULAR | Status: DC

## 2021-03-23 MED ORDER — ENOXAPARIN SODIUM 40 MG/0.4ML ~~LOC~~ SOLN
40.0000 mg | Freq: Every day | SUBCUTANEOUS | Status: DC
  Administered 2021-03-23 – 2021-03-24 (×2): 40 mg via SUBCUTANEOUS
  Filled 2021-03-23 (×2): qty 0.4

## 2021-03-23 MED FILL — Potassium Chloride Inj 2 mEq/ML: INTRAVENOUS | Qty: 40 | Status: AC

## 2021-03-23 MED FILL — Heparin Sodium (Porcine) Inj 1000 Unit/ML: INTRAMUSCULAR | Qty: 2500 | Status: AC

## 2021-03-23 MED FILL — Lidocaine HCl Local Preservative Free (PF) Inj 2%: INTRAMUSCULAR | Qty: 15 | Status: AC

## 2021-03-23 MED FILL — Heparin Sodium (Porcine) Inj 1000 Unit/ML: INTRAMUSCULAR | Qty: 30 | Status: AC

## 2021-03-23 MED FILL — Sodium Chloride IV Soln 0.9%: INTRAVENOUS | Qty: 250 | Status: CN

## 2021-03-23 MED FILL — Phenylephrine HCl IV Soln 10 MG/ML: INTRAVENOUS | Qty: 2 | Status: CN

## 2021-03-23 NOTE — Progress Notes (Signed)
      [address]       [place] [address]             [phone]                 1 Day Post-Op Procedure(s) (LRB): CORONARY ARTERY BYPASS GRAFTING (CABG) X  THREE USING LEFT INTERNAL MAMMARY ARTERY AND RIGHT ENDOSCOPIC SAPHENOUS VEIN HARVEST CONDUITS (N/A) TRANSESOPHAGEAL ECHOCARDIOGRAM (TEE) (N/A)   Events: No events overnight Extubated  _______________________________________________________________ Vitals: BP 131/76   Pulse 97   Temp 98.78 F (37.1 C)   Resp 19   Ht 5\' 9"  (1.753 m)   Wt 95.9 kg   SpO2 99%   BMI 31.22 kg/m   - Neuro: alert NAD  - Cardiovascular: sinus  Drips: dob @ 2.5   PAP: (31-60)/(10-40) 42/17 CVP:  [1 mmHg-31 mmHg] 6 mmHg CO:  [4.6 L/min-7 L/min] 7 L/min CI:  [2.2 L/min/m2-3.5 L/min/m2] 3.5 L/min/m2  - Pulm: EWOB.  SS CT output  ABG    Component Value Date/Time   PHART 7.365 03/22/2021 2111   PCO2ART 37.3 03/22/2021 2111   PO2ART 92 03/22/2021 2111   HCO3 21.1 03/22/2021 2111   TCO2 22 03/22/2021 2111   ACIDBASEDEF 4.0 (H) 03/22/2021 2111   O2SAT 97.0 03/22/2021 2111    - Abd: soft - Extremity: warm  .Intake/Output      04/13 0701 04/14 0700 04/14 0701 04/15 0700   P.O.     I.V. (mL/kg) 3323.5 (34.7)    IV Piggyback 1394.4    Total Intake(mL/kg) 4717.9 (49.2)    Urine (mL/kg/hr) 2140 (0.9)    Blood 600    Chest Tube 520    Total Output 3260    Net +1457.9            _______________________________________________________________ Labs: CBC Latest Ref Rng & Units 03/23/2021 03/22/2021 03/22/2021  WBC 4.0 - 10.5 K/uL 8.7 - 10.0  Hemoglobin 13.0 - 17.0 g/dL 11.0(L) 10.5(L) 11.5(L)  Hematocrit 39.0 - 52.0 % 32.9(L) 31.0(L) 34.4(L)  Platelets 150 - 400 K/uL 63(L) - 66(L)   CMP Latest Ref Rng & Units 03/23/2021 03/22/2021 03/22/2021  Glucose 70 - 99 mg/dL 03/24/2021) - 914(N)  BUN 8 - 23 mg/dL 16 - 18  Creatinine 829(F - 1.24 mg/dL 6.21) - 3.08(M)  Sodium 135 - 145 mmol/L 137 141 138  Potassium 3.5 - 5.1  mmol/L 4.5 3.6 3.6  Chloride 98 - 111 mmol/L 107 - 108  CO2 22 - 32 mmol/L 20(L) - 23  Calcium 8.9 - 10.3 mg/dL 5.78(I) - 8.4(L)  Total Protein 6.5 - 8.1 g/dL - - 5.3(L)  Total Bilirubin 0.3 - 1.2 mg/dL - - 2.0(H)  Alkaline Phos 38 - 126 U/L - - 46  AST 15 - 41 U/L - - 34  ALT 0 - 44 U/L - - 21    CXR: pulm vascular congestion  _______________________________________________________________  Assessment and Plan: POD 1 s/p CABG 3  Neuro: adding robaxin, and lidocain patches for pain control CV: weaning pressors and dob. Today.  Will keep flow trac and swan for now Pulm: continue pulm toilet Renal: good uop, creat trending down.  Gentle diuresis today GI: advancing diet Heme: stable ID: [email or web address] Endo: SSI Dispo: continue ICU care   6.9(G 03/23/2021 8:28 AM

## 2021-03-23 NOTE — Progress Notes (Signed)
Patient ID: Edward [name], male   DOB: Dec 11, 1955, 65 y.o.   MRN: [id number] TCTS Evening Rounds:  Weaned off dobut and NE this am with drop in BP and CI. Now back on dobut 5 and NE 10. BP by cuff is 108/59 but by A-line is 79/58. A-line tracing is variable so doubt accuracy. PA 41/18. CI measurement is 1.1. It was 3.4 this am.  Urine output marginal and rise in creat and K+ on BMET this evening. Will repeat to be sure K+ is accurate before treating for hyperkalemia. BMET    Component Value Date/Time   NA 130 (L) 03/23/2021 1655   NA 149 (H) 03/16/2021 1557   K 6.6 (HH) 03/23/2021 1655   CL 103 03/23/2021 1655   CO2 13 (L) 03/23/2021 1655   GLUCOSE 235 (H) 03/23/2021 1655   BUN 21 03/23/2021 1655   BUN 24 03/16/2021 1557   CREATININE 2.46 (H) 03/23/2021 1655   CALCIUM 8.2 (L) 03/23/2021 1655   GFRNONAA 28 (L) 03/23/2021 1655   GFRAA 68 09/28/2008 1012   CBC    Component Value Date/Time   WBC 14.1 (H) 03/23/2021 1655   RBC 4.14 (L) 03/23/2021 1655   HGB 12.4 (L) 03/23/2021 1655   HGB 17.1 03/16/2021 1557   HCT 38.0 (L) 03/23/2021 1655   HCT 51.2 (H) 03/16/2021 1557   PLT 61 (L) 03/23/2021 1655   PLT 141 (L) 03/16/2021 1557   MCV 91.8 03/23/2021 1655   MCV 88 03/16/2021 1557   MCH 30.0 03/23/2021 1655   MCHC 32.6 03/23/2021 1655   RDW 12.8 03/23/2021 1655   RDW 12.9 03/16/2021 1557   LYMPHSABS 0.4 (L) 03/22/2021 2100   MONOABS 0.7 03/22/2021 2100   EOSABS 0.0 03/22/2021 2100   BASOSABS 0.0 03/22/2021 2100   Chest tube output low.

## 2021-03-23 NOTE — Progress Notes (Signed)
   Vitals stable this morning. Vaso and Dobutamine stopped per Dr Cliffton Asters. Given 40 mg IV lasix x1.   Metoprolol 12.5 mg given at 1000--> Developed hypotension.   Norepi restarted per Dr Cliffton Asters. SBP remains 90s.  I stopped metoprolol and would not add for now with severely reduced EF.   Continue to monitor.   Anaira Seay NP-C  1:57 PM '

## 2021-03-23 NOTE — Progress Notes (Signed)
Lab called potassium of 6.6. Dr. Cliffton Asters paged.

## 2021-03-23 NOTE — TOC Initial Note (Signed)
Transition of Care (TOC) - Initial/Assessment Note  Heart Failure  Patient Details  Name: Savaughn Karwowski MRN: [id number] Date of Birth: [birth year]  Transition of Care [facility]) CM/SW Contact:    Mauri Tolen, LCSWA Phone Number: 03/23/2021, 10:09 AM  Clinical Narrative:              CSW attempted to visit the patient at bedside to introduce self as the heart failure social worker and to complete a very brief SDOH screening with the patient to address social needs as needed however the patient was unresponsive and unable to engage in conversation at this time.   CSW will continue to follow for discharge needs and will check back with the patient at another time.         Barriers to Discharge: Continued Medical Work up   Patient Goals and CMS Choice        Expected Discharge Plan and Services   In-house Referral: Clinical Social Work                                            Prior Living Arrangements/Services                       Activities of Daily Living Home Assistive Devices/Equipment: Blood pressure cuff ADL Screening (condition at time of admission) Patient's cognitive ability adequate to safely complete daily activities?: Yes Is the patient deaf or have difficulty hearing?: No Does the patient have difficulty seeing, even when wearing glasses/contacts?: No Does the patient have difficulty concentrating, remembering, or making decisions?: No Patient able to express need for assistance with ADLs?: Yes Does the patient have difficulty dressing or bathing?: No Independently performs ADLs?: Yes (appropriate for developmental age) Does the patient have difficulty walking or climbing stairs?: No Weakness of Legs: None Weakness of Arms/Hands: None  Permission Sought/Granted                  Emotional Assessment Appearance:: Appears stated age Attitude/Demeanor/Rapport: Unable to Assess Affect (typically observed): Unable to Assess     Psych  Involvement: No (comment)  Admission diagnosis:  Cardiomyopathy, ischemic [I25.5] Coronary artery disease [I25.10] Patient Active Problem List   Diagnosis Date Noted  . Coronary artery disease 03/22/2021  . Cardiomyopathy, ischemic 03/20/2021  . Ischemic cardiomyopathy   . Thrombocytopenia (HCC) 10/05/2020  . Episodic lightheadedness 03/01/2020  . Paresthesia 01/27/2020  . Left knee pain 02/05/2019  . Elevated PSA 09/22/2017  . Hyperglycemia 10/13/2014  . Hyperlipidemia 10/13/2014  . Coronary artery disease involving native coronary artery of native heart 10/10/2014  . Welcome to Medicare preventive visit 10/10/2014  . Advance care planning 10/10/2014  . HCV antibody positive 04/30/2008   PCP:  Joaquim Nam, MD Pharmacy:   [facility] [address] [place] - [address] [place] [address] Phone: [phone] Fax: [phone]     Social Determinants of Health (SDOH) Interventions    Readmission Risk Interventions No flowsheet data found.  Teron Blais, MSW, LCSWA [phone] Heart Failure Social Worker

## 2021-03-23 NOTE — Progress Notes (Addendum)
Advanced Heart Failure Rounding Note  PCP-Cardiologist: Donato Schultz, MD   Subjective:   4/13 S/P CABG x3. Extubated last night.   Remains on Dobutamine 2.5 mcg + Vasopressin 0.02 units.   Creatinine 1.7>1.6  Swan #s  CVP 17 CO 7 CI 3.5  SVR 605  Complaining of pain. Walked this morning.   Cardiac MRI: 1. Moderately dilated LV with EF 15%, global hypokinesis with lateral akinesis. 2.  Normal RV size with EF 31% 3. LGE pattern suggests substantial viability (LGE noted primarily in lateral wall, <50% wall thickness)   Objective:   Weight Range: 95.9 kg Body mass index is 31.22 kg/m.   Vital Signs:   Temp:  [98.06 F (36.7 C)-100.2 F (37.9 C)] 98.8 F (37.1 C) (04/14 0700) Pulse Rate:  [88-115] 98 (04/14 0700) Resp:  [12-41] 24 (04/14 0700) BP: (102-140)/(48-82) 131/76 (04/14 0700) SpO2:  [91 %-100 %] 96 % (04/14 0700) Arterial Line BP: (89-154)/(42-75) 131/54 (04/14 0700) FiO2 (%):  [40 %-50 %] 40 % (04/13 2000) Weight:  [95.9 kg] 95.9 kg (04/14 0500) Last BM Date: 03/22/21  Weight change: Filed Weights   03/21/21 0646 03/22/21 0344 03/23/21 0500  Weight: 91.2 kg 89.9 kg 95.9 kg    Intake/Output:   Intake/Output Summary (Last 24 hours) at 03/23/2021 0737 Last data filed at 03/23/2021 0700 Gross per 24 hour  Intake 4717.94 ml  Output 3260 ml  Net 1457.94 ml      Physical Exam   CVP 17  General:   No resp difficulty HEENT: normal Neck: supple. JVP elevated. Carotids 2+ bilat; no bruits. No lymphadenopathy or thryomegaly appreciated. RIJ  Cor: PMI nondisplaced. Regular rate & rhythm. No rubs, gallops or murmurs. Lungs: clear Abdomen: soft, nontender, nondistended. No hepatosplenomegaly. No bruits or masses. Good bowel sounds. Extremities: no cyanosis, clubbing, rash, edema.. LUE A line  Neuro: alert & orientedx3, cranial nerves grossly intact. moves all 4 extremities w/o difficulty. Affect pleasant   Telemetry  SR 90s   EKG    N/a    Labs    CBC Recent Labs    03/22/21 2100 03/22/21 2111 03/23/21 0345  WBC 10.0  --  8.7  NEUTROABS 8.8*  --   --   HGB 11.5* 10.5* 11.0*  HCT 34.4* 31.0* 32.9*  MCV 89.8  --  90.4  PLT 66*  --  63*   Basic Metabolic Panel Recent Labs    73/53/29 2100 03/22/21 2111 03/23/21 0345  NA 138 141 137  K 3.6 3.6 4.5  CL 108  --  107  CO2 23  --  20*  GLUCOSE 179*  --  206*  BUN 18  --  16  CREATININE 1.72*  --  1.62*  CALCIUM 8.4*  --  8.6*  MG 2.8*  --  2.1  PHOS 1.8*  --   --    Liver Function Tests Recent Labs    03/22/21 2100  AST 34  ALT 21  ALKPHOS 46  BILITOT 2.0*  PROT 5.3*  ALBUMIN 3.8   No results for input(s): LIPASE, AMYLASE in the last 72 hours. Cardiac Enzymes No results for input(s): CKTOTAL, CKMB, CKMBINDEX, TROPONINI in the last 72 hours.  BNP: BNP (last 3 results) Recent Labs    02/13/21 1356  BNP 998.0*    ProBNP (last 3 results) Recent Labs    03/14/21 0731  PROBNP 4,235*     D-Dimer No results for input(s): DDIMER in the last 72 hours.  Hemoglobin A1C Recent Labs    03/21/21 2025  HGBA1C 6.4*   Fasting Lipid Panel Recent Labs    03/21/21 0831  CHOL 163  HDL 33*  LDLCALC 118*  TRIG 62  CHOLHDL 4.9   Thyroid Function Tests No results for input(s): TSH, T4TOTAL, T3FREE, THYROIDAB in the last 72 hours.  Invalid input(s): FREET3  Other results:   Imaging    DG Chest Port 1 View  Result Date: 03/23/2021 CLINICAL DATA:  Respiratory distress EXAM: PORTABLE CHEST 1 VIEW COMPARISON:  03/22/2021 FINDINGS: Interval extubation. Lung volumes are extremely small and pulmonary insufflation has diminished since prior examination. Increasing bibasilar atelectasis noted. No pneumothorax or pleural effusion. Right internal jugular Swan-Ganz catheter is again seen with its tip within the right pulmonary artery. Mediastinal drain unchanged. Coronary artery bypass grafting has been performed. Cardiac size is at the upper limits of  normal. IMPRESSION: Interval extubation with decreasing pulmonary insufflation. Lung volumes are extremely small. Electronically Signed   By: Helyn Numbers MD   On: 03/23/2021 06:40   DG Chest Port 1 View  Result Date: 03/22/2021 CLINICAL DATA:  Postop CABG, intubated EXAM: PORTABLE CHEST 1 VIEW COMPARISON:  03/21/2021 FINDINGS: Single frontal view of the chest demonstrates postsurgical changes from median sternotomy and bypass surgery. Endotracheal tube overlies tracheal air column tip just below thoracic inlet. Enteric catheter passes below diaphragm, tip excluded by collimation but side port projecting just below gastroesophageal junction. Right internal jugular flow directed catheter tip overlies the main pulmonary outflow tract. Mediastinal drains are noted. Cardiac silhouette remains mildly enlarged. There are bibasilar areas of consolidation most consistent with atelectasis. Small left pleural effusion. Trace right apical pneumothorax, with pleural separation measuring approximately 3 mm. No left-sided pneumothorax. No acute bony abnormalities. IMPRESSION: 1. Trace right apical pneumothorax, volume estimated far less than 5%. Pleural separation measuring 3 mm. 2. Bibasilar hypoventilatory changes, with small left pleural effusion. 3. Support devices as above. Critical Value/emergent results were called by telephone at the time of interpretation on 03/22/2021 at 3:30 pm to provider [facility] , who verbally acknowledged these results. Electronically Signed   By: Sharlet Salina M.D.   On: 03/22/2021 16:00   MR CARDIAC MORPHOLOGY W WO CONTRAST  Result Date: 03/22/2021 CLINICAL DATA:  Ischemic cardiomyopathy, assess viability. EXAM: CARDIAC MRI TECHNIQUE: The patient was scanned on a 1.5 Tesla GE magnet. A dedicated cardiac coil was used. Functional imaging was done using Fiesta sequences. 2,3, and 4 chamber views were done to assess for RWMA's. Modified Simpson's rule using a short axis stack was  used to calculate an ejection fraction on a dedicated work [employer]. The patient received 9 cc of Gadavist. After 10 minutes inversion recovery sequences were used to assess for infiltration and scar tissue. CONTRAST:  Gadavist 9 cc FINDINGS: Small left pleural effusion.  Trivial pericardial effusion. Moderately dilated left ventricle with normal wall thickness. There was global hypokinesis with akinesis of the inferolateral and anterolateral walls, EF 15%. Normal right ventricular size with moderate systolic dysfunction, EF 31%. Mild left atrial enlargement. Normal right atrium. Trileaflet aortic valve with no significant stenosis or regurgitation. There was at least mild mitral regurgitation (flow sequences to quantify were not done). Delayed enhancement imaging: 25-49% wall thickness subendocardial late gadolinium enhancement (LGE) in the entire inferolateral and anterolateral walls. <25% wall thickness subendocardial LGE in the mid to apical anterior wall. Measurements: LVEDV 295 mL LVSV 44 mL LVEF 15% RVEDV 132 mL RVSV 41 mL RVEF 31%  IMPRESSION: 1. Moderately dilated LV with EF 15%, global hypokinesis with lateral akinesis. 2.  Normal RV size with EF 31% 3. LGE pattern suggests substantial viability (LGE noted primarily in lateral wall, <50% wall thickness) Javel Hersh Electronically Signed   By: Marca Ancona M.D.   On: 03/22/2021 14:03     Medications:     Scheduled Medications: . acetaminophen  1,000 mg Oral Q6H   Or  . acetaminophen (TYLENOL) oral liquid 160 mg/5 mL  1,000 mg Per Tube Q6H  . aspirin EC  325 mg Oral Daily   Or  . aspirin  324 mg Per Tube Daily  . atorvastatin  80 mg Oral Daily  . bisacodyl  10 mg Oral Daily   Or  . bisacodyl  10 mg Rectal Daily  . Chlorhexidine Gluconate Cloth  6 each Topical Daily  . docusate sodium  200 mg Oral Daily  . metoprolol tartrate  12.5 mg Oral BID   Or  . metoprolol tartrate  12.5 mg Per Tube BID  . [START ON  03/24/2021] pantoprazole  40 mg Oral Daily  . potassium chloride  20 mEq Oral Q4H  . sodium chloride flush  3 mL Intravenous Q12H    Infusions: . sodium chloride 20 mL/hr at 03/23/21 0700  . sodium chloride    . sodium chloride 10 mL/hr at 03/22/21 1500  . cefUROXime (ZINACEF)  IV 1.5 g (03/22/21 2336)  . dexmedetomidine (PRECEDEX) IV infusion Stopped (03/22/21 1740)  . DOBUTamine 2.5 mcg/kg/min (03/23/21 0700)  . epinephrine Stopped (03/22/21 1500)  . insulin 4.6 mL/hr at 03/23/21 0700  . lactated ringers    . lactated ringers    . lactated ringers 10 mL/hr at 03/23/21 0700  . nitroGLYCERIN 0 mcg/min (03/22/21 1536)  . norepinephrine (LEVOPHED) Adult infusion Stopped (03/23/21 0347)  . phenylephrine (NEO-SYNEPHRINE) Adult infusion    . vasopressin 0.02 Units/min (03/23/21 0700)    PRN Medications: sodium chloride, dextrose, lactated ringers, loratadine, metoprolol tartrate, midazolam, morphine injection, ondansetron (ZOFRAN) IV, oxyCODONE, sodium chloride flush, sodium chloride flush, traMADol   Assessment/Plan  1. CAD - Had MI in 2004 with DESx2 with totally occluded CFX (infarct-related artery) and 80% mRCA stenosis. Patient had Taxus DES to both vessels.  -  Cath  Multivessel diseased. CT surgery consulted.  - S/P CABG x3 4/13 - Maps/hemodynamics ok.  - Come off  Vasopressin. -Continue dobutamine.   2. A/C Systolic Heart Failure 03/14/21 EF 20-25% with normal RV  -See above.  - Start to diurese.  - Add digoxin  - Renal function stable.   3. Hyperlipidemia  On high intensity statin.   4. CKD Stage IIIa Creatinine baseline 1.4-1.6   Ambulate   Length of Stay: 3  Amy Clegg, NP  03/23/2021, 7:37 AM  Advanced Heart Failure Team Pager [phone] (M-F; 7a - 5p)  Please contact [facility] for night-coverage after hours (5p -7a ) and weekends on [email or web address]  Patient seen with NP, agree with the above note.   Stable s/p CABG, on vasopressin 0.01 and dobutamine  2.5 with stable MAP and CI 3.5.  Creatinine 1.6.  CVP 17, weight up post-op.   General: NAD Neck: JVP 12 cm, no thyromegaly or thyroid nodule.  Lungs: Clear to auscultation bilaterally with normal respiratory effort. CV: Sternotomy.  Heart regular S1/S2, no S3/S4, no murmur.  No peripheral edema.   Abdomen: Soft, nontender, no hepatosplenomegaly, no distention.  Skin: Intact without lesions or rashes.  Neurologic: Alert and oriented x 3.  Psych: Normal affect. Extremities: No clubbing or cyanosis.  HEENT: Normal.   1. CAD: Had MI in 2004 with DESx2 with totally occluded CFX (infarct-related artery) and 80% mRCA stenosis. Patient had Taxus DES to both vessels.  Recently, noted to have EF down to 20-25% on echo, set up for coronary angiography. LHC on 4/11 showed high-grade subtotal stenoses of the LAD, left circumflex, and total occlusion of the previously placed stents in the circumflex marginal and RCA. There are extensive collaterals supplying the distal RCA, and collaterals supplying the obtuse marginal vessel. Collaterals are jeopardized due to the high-grade subtotal stenoses of the LAD and circumflex vessel. There are excellent distal targets. CMRI with substantial viability.  4/13 patient had CABG with LIMA-LAD, SVG-OM, SVG-PDA.   - Continue ASA 81  - Continue atorvastatin 80 daily.  2. Acute systolic CHF: Echo in 4/22 with EF 20-25%, severe LV dilation, mildly dilated RV with normal systolic function, moderate MR. Ischemic cardiomyopathy.  RHC 4/11 with elevated PCWP and CI 1.7. cMRI with LV EF 15%, RV EF 31%.  Now s/p CABG.  Volume up with CVP 17.  He is on vasopressin 0.01 with dobutamine 2.5, MAP stable. CI 3.5. - Looks like he can wean off vasopressin.  - Continue dobutamine.  - Start digoxin 0.125 - Lasix 40 mg IV bid today.  - Follow co-ox.  3. CKD stage 3: Creatinine 1.6 today, near baseline.  4. Hyperlipidemia: Atorvastatin 80 daily.  5. Thrombocytopenia: Platelets mildly  low at baseline, ?ITP.  Post-op down to 63, likely inflammatory.  Follow closely.   CRITICAL CARE Performed by: Marca Ancona  Total critical care time: 35 minutes  Critical care time was exclusive of separately billable procedures and treating other patients.  Critical care was necessary to treat or prevent imminent or life-threatening deterioration.  Critical care was time spent personally by me on the following activities: development of treatment plan with patient and/or surrogate as well as nursing, discussions with consultants, evaluation of patient's response to treatment, examination of patient, obtaining history from patient or surrogate, ordering and performing treatments and interventions, ordering and review of laboratory studies, ordering and review of radiographic studies, pulse oximetry and re-evaluation of patient's condition.  Marca Ancona 03/23/2021 8:05 AM

## 2021-03-24 ENCOUNTER — Inpatient Hospital Stay (HOSPITAL_COMMUNITY): Payer: Medicare Other

## 2021-03-24 DIAGNOSIS — R570 Cardiogenic shock: Secondary | ICD-10-CM | POA: Diagnosis not present

## 2021-03-24 LAB — GLUCOSE, CAPILLARY
Glucose-Capillary: 165 mg/dL — ABNORMAL HIGH (ref 70–99)
Glucose-Capillary: 165 mg/dL — ABNORMAL HIGH (ref 70–99)
Glucose-Capillary: 158 mg/dL — ABNORMAL HIGH (ref 70–99)
Glucose-Capillary: 154 mg/dL — ABNORMAL HIGH (ref 70–99)
Glucose-Capillary: 149 mg/dL — ABNORMAL HIGH (ref 70–99)
Glucose-Capillary: 135 mg/dL — ABNORMAL HIGH (ref 70–99)
Glucose-Capillary: 150 mg/dL — ABNORMAL HIGH (ref 70–99)
Glucose-Capillary: 134 mg/dL — ABNORMAL HIGH (ref 70–99)
Glucose-Capillary: 159 mg/dL — ABNORMAL HIGH (ref 70–99)
Glucose-Capillary: 143 mg/dL — ABNORMAL HIGH (ref 70–99)
Glucose-Capillary: 124 mg/dL — ABNORMAL HIGH (ref 70–99)
Glucose-Capillary: 162 mg/dL — ABNORMAL HIGH (ref 70–99)
Glucose-Capillary: 144 mg/dL — ABNORMAL HIGH (ref 70–99)
Glucose-Capillary: 119 mg/dL — ABNORMAL HIGH (ref 70–99)
Glucose-Capillary: 115 mg/dL — ABNORMAL HIGH (ref 70–99)
Glucose-Capillary: 121 mg/dL — ABNORMAL HIGH (ref 70–99)

## 2021-03-24 LAB — CBC
HCT: 30.5 % — ABNORMAL LOW (ref 39.0–52.0)
Hemoglobin: 10 g/dL — ABNORMAL LOW (ref 13.0–17.0)
MCH: 29.7 pg (ref 26.0–34.0)
MCHC: 32.8 g/dL (ref 30.0–36.0)
MCV: 90.5 fL (ref 80.0–100.0)
Platelets: 50 10*3/uL — ABNORMAL LOW (ref 150–400)
RBC: 3.37 MIL/uL — ABNORMAL LOW (ref 4.22–5.81)
RDW: 12.8 % (ref 11.5–15.5)
WBC: 12.6 10*3/uL — ABNORMAL HIGH (ref 4.0–10.5)
nRBC: 0 % (ref 0.0–0.2)

## 2021-03-24 LAB — BASIC METABOLIC PANEL
Anion gap: 6 (ref 5–15)
BUN: 25 mg/dL — ABNORMAL HIGH (ref 8–23)
CO2: 23 mmol/L (ref 22–32)
Calcium: 8.6 mg/dL — ABNORMAL LOW (ref 8.9–10.3)
Chloride: 103 mmol/L (ref 98–111)
Creatinine, Ser: 2.04 mg/dL — ABNORMAL HIGH (ref 0.61–1.24)
GFR, Estimated: 36 mL/min — ABNORMAL LOW (ref >60)
Glucose, Bld: 173 mg/dL — ABNORMAL HIGH (ref 70–99)
Potassium: 4.7 mmol/L (ref 3.5–5.1)
Sodium: 132 mmol/L — ABNORMAL LOW (ref 135–145)

## 2021-03-24 LAB — COOXEMETRY PANEL
Carboxyhemoglobin: 1.1 % (ref 0.5–1.5)
Carboxyhemoglobin: 1.1 % (ref 0.5–1.5)
Methemoglobin: 1.2 % (ref 0.0–1.5)
Methemoglobin: 1 % (ref 0.0–1.5)
O2 Saturation: 53.1 %
O2 Saturation: 60.9 %
Total hemoglobin: 9.6 g/dL — ABNORMAL LOW (ref 12.0–16.0)
Total hemoglobin: 9.7 g/dL — ABNORMAL LOW (ref 12.0–16.0)

## 2021-03-24 NOTE — Progress Notes (Signed)
[address]       [place] [address]             [phone]                 2 Days Post-Op Procedure(s) (LRB): CORONARY ARTERY BYPASS GRAFTING (CABG) X  THREE USING LEFT INTERNAL MAMMARY ARTERY AND RIGHT ENDOSCOPIC SAPHENOUS VEIN HARVEST CONDUITS (N/A) TRANSESOPHAGEAL ECHOCARDIOGRAM (TEE) (N/A)   Events: Cardiogenic shock last night.  Patient did improve with volume and starting milrinone.  _______________________________________________________________ Vitals: BP 129/71   Pulse 81   Temp (!) 97.16 F (36.2 C)   Resp 16   Ht 5\' 9"  (1.753 m)   Wt 99.8 kg   SpO2 100%   BMI 32.49 kg/m   - Neuro: alert NAD  - Cardiovascular: sinus  Drips: dob @5 , milrinone at 0.25 PAP: (34-48)/(12-30) 37/17 CO:  [5.2 L/min] 5.2 L/min CI:  [2.5 L/min/m2] 2.5 L/min/m2  - Pulm: EWOB.  SS CT output  ABG    Component Value Date/Time   PHART 7.365 03/22/2021 2111   PCO2ART 37.3 03/22/2021 2111   PO2ART 92 03/22/2021 2111   HCO3 21.1 03/22/2021 2111   TCO2 22 03/22/2021 2111   ACIDBASEDEF 4.0 (H) 03/22/2021 2111   O2SAT 60.9 03/24/2021 0824    - Abd: soft - Extremity: warm  .Intake/Output      04/14 0701 04/15 0700 04/15 0701 04/16 0700   P.O. 340    I.V. (mL/kg) 1415.4 (14.2) 206.5 (2.1)   IV Piggyback 450.4 100   Total Intake(mL/kg) 2205.8 (22.1) 306.5 (3.1)   Urine (mL/kg/hr) 540 (0.2) 200 (0.3)   Blood     Chest Tube 330 60   Total Output 870 260   Net +1335.8 +46.5           _______________________________________________________________ Labs: CBC Latest Ref Rng & Units 03/24/2021 03/23/2021 03/23/2021  WBC 4.0 - 10.5 K/uL 12.6(H) 14.1(H) 8.7  Hemoglobin 13.0 - 17.0 g/dL 10.0(L) 12.4(L) 11.0(L)  Hematocrit 39.0 - 52.0 % 30.5(L) 38.0(L) 32.9(L)  Platelets 150 - 400 K/uL 50(L) 61(L) 63(L)   CMP Latest Ref Rng & Units 03/24/2021 03/23/2021 03/23/2021  Glucose 70 - 99 mg/dL 03/25/2021) 03/25/2021) 102(V)  BUN 8 - 23 mg/dL 253(G) 22 21  Creatinine 0.61 -  1.24 mg/dL 644(I) 34(V) 4.25(Z)  Sodium 135 - 145 mmol/L 132(L) 130(L) 130(L)  Potassium 3.5 - 5.1 mmol/L 4.7 5.8(H) 6.6(HH)  Chloride 98 - 111 mmol/L 103 103 103  CO2 22 - 32 mmol/L 23 15(L) 13(L)  Calcium 8.9 - 10.3 mg/dL 5.63(O) 8.1(L) 8.2(L)  Total Protein 6.5 - 8.1 g/dL - - -  Total Bilirubin 0.3 - 1.2 mg/dL - - -  Alkaline Phos 38 - 126 U/L - - -  AST 15 - 41 U/L - - -  ALT 0 - 44 U/L - - -    CXR: pulm vascular congestion  _______________________________________________________________  Assessment and Plan: POD 2 s/p CABG 3  Neuro: Pain under better control today CV: Cardiogenic shock likely due to hypovolemia.  Patient did improve with 1 L of albumin as well as more inotropic.  He is off of vasopressors at this point.  We will continue inotropic support.  We will likely wean dobutamine first and then the milrinone.  Would hold off on diuresis until patient is only on one inotrope. Pulm: continue pulm toilet.  We will keep chest tubes for now given that we are not diuresing him yet.  Renal: Acute kidney injury but improving from yesterday.  Would hold off on diuresis for now. GI: On diet Heme: stable ID: [email or web address] Endo: SSI Dispo: continue ICU care   Edward [name] 03/24/2021 2:23 PM

## 2021-03-24 NOTE — Progress Notes (Signed)
TCTS Evening Rounds  POD #2 s/p CABG Improving with intropic support (milrinone/dobut) No complaints PE:  BP 134/79   Pulse 87   Temp 97.7 F (36.5 C)   Resp (!) 28   Ht 5\' 9"  (1.753 m)   Wt 99.8 kg   SpO2 100%   BMI 32.49 kg/m  Alert/oriented CTA RRR Mild peripheral edema   Intake/Output Summary (Last 24 hours) at 03/24/2021 1632 Last data filed at 03/24/2021 1500 Gross per 24 hour  Intake 1977.85 ml  Output 1040 ml  Net 937.85 ml    A/P:  Continue resuscitation Continue inotropes May diurese tomorrow Ariq Khamis Z. June, MD [phone]

## 2021-03-24 NOTE — Progress Notes (Addendum)
Advanced Heart Failure Rounding Note  PCP-Cardiologist: Donato Schultz, MD   Subjective:    4/13 S/P CABG x3, now extubated.   Got hypotensive yesterday, felt to be dry. Given IVFs. DBA restarted. Milrinone and NE also added overnight.   Off VP. Remains on DBA, dose increased to 5 yesterday. Milrinone 0.25. NE 1. Co-ox 42>>53%. CVP not reading accurately.   Now w/ AKI but creatinine drifting down 1.7>1.6>2.7>2.4>2.0   Decreased UOP, only 540 cc out yesterday. Diuretics on hold. ~20 lb above pre-op wt.   Plts continue to drop, 61>>50K. Hgb down 12.4>>10.0.   Swan #s  CVP 17 CO 5.15 CI 2.50  OOB sitting up in chair. Feels ok. No complaints. Denies dyspnea but requiring supp O2.   Cardiac MRI: 1. Moderately dilated LV with EF 15%, global hypokinesis with lateral akinesis. 2.  Normal RV size with EF 31% 3. LGE pattern suggests substantial viability (LGE noted primarily in lateral wall, <50% wall thickness)   Objective:   Weight Range: 99.8 kg Body mass index is 32.49 kg/m.   Vital Signs:   Temp:  [97 F (36.1 C)-98.78 F (37.1 C)] 97.16 F (36.2 C) (04/15 0700) Pulse Rate:  [66-101] 81 (04/15 0700) Resp:  [10-37] 16 (04/15 0700) BP: (91-147)/(60-79) 126/68 (04/15 0700) SpO2:  [90 %-100 %] 96 % (04/15 0700) Arterial Line BP: (74-140)/(36-112) 80/64 (04/15 0700) Weight:  [99.8 kg] 99.8 kg (04/15 0500) Last BM Date: 03/22/21  Weight change: Filed Weights   03/22/21 0344 03/23/21 0500 03/24/21 0500  Weight: 89.9 kg 95.9 kg 99.8 kg    Intake/Output:   Intake/Output Summary (Last 24 hours) at 03/24/2021 0716 Last data filed at 03/24/2021 0700 Gross per 24 hour  Intake 2205.79 ml  Output 870 ml  Net 1335.79 ml      Physical Exam    CVP not reading properly. RN to troubleshoot General:   No resp difficulty HEENT: normal Neck: supple. JVP elevated. Carotids 2+ bilat; no bruits. No lymphadenopathy or thryomegaly appreciated. RIJ  Cor: PMI nondisplaced.  Regular rate & rhythm. No rubs, gallops or murmurs. + CTs, + sternal dressing Lungs: clear, no wheezing  Abdomen: soft, nontender, nondistended. No hepatosplenomegaly. No bruits or masses. Good bowel sounds. Extremities: no cyanosis, clubbing, rash, trace bilateral LE edema. Neuro: alert & orientedx3, cranial nerves grossly intact. moves all 4 extremities w/o difficulty. Affect pleasant   Telemetry   NSR 80s, occasional PVCs.  EKG    N/a   Labs    CBC Recent Labs    03/22/21 2100 03/22/21 2111 03/23/21 1655 03/24/21 0329  WBC 10.0   < > 14.1* 12.6*  NEUTROABS 8.8*  --   --   --   HGB 11.5*   < > 12.4* 10.0*  HCT 34.4*   < > 38.0* 30.5*  MCV 89.8   < > 91.8 90.5  PLT 66*   < > 61* 50*   < > = values in this interval not displayed.   Basic Metabolic Panel Recent Labs    26/94/85 2100 03/22/21 2111 03/23/21 0345 03/23/21 1655 03/23/21 1902 03/24/21 0329  NA 138   < > 137 130* 130* 132*  K 3.6   < > 4.5 6.6* 5.8* 4.7  CL 108  --  107 103 103 103  CO2 23  --  20* 13* 15* 23  GLUCOSE 179*  --  206* 235* 219* 173*  BUN 18  --  16 21 22  25*  CREATININE  1.72*  --  1.62* 2.46* 2.38* 2.04*  CALCIUM 8.4*  --  8.6* 8.2* 8.1* 8.6*  MG 2.8*  --  2.1 2.4  --   --   PHOS 1.8*  --   --   --   --   --    < > = values in this interval not displayed.   Liver Function Tests Recent Labs    03/22/21 2100  AST 34  ALT 21  ALKPHOS 46  BILITOT 2.0*  PROT 5.3*  ALBUMIN 3.8   No results for input(s): LIPASE, AMYLASE in the last 72 hours. Cardiac Enzymes No results for input(s): CKTOTAL, CKMB, CKMBINDEX, TROPONINI in the last 72 hours.  BNP: BNP (last 3 results) Recent Labs    02/13/21 1356  BNP 998.0*    ProBNP (last 3 results) Recent Labs    03/14/21 0731  PROBNP 4,235*     D-Dimer No results for input(s): DDIMER in the last 72 hours. Hemoglobin A1C Recent Labs    03/21/21 2025  HGBA1C 6.4*   Fasting Lipid Panel Recent Labs    03/21/21 0831  CHOL  163  HDL 33*  LDLCALC 118*  TRIG 62  CHOLHDL 4.9   Thyroid Function Tests No results for input(s): TSH, T4TOTAL, T3FREE, THYROIDAB in the last 72 hours.  Invalid input(s): FREET3  Other results:   Imaging    No results found.   Medications:     Scheduled Medications: . acetaminophen  1,000 mg Oral Q6H   Or  . acetaminophen (TYLENOL) oral liquid 160 mg/5 mL  1,000 mg Per Tube Q6H  . aspirin EC  325 mg Oral Daily   Or  . aspirin  324 mg Per Tube Daily  . atorvastatin  80 mg Oral Daily  . bisacodyl  10 mg Oral Daily   Or  . bisacodyl  10 mg Rectal Daily  . Chlorhexidine Gluconate Cloth  6 each Topical Daily  . digoxin  0.125 mg Oral Daily  . docusate sodium  200 mg Oral Daily  . enoxaparin (LOVENOX) injection  40 mg Subcutaneous QHS  . insulin aspart  0-24 Units Subcutaneous Q4H  . lidocaine  2 patch Transdermal Q24H  . methocarbamol  500 mg Oral TID  . pantoprazole  40 mg Oral Daily  . sodium chloride flush  3 mL Intravenous Q12H    Infusions: . sodium chloride 20 mL/hr at 03/23/21 1753  . sodium chloride    . sodium chloride 20 mL/hr at 03/24/21 0700  . cefUROXime (ZINACEF)  IV Stopped (03/23/21 2246)  . DOBUTamine 5 mcg/kg/min (03/24/21 0700)  . insulin 2.2 mL/hr at 03/24/21 0700  . lactated ringers    . lactated ringers    . lactated ringers Stopped (03/23/21 0840)  . milrinone 0.25 mcg/kg/min (03/24/21 0700)  . norepinephrine (LEVOPHED) Adult infusion 1 mcg/min (03/24/21 0700)  . vasopressin Stopped (03/23/21 0758)    PRN Medications: sodium chloride, dextrose, lactated ringers, loratadine, metoprolol tartrate, midazolam, morphine injection, ondansetron (ZOFRAN) IV, oxyCODONE, sodium chloride flush, sodium chloride flush, traMADol   Assessment/Plan   1. CAD: Had MI in 2004 with DESx2 with totally occluded CFX (infarct-related artery) and 80% mRCA stenosis. Patient had Taxus DES to both vessels.  Recently, noted to have EF down to 20-25% on echo,  set up for coronary angiography. LHC on 4/11 showed high-grade subtotal stenoses of the LAD, left circumflex, and total occlusion of the previously placed stents in the circumflex marginal and RCA. There are extensive collaterals  supplying the distal RCA, and collaterals supplying the obtuse marginal vessel. Collaterals are jeopardized due to the high-grade subtotal stenoses of the LAD and circumflex vessel. There are excellent distal targets. CMRI with substantial viability.  4/13 patient had CABG with LIMA-LAD, SVG-OM, SVG-PDA.   - Continue ASA 81  - Continue atorvastatin 80 daily.  2. Acute systolic CHF: Echo in 4/22 with EF 20-25%, severe LV dilation, mildly dilated RV with normal systolic function, moderate MR. Ischemic cardiomyopathy.  RHC 4/11 with elevated PCWP and CI 1.7. cMRI with LV EF 15%, RV EF 31%.  Now s/p CABG. Became hypotensive yesterday and received IVFs, DBA increased to 5, milrinone 0.25 and NE added. NE weaned from 10>>1 overnight. MAPs in 70s currently. Co-ox 53%. Diuretics on hold for AKI, per CT surgery. He is ~20 lb above preop wt.  - D/w CT surgery, they would like to hold diuretics today. Will likely need to resume tomorrow.  - Continue dobutamine 5 - Continue milrionne 0.25 - Wean NE - C/w digoxin 0.125 - Follow co-ox.  - monitor rhythm on tele w/ dual inotropes  3. AKI on CKD stage 3: Creatinine 1.6 yesterday, bumped to 2.5 in setting of hypotension. Improving today>>down to 2.0.  - continue inotropes, keep MAPs >65 and follow BMP  4. Hyperlipidemia: Atorvastatin 80 daily.  5. Thrombocytopenia: Platelets mildly low at baseline, ?ITP.  Post-op down to 63>>50K. Follow closely. Monitor for bleeding    Length of Stay: [address] PA-C  03/24/2021, 7:16 AM  Advanced Heart Failure Team Pager [phone] (M-F; 7a - 5p)  Please contact [facility] for night-coverage after hours (5p -7a ) and weekends on [email or web address]  Patient seen with PA, agree with the above  note.   Yesterday, dobutamine/vasopressin stopped then got Lasix and metoprolol. Became hypotensive with AKI, rise in creatinine to 2.46.  Inotropes/pressor restarted.  This morning, he is on dobutamine 5 + milrinone 0.25, NE off again.  SBP 120s (cuff, arterial line inaccurate).  Creatinine 2.46 => 2.04. He feels much better.   Swan: CVP 12-13 PA 41/20 CI 2.4 Co-ox 53%  General: NAD Neck: JVP 10-12 cm, no thyromegaly or thyroid nodule.  Lungs: Decreased at bases. CV: Nondisplaced PMI.  Heart regular S1/S2, no S3/S4, no murmur.  1+ ankle edema.  Abdomen: Soft, nontender, no hepatosplenomegaly, no distention.  Skin: Intact without lesions or rashes.  Neurologic: Alert and oriented x 3.  Psych: Normal affect. Extremities: No clubbing or cyanosis.  HEENT: Normal.   1. CAD:Had MI in 2004 with DESx2 with totally occluded CFX (infarct-related artery) and 80% mRCA stenosis. Patient had Taxus DES to both vessels.Recently, noted to have EF down to 20-25% on echo, set up for coronary angiography. LHC on 4/11 showed high-grade subtotal stenoses of the LAD, left circumflex, and total occlusion of the previously placed stents in the circumflex marginal and RCA. There are extensive collaterals supplying the distal RCA, and collaterals supplying the obtuse marginal vessel. Collaterals are jeopardized due to the high-grade subtotal stenoses of the LAD and circumflex vessel. There are excellent distal targets.CMRI with substantial viability.  4/13 patient had CABG with LIMA-LAD, SVG-OM, SVG-PDA.   - Continue ASA 81  - Continue atorvastatin 80 daily.  2. Acute systolic CHF: Echo in 4/22 with EF 20-25%, severe LV dilation, mildly dilated RV with normal systolic function, moderate MR. Ischemic cardiomyopathy. RHC 4/11 with elevated PCWP and CI 1.7. cMRI with LV EF 15%, RV EF 31%.  Now s/p CABG.  He is  now on dobutamine 5 + milrinone 0.25, he is off NE.  CI 2.4 today with CVP 12-13 with weight up about  20 lbs from baseline. - Continue current inotropes for now, will need very slow/cautious wean (did poorly with rapid wean pressors/inotropes yesterday).   - Hold digoxin until creatinine lower.  - No beta blocker for now.  - Will need diuresis, will wait until this afternoon to start though.   - Repeat co-ox, 53% in early am but good CO by thermo.  3. AKI on CKD stage 3: creatinine up to to 2.46 yesterday, down to 2.04 today. Baseline 1.6.  Maintain cardiac output.   4. Hyperlipidemia: Atorvastatin 80 daily. 5. Thrombocytopenia: Platelets mildly low at baseline, ?ITP.  Post-op down to 50, likely inflammatory.  Follow closely.   Mobilize.   CRITICAL CARE Performed by: Marca Ancona  Total critical care time: 35 minutes  Critical care time was exclusive of separately billable procedures and treating other patients.  Critical care was necessary to treat or prevent imminent or life-threatening deterioration.  Critical care was time spent personally by me on the following activities: development of treatment plan with patient and/or surrogate as well as nursing, discussions with consultants, evaluation of patient's response to treatment, examination of patient, obtaining history from patient or surrogate, ordering and performing treatments and interventions, ordering and review of laboratory studies, ordering and review of radiographic studies, pulse oximetry and re-evaluation of patient's condition.  Marca Ancona 03/24/2021 8:07 AM

## 2021-03-25 DIAGNOSIS — I5021 Acute systolic (congestive) heart failure: Secondary | ICD-10-CM | POA: Diagnosis not present

## 2021-03-25 DIAGNOSIS — I2510 Atherosclerotic heart disease of native coronary artery without angina pectoris: Secondary | ICD-10-CM | POA: Diagnosis not present

## 2021-03-25 LAB — TYPE AND SCREEN
ABO/RH(D): B POS
Antibody Screen: NEGATIVE
Unit division: 0
Unit division: 0
Unit division: 0
Unit division: 0

## 2021-03-25 LAB — BPAM RBC
Blood Product Expiration Date: 202204192359
Blood Product Expiration Date: 202204192359
Blood Product Expiration Date: 202204242359
Blood Product Expiration Date: 202204242359
ISSUE DATE / TIME: 202204150842
ISSUE DATE / TIME: 202204160016
Unit Type and Rh: 7300
Unit Type and Rh: 7300
Unit Type and Rh: 7300
Unit Type and Rh: 7300

## 2021-03-25 LAB — BASIC METABOLIC PANEL
Anion gap: 6 (ref 5–15)
Anion gap: 6 (ref 5–15)
BUN: 23 mg/dL (ref 8–23)
BUN: 21 mg/dL (ref 8–23)
CO2: 25 mmol/L (ref 22–32)
CO2: 29 mmol/L (ref 22–32)
Calcium: 8.5 mg/dL — ABNORMAL LOW (ref 8.9–10.3)
Calcium: 8.5 mg/dL — ABNORMAL LOW (ref 8.9–10.3)
Chloride: 101 mmol/L (ref 98–111)
Chloride: 96 mmol/L — ABNORMAL LOW (ref 98–111)
Creatinine, Ser: 1.52 mg/dL — ABNORMAL HIGH (ref 0.61–1.24)
Creatinine, Ser: 1.68 mg/dL — ABNORMAL HIGH (ref 0.61–1.24)
GFR, Estimated: 51 mL/min — ABNORMAL LOW (ref >60)
GFR, Estimated: 45 mL/min — ABNORMAL LOW (ref >60)
Glucose, Bld: 122 mg/dL — ABNORMAL HIGH (ref 70–99)
Glucose, Bld: 162 mg/dL — ABNORMAL HIGH (ref 70–99)
Potassium: 4.1 mmol/L (ref 3.5–5.1)
Potassium: 3.9 mmol/L (ref 3.5–5.1)
Sodium: 132 mmol/L — ABNORMAL LOW (ref 135–145)
Sodium: 131 mmol/L — ABNORMAL LOW (ref 135–145)

## 2021-03-25 LAB — CBC
HCT: 29 % — ABNORMAL LOW (ref 39.0–52.0)
Hemoglobin: 9.7 g/dL — ABNORMAL LOW (ref 13.0–17.0)
MCH: 30.2 pg (ref 26.0–34.0)
MCHC: 33.4 g/dL (ref 30.0–36.0)
MCV: 90.3 fL (ref 80.0–100.0)
Platelets: 39 10*3/uL — ABNORMAL LOW (ref 150–400)
RBC: 3.21 MIL/uL — ABNORMAL LOW (ref 4.22–5.81)
RDW: 12.8 % (ref 11.5–15.5)
WBC: 8.8 10*3/uL (ref 4.0–10.5)
nRBC: 0 % (ref 0.0–0.2)

## 2021-03-25 LAB — GLUCOSE, CAPILLARY
Glucose-Capillary: 107 mg/dL — ABNORMAL HIGH (ref 70–99)
Glucose-Capillary: 110 mg/dL — ABNORMAL HIGH (ref 70–99)
Glucose-Capillary: 111 mg/dL — ABNORMAL HIGH (ref 70–99)
Glucose-Capillary: 111 mg/dL — ABNORMAL HIGH (ref 70–99)
Glucose-Capillary: 114 mg/dL — ABNORMAL HIGH (ref 70–99)
Glucose-Capillary: 117 mg/dL — ABNORMAL HIGH (ref 70–99)
Glucose-Capillary: 122 mg/dL — ABNORMAL HIGH (ref 70–99)
Glucose-Capillary: 126 mg/dL — ABNORMAL HIGH (ref 70–99)
Glucose-Capillary: 142 mg/dL — ABNORMAL HIGH (ref 70–99)

## 2021-03-25 LAB — COOXEMETRY PANEL
Carboxyhemoglobin: 0.9 % (ref 0.5–1.5)
Carboxyhemoglobin: 0.8 % (ref 0.5–1.5)
Methemoglobin: 1 % (ref 0.0–1.5)
Methemoglobin: 1.1 % (ref 0.0–1.5)
O2 Saturation: 66.3 %
O2 Saturation: 44.5 %
Total hemoglobin: 9.9 g/dL — ABNORMAL LOW (ref 12.0–16.0)
Total hemoglobin: 10 g/dL — ABNORMAL LOW (ref 12.0–16.0)

## 2021-03-25 MED ORDER — NOREPINEPHRINE 4 MG/250ML-% IV SOLN: 0.0000 ug/min | INTRAVENOUS | Status: DC

## 2021-03-25 MED ORDER — INSULIN ASPART 100 UNIT/ML ~~LOC~~ SOLN: 0.0000 [IU] | SUBCUTANEOUS | Status: DC

## 2021-03-25 MED ORDER — INSULIN ASPART 100 UNIT/ML ~~LOC~~ SOLN
0.0000 [IU] | SUBCUTANEOUS | Status: DC
  Administered 2021-03-25 – 2021-03-26 (×4): 2 [IU] via SUBCUTANEOUS
  Administered 2021-03-26: 4 [IU] via SUBCUTANEOUS
  Administered 2021-03-26 (×2): 2 [IU] via SUBCUTANEOUS
  Administered 2021-03-27: 8 [IU] via SUBCUTANEOUS

## 2021-03-25 MED ORDER — FUROSEMIDE 10 MG/ML IJ SOLN
80.0000 mg | Freq: Once | INTRAMUSCULAR | Status: AC
  Administered 2021-03-25: 80 mg via INTRAVENOUS
  Filled 2021-03-25: qty 8

## 2021-03-25 MED ORDER — POTASSIUM CHLORIDE CRYS ER 20 MEQ PO TBCR
20.0000 meq | EXTENDED_RELEASE_TABLET | Freq: Two times a day (BID) | ORAL | Status: DC
  Administered 2021-03-25 – 2021-03-26 (×3): 20 meq via ORAL
  Filled 2021-03-25 (×3): qty 1

## 2021-03-25 MED ORDER — DOBUTAMINE IN D5W 4-5 MG/ML-% IV SOLN
4.0000 ug/kg/min | INTRAVENOUS | Status: DC
  Administered 2021-03-26 – 2021-03-27 (×3): 5 ug/kg/min via INTRAVENOUS
  Filled 2021-03-25 (×2): qty 250

## 2021-03-25 MED ORDER — FUROSEMIDE 10 MG/ML IJ SOLN
6.0000 mg/h | INTRAVENOUS | Status: DC
  Administered 2021-03-25: 8 mg/h via INTRAVENOUS
  Filled 2021-03-25 (×2): qty 20

## 2021-03-25 NOTE — Progress Notes (Signed)
      [address]       [place] [address]             [phone]                 3 Days Post-Op Procedure(s) (LRB): CORONARY ARTERY BYPASS GRAFTING (CABG) X  THREE USING LEFT INTERNAL MAMMARY ARTERY AND RIGHT ENDOSCOPIC SAPHENOUS VEIN HARVEST CONDUITS (N/A) TRANSESOPHAGEAL ECHOCARDIOGRAM (TEE) (N/A)   Events: No events overnight _______________________________________________________________ Vitals: BP 100/78   Pulse 85   Temp 98.06 F (36.7 C)   Resp 19   Ht 5\' 9"  (1.753 m)   Wt 100.4 kg   SpO2 98%   BMI 32.69 kg/m   - Neuro: alert NAD  - Cardiovascular: sinus  Drips: dob @5 , milrinone at 0.25 PAP: (42-64)/(12-24) 42/17 CO:  [5 L/min] 5 L/min CI:  [2.4 L/min/m2-2.5 L/min/m2] 2.4 L/min/m2  - Pulm: EWOB.  SS CT output  ABG    Component Value Date/Time   PHART 7.365 03/22/2021 2111   PCO2ART 37.3 03/22/2021 2111   PO2ART 92 03/22/2021 2111   HCO3 21.1 03/22/2021 2111   TCO2 22 03/22/2021 2111   ACIDBASEDEF 4.0 (H) 03/22/2021 2111   O2SAT 66.3 03/25/2021 0324    - Abd: soft - Extremity: warm  .Intake/Output      04/15 0701 04/16 0700 04/16 0701 04/17 0700   P.O. 250    I.V. (mL/kg) 545.7 (5.4)    IV Piggyback 100    Total Intake(mL/kg) 895.7 (8.9)    Urine (mL/kg/hr) 1160 (0.5)    Chest Tube 190    Total Output 1350    Net -454.3            _______________________________________________________________ Labs: CBC Latest Ref Rng & Units 03/25/2021 03/24/2021 03/23/2021  WBC 4.0 - 10.5 K/uL 8.8 12.6(H) 14.1(H)  Hemoglobin 13.0 - 17.0 g/dL 03/26/2021) 10.0(L) 12.4(L)  Hematocrit 39.0 - 52.0 % 29.0(L) 30.5(L) 38.0(L)  Platelets 150 - 400 K/uL 39(L) 50(L) 61(L)   CMP Latest Ref Rng & Units 03/25/2021 03/24/2021 03/23/2021  Glucose 70 - 99 mg/dL 03/26/2021) 03/25/2021) 211(H)  BUN 8 - 23 mg/dL 23 417(E) 22  Creatinine 0.61 - 1.24 mg/dL 081(K) 48(J) 8.56(D)  Sodium 135 - 145 mmol/L 132(L) 132(L) 130(L)  Potassium 3.5 - 5.1 mmol/L 4.1 4.7 5.8(H)   Chloride 98 - 111 mmol/L 101 103 103  CO2 22 - 32 mmol/L 25 23 15(L)  Calcium 8.9 - 10.3 mg/dL 1.49(F) 0.26(V) 8.1(L)  Total Protein 6.5 - 8.1 g/dL - - -  Total Bilirubin 0.3 - 1.2 mg/dL - - -  Alkaline Phos 38 - 126 U/L - - -  AST 15 - 41 U/L - - -  ALT 0 - 44 U/L - - -    CXR: pulm vascular congestion  _______________________________________________________________  Assessment and Plan: POD 3 s/p CABG 3  Neuro: Pain under better control today CV: HD much better today.  Will wean dobutamine.  Will keep milr for now.  Will remove swan once off milr if CI > 2.2 Pulm: continue pulm toilet.  Minimal CT output.  Will ambulate today, and remove CTS Renal: Acute kidney injury but improving from yesterday.  Diuresed well on his on.  Once on 1 inotrope, will diurese gently GI: On diet Heme: stable ID: [email or web address] Endo: SSI Dispo: continue ICU care   7.8(H 03/25/2021 7:47 AM

## 2021-03-25 NOTE — Progress Notes (Addendum)
Advanced Heart Failure Rounding Note  PCP-Cardiologist: Donato Schultz, MD   Subjective:    4/13 S/P CABG x3, now extubated.   Dobutamine stopped this am. Co-ox 66% -> 44%   Creatinine down 2.0 -> 1.5. Weight unchanged. Still 15 pounds above baseline   Swan: CVP  PA 53/21  Thermo 3.4/1.7    Cardiac MRI: 1. Moderately dilated LV with EF 15%, global hypokinesis with lateral akinesis. 2.  Normal RV size with EF 31% 3. LGE pattern suggests substantial viability (LGE noted primarily in lateral wall, <50% wall thickness)   Objective:   Weight Range: 100.4 kg Body mass index is 32.69 kg/m.   Vital Signs:   Temp:  [97.7 F (36.5 C)-99 F (37.2 C)] 98.8 F (37.1 C) (04/16 1045) Pulse Rate:  [68-98] 82 (04/16 1045) Resp:  [8-35] 16 (04/16 1045) BP: (93-149)/(52-87) 115/66 (04/16 1045) SpO2:  [94 %-100 %] 97 % (04/16 1045) Weight:  [100.4 kg] 100.4 kg (04/16 0500) Last BM Date: 03/22/21  Weight change: Filed Weights   03/23/21 0500 03/24/21 0500 03/25/21 0500  Weight: 95.9 kg 99.8 kg 100.4 kg    Intake/Output:   Intake/Output Summary (Last 24 hours) at 03/25/2021 1157 Last data filed at 03/25/2021 1100 Gross per 24 hour  Intake 786.1 ml  Output 1730 ml  Net -943.9 ml      Physical Exam    General: Sitting in chair No resp difficulty HEENT: normal Neck: supple. RIJ swan/ JVP to ear Carotids 2+ bilat; no bruits. No lymphadenopathy or thryomegaly appreciated. Cor: Sternal wound ok PMI nondisplaced. Regular rate & rhythm. No rubs, gallops or murmurs. Lungs: clear Abdomen: soft, nontender, nondistended. No hepatosplenomegaly. No bruits or masses. Good bowel sounds. Extremities: no cyanosis, clubbing, rash, 2-3+ edema cool  Neuro: alert & orientedx3, cranial nerves grossly intact. moves all 4 extremities w/o difficulty. Affect pleasant    Telemetry   NSR 80s occasional PVCs. Personally reviewed  Labs    CBC Recent Labs    03/22/21 2100  03/22/21 2111 03/24/21 0329 03/25/21 0324  WBC 10.0   < > 12.6* 8.8  NEUTROABS 8.8*  --   --   --   HGB 11.5*   < > 10.0* 9.7*  HCT 34.4*   < > 30.5* 29.0*  MCV 89.8   < > 90.5 90.3  PLT 66*   < > 50* 39*   < > = values in this interval not displayed.   Basic Metabolic Panel Recent Labs    66/29/47 2100 03/22/21 2111 03/23/21 0345 03/23/21 1655 03/23/21 1902 03/24/21 0329 03/25/21 0324  NA 138   < > 137 130*   < > 132* 132*  K 3.6   < > 4.5 6.6*   < > 4.7 4.1  CL 108  --  107 103   < > 103 101  CO2 23  --  20* 13*   < > 23 25  GLUCOSE 179*  --  206* 235*   < > 173* 122*  BUN 18  --  16 21   < > 25* 23  CREATININE 1.72*  --  1.62* 2.46*   < > 2.04* 1.52*  CALCIUM 8.4*  --  8.6* 8.2*   < > 8.6* 8.5*  MG 2.8*  --  2.1 2.4  --   --   --   PHOS 1.8*  --   --   --   --   --   --    < > =  values in this interval not displayed.   Liver Function Tests Recent Labs    03/22/21 2100  AST 34  ALT 21  ALKPHOS 46  BILITOT 2.0*  PROT 5.3*  ALBUMIN 3.8   No results for input(s): LIPASE, AMYLASE in the last 72 hours. Cardiac Enzymes No results for input(s): CKTOTAL, CKMB, CKMBINDEX, TROPONINI in the last 72 hours.  BNP: BNP (last 3 results) Recent Labs    02/13/21 1356  BNP 998.0*    ProBNP (last 3 results) Recent Labs    03/14/21 0731  PROBNP 4,235*     D-Dimer No results for input(s): DDIMER in the last 72 hours. Hemoglobin A1C No results for input(s): HGBA1C in the last 72 hours. Fasting Lipid Panel No results for input(s): CHOL, HDL, LDLCALC, TRIG, CHOLHDL, LDLDIRECT in the last 72 hours. Thyroid Function Tests No results for input(s): TSH, T4TOTAL, T3FREE, THYROIDAB in the last 72 hours.  Invalid input(s): FREET3  Other results:   Imaging    No results found.   Medications:     Scheduled Medications: . acetaminophen  1,000 mg Oral Q6H   Or  . acetaminophen (TYLENOL) oral liquid 160 mg/5 mL  1,000 mg Per Tube Q6H  . aspirin EC  325 mg Oral  Daily   Or  . aspirin  324 mg Per Tube Daily  . atorvastatin  80 mg Oral Daily  . bisacodyl  10 mg Oral Daily   Or  . bisacodyl  10 mg Rectal Daily  . Chlorhexidine Gluconate Cloth  6 each Topical Daily  . docusate sodium  200 mg Oral Daily  . enoxaparin (LOVENOX) injection  40 mg Subcutaneous QHS  . insulin aspart  0-24 Units Subcutaneous Q4H  . lidocaine  2 patch Transdermal Q24H  . methocarbamol  500 mg Oral TID  . pantoprazole  40 mg Oral Daily  . sodium chloride flush  3 mL Intravenous Q12H    Infusions: . sodium chloride 20 mL/hr at 03/23/21 1753  . sodium chloride    . sodium chloride Stopped (03/24/21 1613)  . DOBUTamine 5 mcg/kg/min (03/25/21 1116)  . insulin Stopped (03/25/21 0803)  . lactated ringers    . lactated ringers    . lactated ringers Stopped (03/23/21 0840)  . milrinone 0.25 mcg/kg/min (03/25/21 1100)  . norepinephrine (LEVOPHED) Adult infusion Stopped (03/24/21 0729)  . vasopressin Stopped (03/23/21 0758)    PRN Medications: sodium chloride, dextrose, lactated ringers, loratadine, metoprolol tartrate, midazolam, morphine injection, ondansetron (ZOFRAN) IV, oxyCODONE, sodium chloride flush, sodium chloride flush, traMADol   Assessment/Plan   1. CAD: Had MI in 2004 with DESx2 with totally occluded CFX (infarct-related artery) and 80% mRCA stenosis. Patient had Taxus DES to both vessels.  Recently, noted to have EF down to 20-25% on echo, set up for coronary angiography. LHC on 4/11 showed high-grade subtotal stenoses of the LAD, left circumflex, and total occlusion of the previously placed stents in the circumflex marginal and RCA. There are extensive collaterals supplying the distal RCA, and collaterals supplying the obtuse marginal vessel. Collaterals are jeopardized due to the high-grade subtotal stenoses of the LAD and circumflex vessel. There are excellent distal targets. CMRI with substantial viability.  4/13 patient had CABG with LIMA-LAD, SVG-OM,  SVG-PDA.  No s/s angina  - Continue ASA 81  - Continue atorvastatin 80 daily.  2. Acute systolic CHF: Echo in 4/22 with EF 20-25%, severe LV dilation, mildly dilated RV with normal systolic function, moderate MR. Ischemic cardiomyopathy.  RHC 4/11 with  elevated PCWP and CI 1.7. cMRI with LV EF 15%, RV EF 31%.  Now s/p CABG. Became hypotensive yesterday and received IVFs, DBA increased to 5, milrinone 0.25 and NE added. Off NE. DBA stopped this am and had low output - Remains markedly fluid overloaded. Start lasix gtt at 8  - Resume dobutamine 5 - Continue milrionne 0.25 - Follow swan numbers closely - Do not wean inotropes until fully diuresed - C/w digoxin 0.125 - monitor rhythm on tele w/ dual inotropes  3. AKI on CKD stage 3: Creatinine 1.6 yesterday, bumped to 2.5 in setting of hypotension. Improving today>>down to 1,5 - continue inotropes, keep MAPs >65 and follow BMP  4. Hyperlipidemia: Atorvastatin 80 daily.  5. Thrombocytopenia: Platelets mildly low at baseline, ?ITP.  Post-op down to 63>>50K >>39k Follow closely. Monitor for bleeding  - Check HIT  CRITICAL CARE Performed by: Arvilla Meres  Total critical care time: 40 minutes  Critical care time was exclusive of separately billable procedures and treating other patients.  Critical care was necessary to treat or prevent imminent or life-threatening deterioration.  Critical care was time spent personally by me (independent of midlevel providers or residents) on the following activities: development of treatment plan with patient and/or surrogate as well as nursing, discussions with consultants, evaluation of patient's response to treatment, examination of patient, obtaining history from patient or surrogate, ordering and performing treatments and interventions, ordering and review of laboratory studies, ordering and review of radiographic studies, pulse oximetry and re-evaluation of patient's condition.     Length of Stay:  5  Arvilla Meres, MD  03/25/2021, 11:57 AM  Advanced Heart Failure Team Pager [phone] (M-F; 7a - 5p)  Please contact [facility] for night-coverage after hours (5p -7a ) and weekends on [email or web address]

## 2021-03-26 ENCOUNTER — Inpatient Hospital Stay (HOSPITAL_COMMUNITY): Payer: Medicare Other

## 2021-03-26 ENCOUNTER — Inpatient Hospital Stay: Payer: Self-pay

## 2021-03-26 DIAGNOSIS — R570 Cardiogenic shock: Secondary | ICD-10-CM | POA: Diagnosis not present

## 2021-03-26 DIAGNOSIS — I2510 Atherosclerotic heart disease of native coronary artery without angina pectoris: Secondary | ICD-10-CM | POA: Diagnosis not present

## 2021-03-26 LAB — COOXEMETRY PANEL
Carboxyhemoglobin: 1.1 % (ref 0.5–1.5)
Methemoglobin: 1.1 % (ref 0.0–1.5)
O2 Saturation: 67.9 %
Total hemoglobin: 11.2 g/dL — ABNORMAL LOW (ref 12.0–16.0)

## 2021-03-26 LAB — GLUCOSE, CAPILLARY
Glucose-Capillary: 103 mg/dL — ABNORMAL HIGH (ref 70–99)
Glucose-Capillary: 111 mg/dL — ABNORMAL HIGH (ref 70–99)
Glucose-Capillary: 124 mg/dL — ABNORMAL HIGH (ref 70–99)
Glucose-Capillary: 127 mg/dL — ABNORMAL HIGH (ref 70–99)
Glucose-Capillary: 135 mg/dL — ABNORMAL HIGH (ref 70–99)
Glucose-Capillary: 140 mg/dL — ABNORMAL HIGH (ref 70–99)
Glucose-Capillary: 145 mg/dL — ABNORMAL HIGH (ref 70–99)
Glucose-Capillary: 193 mg/dL — ABNORMAL HIGH (ref 70–99)

## 2021-03-26 LAB — BASIC METABOLIC PANEL
Anion gap: 10 (ref 5–15)
Anion gap: 5 (ref 5–15)
BUN: 21 mg/dL (ref 8–23)
BUN: 18 mg/dL (ref 8–23)
CO2: 31 mmol/L (ref 22–32)
CO2: 35 mmol/L — ABNORMAL HIGH (ref 22–32)
Calcium: 8.7 mg/dL — ABNORMAL LOW (ref 8.9–10.3)
Calcium: 8.9 mg/dL (ref 8.9–10.3)
Chloride: 95 mmol/L — ABNORMAL LOW (ref 98–111)
Chloride: 94 mmol/L — ABNORMAL LOW (ref 98–111)
Creatinine, Ser: 1.57 mg/dL — ABNORMAL HIGH (ref 0.61–1.24)
Creatinine, Ser: 1.36 mg/dL — ABNORMAL HIGH (ref 0.61–1.24)
GFR, Estimated: 49 mL/min — ABNORMAL LOW (ref >60)
GFR, Estimated: 58 mL/min — ABNORMAL LOW (ref >60)
Glucose, Bld: 130 mg/dL — ABNORMAL HIGH (ref 70–99)
Glucose, Bld: 190 mg/dL — ABNORMAL HIGH (ref 70–99)
Potassium: 3.9 mmol/L (ref 3.5–5.1)
Potassium: 3.8 mmol/L (ref 3.5–5.1)
Sodium: 136 mmol/L (ref 135–145)
Sodium: 134 mmol/L — ABNORMAL LOW (ref 135–145)

## 2021-03-26 LAB — CBC
HCT: 33 % — ABNORMAL LOW (ref 39.0–52.0)
Hemoglobin: 11.2 g/dL — ABNORMAL LOW (ref 13.0–17.0)
MCH: 30.7 pg (ref 26.0–34.0)
MCHC: 33.9 g/dL (ref 30.0–36.0)
MCV: 90.4 fL (ref 80.0–100.0)
Platelets: 70 10*3/uL — ABNORMAL LOW (ref 150–400)
RBC: 3.65 MIL/uL — ABNORMAL LOW (ref 4.22–5.81)
RDW: 12.7 % (ref 11.5–15.5)
WBC: 8.6 10*3/uL (ref 4.0–10.5)
nRBC: 0 % (ref 0.0–0.2)

## 2021-03-26 LAB — MAGNESIUM: Magnesium: 2.2 mg/dL (ref 1.7–2.4)

## 2021-03-26 MED ORDER — SORBITOL 70 % SOLN
30.0000 mL | Freq: Once | Status: AC
  Administered 2021-03-26: 30 mL via ORAL
  Filled 2021-03-26: qty 30

## 2021-03-26 MED ORDER — ENOXAPARIN SODIUM 40 MG/0.4ML ~~LOC~~ SOLN
40.0000 mg | SUBCUTANEOUS | Status: DC
  Administered 2021-03-26 – 2021-03-31 (×6): 40 mg via SUBCUTANEOUS
  Filled 2021-03-26 (×6): qty 0.4

## 2021-03-26 MED ORDER — ENOXAPARIN SODIUM 40 MG/0.4ML ~~LOC~~ SOLN
40.0000 mg | Freq: Every day | SUBCUTANEOUS | Status: DC
  Filled 2021-03-26: qty 0.4

## 2021-03-26 MED ORDER — SODIUM CHLORIDE 0.9% FLUSH
10.0000 mL | Freq: Two times a day (BID) | INTRAVENOUS | Status: DC
  Administered 2021-03-26: 10 mL

## 2021-03-26 MED ORDER — SODIUM CHLORIDE 0.9% FLUSH: 10.0000 mL | INTRAVENOUS | Status: DC | PRN

## 2021-03-26 MED ORDER — TORSEMIDE 20 MG PO TABS
40.0000 mg | ORAL_TABLET | Freq: Every day | ORAL | Status: DC
  Administered 2021-03-27: 40 mg via ORAL
  Filled 2021-03-26: qty 2

## 2021-03-26 MED ORDER — POTASSIUM CHLORIDE CRYS ER 20 MEQ PO TBCR
20.0000 meq | EXTENDED_RELEASE_TABLET | Freq: Every day | ORAL | Status: DC
  Administered 2021-03-27: 20 meq via ORAL
  Filled 2021-03-26: qty 1

## 2021-03-26 NOTE — Progress Notes (Signed)
Peripherally Inserted Central Catheter Placement  The IV Nurse has discussed with the patient and/or persons authorized to consent for the patient, the purpose of this procedure and the potential benefits and risks involved with this procedure.  The benefits include less needle sticks, lab draws from the catheter, and the patient may be discharged home with the catheter. Risks include, but not limited to, infection, bleeding, blood clot (thrombus formation), and puncture of an artery; nerve damage and irregular heartbeat and possibility to perform a PICC exchange if needed/ordered by physician.  Alternatives to this procedure were also discussed.  Bard Power PICC patient education guide, fact sheet on infection prevention and patient information card has been provided to patient /or left at bedside.  Wife at bedside with dtr, Bonita Quin on the phone, both also in agreement with PICC placement.  PICC Placement Documentation  PICC Double Lumen 03/26/21 PICC Right Brachial 39 cm 0 cm (Active)  Indication for Insertion or Continuance of Line Vasoactive infusions 03/26/21 1207  Exposed Catheter (cm) 0 cm 03/26/21 1207  Site Assessment Clean;Dry;Intact 03/26/21 1207  Lumen #1 Status Flushed;Saline locked;Blood return noted 03/26/21 1207  Lumen #2 Status Flushed;Saline locked;Blood return noted 03/26/21 1207  Dressing Type Transparent 03/26/21 1207  Dressing Status Clean;Dry;Intact 03/26/21 1207  Antimicrobial disc in place? Yes 03/26/21 1207  Safety Lock Not Applicable 03/26/21 1207  Line Care Connections checked and tightened 03/26/21 1207  Line Adjustment (NICU/IV Team Only) No 03/26/21 1207  Dressing Intervention New dressing 03/26/21 1207  Dressing Change Due 04/02/21 03/26/21 1207       Elliot Dally 03/26/2021, 12:07 PM

## 2021-03-26 NOTE — Progress Notes (Signed)
4 Days Post-Op Procedure(s) (LRB): CORONARY ARTERY BYPASS GRAFTING (CABG) X  THREE USING LEFT INTERNAL MAMMARY ARTERY AND RIGHT ENDOSCOPIC SAPHENOUS VEIN HARVEST CONDUITS (N/A) TRANSESOPHAGEAL ECHOCARDIOGRAM (TEE) (N/A) Subjective: tired  Objective: Vital signs in last 24 hours: Temp:  [98.1 F (36.7 C)-99.7 F (37.6 C)] 98.4 F (36.9 C) (04/17 0800) Pulse Rate:  [82-107] 93 (04/17 0800) Cardiac Rhythm: Normal sinus rhythm (04/17 0400) Resp:  [14-26] 21 (04/17 0800) BP: (98-153)/(62-88) 98/66 (04/17 0800) SpO2:  [91 %-100 %] 96 % (04/17 0800) Weight:  [93.8 kg-96.3 kg] 93.8 kg (04/17 0500)  Hemodynamic parameters for last 24 hours: PAP: (24-65)/(8-30) 25/9 CVP:  [2 mmHg-9 mmHg] 4 mmHg CO:  [5.5 L/min-7.2 L/min] 6.4 L/min CI:  [2.7 L/min/m2-3.5 L/min/m2] 3.1 L/min/m2  Intake/Output from previous day: 04/16 0701 - 04/17 0700 In: 420.4 [P.O.:50; I.V.:370.4] Out: 6510 [Urine:6390; Chest Tube:120] Intake/Output this shift: Total I/O In: 17.4 [I.V.:17.4] Out: 200 [Urine:200]  General appearance: alert and cooperative Neurologic: intact Heart: regular rate and rhythm, S1, S2 normal, no murmur, click, rub or gallop Lungs: diminished breath sounds bibasilar Abdomen: soft, non-tender; bowel sounds normal; no masses,  no organomegaly Extremities: edema mild Wound: c/d/i  Lab Results: Recent Labs    03/25/21 0324 03/26/21 0237  WBC 8.8 8.6  HGB 9.7* 11.2*  HCT 29.0* 33.0*  PLT 39* 70*   BMET:  Recent Labs    03/25/21 1905 03/26/21 0237  NA 131* 136  K 3.9 3.9  CL 96* 95*  CO2 29 31  GLUCOSE 162* 130*  BUN 21 21  CREATININE 1.68* 1.57*  CALCIUM 8.5* 8.7*    PT/INR: No results for input(s): LABPROT, INR in the last 72 hours. ABG    Component Value Date/Time   PHART 7.365 03/22/2021 2111   HCO3 21.1 03/22/2021 2111   TCO2 22 03/22/2021 2111   ACIDBASEDEF 4.0 (H) 03/22/2021 2111   O2SAT 67.9 03/26/2021 0237   CBG (last 3)  Recent Labs    03/26/21 0008  03/26/21 0320 03/26/21 0739  GLUCAP 111* 127* 193*    Assessment/Plan: S/P Procedure(s) (LRB): CORONARY ARTERY BYPASS GRAFTING (CABG) X  THREE USING LEFT INTERNAL MAMMARY ARTERY AND RIGHT ENDOSCOPIC SAPHENOUS VEIN HARVEST CONDUITS (N/A) TRANSESOPHAGEAL ECHOCARDIOGRAM (TEE) (N/A) Mobilize Diuresis d/c tubes/lines suggest removing swan so he can move around more  PICC consult   LOS: 6 days    Edward [name] 03/26/2021

## 2021-03-26 NOTE — Progress Notes (Signed)
Advanced Heart Failure Rounding Note  PCP-Cardiologist: Donato Schultz, MD   Subjective:    4/13 S/P CABG x3, now extubated.   Dobutamine stopped yesterday Co-ox 66% -> 44%. Dobutamine 5 restarted. Also on milrinone 0.25. Lasix gtt at 6. Weight down 6 pounds. Co-ox 68%  Feeling better. No dyspnea, orthopnea or PND. No BM yet.   Creatinine stable 1.6  Swan #s: CVP 5 PA 33/14 (23) Thermo 5.8/2.8 SVR 1109 PVR 1.5   Cardiac MRI: 1. Moderately dilated LV with EF 15%, global hypokinesis with lateral akinesis. 2.  Normal RV size with EF 31% 3. LGE pattern suggests substantial viability (LGE noted primarily in lateral wall, <50% wall thickness)   Objective:   Weight Range: 93.8 kg Body mass index is 30.54 kg/m.   Vital Signs:   Temp:  [98.1 F (36.7 C)-99.7 F (37.6 C)] 98.4 F (36.9 C) (04/17 0800) Pulse Rate:  [82-107] 93 (04/17 0800) Resp:  [14-26] 21 (04/17 0800) BP: (98-153)/(62-88) 98/66 (04/17 0800) SpO2:  [91 %-100 %] 96 % (04/17 0800) Weight:  [93.8 kg-96.3 kg] 93.8 kg (04/17 0500) Last BM Date: 03/22/21  Weight change: Filed Weights   03/25/21 0500 03/25/21 1900 03/26/21 0500  Weight: 100.4 kg 96.3 kg 93.8 kg    Intake/Output:   Intake/Output Summary (Last 24 hours) at 03/26/2021 0956 Last data filed at 03/26/2021 0800 Gross per 24 hour  Intake 416.45 ml  Output 6380 ml  Net -5963.55 ml      Physical Exam    General:  Sitting up in bed. No resp difficulty HEENT: normal Neck: supple. RIJ swanCarotids 2+ bilat; no bruits. No lymphadenopathy or thryomegaly appreciated. Cor: PMI nondisplaced. Sternal wound ok Regular rate & rhythm. No rubs, gallops or murmurs. Lungs: clear Abdomen: soft, nontender, + distended. No hepatosplenomegaly. No bruits or masses. Good bowel sounds. Extremities: no cyanosis, clubbing, rash, tr edema on R Neuro: alert & orientedx3, cranial nerves grossly intact. moves all 4 extremities w/o difficulty. Affect  pleasant   Telemetry   NSR 80-90s occasional PVCs. Personally reviewed  Labs    CBC Recent Labs    03/25/21 0324 03/26/21 0237  WBC 8.8 8.6  HGB 9.7* 11.2*  HCT 29.0* 33.0*  MCV 90.3 90.4  PLT 39* 70*   Basic Metabolic Panel Recent Labs    40/81/44 1655 03/23/21 1902 03/25/21 1905 03/26/21 0237  NA 130*   < > 131* 136  K 6.6*   < > 3.9 3.9  CL 103   < > 96* 95*  CO2 13*   < > 29 31  GLUCOSE 235*   < > 162* 130*  BUN 21   < > 21 21  CREATININE 2.46*   < > 1.68* 1.57*  CALCIUM 8.2*   < > 8.5* 8.7*  MG 2.4  --   --   --    < > = values in this interval not displayed.   Liver Function Tests No results for input(s): AST, ALT, ALKPHOS, BILITOT, PROT, ALBUMIN in the last 72 hours. No results for input(s): LIPASE, AMYLASE in the last 72 hours. Cardiac Enzymes No results for input(s): CKTOTAL, CKMB, CKMBINDEX, TROPONINI in the last 72 hours.  BNP: BNP (last 3 results) Recent Labs    02/13/21 1356  BNP 998.0*    ProBNP (last 3 results) Recent Labs    03/14/21 0731  PROBNP 4,235*     D-Dimer No results for input(s): DDIMER in the last 72 hours. Hemoglobin A1C  No results for input(s): HGBA1C in the last 72 hours. Fasting Lipid Panel No results for input(s): CHOL, HDL, LDLCALC, TRIG, CHOLHDL, LDLDIRECT in the last 72 hours. Thyroid Function Tests No results for input(s): TSH, T4TOTAL, T3FREE, THYROIDAB in the last 72 hours.  Invalid input(s): FREET3  Other results:   Imaging    DG Chest Port 1 View  Result Date: 03/26/2021 CLINICAL DATA:  Pleural effusion EXAM: PORTABLE CHEST 1 VIEW COMPARISON:  Chest x-ray dated 03/24/2021. FINDINGS: Stable hazy opacity at the LEFT lung base, compatible with atelectasis and/or small pleural effusion. Improved aeration at the RIGHT lung base, compatible with resolving atelectasis. No new lung findings. No pneumothorax is seen. Heart size and mediastinal contours appear stable. Swan-Ganz catheter is stable in position  with tip just to the RIGHT of midline. IMPRESSION: 1. Stable hazy opacity at the LEFT lung base, compatible with atelectasis and/or small pleural effusion. 2. Improved aeration at the RIGHT lung base, compatible with resolving atelectasis. Electronically Signed   By: Bary Richard M.D.   On: 03/26/2021 08:32   [place] EKG SITE RITE  Result Date: 03/26/2021 If Site Rite image not attached, placement could not be confirmed due to current cardiac rhythm.    Medications:     Scheduled Medications: . acetaminophen  1,000 mg Oral Q6H   Or  . acetaminophen (TYLENOL) oral liquid 160 mg/5 mL  1,000 mg Per Tube Q6H  . aspirin EC  325 mg Oral Daily   Or  . aspirin  324 mg Per Tube Daily  . atorvastatin  80 mg Oral Daily  . bisacodyl  10 mg Oral Daily   Or  . bisacodyl  10 mg Rectal Daily  . Chlorhexidine Gluconate Cloth  6 each Topical Daily  . docusate sodium  200 mg Oral Daily  . insulin aspart  0-24 Units Subcutaneous Q4H  . lidocaine  2 patch Transdermal Q24H  . pantoprazole  40 mg Oral Daily  . potassium chloride  20 mEq Oral BID  . sodium chloride flush  3 mL Intravenous Q12H    Infusions: . sodium chloride 20 mL/hr at 03/23/21 1753  . sodium chloride    . sodium chloride Stopped (03/24/21 1613)  . DOBUTamine 5 mcg/kg/min (03/26/21 0800)  . furosemide (LASIX) 200 mg in dextrose 5% 100 mL (2mg /mL) infusion 6 mg/hr (03/26/21 0800)  . insulin Stopped (03/25/21 0803)  . lactated ringers    . lactated ringers    . lactated ringers Stopped (03/23/21 0840)  . milrinone 0.25 mcg/kg/min (03/26/21 0800)  . norepinephrine (LEVOPHED) Adult infusion      PRN Medications: sodium chloride, dextrose, lactated ringers, loratadine, metoprolol tartrate, midazolam, morphine injection, ondansetron (ZOFRAN) IV, oxyCODONE, sodium chloride flush, sodium chloride flush, traMADol   Assessment/Plan   1. CAD: Had MI in 2004 with DESx2 with totally occluded CFX (infarct-related artery) and 80% mRCA  stenosis. Patient had Taxus DES to both vessels.  Recently, noted to have EF down to 20-25% on echo, set up for coronary angiography. LHC on 4/11 showed high-grade subtotal stenoses of the LAD, left circumflex, and total occlusion of the previously placed stents in the circumflex marginal and RCA. There are extensive collaterals supplying the distal RCA, and collaterals supplying the obtuse marginal vessel. Collaterals are jeopardized due to the high-grade subtotal stenoses of the LAD and circumflex vessel. There are excellent distal targets. CMRI with substantial viability.  4/13 patient had CABG with LIMA-LAD, SVG-OM, SVG-PDA.  No s/s angina - Continue ASA 81  -  Continue atorvastatin 80 daily.  - Sorbitol for post-op constipation 2. Acute systolic CHF: Echo in 4/22 with EF 20-25%, severe LV dilation, mildly dilated RV with normal systolic function, moderate MR. Ischemic cardiomyopathy.  RHC 4/11 with elevated PCWP and CI 1.7. cMRI with LV EF 15%, RV EF 31%. Dobutamine stopped 4/16 and co-ox feel to 44%. Now on dobutamine 5 milrinone 0.25. Diuresing well on lasix gtt. Weight down 6 pounds. Swan numbers much improved. Now euvolemic Co-ox 68% - Stop lasix gtt. Start torsemide 40 daily - Continue dobutamine 5 - Continue milrionne 0.25 can begin wean tomorrow - restart dig with improved creatinine - PICC now in. Pull swan  3. AKI on CKD stage 3: Creatinine 1.6 on 4/14, bumped to 2.5 on 4/15 in setting of hypotension. Improving with DBA support. Now 1,6 - continue inotropes, keep MAPs >65 and follow BMP  4. Hyperlipidemia: Atorvastatin 80 daily.  5. Thrombocytopenia: Platelets mildly low at baseline, ?ITP.  Post-op down to 63>>50K >>39k > (DVT dose lovenox stopped)> 70k  - PLTs rebounded after stopping low dose lovenox yesterday.  - HIT panel pending - wil restart DVT dose lovenox today and follow   CRITICAL CARE Performed by: Arvilla Meres  Total critical care time: 35 minutes  Critical  care time was exclusive of separately billable procedures and treating other patients.  Critical care was necessary to treat or prevent imminent or life-threatening deterioration.  Critical care was time spent personally by me (independent of midlevel providers or residents) on the following activities: development of treatment plan with patient and/or surrogate as well as nursing, discussions with consultants, evaluation of patient's response to treatment, examination of patient, obtaining history from patient or surrogate, ordering and performing treatments and interventions, ordering and review of laboratory studies, ordering and review of radiographic studies, pulse oximetry and re-evaluation of patient's condition.     Length of Stay: 6  Arvilla Meres, MD  03/26/2021, 9:56 AM  Advanced Heart Failure Team Pager [phone] (M-F; 7a - 5p)  Please contact [facility] for night-coverage after hours (5p -7a ) and weekends on [email or web address]

## 2021-03-27 ENCOUNTER — Inpatient Hospital Stay (HOSPITAL_COMMUNITY): Payer: Medicare Other

## 2021-03-27 DIAGNOSIS — R570 Cardiogenic shock: Secondary | ICD-10-CM | POA: Diagnosis not present

## 2021-03-27 LAB — GLUCOSE, CAPILLARY
Glucose-Capillary: 114 mg/dL — ABNORMAL HIGH (ref 70–99)
Glucose-Capillary: 269 mg/dL — ABNORMAL HIGH (ref 70–99)
Glucose-Capillary: 126 mg/dL — ABNORMAL HIGH (ref 70–99)
Glucose-Capillary: 210 mg/dL — ABNORMAL HIGH (ref 70–99)
Glucose-Capillary: 97 mg/dL (ref 70–99)
Glucose-Capillary: 97 mg/dL (ref 70–99)
Glucose-Capillary: 95 mg/dL (ref 70–99)

## 2021-03-27 LAB — BASIC METABOLIC PANEL
Anion gap: 9 (ref 5–15)
BUN: 17 mg/dL (ref 8–23)
CO2: 31 mmol/L (ref 22–32)
Calcium: 8.6 mg/dL — ABNORMAL LOW (ref 8.9–10.3)
Chloride: 94 mmol/L — ABNORMAL LOW (ref 98–111)
Creatinine, Ser: 1.35 mg/dL — ABNORMAL HIGH (ref 0.61–1.24)
GFR, Estimated: 58 mL/min — ABNORMAL LOW (ref >60)
Glucose, Bld: 201 mg/dL — ABNORMAL HIGH (ref 70–99)
Potassium: 3.8 mmol/L (ref 3.5–5.1)
Sodium: 134 mmol/L — ABNORMAL LOW (ref 135–145)

## 2021-03-27 LAB — CBC
HCT: 33 % — ABNORMAL LOW (ref 39.0–52.0)
Hemoglobin: 11.1 g/dL — ABNORMAL LOW (ref 13.0–17.0)
MCH: 29.8 pg (ref 26.0–34.0)
MCHC: 33.6 g/dL (ref 30.0–36.0)
MCV: 88.7 fL (ref 80.0–100.0)
Platelets: 86 10*3/uL — ABNORMAL LOW (ref 150–400)
RBC: 3.72 MIL/uL — ABNORMAL LOW (ref 4.22–5.81)
RDW: 12.7 % (ref 11.5–15.5)
WBC: 8.4 10*3/uL (ref 4.0–10.5)
nRBC: 0.2 % (ref 0.0–0.2)

## 2021-03-27 LAB — HEPARIN INDUCED PLATELET AB (HIT ANTIBODY): Heparin Induced Plt Ab: 0.083 OD (ref 0.000–0.400)

## 2021-03-27 LAB — COOXEMETRY PANEL
Carboxyhemoglobin: 1.3 % (ref 0.5–1.5)
Methemoglobin: 0.9 % (ref 0.0–1.5)
O2 Saturation: 59.9 %
Total hemoglobin: 10.6 g/dL — ABNORMAL LOW (ref 12.0–16.0)

## 2021-03-27 LAB — MAGNESIUM: Magnesium: 2.2 mg/dL (ref 1.7–2.4)

## 2021-03-27 MED ORDER — INSULIN ASPART 100 UNIT/ML ~~LOC~~ SOLN: 0.0000 [IU] | Freq: Three times a day (TID) | SUBCUTANEOUS | Status: DC

## 2021-03-27 MED ORDER — LOSARTAN POTASSIUM 25 MG PO TABS
12.5000 mg | ORAL_TABLET | Freq: Every day | ORAL | Status: DC
  Administered 2021-03-27: 12.5 mg via ORAL
  Filled 2021-03-27: qty 1

## 2021-03-27 MED ORDER — METOCLOPRAMIDE HCL 5 MG/ML IJ SOLN
10.0000 mg | Freq: Four times a day (QID) | INTRAMUSCULAR | Status: AC
  Administered 2021-03-27 (×2): 10 mg via INTRAVENOUS
  Filled 2021-03-27 (×2): qty 2

## 2021-03-27 MED ORDER — ONDANSETRON HCL 4 MG/2ML IJ SOLN
4.0000 mg | Freq: Once | INTRAMUSCULAR | Status: AC
  Administered 2021-03-27: 4 mg via INTRAVENOUS
  Filled 2021-03-27: qty 2

## 2021-03-27 MED ORDER — EPINEPHRINE 1 MG/10ML IJ SOSY
PREFILLED_SYRINGE | INTRAMUSCULAR | Status: AC
  Filled 2021-03-27: qty 10

## 2021-03-27 MED ORDER — DIGOXIN 125 MCG PO TABS
0.1250 mg | ORAL_TABLET | Freq: Every day | ORAL | Status: DC
  Administered 2021-03-27 – 2021-04-01 (×6): 0.125 mg via ORAL
  Filled 2021-03-27 (×6): qty 1

## 2021-03-27 MED FILL — Heparin Sodium (Porcine) Inj 1000 Unit/ML: INTRAMUSCULAR | Qty: 10 | Status: AC

## 2021-03-27 MED FILL — Calcium Chloride Inj 10%: INTRAVENOUS | Qty: 10 | Status: AC

## 2021-03-27 MED FILL — Electrolyte-R (PH 7.4) Solution: INTRAVENOUS | Qty: 3000 | Status: AC

## 2021-03-27 MED FILL — Lidocaine HCl Local Soln Prefilled Syringe 100 MG/5ML (2%): INTRAMUSCULAR | Qty: 5 | Status: AC

## 2021-03-27 MED FILL — Mannitol IV Soln 20%: INTRAVENOUS | Qty: 1000 | Status: AC

## 2021-03-27 MED FILL — Sodium Chloride IV Soln 0.9%: INTRAVENOUS | Qty: 2000 | Status: AC

## 2021-03-27 NOTE — Plan of Care (Signed)
  Problem: Education: Goal: Knowledge of General Education information will improve Description: Including pain rating scale, medication(s)/side effects and non-pharmacologic comfort measures Outcome: Progressing   Problem: Health Behavior/Discharge Planning: Goal: Ability to manage health-related needs will improve Outcome: Progressing   Problem: Clinical Measurements: Goal: Ability to maintain clinical measurements within normal limits will improve Outcome: Progressing Goal: Will remain free from infection Outcome: Progressing Goal: Diagnostic test results will improve Outcome: Progressing Goal: Respiratory complications will improve Outcome: Progressing Goal: Cardiovascular complication will be avoided Outcome: Progressing   Problem: Activity: Goal: Risk for activity intolerance will decrease Outcome: Progressing   Problem: Coping: Goal: Level of anxiety will decrease Outcome: Progressing   Problem: Elimination: Goal: Will not experience complications related to bowel motility Outcome: Progressing Goal: Will not experience complications related to urinary retention Outcome: Progressing   Problem: Pain Managment: Goal: General experience of comfort will improve Outcome: Progressing   Problem: Safety: Goal: Ability to remain free from injury will improve Outcome: Progressing   Problem: Skin Integrity: Goal: Risk for impaired skin integrity will decrease Outcome: Progressing   Problem: Education: Goal: Understanding of CV disease, CV risk reduction, and recovery process will improve Outcome: Progressing

## 2021-03-27 NOTE — Progress Notes (Signed)
CARDIAC REHAB PHASE I   PRE:  Rate/Rhythm: 103 ST    BP: sitting 110/78    SaO2: 94 RA  MODE:  Ambulation: 370 ft   POST:  Rate/Rhythm: 116 ST with PVCs    BP: sitting to BR     SaO2: 92 RA  Pt stood independently and walked independently in hall (I pushed IV pole). No major c/o but did not want to go farther. HR to 116 ST. To BR, left pt there. Encouraged IS. Wife present. Encouraged elevating feet when sitting. [id number]  Harriet Masson CES, ACSM 03/27/2021 1:52 PM

## 2021-03-27 NOTE — Progress Notes (Addendum)
Advanced Heart Failure Rounding Note  PCP-Cardiologist: Donato Schultz, MD   Subjective:    4/13 S/P CABG x3, now extubated.   Remains on dual inotropes, DBA 5+ milrinone 0.25. Co-ox 60%.   Lasix gtt stopped yesterday, transitioned to PO torsemide. CVP 9-10. Sinus tach on tele, low 100s.   Pts trending back up, nadir 39>>70>>86. HIT panel pending. Hgb stable at 11.   Feels ok, sitting up in chair. No complaints.   Cardiac MRI: 1. Moderately dilated LV with EF 15%, global hypokinesis with lateral akinesis. 2.  Normal RV size with EF 31% 3. LGE pattern suggests substantial viability (LGE noted primarily in lateral wall, <50% wall thickness)   Objective:   Weight Range: 93.8 kg Body mass index is 30.54 kg/m.   Vital Signs:   Temp:  [97.8 F (36.6 C)-99 F (37.2 C)] 99 F (37.2 C) (04/18 0400) Pulse Rate:  [90-116] 113 (04/18 0700) Resp:  [5-27] 18 (04/18 0700) BP: (92-146)/(44-105) 123/74 (04/18 0615) SpO2:  [89 %-100 %] 91 % (04/18 0700) Last BM Date: 03/26/21  Weight change: Filed Weights   03/25/21 0500 03/25/21 1900 03/26/21 0500  Weight: 100.4 kg 96.3 kg 93.8 kg    Intake/Output:   Intake/Output Summary (Last 24 hours) at 03/27/2021 0715 Last data filed at 03/27/2021 0700 Gross per 24 hour  Intake 722.53 ml  Output 2000 ml  Net -1277.47 ml      Physical Exam    CVP 9-10  General:  Well appearing, sitting up in chair. No respiratory difficulty HEENT: normal Neck: supple.  9 cm. Carotids 2+ bilat; no bruits. No lymphadenopathy or thyromegaly appreciated. Cor: PMI nondisplaced. Regular rate & rhythm. No rubs, gallops or murmurs.sternotomy site stable Lungs: clear Abdomen: soft, nontender, nondistended. No hepatosplenomegaly. No bruits or masses. Good bowel sounds. Extremities: no cyanosis, clubbing, rash, edema RUE PICC Neuro: alert & oriented x 3, cranial nerves grossly intact. moves all 4 extremities w/o difficulty. Affect  pleasant.   Telemetry   Sinus tach low 100s, occasional PVCs. Personally reviewed  Labs    CBC Recent Labs    03/26/21 0237 03/27/21 0333  WBC 8.6 8.4  HGB 11.2* 11.1*  HCT 33.0* 33.0*  MCV 90.4 88.7  PLT 70* 86*   Basic Metabolic Panel Recent Labs    17/51/02 2127 03/27/21 0333  NA 134* 134*  K 3.8 3.8  CL 94* 94*  CO2 35* 31  GLUCOSE 190* 201*  BUN 18 17  CREATININE 1.36* 1.35*  CALCIUM 8.9 8.6*  MG 2.2 2.2   Liver Function Tests No results for input(s): AST, ALT, ALKPHOS, BILITOT, PROT, ALBUMIN in the last 72 hours. No results for input(s): LIPASE, AMYLASE in the last 72 hours. Cardiac Enzymes No results for input(s): CKTOTAL, CKMB, CKMBINDEX, TROPONINI in the last 72 hours.  BNP: BNP (last 3 results) Recent Labs    02/13/21 1356  BNP 998.0*    ProBNP (last 3 results) Recent Labs    03/14/21 0731  PROBNP 4,235*     D-Dimer No results for input(s): DDIMER in the last 72 hours. Hemoglobin A1C No results for input(s): HGBA1C in the last 72 hours. Fasting Lipid Panel No results for input(s): CHOL, HDL, LDLCALC, TRIG, CHOLHDL, LDLDIRECT in the last 72 hours. Thyroid Function Tests No results for input(s): TSH, T4TOTAL, T3FREE, THYROIDAB in the last 72 hours.  Invalid input(s): FREET3  Other results:   Imaging    [place] EKG SITE RITE  Result Date: 03/26/2021  If [employer] not attached, placement could not be confirmed due to current cardiac rhythm.    Medications:     Scheduled Medications: . acetaminophen  1,000 mg Oral Q6H   Or  . acetaminophen (TYLENOL) oral liquid 160 mg/5 mL  1,000 mg Per Tube Q6H  . aspirin EC  325 mg Oral Daily   Or  . aspirin  324 mg Per Tube Daily  . atorvastatin  80 mg Oral Daily  . bisacodyl  10 mg Oral Daily   Or  . bisacodyl  10 mg Rectal Daily  . Chlorhexidine Gluconate Cloth  6 each Topical Daily  . docusate sodium  200 mg Oral Daily  . enoxaparin (LOVENOX) injection  40 mg Subcutaneous Q24H   . insulin aspart  0-24 Units Subcutaneous Q4H  . lidocaine  2 patch Transdermal Q24H  . pantoprazole  40 mg Oral Daily  . potassium chloride  20 mEq Oral Daily  . sodium chloride flush  10-40 mL Intracatheter Q12H  . sodium chloride flush  3 mL Intravenous Q12H  . torsemide  40 mg Oral Daily    Infusions: . sodium chloride 20 mL/hr at 03/23/21 1753  . sodium chloride    . sodium chloride Stopped (03/24/21 1613)  . DOBUTamine 5 mcg/kg/min (03/27/21 0700)  . insulin Stopped (03/25/21 0803)  . lactated ringers    . lactated ringers    . lactated ringers Stopped (03/23/21 0840)  . milrinone 0.25 mcg/kg/min (03/27/21 0700)  . norepinephrine (LEVOPHED) Adult infusion      PRN Medications: sodium chloride, dextrose, lactated ringers, loratadine, metoprolol tartrate, midazolam, morphine injection, ondansetron (ZOFRAN) IV, oxyCODONE, sodium chloride flush, sodium chloride flush, sodium chloride flush, traMADol   Assessment/Plan   1. CAD: Had MI in 2004 with DESx2 with totally occluded CFX (infarct-related artery) and 80% mRCA stenosis. Patient had Taxus DES to both vessels.  Recently, noted to have EF down to 20-25% on echo, set up for coronary angiography. LHC on 4/11 showed high-grade subtotal stenoses of the LAD, left circumflex, and total occlusion of the previously placed stents in the circumflex marginal and RCA. There are extensive collaterals supplying the distal RCA, and collaterals supplying the obtuse marginal vessel. Collaterals are jeopardized due to the high-grade subtotal stenoses of the LAD and circumflex vessel. There are excellent distal targets. CMRI with substantial viability.  4/13 patient had CABG with LIMA-LAD, SVG-OM, SVG-PDA.  No s/s angina - Continue ASA 81  - Continue atorvastatin 80 daily.  2. Acute systolic CHF: Echo in 4/22 with EF 20-25%, severe LV dilation, mildly dilated RV with normal systolic function, moderate MR. Ischemic cardiomyopathy.  RHC 4/11 with  elevated PCWP and CI 1.7. cMRI with LV EF 15%, RV EF 31%. Dobutamine stopped 4/16 and co-ox fell to 44%. Now on dobutamine 5 + milrinone 0.25. Co-ox 60% today. CVP 9.  - Wean Milrinone today, reduce to 0.125. Continue DBA 5 - continue Torsemide 40 mg daily  - restart digoxin 0.125  3. AKI on CKD stage 3: Creatinine 1.6 on 4/14, bumped to 2.5 on 4/15 in setting of hypotension. Improving with DBA support. Scr now 1.4  - continue inotropes, keep MAPs >65 and follow BMP  4. Hyperlipidemia: Atorvastatin 80 daily.  5. Thrombocytopenia: Platelets mildly low at baseline, ?ITP.  Post-op down to 63>>50K >>39k > (DVT dose lovenox stopped)> 70k>>86K   - PLTs rebounded after stopping low dose lovenox  - HIT panel pending - back on DVT dose lovenox today, hgb stable  Length of Stay: [address], PA-C  03/27/2021, 7:15 AM  Advanced Heart Failure Team Pager [phone] (M-F; 7a - 5p)  Please contact [facility] for night-coverage after hours (5p -7a ) and weekends on [email or web address]  Patient seen with PA, agree with the above note .  Co-ox 60% today with CVP 9-10.  Weight is back close to his baseline.  No complaints.  Remains on dobutamine 5 + milrinone 0.25.   General: NAD Neck: JVP 8 cm, no thyromegaly or thyroid nodule.  Lungs: Clear to auscultation bilaterally with normal respiratory effort. CV: Nondisplaced PMI.  Heart regular S1/S2, no S3/S4, no murmur.  No peripheral edema.   Abdomen: Soft, nontender, no hepatosplenomegaly, no distention.  Skin: Intact without lesions or rashes.  Neurologic: Alert and oriented x 3.  Psych: Normal affect. Extremities: No clubbing or cyanosis.  HEENT: Normal.   Will need slow wean of inotropes (failed rapid wean).  Decrease milrinone to 0.125 today, continue current dobutamine.  I will add digoxin 0.125 and losartan 12.5 daily today.    Volume status near baseline, continue torsemide 40 mg daily.   CRITICAL CARE Performed by: Marca Ancona  Total  critical care time: 35 minutes  Critical care time was exclusive of separately billable procedures and treating other patients.  Critical care was necessary to treat or prevent imminent or life-threatening deterioration.  Critical care was time spent personally by me on the following activities: development of treatment plan with patient and/or surrogate as well as nursing, discussions with consultants, evaluation of patient's response to treatment, examination of patient, obtaining history from patient or surrogate, ordering and performing treatments and interventions, ordering and review of laboratory studies, ordering and review of radiographic studies, pulse oximetry and re-evaluation of patient's condition.   Marca Ancona 03/27/2021 7:50 AM

## 2021-03-27 NOTE — Plan of Care (Signed)

## 2021-03-27 NOTE — Progress Notes (Signed)
5 Days Post-Op Procedure(s) (LRB): CORONARY ARTERY BYPASS GRAFTING (CABG) X  THREE USING LEFT INTERNAL MAMMARY ARTERY AND RIGHT ENDOSCOPIC SAPHENOUS VEIN HARVEST CONDUITS (N/A) TRANSESOPHAGEAL ECHOCARDIOGRAM (TEE) (N/A) Subjective: Feeling better Objective: Vital signs in last 24 hours: Temp:  [97.8 F (36.6 C)-99 F (37.2 C)] 98.9 F (37.2 C) (04/18 0747) Pulse Rate:  [90-116] 101 (04/18 0745) Cardiac Rhythm: Normal sinus rhythm;Sinus tachycardia (04/18 0731) Resp:  [5-28] 28 (04/18 0745) BP: (92-146)/(44-105) 118/68 (04/18 0745) SpO2:  [89 %-100 %] 95 % (04/18 0745)  Hemodynamic parameters for last 24 hours: PAP: (21-34)/(8-18) 32/12 CVP:  [4 mmHg-10 mmHg] 10 mmHg  Intake/Output from previous day: 04/17 0701 - 04/18 0700 In: 722.5 [P.O.:360; I.V.:362.5] Out: 2000 [Urine:2000] Intake/Output this shift: No intake/output data recorded.  General appearance: alert and cooperative Neurologic: intact Heart: regular rate and rhythm, S1, S2 normal, no murmur, click, rub or gallop Lungs: clear to auscultation bilaterally Abdomen: soft, non-tender; bowel sounds normal; no masses,  no organomegaly Extremities: extremities normal, atraumatic, no cyanosis or edema Wound: c/d/i  Lab Results: Recent Labs    03/26/21 0237 03/27/21 0333  WBC 8.6 8.4  HGB 11.2* 11.1*  HCT 33.0* 33.0*  PLT 70* 86*   BMET:  Recent Labs    03/26/21 2127 03/27/21 0333  NA 134* 134*  K 3.8 3.8  CL 94* 94*  CO2 35* 31  GLUCOSE 190* 201*  BUN 18 17  CREATININE 1.36* 1.35*  CALCIUM 8.9 8.6*    PT/INR: No results for input(s): LABPROT, INR in the last 72 hours. ABG    Component Value Date/Time   PHART 7.365 03/22/2021 2111   HCO3 21.1 03/22/2021 2111   TCO2 22 03/22/2021 2111   ACIDBASEDEF 4.0 (H) 03/22/2021 2111   O2SAT 59.9 03/27/2021 0333   CBG (last 3)  Recent Labs    03/26/21 2325 03/27/21 0330 03/27/21 0331  GLUCAP 103* 269* 114*    Assessment/Plan: S/P Procedure(s)  (LRB): CORONARY ARTERY BYPASS GRAFTING (CABG) X  THREE USING LEFT INTERNAL MAMMARY ARTERY AND RIGHT ENDOSCOPIC SAPHENOUS VEIN HARVEST CONDUITS (N/A) TRANSESOPHAGEAL ECHOCARDIOGRAM (TEE) (N/A) Mobilize Diuresis Plan for transfer to step-down: see transfer orders wean milrinone per Dr. Shirlee Latch   LOS: 7 days    Edward [name] 03/27/2021

## 2021-03-28 ENCOUNTER — Inpatient Hospital Stay (HOSPITAL_COMMUNITY): Payer: Medicare Other

## 2021-03-28 ENCOUNTER — Other Ambulatory Visit (HOSPITAL_COMMUNITY): Payer: Self-pay

## 2021-03-28 DIAGNOSIS — I5043 Acute on chronic combined systolic (congestive) and diastolic (congestive) heart failure: Secondary | ICD-10-CM

## 2021-03-28 LAB — CBC
HCT: 34.4 % — ABNORMAL LOW (ref 39.0–52.0)
Hemoglobin: 11.6 g/dL — ABNORMAL LOW (ref 13.0–17.0)
MCH: 29.8 pg (ref 26.0–34.0)
MCHC: 33.7 g/dL (ref 30.0–36.0)
MCV: 88.4 fL (ref 80.0–100.0)
Platelets: 124 10*3/uL — ABNORMAL LOW (ref 150–400)
RBC: 3.89 MIL/uL — ABNORMAL LOW (ref 4.22–5.81)
RDW: 12.8 % (ref 11.5–15.5)
WBC: 8.6 10*3/uL (ref 4.0–10.5)
nRBC: 0 % (ref 0.0–0.2)

## 2021-03-28 LAB — BASIC METABOLIC PANEL
Anion gap: 7 (ref 5–15)
BUN: 17 mg/dL (ref 8–23)
CO2: 33 mmol/L — ABNORMAL HIGH (ref 22–32)
Calcium: 8.9 mg/dL (ref 8.9–10.3)
Chloride: 95 mmol/L — ABNORMAL LOW (ref 98–111)
Creatinine, Ser: 1.49 mg/dL — ABNORMAL HIGH (ref 0.61–1.24)
GFR, Estimated: 52 mL/min — ABNORMAL LOW (ref >60)
Glucose, Bld: 155 mg/dL — ABNORMAL HIGH (ref 70–99)
Potassium: 3.7 mmol/L (ref 3.5–5.1)
Sodium: 135 mmol/L (ref 135–145)

## 2021-03-28 LAB — COOXEMETRY PANEL
Carboxyhemoglobin: 1.5 % (ref 0.5–1.5)
Methemoglobin: 0.9 % (ref 0.0–1.5)
O2 Saturation: 67 %
Total hemoglobin: 11.7 g/dL — ABNORMAL LOW (ref 12.0–16.0)

## 2021-03-28 LAB — GLUCOSE, CAPILLARY
Glucose-Capillary: 94 mg/dL (ref 70–99)
Glucose-Capillary: 105 mg/dL — ABNORMAL HIGH (ref 70–99)

## 2021-03-28 MED ORDER — TRAMADOL HCL 50 MG PO TABS
50.0000 mg | ORAL_TABLET | Freq: Two times a day (BID) | ORAL | Status: DC | PRN
  Administered 2021-03-28 (×2): 50 mg via ORAL
  Filled 2021-03-28 (×2): qty 1

## 2021-03-28 MED ORDER — POTASSIUM CHLORIDE CRYS ER 20 MEQ PO TBCR
40.0000 meq | EXTENDED_RELEASE_TABLET | Freq: Once | ORAL | Status: AC
  Administered 2021-03-28: 40 meq via ORAL
  Filled 2021-03-28: qty 2

## 2021-03-28 MED ORDER — SACUBITRIL-VALSARTAN 24-26 MG PO TABS
1.0000 | ORAL_TABLET | Freq: Two times a day (BID) | ORAL | Status: DC
  Administered 2021-03-28 – 2021-04-01 (×8): 1 via ORAL
  Filled 2021-03-28 (×8): qty 1

## 2021-03-28 NOTE — Plan of Care (Signed)
  Problem: Education: Goal: Knowledge of General Education information will improve Description: Including pain rating scale, medication(s)/side effects and non-pharmacologic comfort measures Outcome: Progressing   Problem: Health Behavior/Discharge Planning: Goal: Ability to manage health-related needs will improve Outcome: Progressing   Problem: Clinical Measurements: Goal: Ability to maintain clinical measurements within normal limits will improve Outcome: Progressing   Problem: Clinical Measurements: Goal: Diagnostic test results will improve Outcome: Progressing   Problem: Clinical Measurements: Goal: Cardiovascular complication will be avoided Outcome: Progressing   Problem: Activity: Goal: Risk for activity intolerance will decrease Outcome: Progressing   Problem: Nutrition: Goal: Adequate nutrition will be maintained Outcome: Progressing   Problem: Pain Managment: Goal: General experience of comfort will improve Outcome: Progressing   Problem: Education: Goal: Understanding of CV disease, CV risk reduction, and recovery process will improve Outcome: Progressing   Problem: Health Behavior/Discharge Planning: Goal: Ability to safely manage health-related needs after discharge will improve Outcome: Progressing

## 2021-03-28 NOTE — Progress Notes (Signed)
CARDIAC REHAB PHASE I   PRE:  Rate/Rhythm: 104 ST    BP: sitting 124/73    SaO2: 95 RA  MODE:  Ambulation: 820 ft   POST:  Rate/Rhythm: 120 ST    BP: sitting 106/79     SaO2: 95 RA  Pt ambulated without significant c/o. Moving independently, I pushed IV pole. HR elevated, BP lower after walk. No c/o. To recliner, encouraged more walking and IS. [phone]   Harriet Masson CES, ACSM 03/28/2021 8:48 AM

## 2021-03-28 NOTE — Discharge Instructions (Signed)
Prediabetes Eating Plan Prediabetes is a condition that causes blood sugar (glucose) levels to be higher than normal. This increases the risk for developing type 2 diabetes (type 2 diabetes mellitus). Working with a health care provider or nutrition specialist (dietitian) to make diet and lifestyle changes can help prevent the onset of diabetes. These changes may help you:  Control your blood glucose levels.  Improve your cholesterol levels.  Manage your blood pressure. What are tips for following this plan? Reading food labels  Read food labels to check the amount of fat, salt (sodium), and sugar in prepackaged foods. Avoid foods that have: ? Saturated fats. ? Trans fats. ? Added sugars.  Avoid foods that have more than 300 milligrams (mg) of sodium per serving. Limit your sodium intake to less than 2,300 mg each day. Shopping  Avoid buying pre-made and processed foods.  Avoid buying drinks with added sugar. Cooking  Cook with olive oil. Do not use butter, lard, or ghee.  Bake, broil, grill, steam, or boil foods. Avoid frying. Meal planning  Work with your dietitian to create an eating plan that is right for you. This may include tracking how many calories you take in each day. Use a food diary, notebook, or mobile application to track what you eat at each meal.  Consider following a Mediterranean diet. This includes: ? Eating several servings of fresh fruits and vegetables each day. ? Eating fish at least twice a week. ? Eating one serving each day of whole grains, beans, nuts, and seeds. ? Using olive oil instead of other fats. ? Limiting alcohol. ? Limiting red meat. ? Using nonfat or low-fat dairy products.  Consider following a plant-based diet. This includes dietary choices that focus on eating mostly vegetables and fruit, grains, beans, nuts, and seeds.  If you have high blood pressure, you may need to limit your sodium intake or follow a diet such as the DASH  (Dietary Approaches to Stop Hypertension) eating plan. The DASH diet aims to lower high blood pressure.   Lifestyle  Set weight loss goals with help from your health care team. It is recommended that most people with prediabetes lose 7% of their body weight.  Exercise for at least 30 minutes 5 or more days a week.  Attend a support group or seek support from a mental health counselor.  Take over-the-counter and prescription medicines only as told by your health care provider. What foods are recommended? Fruits Berries. Bananas. Apples. Oranges. Grapes. Papaya. Mango. Pomegranate. Kiwi. Grapefruit. Cherries. Vegetables Lettuce. Spinach. Peas. Beets. Cauliflower. Cabbage. Broccoli. Carrots. Tomatoes. Squash. Eggplant. Herbs. Peppers. Onions. Cucumbers. Brussels sprouts. Grains Whole grains, such as whole-wheat or whole-grain breads, crackers, cereals, and pasta. Unsweetened oatmeal. Bulgur. Barley. Quinoa. Brown rice. Corn or whole-wheat flour tortillas or taco shells. Meats and other proteins Seafood. Poultry without skin. Lean cuts of pork and beef. Tofu. Eggs. Nuts. Beans. Dairy Low-fat or fat-free dairy products, such as yogurt, cottage cheese, and cheese. Beverages Water. Tea. Coffee. Sugar-free or diet soda. Seltzer water. Low-fat or nonfat milk. Milk alternatives, such as soy or almond milk. Fats and oils Olive oil. Canola oil. Sunflower oil. Grapeseed oil. Avocado. Walnuts. Sweets and desserts Sugar-free or low-fat pudding. Sugar-free or low-fat ice cream and other frozen treats. Seasonings and condiments Herbs. Sodium-free spices. Mustard. Relish. Low-salt, low-sugar ketchup. Low-salt, low-sugar barbecue sauce. Low-fat or fat-free mayonnaise. The items listed above may not be a complete list of recommended foods and beverages. Contact a dietitian for more   information. What foods are not recommended? Fruits Fruits canned with syrup. Vegetables Canned vegetables. Frozen  vegetables with butter or cream sauce. Grains Refined white flour and flour products, such as bread, pasta, snack foods, and cereals. Meats and other proteins Fatty cuts of meat. Poultry with skin. Breaded or fried meat. Processed meats. Dairy Full-fat yogurt, cheese, or milk. Beverages Sweetened drinks, such as iced tea and soda. Fats and oils Butter. Lard. Ghee. Sweets and desserts Baked goods, such as cake, cupcakes, pastries, cookies, and cheesecake. Seasonings and condiments Spice mixes with added salt. Ketchup. Barbecue sauce. Mayonnaise. The items listed above may not be a complete list of foods and beverages that are not recommended. Contact a dietitian for more information. Where to find more information  American Diabetes Association: www.diabetes.org Summary  You may need to make diet and lifestyle changes to help prevent the onset of diabetes. These changes can help you control blood sugar, improve cholesterol levels, and manage blood pressure.  Set weight loss goals with help from your health care team. It is recommended that most people with prediabetes lose 7% of their body weight.  Consider following a Mediterranean diet. This includes eating plenty of fresh fruits and vegetables, whole grains, beans, nuts, seeds, fish, and low-fat dairy, and using olive oil instead of other fats. This information is not intended to replace advice given to you by your health care provider. Make sure you discuss any questions you have with your health care provider. Document Revised: 02/25/2020 Document Reviewed: 02/25/2020 Elsevier Patient Education  2021 Elsevier Inc.   Discharge Instructions:  1. You may shower, please wash incisions daily with soap and water and keep dry.  If you wish to cover wounds with dressing you may do so but please keep clean and change daily.  No tub baths or swimming until incisions have completely healed.  If your incisions become red or develop any  drainage please call our office at [phone]  2. No Driving until cleared by Dr. Lucilla Lame office and you are no longer using narcotic pain medications  3. Monitor your weight daily.. Please use the same scale and weigh at same time... If you gain 5-10 lbs in 48 hours with associated lower extremity swelling, please contact our office at [phone]  4. Fever of 101.5 for at least 24 hours with no source, please contact our office at [phone]  5. Activity- up as tolerated, please walk at least 3 times per day.  Avoid strenuous activity, no lifting, pushing, or pulling with your arms over 8-10 lbs for a minimum of 6 weeks  6. If any questions or concerns arise, please do not hesitate to contact our office at [phone]

## 2021-03-28 NOTE — Plan of Care (Signed)

## 2021-03-28 NOTE — Progress Notes (Signed)
Came to ambulate pt again however he just got back from walking with his wife. Feeling well. Encouraged IS and another walk later. Ethelda Chick CES, ACSM 1:36 PM 03/28/2021

## 2021-03-28 NOTE — Progress Notes (Addendum)
      [address]       [place] [address]             [phone]        6 Days Post-Op Procedure(s) (LRB): CORONARY ARTERY BYPASS GRAFTING (CABG) X  THREE USING LEFT INTERNAL MAMMARY ARTERY AND RIGHT ENDOSCOPIC SAPHENOUS VEIN HARVEST CONDUITS (N/A) TRANSESOPHAGEAL ECHOCARDIOGRAM (TEE) (N/A)  Subjective: Patient states getting loose stools.  Objective: Vital signs in last 24 hours: Temp:  [98.3 F (36.8 C)-98.9 F (37.2 C)] 98.7 F (37.1 C) (04/19 0300) Pulse Rate:  [92-104] 104 (04/19 0300) Cardiac Rhythm: Normal sinus rhythm (04/19 0400) Resp:  [10-28] 18 (04/19 0300) BP: (105-137)/(58-77) 127/75 (04/19 0300) SpO2:  [94 %-100 %] 98 % (04/19 0300) Weight:  [91.8 kg] 91.8 kg (04/19 0300)  Pre op weight 89.9 kg Current Weight  03/28/21 91.8 kg    Hemodynamic parameters for last 24 hours: CVP:  [5 mmHg-10 mmHg] 5 mmHg  Intake/Output from previous day: 04/18 0701 - 04/19 0700 In: 318.7 [P.O.:60; I.V.:258.7] Out: 1950 [Urine:1950]   Physical Exam:  Cardiovascular: Slightly tachycardic Pulmonary: Clear to auscultation bilaterally Abdomen: Soft, non tender, bowel sounds present. Extremities: Ted hose bilaterally Wounds: Clean and dry.  No erythema or signs of infection.  Lab Results: CBC: Recent Labs    03/27/21 0333 03/28/21 0357  WBC 8.4 8.6  HGB 11.1* 11.6*  HCT 33.0* 34.4*  PLT 86* 124*   BMET:  Recent Labs    03/27/21 0333 03/28/21 0357  NA 134* 135  K 3.8 3.7  CL 94* 95*  CO2 31 33*  GLUCOSE 201* 155*  BUN 17 17  CREATININE 1.35* 1.49*  CALCIUM 8.6* 8.9    PT/INR:  Lab Results  Component Value Date   INR 1.5 (H) 03/22/2021   INR 1.1 03/21/2021   ABG:  INR: Will add last result for INR, ABG once components are confirmed Will add last 4 CBG results once components are confirmed  Assessment/Plan:  1. CV - SR, ST at times. Co ox this am increased to 67. On Milrinone and Dobutamine drips. Also, on Digoxin 0.125 mg  daily. Started on Mobridge today. Heart failure following;Milrinone likely to be stopped today. 2.  Pulmonary - On room air. CXR this am shows cardiomegaly, small left pleural effusion, bibasilar edema. Encourage incentive spirometer. 3. Volume Overload - He was on Torsemide 40 mg daily but this was stopped this am as volume status is improved.  4.  Expected post op acute blood loss anemia - H and H stable at 11.6 and 34.4 5. CBGs 97/95/94. Pre op HGA1C 6.4. He has pre diabetes and is borderline diabetic. Will stop accu checks and SS PRN. Will have him follow up with medical doctor after discharge. 6. Thrombocytopenia-platelets increased to 124,000. On Lovenox for DVT prophylaxis 7. AKI on CKD (stage III)-Creatinine slightly increased from 1.35 to 1.49. 8. Gently supplement potasium 9. Stop stool softeners  Donielle M ZimmermanPA-C 03/28/2021,7:19 AM Patient seen and examined, agree with above AHF stopped milrinone, dobutamine down to 4 HIT negative, PLT count ok  Viviann Spare C. Dorris Fetch, MD [facility] [phone]

## 2021-03-28 NOTE — Progress Notes (Addendum)
Advanced Heart Failure Rounding Note  PCP-Cardiologist: Donato Schultz, MD   Subjective:    4/13 S/P CABG x3, now extubated.   Remains on dual inotropes, DBA 5+ milrinone 0.125. Co-ox 67%.   Volume status low, wt below pre-op wt. CVP 2.  BP stable w/ losartan. Slight bump in Scr 1.35>>1.49.   Pts trending back up, nadir 39>>70>>86>>124. HIT panel negative. Hgb stable at 11.   Sitting up in bed. No complaints. Using IS.   Cardiac MRI: 1. Moderately dilated LV with EF 15%, global hypokinesis with lateral akinesis. 2.  Normal RV size with EF 31% 3. LGE pattern suggests substantial viability (LGE noted primarily in lateral wall, <50% wall thickness)   Objective:   Weight Range: 91.8 kg Body mass index is 29.89 kg/m.   Vital Signs:   Temp:  [98.3 F (36.8 C)-98.9 F (37.2 C)] 98.7 F (37.1 C) (04/19 0300) Pulse Rate:  [92-104] 104 (04/19 0300) Resp:  [10-28] 18 (04/19 0300) BP: (105-137)/(58-77) 127/75 (04/19 0300) SpO2:  [94 %-100 %] 98 % (04/19 0300) Weight:  [91.8 kg] 91.8 kg (04/19 0300) Last BM Date: 03/26/21  Weight change: Filed Weights   03/25/21 1900 03/26/21 0500 03/28/21 0300  Weight: 96.3 kg 93.8 kg 91.8 kg    Intake/Output:   Intake/Output Summary (Last 24 hours) at 03/28/2021 0713 Last data filed at 03/28/2021 0400 Gross per 24 hour  Intake 286.32 ml  Output 1950 ml  Net -1663.68 ml      Physical Exam    CVP 2  General:  Well appearing. No respiratory difficulty HEENT: normal Neck: supple. no JVD. Carotids 2+ bilat; no bruits. No lymphadenopathy or thyromegaly appreciated. Cor: PMI nondisplaced. Regular rhythm, mildly tachy rate. No rubs, gallops or murmurs. Lungs: clear Abdomen: soft, nontender, nondistended. No hepatosplenomegaly. No bruits or masses. Good bowel sounds. Extremities: no cyanosis, clubbing, rash, edema + RUE PICC  Neuro: alert & oriented x 3, cranial nerves grossly intact. moves all 4 extremities w/o difficulty. Affect  pleasant.   Telemetry   Sinus tach low 100s-110s. Personally reviewed  Labs    CBC Recent Labs    03/27/21 0333 03/28/21 0357  WBC 8.4 8.6  HGB 11.1* 11.6*  HCT 33.0* 34.4*  MCV 88.7 88.4  PLT 86* 124*   Basic Metabolic Panel Recent Labs    69/62/95 2127 03/27/21 0333 03/28/21 0357  NA 134* 134* 135  K 3.8 3.8 3.7  CL 94* 94* 95*  CO2 35* 31 33*  GLUCOSE 190* 201* 155*  BUN 18 17 17   CREATININE 1.36* 1.35* 1.49*  CALCIUM 8.9 8.6* 8.9  MG 2.2 2.2  --    Liver Function Tests No results for input(s): AST, ALT, ALKPHOS, BILITOT, PROT, ALBUMIN in the last 72 hours. No results for input(s): LIPASE, AMYLASE in the last 72 hours. Cardiac Enzymes No results for input(s): CKTOTAL, CKMB, CKMBINDEX, TROPONINI in the last 72 hours.  BNP: BNP (last 3 results) Recent Labs    02/13/21 1356  BNP 998.0*    ProBNP (last 3 results) Recent Labs    03/14/21 0731  PROBNP 4,235*     D-Dimer No results for input(s): DDIMER in the last 72 hours. Hemoglobin A1C No results for input(s): HGBA1C in the last 72 hours. Fasting Lipid Panel No results for input(s): CHOL, HDL, LDLCALC, TRIG, CHOLHDL, LDLDIRECT in the last 72 hours. Thyroid Function Tests No results for input(s): TSH, T4TOTAL, T3FREE, THYROIDAB in the last 72 hours.  Invalid input(s):  FREET3  Other results:   Imaging    DG Chest 2 View  Result Date: 03/28/2021 CLINICAL DATA:  Open-heart surgery. EXAM: CHEST - 2 VIEW COMPARISON:  03/27/2021. FINDINGS: PICC line noted stable position. Prior CABG. Cardiomegaly. Persistent low lung volumes with bibasilar and infiltrates/edema. Small left pleural effusion again noted. No interim change. No pneumothorax. IMPRESSION: 1.  PICC line noted stable position. 2.  Prior CABG.  Stable cardiomegaly. 3. Persistent mild bibasilar and infiltrates/edema. Small left pleural effusion again noted. Electronically Signed   By: Maisie Fus  Register   On: 03/28/2021 07:05      Medications:     Scheduled Medications: . aspirin EC  325 mg Oral Daily   Or  . aspirin  324 mg Per Tube Daily  . atorvastatin  80 mg Oral Daily  . bisacodyl  10 mg Oral Daily   Or  . bisacodyl  10 mg Rectal Daily  . Chlorhexidine Gluconate Cloth  6 each Topical Daily  . digoxin  0.125 mg Oral Daily  . docusate sodium  200 mg Oral Daily  . enoxaparin (LOVENOX) injection  40 mg Subcutaneous Q24H  . insulin aspart  0-24 Units Subcutaneous TID AC & HS  . lidocaine  2 patch Transdermal Q24H  . losartan  12.5 mg Oral Daily  . pantoprazole  40 mg Oral Daily  . potassium chloride  20 mEq Oral Daily  . torsemide  40 mg Oral Daily    Infusions: . DOBUTamine 5 mcg/kg/min (03/28/21 0400)  . insulin Stopped (03/25/21 0803)  . lactated ringers    . lactated ringers    . milrinone 0.125 mcg/kg/min (03/28/21 0400)    PRN Medications: dextrose, lactated ringers, loratadine, metoprolol tartrate, morphine injection, ondansetron (ZOFRAN) IV, oxyCODONE, sodium chloride flush, traMADol   Assessment/Plan   1. CAD: Had MI in 2004 with DESx2 with totally occluded CFX (infarct-related artery) and 80% mRCA stenosis. Patient had Taxus DES to both vessels.  Recently, noted to have EF down to 20-25% on echo, set up for coronary angiography. LHC on 4/11 showed high-grade subtotal stenoses of the LAD, left circumflex, and total occlusion of the previously placed stents in the circumflex marginal and RCA. There are extensive collaterals supplying the distal RCA, and collaterals supplying the obtuse marginal vessel. Collaterals are jeopardized due to the high-grade subtotal stenoses of the LAD and circumflex vessel. There are excellent distal targets. CMRI with substantial viability.  4/13 patient had CABG with LIMA-LAD, SVG-OM, SVG-PDA.  No s/s angina - Continue ASA 81  - Continue atorvastatin 80 daily.  2. Acute systolic CHF: Echo in 4/22 with EF 20-25%, severe LV dilation, mildly dilated RV  with normal systolic function, moderate MR. Ischemic cardiomyopathy.  RHC 4/11 with elevated PCWP and CI 1.7. cMRI with LV EF 15%, RV EF 31%. Dobutamine stopped 4/16 and co-ox fell to 44%. Now on dobutamine 5 + milrinone 0.125. Co-ox 67% today. CVP low at 2.  - Stop Milrinone today, decrease dobutamine to 4.  - hold torsemide w/ low CVP  - continue digoxin 0.125  - BP better, can stop losartan and start Entresto 24/26 bid. 3. AKI on CKD stage 3: Creatinine 1.6 on 4/14, bumped to 2.5 on 4/15 in setting of hypotension. Improving with DBA support. Scr now 1.46.  - No diuretic today with low CVP.  4. Hyperlipidemia: Atorvastatin 80 daily.  5. Thrombocytopenia: Platelets mildly low at baseline, ?ITP.  Post-op down to 63>>50K >>39k > (DVT dose lovenox stopped)> 70k>>86>>124K.  HIT negative.  -  back on DVT dose lovenox today, hgb stable  Mobilize.   Length of Stay: [address], PA-C  03/28/2021, 7:13 AM  Advanced Heart Failure Team Pager [phone] (M-F; 7a - 5p)  Please contact [facility] for night-coverage after hours (5p -7a ) and weekends on [email or web address]  Patient seen with PA, agree with the above note.   Co-ox 67%, CVP < 5.  SBP 120s.  Feels better.   General: NAD Neck: No JVD, no thyromegaly or thyroid nodule.  Lungs: Clear to auscultation bilaterally with normal respiratory effort. CV: Nondisplaced PMI.  Heart regular S1/S2, +S3 and friction rub, no murmur.  No peripheral edema.   Abdomen: Soft, nontender, no hepatosplenomegaly, no distention.  Skin: Intact without lesions or rashes.  Neurologic: Alert and oriented x 3.  Psych: Normal affect. Extremities: No clubbing or cyanosis.  HEENT: Normal.   Post-op friction rub, denies chest pain.  Follow.   Good co-ox, stop milrinone and decrease dobutamine to 4 (slow titration down).  Can transition from losartan to Entresto 24/26 bid and continue digoxin.   Low CVP, no torsemide today.   Platelets have recovered, HIT  negative.   Marca Ancona 03/28/2021 7:38 AM

## 2021-03-28 NOTE — TOC Benefit Eligibility Note (Signed)
Patient Teacher, English as a foreign language completed.    The patient is currently admitted and upon discharge could be taking Entresto 16m/26mg.  The current 30 day co-pay is, $142.00 due to a $95.00 deductible, once deductible is met the co-pay will be $47.00.   The patient is currently admitted and upon discharge could be taking Farxiga 120m  The current 30 day co-pay is, $142.00 due to a $95.00 deductible, once deductible is met the co-pay will be $47.00.   The patient is currently admitted and upon discharge could be taking Jardiance 10 mg.  The current 30 day co-pay is, $142.00 due to a $95.00 deductible, once deductible is met the co-pay will be $47.00.   The patient is [place] through [employer] Direct Number: [phone]: [phone]

## 2021-03-28 NOTE — Care Management Important Message (Signed)
Important Message  Patient Details  Name: Edward [name] MRN: [id number] Date of Birth: Apr 15, 1956   Medicare Important Message Given:  Yes     Cameren Odwyer Stefan Church 03/28/2021, 3:51 PM

## 2021-03-29 DIAGNOSIS — I5021 Acute systolic (congestive) heart failure: Secondary | ICD-10-CM | POA: Diagnosis not present

## 2021-03-29 LAB — CBC
HCT: 38 % — ABNORMAL LOW (ref 39.0–52.0)
Hemoglobin: 12.5 g/dL — ABNORMAL LOW (ref 13.0–17.0)
MCH: 29.4 pg (ref 26.0–34.0)
MCHC: 32.9 g/dL (ref 30.0–36.0)
MCV: 89.4 fL (ref 80.0–100.0)
Platelets: 154 10*3/uL (ref 150–400)
RBC: 4.25 MIL/uL (ref 4.22–5.81)
RDW: 12.5 % (ref 11.5–15.5)
WBC: 8.7 10*3/uL (ref 4.0–10.5)
nRBC: 0 % (ref 0.0–0.2)

## 2021-03-29 LAB — BASIC METABOLIC PANEL
Anion gap: 7 (ref 5–15)
BUN: 15 mg/dL (ref 8–23)
CO2: 29 mmol/L (ref 22–32)
Calcium: 8.9 mg/dL (ref 8.9–10.3)
Chloride: 100 mmol/L (ref 98–111)
Creatinine, Ser: 1.22 mg/dL (ref 0.61–1.24)
GFR, Estimated: >60 mL/min (ref >60)
Glucose, Bld: 149 mg/dL — ABNORMAL HIGH (ref 70–99)
Potassium: 4.1 mmol/L (ref 3.5–5.1)
Sodium: 136 mmol/L (ref 135–145)

## 2021-03-29 LAB — COOXEMETRY PANEL
Carboxyhemoglobin: 1.2 % (ref 0.5–1.5)
Methemoglobin: 1 % (ref 0.0–1.5)
O2 Saturation: 62.4 %
Total hemoglobin: 13 g/dL (ref 12.0–16.0)

## 2021-03-29 MED ORDER — SPIRONOLACTONE 12.5 MG HALF TABLET
12.5000 mg | ORAL_TABLET | Freq: Every day | ORAL | Status: DC
  Administered 2021-03-29: 12.5 mg via ORAL
  Filled 2021-03-29: qty 1

## 2021-03-29 MED ORDER — DOBUTAMINE IN D5W 4-5 MG/ML-% IV SOLN
1.0000 ug/kg/min | INTRAVENOUS | Status: DC
  Administered 2021-03-29 (×2): 2 ug/kg/min via INTRAVENOUS
  Filled 2021-03-29: qty 250

## 2021-03-29 NOTE — Plan of Care (Signed)
  Problem: Education: Goal: Knowledge of General Education information will improve Description: Including pain rating scale, medication(s)/side effects and non-pharmacologic comfort measures Outcome: Progressing   Problem: Health Behavior/Discharge Planning: Goal: Ability to manage health-related needs will improve Outcome: Progressing   Problem: Clinical Measurements: Goal: Ability to maintain clinical measurements within normal limits will improve Outcome: Progressing   Problem: Clinical Measurements: Goal: Diagnostic test results will improve Outcome: Progressing   Problem: Clinical Measurements: Goal: Cardiovascular complication will be avoided Outcome: Progressing   Problem: Activity: Goal: Risk for activity intolerance will decrease Outcome: Progressing   Problem: Nutrition: Goal: Adequate nutrition will be maintained Outcome: Progressing   Problem: Pain Managment: Goal: General experience of comfort will improve Outcome: Progressing   Problem: Skin Integrity: Goal: Risk for impaired skin integrity will decrease Outcome: Progressing   Problem: Education: Goal: Understanding of CV disease, CV risk reduction, and recovery process will improve Outcome: Progressing   Problem: Activity: Goal: Ability to return to baseline activity level will improve Outcome: Progressing   Problem: Health Behavior/Discharge Planning: Goal: Ability to safely manage health-related needs after discharge will improve Outcome: Progressing

## 2021-03-29 NOTE — Progress Notes (Signed)
Patient ID: Edward [name], male   DOB: 1956/12/01, 65 y.o.   MRN: [id number]      Advanced Heart Failure Rounding Note  PCP-Cardiologist: Donato Schultz, MD   Subjective:    4/13 S/P CABG x3, now extubated.   CVP not hooked up today.  Co-ox 62% on dobutamine 4.   Walked this morning, no dyspnea or chest pain.   Cardiac MRI: 1. Moderately dilated LV with EF 15%, global hypokinesis with lateral akinesis. 2.  Normal RV size with EF 31% 3. LGE pattern suggests substantial viability (LGE noted primarily in lateral wall, <50% wall thickness)   Objective:   Weight Range: 89.3 kg Body mass index is 29.07 kg/m.   Vital Signs:   Temp:  [98.1 F (36.7 C)-98.6 F (37 C)] 98.1 F (36.7 C) (04/20 0803) Pulse Rate:  [85-99] 85 (04/20 0803) Resp:  [14-20] 16 (04/20 0803) BP: (104-135)/(60-75) 104/60 (04/20 0803) SpO2:  [96 %-100 %] 98 % (04/20 0803) Weight:  [89.3 kg] 89.3 kg (04/20 0410) Last BM Date: 03/27/21  Weight change: Filed Weights   03/26/21 0500 03/28/21 0300 03/29/21 0410  Weight: 93.8 kg 91.8 kg 89.3 kg    Intake/Output:   Intake/Output Summary (Last 24 hours) at 03/29/2021 1022 Last data filed at 03/29/2021 0804 Gross per 24 hour  Intake 1084.88 ml  Output 2100 ml  Net -1015.12 ml      Physical Exam    General: NAD Neck: No JVD, no thyromegaly or thyroid nodule.  Lungs: Clear to auscultation bilaterally with normal respiratory effort. CV: Nondisplaced PMI.  Heart regular S1/S2, no S3/S4, no murmur.  No peripheral edema.   Abdomen: Soft, nontender, no hepatosplenomegaly, no distention.  Skin: Intact without lesions or rashes.  Neurologic: Alert and oriented x 3.  Psych: Normal affect. Extremities: No clubbing or cyanosis.  HEENT: Normal.    Telemetry   Sinus tach 100s. Personally reviewed  Labs    CBC Recent Labs    03/28/21 0357 03/29/21 0340  WBC 8.6 8.7  HGB 11.6* 12.5*  HCT 34.4* 38.0*  MCV 88.4 89.4  PLT 124* 154   Basic Metabolic  Panel Recent Labs    03/26/21 2127 03/27/21 0333 03/28/21 0357 03/29/21 0340  NA 134* 134* 135 136  K 3.8 3.8 3.7 4.1  CL 94* 94* 95* 100  CO2 35* 31 33* 29  GLUCOSE 190* 201* 155* 149*  BUN 18 17 17 15   CREATININE 1.36* 1.35* 1.49* 1.22  CALCIUM 8.9 8.6* 8.9 8.9  MG 2.2 2.2  --   --    Liver Function Tests No results for input(s): AST, ALT, ALKPHOS, BILITOT, PROT, ALBUMIN in the last 72 hours. No results for input(s): LIPASE, AMYLASE in the last 72 hours. Cardiac Enzymes No results for input(s): CKTOTAL, CKMB, CKMBINDEX, TROPONINI in the last 72 hours.  BNP: BNP (last 3 results) Recent Labs    02/13/21 1356  BNP 998.0*    ProBNP (last 3 results) Recent Labs    03/14/21 0731  PROBNP 4,235*     D-Dimer No results for input(s): DDIMER in the last 72 hours. Hemoglobin A1C No results for input(s): HGBA1C in the last 72 hours. Fasting Lipid Panel No results for input(s): CHOL, HDL, LDLCALC, TRIG, CHOLHDL, LDLDIRECT in the last 72 hours. Thyroid Function Tests No results for input(s): TSH, T4TOTAL, T3FREE, THYROIDAB in the last 72 hours.  Invalid input(s): FREET3  Other results:   Imaging    No results found.   Medications:  Scheduled Medications: . aspirin EC  325 mg Oral Daily   Or  . aspirin  324 mg Per Tube Daily  . atorvastatin  80 mg Oral Daily  . Chlorhexidine Gluconate Cloth  6 each Topical Daily  . digoxin  0.125 mg Oral Daily  . enoxaparin (LOVENOX) injection  40 mg Subcutaneous Q24H  . lidocaine  2 patch Transdermal Q24H  . pantoprazole  40 mg Oral Daily  . sacubitril-valsartan  1 tablet Oral BID  . spironolactone  12.5 mg Oral Daily    Infusions: . DOBUTamine    . lactated ringers      PRN Medications: dextrose, lactated ringers, loratadine, metoprolol tartrate, morphine injection, ondansetron (ZOFRAN) IV, oxyCODONE, sodium chloride flush, traMADol   Assessment/Plan   1. CAD: Had MI in 2004 with DESx2 with totally  occluded CFX (infarct-related artery) and 80% mRCA stenosis. Patient had Taxus DES to both vessels.  Recently, noted to have EF down to 20-25% on echo, set up for coronary angiography. LHC on 4/11 showed high-grade subtotal stenoses of the LAD, left circumflex, and total occlusion of the previously placed stents in the circumflex marginal and RCA. There are extensive collaterals supplying the distal RCA, and collaterals supplying the obtuse marginal vessel. Collaterals are jeopardized due to the high-grade subtotal stenoses of the LAD and circumflex vessel. There are excellent distal targets. CMRI with substantial viability.  4/13 patient had CABG with LIMA-LAD, SVG-OM, SVG-PDA.  No s/s angina - Continue ASA 81  - Continue atorvastatin 80 daily.  2. Acute systolic CHF: Echo in 4/22 with EF 20-25%, severe LV dilation, mildly dilated RV with normal systolic function, moderate MR. Ischemic cardiomyopathy.  RHC 4/11 with elevated PCWP and CI 1.7. cMRI with LV EF 15%, RV EF 31%. Co-ox 62% today on dobutamine 4.  He is not volume overloaded on exam.  CVP not set up.   - Decrease dobutamine to 2 today, hopefully off tomorrow.  - No diuretics today.  Set up CVP.  - continue digoxin 0.125  - Continue Entresto 24/26 bid. - Add spironolactone 12.5 daily.  3. AKI on CKD stage 3: Creatinine 1.6 on 4/14, bumped to 2.5 on 4/15 in setting of hypotension. Improving with DBA support. Scr now 1.22.   4. Hyperlipidemia: Atorvastatin 80 daily.  5. Thrombocytopenia: Platelets mildly low at baseline, ?ITP.  Post-op down to 63>>50K >>39k > (DVT dose lovenox stopped)> 70k>>86>>124>>154K.  HIT negative.  - back on DVT dose lovenox  Mobilize.   Length of Stay: 9  Marca Ancona, MD  03/29/2021, 10:22 AM  Advanced Heart Failure Team Pager [phone] (M-F; 7a - 5p)  Please contact [facility] for night-coverage after hours (5p -7a ) and weekends on [email or web address]

## 2021-03-29 NOTE — Progress Notes (Signed)
CARDIAC REHAB PHASE I   PRE:  Rate/Rhythm: 96 SR    BP: sitting 9468    SaO2: 94 RA  MODE:  Ambulation: 800 ft   POST:  Rate/Rhythm: 116 ST    BP: sitting 103/64     SaO2: 95 RA  Pt ambulated hall independently. Feels well, no c/o. Asking when he can go home. Left with NT who is setting up his bath for him to do. [phone]   Harriet Masson CES, ACSM 03/29/2021 9:49 AM

## 2021-03-29 NOTE — Progress Notes (Addendum)
      [address]       [place] [address]             [phone]        7 Days Post-Op Procedure(s) (LRB): CORONARY ARTERY BYPASS GRAFTING (CABG) X  THREE USING LEFT INTERNAL MAMMARY ARTERY AND RIGHT ENDOSCOPIC SAPHENOUS VEIN HARVEST CONDUITS (N/A) TRANSESOPHAGEAL ECHOCARDIOGRAM (TEE) (N/A)  Subjective: Patient has no specific complaint this am.  Objective: Vital signs in last 24 hours: Temp:  [98 F (36.7 C)-98.6 F (37 C)] 98.1 F (36.7 C) (04/20 0410) Pulse Rate:  [90-107] 94 (04/20 0410) Cardiac Rhythm: Normal sinus rhythm (04/20 0410) Resp:  [14-20] 20 (04/20 0410) BP: (104-135)/(64-79) 109/73 (04/20 0410) SpO2:  [95 %-100 %] 100 % (04/20 0410) Weight:  [89.3 kg] 89.3 kg (04/20 0410)  Pre op weight 89.9 kg Current Weight  03/29/21 89.3 kg    Hemodynamic parameters for last 24 hours: CVP:  [2 mmHg-3 mmHg] 2 mmHg  Intake/Output from previous day: 04/19 0701 - 04/20 0700 In: 1324.9 [P.O.:1200; I.V.:124.9] Out: 2100 [Urine:2100]   Physical Exam:  Cardiovascular: RRR Pulmonary: Clear to auscultation bilaterally Abdomen: Soft, non tender, bowel sounds present. Extremities: Ted hose bilaterally Wounds: Clean and dry.  No erythema or signs of infection.  Lab Results: CBC: Recent Labs    03/28/21 0357 03/29/21 0340  WBC 8.6 8.7  HGB 11.6* 12.5*  HCT 34.4* 38.0*  PLT 124* 154   BMET:  Recent Labs    03/28/21 0357 03/29/21 0340  NA 135 136  K 3.7 4.1  CL 95* 100  CO2 33* 29  GLUCOSE 155* 149*  BUN 17 15  CREATININE 1.49* 1.22  CALCIUM 8.9 8.9    PT/INR:  Lab Results  Component Value Date   INR 1.5 (H) 03/22/2021   INR 1.1 03/21/2021   ABG:  INR: Will add last result for INR, ABG once components are confirmed Will add last 4 CBG results once components are confirmed  Assessment/Plan:  1. CV - SR, ST at times. Co ox this am slightly decreased to 62.4 (Milrinone stopped yesterday). On  Dobutamine drip (4 mcg/kg/min).  Also, on Digoxin 0.125 mg daily and Entresto bid. Heart failure following 2.  Pulmonary - On room air. CXR this am shows cardiomegaly, small left pleural effusion, bibasilar edema. Encourage incentive spirometer. 3.  Expected post op acute blood loss anemia - H and H stable at 12.5 and 38 4. Thrombocytopenia resolved-platelets increased to 154,000. HIT was negative. On Lovenox for DVT prophylaxis 5. AKI on CKD (stage III)-Creatinine slightly decreased from 1.49 to 1.22.   Donielle M ZimmermanPA-C 03/29/2021,7:19 AM Patient seen and examined, agree with above AHF team managing dobutamine Renal function better  Viviann Spare C. Dorris Fetch, MD [facility] [phone]

## 2021-03-30 DIAGNOSIS — I5043 Acute on chronic combined systolic (congestive) and diastolic (congestive) heart failure: Secondary | ICD-10-CM | POA: Diagnosis not present

## 2021-03-30 LAB — BASIC METABOLIC PANEL
Anion gap: 5 (ref 5–15)
BUN: 15 mg/dL (ref 8–23)
CO2: 27 mmol/L (ref 22–32)
Calcium: 8.9 mg/dL (ref 8.9–10.3)
Chloride: 105 mmol/L (ref 98–111)
Creatinine, Ser: 1.82 mg/dL — ABNORMAL HIGH (ref 0.61–1.24)
GFR, Estimated: 41 mL/min — ABNORMAL LOW (ref >60)
Glucose, Bld: 133 mg/dL — ABNORMAL HIGH (ref 70–99)
Potassium: 4.1 mmol/L (ref 3.5–5.1)
Sodium: 137 mmol/L (ref 135–145)

## 2021-03-30 LAB — COOXEMETRY PANEL
Carboxyhemoglobin: 1.2 % (ref 0.5–1.5)
Methemoglobin: 1 % (ref 0.0–1.5)
O2 Saturation: 63.7 %
Total hemoglobin: 13 g/dL (ref 12.0–16.0)

## 2021-03-30 MED ORDER — ASPIRIN 325 MG PO TBEC: 325.0000 mg | DELAYED_RELEASE_TABLET | Freq: Every day | ORAL | 0 refills | Status: DC

## 2021-03-30 NOTE — Progress Notes (Addendum)
      [address]       [place] [address]             [phone]        8 Days Post-Op Procedure(s) (LRB): CORONARY ARTERY BYPASS GRAFTING (CABG) X  THREE USING LEFT INTERNAL MAMMARY ARTERY AND RIGHT ENDOSCOPIC SAPHENOUS VEIN HARVEST CONDUITS (N/A) TRANSESOPHAGEAL ECHOCARDIOGRAM (TEE) (N/A)  Subjective: Patient has no specific complaint this am. He is curious about when he will get to go home.  Objective: Vital signs in last 24 hours: Temp:  [97.6 F (36.4 C)-98.6 F (37 C)] 98.3 F (36.8 C) (04/21 0405) Pulse Rate:  [85-100] 100 (04/21 0405) Cardiac Rhythm: Normal sinus rhythm (04/21 0405) Resp:  [16-21] 17 (04/21 0405) BP: (104-130)/(60-75) 106/60 (04/21 0405) SpO2:  [92 %-98 %] 96 % (04/21 0405) Weight:  [88.8 kg] 88.8 kg (04/21 0405)  Pre op weight 89.9 kg Current Weight  03/30/21 88.8 kg      Intake/Output from previous day: 04/20 0701 - 04/21 0700 In: 563.7 [P.O.:480; I.V.:83.7] Out: 1875 [Urine:1875]   Physical Exam:  Cardiovascular: RRR, murmur Pulmonary: Clear to auscultation bilaterally Abdomen: Soft, non tender, bowel sounds present. Extremities: Ted hose bilaterally Wounds: Clean and dry.  No erythema or signs of infection.  Lab Results: CBC: Recent Labs    03/28/21 0357 03/29/21 0340  WBC 8.6 8.7  HGB 11.6* 12.5*  HCT 34.4* 38.0*  PLT 124* 154   BMET:  Recent Labs    03/29/21 0340 03/30/21 0302  NA 136 137  K 4.1 4.1  CL 100 105  CO2 29 27  GLUCOSE 149* 133*  BUN 15 15  CREATININE 1.22 1.82*  CALCIUM 8.9 8.9    PT/INR:  Lab Results  Component Value Date   INR 1.5 (H) 03/22/2021   INR 1.1 03/21/2021   ABG:  INR: Will add last result for INR, ABG once components are confirmed Will add last 4 CBG results once components are confirmed  Assessment/Plan:  1. CV - SR, ST at times. Co ox this am stable at 63.7  (Milrinone has been off for a few days). On  Dobutamine drip (2 mcg/kg/min). Also, on Digoxin  0.125 mg daily,  Entresto bid, and Spironolactone 12.5 mg daily. Hope to wean Dobutamine drip off today;heart failure following 2.  Pulmonary - On room air. CXR this am shows cardiomegaly, small left pleural effusion, bibasilar edema. Encourage incentive spirometer. 3.  Expected post op acute blood loss anemia -Last  H and H stable at 12.5 and 38 4. Thrombocytopenia resolved-platelets increased to 154,000. HIT was negative. On Lovenox for DVT prophylaxis 5. AKI on CKD (stage III)-Creatinine increased from 1.22 to 1.82.He is on Entresto and just started on Spironolactone. Will defer to heart failure   Lelon Huh [facility] 03/30/2021,7:19 AM Patient seen and examined, agree with above Hopefully can wean off dobutamine today Doing well from a surgical perspective Hopefully can go home soon  [place] C. Dorris Fetch, MD [facility] [phone]

## 2021-03-30 NOTE — TOC Progression Note (Signed)
Transition of Care [facility]) - Progression Note    Patient Details  Name: Edward [name] MRN: [id number] Date of Birth: 05/14/1956  Transition of Care [facility]) CM/SW Contact  Leone Haven, RN Phone Number: 03/30/2021, 2:54 PM  Clinical Narrative:    Patient is from home, conts on dobutamine wean, entresto.  TOC will continue to follow for dc needs.     Barriers to Discharge: Continued Medical Work up  Expected Discharge Plan and Services   In-house Referral: Clinical Social Work                                             Social Determinants of Health (SDOH) Interventions    Readmission Risk Interventions No flowsheet data found.

## 2021-03-30 NOTE — Progress Notes (Signed)
Patient ID: Edward [name], male   DOB: 21-Jul-1956, 65 y.o.   MRN: [id number]      Advanced Heart Failure Rounding Note  PCP-Cardiologist: Donato Schultz, MD   Subjective:    4/13 S/P CABG x3, now extubated.   CVP not hooked up today.  Co-ox 64% on dobutamine 2. Creatinine 1.2 => 1.8.  SBP 100s generally.   Walked this morning, no dyspnea or chest pain.   Cardiac MRI: 1. Moderately dilated LV with EF 15%, global hypokinesis with lateral akinesis. 2.  Normal RV size with EF 31% 3. LGE pattern suggests substantial viability (LGE noted primarily in lateral wall, <50% wall thickness)   Objective:   Weight Range: 88.8 kg Body mass index is 28.91 kg/m.   Vital Signs:   Temp:  [97.6 F (36.4 C)-98.6 F (37 C)] 98.6 F (37 C) (04/21 0734) Pulse Rate:  [88-100] 98 (04/21 0734) Resp:  [17-21] 18 (04/21 0734) BP: (101-130)/(59-75) 101/59 (04/21 0734) SpO2:  [92 %-97 %] 96 % (04/21 0734) Weight:  [88.8 kg] 88.8 kg (04/21 0405) Last BM Date: 03/29/21  Weight change: Filed Weights   03/28/21 0300 03/29/21 0410 03/30/21 0405  Weight: 91.8 kg 89.3 kg 88.8 kg    Intake/Output:   Intake/Output Summary (Last 24 hours) at 03/30/2021 1009 Last data filed at 03/30/2021 0700 Gross per 24 hour  Intake 541.66 ml  Output 1875 ml  Net -1333.34 ml      Physical Exam    General: NAD Neck: No JVD, no thyromegaly or thyroid nodule.  Lungs: Clear to auscultation bilaterally with normal respiratory effort. CV: Nondisplaced PMI.  Heart regular S1/S2, no S3/S4, no murmur.  No peripheral edema.  No carotid bruit.  Normal pedal pulses.  Abdomen: Soft, nontender, no hepatosplenomegaly, no distention.  Skin: Intact without lesions or rashes.  Neurologic: Alert and oriented x 3.  Psych: Normal affect. Extremities: No clubbing or cyanosis.  HEENT: Normal.   Telemetry   NSR 90s. Personally reviewed  Labs    CBC Recent Labs    03/28/21 0357 03/29/21 0340  WBC 8.6 8.7  HGB 11.6* 12.5*   HCT 34.4* 38.0*  MCV 88.4 89.4  PLT 124* 154   Basic Metabolic Panel Recent Labs    40/98/11 0340 03/30/21 0302  NA 136 137  K 4.1 4.1  CL 100 105  CO2 29 27  GLUCOSE 149* 133*  BUN 15 15  CREATININE 1.22 1.82*  CALCIUM 8.9 8.9   Liver Function Tests No results for input(s): AST, ALT, ALKPHOS, BILITOT, PROT, ALBUMIN in the last 72 hours. No results for input(s): LIPASE, AMYLASE in the last 72 hours. Cardiac Enzymes No results for input(s): CKTOTAL, CKMB, CKMBINDEX, TROPONINI in the last 72 hours.  BNP: BNP (last 3 results) Recent Labs    02/13/21 1356  BNP 998.0*    ProBNP (last 3 results) Recent Labs    03/14/21 0731  PROBNP 4,235*     D-Dimer No results for input(s): DDIMER in the last 72 hours. Hemoglobin A1C No results for input(s): HGBA1C in the last 72 hours. Fasting Lipid Panel No results for input(s): CHOL, HDL, LDLCALC, TRIG, CHOLHDL, LDLDIRECT in the last 72 hours. Thyroid Function Tests No results for input(s): TSH, T4TOTAL, T3FREE, THYROIDAB in the last 72 hours.  Invalid input(s): FREET3  Other results:   Imaging    No results found.   Medications:     Scheduled Medications: . aspirin EC  325 mg Oral Daily   Or  .  aspirin  324 mg Per Tube Daily  . atorvastatin  80 mg Oral Daily  . Chlorhexidine Gluconate Cloth  6 each Topical Daily  . digoxin  0.125 mg Oral Daily  . enoxaparin (LOVENOX) injection  40 mg Subcutaneous Q24H  . lidocaine  2 patch Transdermal Q24H  . pantoprazole  40 mg Oral Daily  . sacubitril-valsartan  1 tablet Oral BID    Infusions: . DOBUTamine 2 mcg/kg/min (03/29/21 2158)  . lactated ringers      PRN Medications: dextrose, lactated ringers, loratadine, metoprolol tartrate, morphine injection, ondansetron (ZOFRAN) IV, oxyCODONE, sodium chloride flush, traMADol   Assessment/Plan   1. CAD: Had MI in 2004 with DESx2 with totally occluded CFX (infarct-related artery) and 80% mRCA stenosis. Patient had  Taxus DES to both vessels.  Recently, noted to have EF down to 20-25% on echo, set up for coronary angiography. LHC on 4/11 showed high-grade subtotal stenoses of the LAD, left circumflex, and total occlusion of the previously placed stents in the circumflex marginal and RCA. There are extensive collaterals supplying the distal RCA, and collaterals supplying the obtuse marginal vessel. Collaterals are jeopardized due to the high-grade subtotal stenoses of the LAD and circumflex vessel. There are excellent distal targets. CMRI with substantial viability.  4/13 patient had CABG with LIMA-LAD, SVG-OM, SVG-PDA.  No s/s angina - Continue ASA 81  - Continue atorvastatin 80 daily.  2. Acute systolic CHF: Echo in 4/22 with EF 20-25%, severe LV dilation, mildly dilated RV with normal systolic function, moderate MR. Ischemic cardiomyopathy.  RHC 4/11 with elevated PCWP and CI 1.7. cMRI with LV EF 15%, RV EF 31%. Co-ox 64% today on dobutamine 2.  He is not volume overloaded on exam.  CVP not set up.  Creatinine up from 1.2 => 1.8. SBP 100s.  - Decrease dobutamine to 1 today, will move very slowly with rise in creatinine.  - No diuretics today.  Set up CVP.  - continue digoxin 0.125  - With BP softer today, will stop spironolactone.  - Continue Entresto 24/26 bid as long as SBP > 100. 3. AKI on CKD stage 3: Creatinine 1.6 on 4/14, bumped to 2.5 on 4/15 in setting of hypotension. Improved with DBA support. However, creatinine now trending back up 1.2 => 1.8.  - Stop spironolactone and hold Entresto if SBP < 100 to promote higher BP.  - No diuretic needed today.  - Encourage po hydration.  - Move very slowly with dobutamine wean.   4. Hyperlipidemia: Atorvastatin 80 daily.  5. Thrombocytopenia: Platelets mildly low at baseline, ?ITP.  Post-op down to 63>>50K >>39k > (DVT dose lovenox stopped)> 70k>>86>>124>>154K.  HIT negative.  - back on DVT dose lovenox  Mobilize.   Length of Stay: 10  Marca Ancona, MD   03/30/2021, 10:09 AM  Advanced Heart Failure Team Pager [phone] (M-F; 7a - 5p)  Please contact [facility] for night-coverage after hours (5p -7a ) and weekends on [email or web address]

## 2021-03-30 NOTE — Progress Notes (Signed)
CARDIAC REHAB PHASE I   PRE:  Rate/Rhythm: 94 SR    BP: sitting 101/67    SaO2: 96 RA  MODE:  Ambulation: 800 ft   POST:  Rate/Rhythm: 112 ST    BP: sitting 99/69     SaO2: 98 RA  Tolerated well, no c/o. Eager to d/c. Pt has questions regarding diuretics and med regimen going forward. Gave him HF booklet and diets to begin reading today. Will continue to follow. [phone]   Harriet Masson CES, ACSM 03/30/2021 9:45 AM

## 2021-03-31 LAB — BASIC METABOLIC PANEL
Anion gap: 5 (ref 5–15)
BUN: 15 mg/dL (ref 8–23)
CO2: 26 mmol/L (ref 22–32)
Calcium: 8.7 mg/dL — ABNORMAL LOW (ref 8.9–10.3)
Chloride: 107 mmol/L (ref 98–111)
Creatinine, Ser: 1.16 mg/dL (ref 0.61–1.24)
GFR, Estimated: >60 mL/min (ref >60)
Glucose, Bld: 123 mg/dL — ABNORMAL HIGH (ref 70–99)
Potassium: 4.1 mmol/L (ref 3.5–5.1)
Sodium: 138 mmol/L (ref 135–145)

## 2021-03-31 LAB — COOXEMETRY PANEL
Carboxyhemoglobin: 1.1 % (ref 0.5–1.5)
Methemoglobin: 0.8 % (ref 0.0–1.5)
O2 Saturation: 59.1 %
Total hemoglobin: 12.5 g/dL (ref 12.0–16.0)

## 2021-03-31 NOTE — Plan of Care (Signed)

## 2021-03-31 NOTE — Progress Notes (Addendum)
Patient ID: Edward [name], male   DOB: 07-29-1956, 65 y.o.   MRN: [id number]      Advanced Heart Failure Rounding Note  PCP-Cardiologist: Donato Schultz, MD   Subjective:    4/13 S/P CABG x3, now extubated.  4/21 Dobutamine.   CO-OX 59%.   Feels good. Denies SOB.   Objective:   Weight Range: 89.5 kg Body mass index is 29.15 kg/m.   Vital Signs:   Temp:  [97.4 F (36.3 C)-98.8 F (37.1 C)] 97.4 F (36.3 C) (04/22 0745) Pulse Rate:  [83-97] 92 (04/22 0745) Resp:  [15-22] 16 (04/22 0732) BP: (86-123)/(58-73) 104/59 (04/22 0745) SpO2:  [96 %-98 %] 96 % (04/22 0356) Weight:  [89.5 kg] 89.5 kg (04/22 0356) Last BM Date: 03/30/21 (per pt.)  Weight change: Filed Weights   03/29/21 0410 03/30/21 0405 03/31/21 0356  Weight: 89.3 kg 88.8 kg 89.5 kg    Intake/Output:   Intake/Output Summary (Last 24 hours) at 03/31/2021 0802 Last data filed at 03/31/2021 0358 Gross per 24 hour  Intake 519.43 ml  Output 2100 ml  Net -1580.57 ml      Physical Exam   General:  Well appearing. No resp difficulty HEENT: normal Neck: supple. no JVD. Carotids 2+ bilat; no bruits. No lymphadenopathy or thryomegaly appreciated. Cor: PMI nondisplaced. Regular rate & rhythm. No rubs, gallops or murmurs. Sternal incision approximated.  Lungs: clear Abdomen: soft, nontender, nondistended. No hepatosplenomegaly. No bruits or masses. Good bowel sounds. Extremities: no cyanosis, clubbing, rash, edema. RUE PICC  Neuro: alert & orientedx3, cranial nerves grossly intact. moves all 4 extremities w/o difficulty. Affect pleasant\   Telemetry   NSR 90s   Labs    CBC Recent Labs    03/29/21 0340  WBC 8.7  HGB 12.5*  HCT 38.0*  MCV 89.4  PLT 154   Basic Metabolic Panel Recent Labs    76/16/07 0302 03/31/21 0435  NA 137 138  K 4.1 4.1  CL 105 107  CO2 27 26  GLUCOSE 133* 123*  BUN 15 15  CREATININE 1.82* 1.16  CALCIUM 8.9 8.7*   Liver Function Tests No results for input(s): AST, ALT,  ALKPHOS, BILITOT, PROT, ALBUMIN in the last 72 hours. No results for input(s): LIPASE, AMYLASE in the last 72 hours. Cardiac Enzymes No results for input(s): CKTOTAL, CKMB, CKMBINDEX, TROPONINI in the last 72 hours.  BNP: BNP (last 3 results) Recent Labs    02/13/21 1356  BNP 998.0*    ProBNP (last 3 results) Recent Labs    03/14/21 0731  PROBNP 4,235*     D-Dimer No results for input(s): DDIMER in the last 72 hours. Hemoglobin A1C No results for input(s): HGBA1C in the last 72 hours. Fasting Lipid Panel No results for input(s): CHOL, HDL, LDLCALC, TRIG, CHOLHDL, LDLDIRECT in the last 72 hours. Thyroid Function Tests No results for input(s): TSH, T4TOTAL, T3FREE, THYROIDAB in the last 72 hours.  Invalid input(s): FREET3  Other results:   Imaging    No results found.   Medications:     Scheduled Medications: . aspirin EC  325 mg Oral Daily  . atorvastatin  80 mg Oral Daily  . Chlorhexidine Gluconate Cloth  6 each Topical Daily  . digoxin  0.125 mg Oral Daily  . enoxaparin (LOVENOX) injection  40 mg Subcutaneous Q24H  . lidocaine  2 patch Transdermal Q24H  . pantoprazole  40 mg Oral Daily  . sacubitril-valsartan  1 tablet Oral BID    Infusions: . DOBUTamine 1  mcg/kg/min (03/31/21 0356)  . lactated ringers      PRN Medications: dextrose, lactated ringers, loratadine, metoprolol tartrate, morphine injection, ondansetron (ZOFRAN) IV, oxyCODONE, sodium chloride flush, traMADol   Assessment/Plan   1. CAD: Had MI in 2004 with DESx2 with totally occluded CFX (infarct-related artery) and 80% mRCA stenosis. Patient had Taxus DES to both vessels.  Recently, noted to have EF down to 20-25% on echo, set up for coronary angiography. LHC on 4/11 showed high-grade subtotal stenoses of the LAD, left circumflex, and total occlusion of the previously placed stents in the circumflex marginal and RCA. There are extensive collaterals supplying the distal RCA, and  collaterals supplying the obtuse marginal vessel. Collaterals are jeopardized due to the high-grade subtotal stenoses of the LAD and circumflex vessel. There are excellent distal targets. CMRI with substantial viability.  4/13 patient had CABG with LIMA-LAD, SVG-OM, SVG-PDA.  No s/s ischemia.  - Continue ASA 81  - Continue atorvastatin 80 daily.  2. Acute systolic CHF: Echo in 4/22 with EF 20-25%, severe LV dilation, mildly dilated RV with normal systolic function, moderate MR. Ischemic cardiomyopathy.  RHC 4/11 with elevated PCWP and CI 1.7. cMRI with LV EF 15%, RV EF 31%.  CO-OX stable. CVP 2. Stop dobutamine.  - No room for BB yet.  - Renal function stable  - continue digoxin 0.125  - Continue Entresto 24/26 bid as long as SBP > 100. 3. AKI on CKD stage 3: Creatinine 1.6 on 4/14, bumped to 2.5 on 4/15 in setting of hypotension. Improved with DBA support.  Creatinine back down to 1.2 today.  4. Hyperlipidemia: Atorvastatin 80 daily.  5. Thrombocytopenia: Platelets mildly low at baseline, ?ITP.  Post-op down to 63>>50K >>39k > (DVT dose lovenox stopped)> 70k>>86>>124>>154>>no cbc today.  .- HIT negative.  - back on DVT dose lovenox  Looks great!  Length of Stay: 11  Amy Clegg, NP  03/31/2021, 8:02 AM  Advanced Heart Failure Team Pager [phone] (M-F; 7a - 5p)  Please contact [facility] for night-coverage after hours (5p -7a ) and weekends on [email or web address]  Patient seen with NP, agree with the above note.    Co-ox 59% today, creatinine down to 1.16. CVP remains low.    Feels good.   General: NAD Neck: No JVD, no thyromegaly or thyroid nodule.  Lungs: Clear to auscultation bilaterally with normal respiratory effort. CV: Nondisplaced PMI.  Heart regular S1/S2, no S3/S4, no murmur.  No peripheral edema.   Abdomen: Soft, nontender, no hepatosplenomegaly, no distention.  Skin: Intact without lesions or rashes.  Neurologic: Alert and oriented x 3.  Psych: Normal  affect. Extremities: No clubbing or cyanosis.  HEENT: Normal.   Stop dobutamine today, continue other meds.    If creatinine and co-ox stable tomorrow, should be ready for home.    Will make followup in CHF clinic.   Marca Ancona 03/31/2021 8:35 AM

## 2021-03-31 NOTE — Progress Notes (Addendum)
      [address]       [place] [address]             [phone]        9 Days Post-Op Procedure(s) (LRB): CORONARY ARTERY BYPASS GRAFTING (CABG) X  THREE USING LEFT INTERNAL MAMMARY ARTERY AND RIGHT ENDOSCOPIC SAPHENOUS VEIN HARVEST CONDUITS (N/A) TRANSESOPHAGEAL ECHOCARDIOGRAM (TEE) (N/A)  Subjective: Patient has no specific complaint this am. He is eating breakfast.  Objective: Vital signs in last 24 hours: Temp:  [98.3 F (36.8 C)-98.8 F (37.1 C)] 98.3 F (36.8 C) (04/22 0356) Pulse Rate:  [83-98] 83 (04/22 0356) Cardiac Rhythm: Normal sinus rhythm (04/21 1907) Resp:  [15-22] 17 (04/22 0356) BP: (86-123)/(58-73) 123/71 (04/22 0356) SpO2:  [96 %-98 %] 96 % (04/22 0356) Weight:  [89.5 kg] 89.5 kg (04/22 0356)  Pre op weight 89.9 kg Current Weight  03/31/21 89.5 kg   CVP:  [6 mmHg-10 mmHg] 6 mmHg  Intake/Output from previous day: 04/21 0701 - 04/22 0700 In: 519.4 [P.O.:480; I.V.:39.4] Out: 2100 [Urine:2100]   Physical Exam:  Cardiovascular: RRR, soft Grade I/VI murmur Pulmonary: Clear to auscultation bilaterally Abdomen: Soft, non tender, bowel sounds present. Extremities: Ted hose bilaterally, no LE edema Wounds: Clean and dry.  No erythema or signs of infection.  Lab Results: CBC: Recent Labs    03/29/21 0340  WBC 8.7  HGB 12.5*  HCT 38.0*  PLT 154   BMET:  Recent Labs    03/30/21 0302 03/31/21 0435  NA 137 138  K 4.1 4.1  CL 105 107  CO2 27 26  GLUCOSE 133* 123*  BUN 15 15  CREATININE 1.82* 1.16  CALCIUM 8.9 8.7*    PT/INR:  Lab Results  Component Value Date   INR 1.5 (H) 03/22/2021   INR 1.1 03/21/2021   ABG:  INR: Will add last result for INR, ABG once components are confirmed Will add last 4 CBG results once components are confirmed  Assessment/Plan:  1. CV - SR, ST at times. Co ox this am decreased to 59.1  (Milrinone has been off for a few days). On  Dobutamine drip (1 mcg/kg/min). Also, on Digoxin  0.125 mg daily and Entresto bid. Hope to wean Dobutamine drip off ;heart failure following 2.  Pulmonary - On room air. Encourage incentive spirometer. 3.  Expected post op acute blood loss anemia -Last  H and H stable at 12.5 and 38 4. Thrombocytopenia resolved-platelets increased to 154,000. HIT was negative. On Lovenox for DVT prophylaxis 5. AKI on CKD (stage III)-Creatinine decreased from 1.82 to 1.16.He is on Entresto. Spironolactone stopped yesterday.    Donielle M ZimmermanPA-C 03/31/2021,7:20 AM   Chart reviewed, patient examined, agree with above. Dobutamine stopped today. Renal function improved. Will repeat Co-ox and BMET in am.

## 2021-03-31 NOTE — Progress Notes (Signed)
CARDIAC REHAB PHASE I   PRE:  Rate/Rhythm: 90 sR    BP: sitting 90/64    SaO2:   MODE:  Ambulation: 800 ft   POST:  Rate/Rhythm: 109 ST    BP: sitting 85/53     SaO2: 99 RA  Tolerated well. Monitor alarming VT but looks mostly like artifact. BP low after walk. Pt denied dizziness, sts he feels well. Will f/u later for ed with wife.  [address] [place] CES, [place] 03/31/2021 11:22 AM

## 2021-03-31 NOTE — Progress Notes (Signed)
Discussed IS, sternal precautions, daily wts, diet, exercise, and CRPII with pt and family. Very receptive. All questions answered. Will refer to [facility] CRPII. He has HF booklet and low sodium diets. [id number] Ethelda Chick CES, ACSM 3:00 PM 03/31/2021

## 2021-04-01 LAB — BASIC METABOLIC PANEL
Anion gap: 8 (ref 5–15)
BUN: 18 mg/dL (ref 8–23)
CO2: 23 mmol/L (ref 22–32)
Calcium: 8.5 mg/dL — ABNORMAL LOW (ref 8.9–10.3)
Chloride: 109 mmol/L (ref 98–111)
Creatinine, Ser: 1.28 mg/dL — ABNORMAL HIGH (ref 0.61–1.24)
GFR, Estimated: >60 mL/min (ref >60)
Glucose, Bld: 123 mg/dL — ABNORMAL HIGH (ref 70–99)
Potassium: 4.2 mmol/L (ref 3.5–5.1)
Sodium: 140 mmol/L (ref 135–145)

## 2021-04-01 LAB — DIGOXIN LEVEL: Digoxin Level: 0.2 ng/mL — ABNORMAL LOW (ref 0.8–2.0)

## 2021-04-01 LAB — CBC
HCT: 36.6 % — ABNORMAL LOW (ref 39.0–52.0)
Hemoglobin: 12.2 g/dL — ABNORMAL LOW (ref 13.0–17.0)
MCH: 29.6 pg (ref 26.0–34.0)
MCHC: 33.3 g/dL (ref 30.0–36.0)
MCV: 88.8 fL (ref 80.0–100.0)
Platelets: 242 10*3/uL (ref 150–400)
RBC: 4.12 MIL/uL — ABNORMAL LOW (ref 4.22–5.81)
RDW: 13 % (ref 11.5–15.5)
WBC: 8.4 10*3/uL (ref 4.0–10.5)
nRBC: 0 % (ref 0.0–0.2)

## 2021-04-01 LAB — COOXEMETRY PANEL
Carboxyhemoglobin: 1 % (ref 0.5–1.5)
Methemoglobin: 0.9 % (ref 0.0–1.5)
O2 Saturation: 56.7 %
Total hemoglobin: 12.4 g/dL (ref 12.0–16.0)

## 2021-04-01 MED ORDER — ATORVASTATIN CALCIUM 80 MG PO TABS: 80.0000 mg | ORAL_TABLET | Freq: Every day | ORAL | 2 refills | Status: DC

## 2021-04-01 MED ORDER — OXYCODONE HCL 5 MG PO TABS: 5.0000 mg | ORAL_TABLET | ORAL | 0 refills | Status: DC | PRN

## 2021-04-01 MED ORDER — DIGOXIN 125 MCG PO TABS: 0.1250 mg | ORAL_TABLET | Freq: Every day | ORAL | 2 refills | Status: DC

## 2021-04-01 MED ORDER — SACUBITRIL-VALSARTAN 24-26 MG PO TABS: 1.0000 | ORAL_TABLET | Freq: Two times a day (BID) | ORAL | 2 refills | Status: DC

## 2021-04-01 NOTE — Progress Notes (Signed)
Patient ID: Edward [name], male   DOB: 1956/05/06, 65 y.o.   MRN: [id number]      Advanced Heart Failure Rounding Note  PCP-Cardiologist: Donato Schultz, MD   Subjective:    4/13 S/P CABG x3  CO-OX 57%, digoxin level 0.2. CVP 3.   Feels good. Denies SOB.   Objective:   Weight Range: 89.6 kg Body mass index is 29.17 kg/m.   Vital Signs:   Temp:  [97.8 F (36.6 C)-98.7 F (37.1 C)] 98.2 F (36.8 C) (04/23 0717) Pulse Rate:  [85-97] 86 (04/23 0717) Resp:  [12-21] 17 (04/23 0717) BP: (97-115)/(57-71) 115/71 (04/23 0717) SpO2:  [94 %] 94 % (04/23 0717) Weight:  [89.6 kg] 89.6 kg (04/23 0319) Last BM Date: 03/30/21  Weight change: Filed Weights   03/30/21 0405 03/31/21 0356 04/01/21 0319  Weight: 88.8 kg 89.5 kg 89.6 kg    Intake/Output:   Intake/Output Summary (Last 24 hours) at 04/01/2021 1103 Last data filed at 04/01/2021 0742 Gross per 24 hour  Intake 240 ml  Output 1750 ml  Net -1510 ml      Physical Exam    General: NAD Neck: No JVD, no thyromegaly or thyroid nodule.  Lungs: Clear to auscultation bilaterally with normal respiratory effort. CV: Nondisplaced PMI.  Heart regular S1/S2, no S3/S4, no murmur.  No peripheral edema.   Abdomen: Soft, nontender, no hepatosplenomegaly, no distention.  Skin: Intact without lesions or rashes.  Neurologic: Alert and oriented x 3.  Psych: Normal affect. Extremities: No clubbing or cyanosis.  HEENT: Normal.    Telemetry   NSR 90s   Labs    CBC Recent Labs    04/01/21 0335  WBC 8.4  HGB 12.2*  HCT 36.6*  MCV 88.8  PLT 242   Basic Metabolic Panel Recent Labs    40/98/11 0435 04/01/21 0335  NA 138 140  K 4.1 4.2  CL 107 109  CO2 26 23  GLUCOSE 123* 123*  BUN 15 18  CREATININE 1.16 1.28*  CALCIUM 8.7* 8.5*   Liver Function Tests No results for input(s): AST, ALT, ALKPHOS, BILITOT, PROT, ALBUMIN in the last 72 hours. No results for input(s): LIPASE, AMYLASE in the last 72 hours. Cardiac  Enzymes No results for input(s): CKTOTAL, CKMB, CKMBINDEX, TROPONINI in the last 72 hours.  BNP: BNP (last 3 results) Recent Labs    02/13/21 1356  BNP 998.0*    ProBNP (last 3 results) Recent Labs    03/14/21 0731  PROBNP 4,235*     D-Dimer No results for input(s): DDIMER in the last 72 hours. Hemoglobin A1C No results for input(s): HGBA1C in the last 72 hours. Fasting Lipid Panel No results for input(s): CHOL, HDL, LDLCALC, TRIG, CHOLHDL, LDLDIRECT in the last 72 hours. Thyroid Function Tests No results for input(s): TSH, T4TOTAL, T3FREE, THYROIDAB in the last 72 hours.  Invalid input(s): FREET3  Other results:   Imaging    No results found.   Medications:     Scheduled Medications: . aspirin EC  325 mg Oral Daily  . atorvastatin  80 mg Oral Daily  . Chlorhexidine Gluconate Cloth  6 each Topical Daily  . digoxin  0.125 mg Oral Daily  . enoxaparin (LOVENOX) injection  40 mg Subcutaneous Q24H  . lidocaine  2 patch Transdermal Q24H  . pantoprazole  40 mg Oral Daily  . sacubitril-valsartan  1 tablet Oral BID    Infusions: . lactated ringers      PRN Medications: dextrose, lactated ringers, loratadine,  metoprolol tartrate, morphine injection, ondansetron (ZOFRAN) IV, oxyCODONE, sodium chloride flush, traMADol   Assessment/Plan   1. CAD: Had MI in 2004 with DESx2 with totally occluded CFX (infarct-related artery) and 80% mRCA stenosis. Patient had Taxus DES to both vessels.  Recently, noted to have EF down to 20-25% on echo, set up for coronary angiography. LHC on 4/11 showed high-grade subtotal stenoses of the LAD, left circumflex, and total occlusion of the previously placed stents in the circumflex marginal and RCA. There are extensive collaterals supplying the distal RCA, and collaterals supplying the obtuse marginal vessel. Collaterals are jeopardized due to the high-grade subtotal stenoses of the LAD and circumflex vessel. There are excellent  distal targets. CMRI with substantial viability.  4/13 patient had CABG with LIMA-LAD, SVG-OM, SVG-PDA.  No chest pain.  - Continue ASA 81  - Continue atorvastatin 80 daily.  2. Acute systolic CHF: Echo in 4/22 with EF 20-25%, severe LV dilation, mildly dilated RV with normal systolic function, moderate MR. Ischemic cardiomyopathy.  RHC 4/11 with elevated PCWP and CI 1.7. cMRI with LV EF 15%, RV EF 31%.  CO-OX 57% off dobutamine. CVP 3. Creatinine stable.  - continue digoxin 0.125  - Continue Entresto 24/26 bid, no BP room to increase.  - With bump in creatinine on spironolactone recently, will hold off adding additional GDMT until we see him as outpatient.  - Does not appear to need Lasix (low CVP).  3. AKI on CKD stage 3: Creatinine 1.6 on 4/14, bumped to 2.5 on 4/15 in setting of hypotension. Improved with DBA support.  Creatinine 1.28 today.   4. Hyperlipidemia: Atorvastatin 80 daily.  5. Thrombocytopenia: Platelets mildly low at baseline, ?ITP.  Post-op down to 63>>50K >>39k > (DVT dose lovenox stopped)> 70k>>86>>124>>154>>242 today.  - HIT negative.  - back on DVT dose lovenox  Should be ready for home.  Followup CHF clinic.  Cardiac meds for discharge: ASA, atorvastatin 80, digoxin 0.125, entresto 24/26 bid.   Marca Ancona 04/01/2021 11:03 AM

## 2021-04-01 NOTE — Plan of Care (Signed)
  Problem: Education: Goal: Knowledge of General Education information will improve Description: Including pain rating scale, medication(s)/side effects and non-pharmacologic comfort measures 04/01/2021 1216 by Neil Crouch, RN Outcome: Completed/Met 04/01/2021 0800 by Neil Crouch, RN Outcome: Progressing   Problem: Health Behavior/Discharge Planning: Goal: Ability to manage health-related needs will improve 04/01/2021 1216 by Neil Crouch, RN Outcome: Completed/Met 04/01/2021 0800 by Neil Crouch, RN Outcome: Progressing   Problem: Clinical Measurements: Goal: Ability to maintain clinical measurements within normal limits will improve 04/01/2021 1216 by Neil Crouch, RN Outcome: Completed/Met 04/01/2021 0800 by Neil Crouch, RN Outcome: Progressing Goal: Will remain free from infection 04/01/2021 1216 by Neil Crouch, RN Outcome: Completed/Met 04/01/2021 0800 by Neil Crouch, RN Outcome: Progressing Goal: Diagnostic test results will improve 04/01/2021 1216 by Neil Crouch, RN Outcome: Completed/Met 04/01/2021 0800 by Neil Crouch, RN Outcome: Progressing Goal: Respiratory complications will improve 04/01/2021 1216 by Neil Crouch, RN Outcome: Completed/Met 04/01/2021 0800 by Neil Crouch, RN Outcome: Progressing Goal: Cardiovascular complication will be avoided 04/01/2021 1216 by Neil Crouch, RN Outcome: Completed/Met 04/01/2021 0800 by Neil Crouch, RN Outcome: Progressing   Problem: Activity: Goal: Risk for activity intolerance will decrease 04/01/2021 1216 by Neil Crouch, RN Outcome: Completed/Met 04/01/2021 0800 by Neil Crouch, RN Outcome: Progressing   Problem: Nutrition: Goal: Adequate nutrition will be maintained 04/01/2021 1216 by Neil Crouch, RN Outcome: Completed/Met 04/01/2021 0800 by Neil Crouch, RN Outcome: Progressing   Problem: Coping: Goal: Level of anxiety will decrease 04/01/2021 1216 by Neil Crouch, RN Outcome: Completed/Met 04/01/2021 0800 by  Neil Crouch, RN Outcome: Progressing   Problem: Elimination: Goal: Will not experience complications related to bowel motility 04/01/2021 1216 by Neil Crouch, RN Outcome: Completed/Met 04/01/2021 0800 by Neil Crouch, RN Outcome: Progressing Goal: Will not experience complications related to urinary retention 04/01/2021 1216 by Neil Crouch, RN Outcome: Completed/Met 04/01/2021 0800 by Neil Crouch, RN Outcome: Progressing   Problem: Pain Managment: Goal: General experience of comfort will improve 04/01/2021 1216 by Neil Crouch, RN Outcome: Completed/Met 04/01/2021 0800 by Neil Crouch, RN Outcome: Progressing   Problem: Safety: Goal: Ability to remain free from injury will improve 04/01/2021 1216 by Neil Crouch, RN Outcome: Completed/Met 04/01/2021 0800 by Neil Crouch, RN Outcome: Progressing   Problem: Skin Integrity: Goal: Risk for impaired skin integrity will decrease 04/01/2021 1216 by Neil Crouch, RN Outcome: Completed/Met 04/01/2021 0800 by Neil Crouch, RN Outcome: Progressing   Problem: Education: Goal: Understanding of CV disease, CV risk reduction, and recovery process will improve 04/01/2021 1216 by Neil Crouch, RN Outcome: Completed/Met 04/01/2021 0800 by Neil Crouch, RN Outcome: Progressing Goal: Individualized Educational Video(s) 04/01/2021 1216 by Neil Crouch, RN Outcome: Completed/Met 04/01/2021 0800 by Neil Crouch, RN Outcome: Progressing   Problem: Activity: Goal: Ability to return to baseline activity level will improve 04/01/2021 1216 by Neil Crouch, RN Outcome: Completed/Met 04/01/2021 0800 by Neil Crouch, RN Outcome: Progressing   Problem: Cardiovascular: Goal: Ability to achieve and maintain adequate cardiovascular perfusion will improve 04/01/2021 1216 by Neil Crouch, RN Outcome: Completed/Met 04/01/2021 0800 by Neil Crouch, RN Outcome: Progressing Goal: Vascular access site(s) Level 0-1 will be maintained 04/01/2021 1216 by Neil Crouch, RN Outcome: Completed/Met 04/01/2021 0800 by Neil Crouch, RN Outcome: Progressing   Problem: Health Behavior/Discharge Planning: Goal: Ability to safely manage health-related needs after discharge will improve 04/01/2021 1216 by Neil Crouch, RN Outcome: Completed/Met 04/01/2021 0800 by Neil Crouch, RN Outcome: Progressing

## 2021-04-01 NOTE — Progress Notes (Signed)
Pt got discharged to home, discharge instructions provided and patient showed understanding to it, IV taken out,Telemonitor DC,pt left unit in wheelchair with all of the belongings accompanied with a family member (wife)  Aleigha Gilani,RN 

## 2021-04-01 NOTE — Progress Notes (Addendum)
      [address]       [place] [address]             [phone]        10 Days Post-Op Procedure(s) (LRB): CORONARY ARTERY BYPASS GRAFTING (CABG) X  THREE USING LEFT INTERNAL MAMMARY ARTERY AND RIGHT ENDOSCOPIC SAPHENOUS VEIN HARVEST CONDUITS (N/A) TRANSESOPHAGEAL ECHOCARDIOGRAM (TEE) (N/A)  Subjective: Edward [name] says he feels well, has no new concerns, and is ready to return home.   CoOx 56  Objective: Vital signs in last 24 hours: Temp:  [97.8 F (36.6 C)-98.7 F (37.1 C)] 98 F (36.7 C) (04/23 1126) Pulse Rate:  [85-97] 86 (04/23 1126) Cardiac Rhythm: Sinus tachycardia (04/22 1907) Resp:  [12-21] 19 (04/23 1126) BP: (97-115)/(57-71) 98/70 (04/23 1126) SpO2:  [94 %] 94 % (04/23 1126) Weight:  [89.6 kg] 89.6 kg (04/23 0319)  Pre op weight 89.9 kg Current Weight  04/01/21 89.6 kg   CVP:  [2 mmHg-3 mmHg] 3 mmHg  Intake/Output from previous day: 04/22 0701 - 04/23 0700 In: 0  Out: 1850 [Urine:1850]   Physical Exam:  Cardiovascular: RRR Pulmonary: Clear to auscultation bilaterally Abdomen: Soft, non tender, bowel sounds present. Extremities: expected bruising in RLE., no LE edema Wounds: Clean and dry.  No erythema or signs of infection.  Lab Results: CBC: Recent Labs    04/01/21 0335  WBC 8.4  HGB 12.2*  HCT 36.6*  PLT 242   BMET:  Recent Labs    03/31/21 0435 04/01/21 0335  NA 138 140  K 4.1 4.2  CL 107 109  CO2 26 23  GLUCOSE 123* 123*  BUN 15 18  CREATININE 1.16 1.28*  CALCIUM 8.7* 8.5*    PT/INR:  Lab Results  Component Value Date   INR 1.5 (H) 03/22/2021   INR 1.1 03/21/2021   ABG:  INR: Will add last result for INR, ABG once components are confirmed Will add last 4 CBG results once components are confirmed  Assessment/Plan:  1. CV - SR. Co ox this am decreased to 56  (Milrinone has been off for a few days and Dobutamine d/c'd yesterday). Also, on Digoxin 0.125 mg daily and Entresto bid.  2.  Pulmonary -  On room air with acceptable O2 sats. 3.  Expected post op acute blood loss anemia -Hct stable. 4. Thrombocytopenia resolved-platelets increased to >200,000. HIT was negative.  5. AKI on CKD (stage III)-Creatinine stable below pre-op baseline of 1/49.  6.He is on Entresto per HF.  7. Disposition- agree he is ready for discharge to home. Will d/c the PICC.  Instructions given. Continue ASA, lipitor, digoxin, entresto.    Edward G. RoddenberryPA-C 04/01/2021,11:29 AM  Agree with above. Plan home today.

## 2021-04-07 ENCOUNTER — Telehealth (INDEPENDENT_AMBULATORY_CARE_PROVIDER_SITE_OTHER): Payer: Self-pay | Admitting: Thoracic Surgery (Cardiothoracic Vascular Surgery)

## 2021-04-07 ENCOUNTER — Ambulatory Visit (HOSPITAL_COMMUNITY)
Admit: 2021-04-07 | Discharge: 2021-04-07 | Disposition: A | Discharge status: Home / Self Care | Payer: Medicare Other | Attending: Adult Health | Admitting: Adult Health

## 2021-04-07 ENCOUNTER — Other Ambulatory Visit: Payer: Self-pay

## 2021-04-07 VITALS — BP 112/65 | HR 92 | Wt 202.8 lb

## 2021-04-07 DIAGNOSIS — N1831 Chronic kidney disease, stage 3a: Secondary | ICD-10-CM

## 2021-04-07 DIAGNOSIS — N183 Chronic kidney disease, stage 3 unspecified: Secondary | ICD-10-CM | POA: Insufficient documentation

## 2021-04-07 DIAGNOSIS — I252 Old myocardial infarction: Secondary | ICD-10-CM | POA: Diagnosis not present

## 2021-04-07 DIAGNOSIS — Z955 Presence of coronary angioplasty implant and graft: Secondary | ICD-10-CM | POA: Insufficient documentation

## 2021-04-07 DIAGNOSIS — Z951 Presence of aortocoronary bypass graft: Secondary | ICD-10-CM | POA: Insufficient documentation

## 2021-04-07 DIAGNOSIS — Z7982 Long term (current) use of aspirin: Secondary | ICD-10-CM | POA: Diagnosis not present

## 2021-04-07 DIAGNOSIS — I5022 Chronic systolic (congestive) heart failure: Secondary | ICD-10-CM | POA: Diagnosis not present

## 2021-04-07 DIAGNOSIS — E782 Mixed hyperlipidemia: Secondary | ICD-10-CM

## 2021-04-07 DIAGNOSIS — D696 Thrombocytopenia, unspecified: Secondary | ICD-10-CM | POA: Diagnosis not present

## 2021-04-07 DIAGNOSIS — I255 Ischemic cardiomyopathy: Secondary | ICD-10-CM

## 2021-04-07 DIAGNOSIS — E785 Hyperlipidemia, unspecified: Secondary | ICD-10-CM | POA: Diagnosis not present

## 2021-04-07 DIAGNOSIS — Z87891 Personal history of nicotine dependence: Secondary | ICD-10-CM | POA: Diagnosis not present

## 2021-04-07 DIAGNOSIS — Z8249 Family history of ischemic heart disease and other diseases of the circulatory system: Secondary | ICD-10-CM | POA: Diagnosis not present

## 2021-04-07 DIAGNOSIS — I2510 Atherosclerotic heart disease of native coronary artery without angina pectoris: Secondary | ICD-10-CM | POA: Diagnosis not present

## 2021-04-07 DIAGNOSIS — Z79899 Other long term (current) drug therapy: Secondary | ICD-10-CM | POA: Insufficient documentation

## 2021-04-07 LAB — CBC
HCT: 41.8 % (ref 39.0–52.0)
Hemoglobin: 13 g/dL (ref 13.0–17.0)
MCH: 28.6 pg (ref 26.0–34.0)
MCHC: 31.1 g/dL (ref 30.0–36.0)
MCV: 91.9 fL (ref 80.0–100.0)
Platelets: 359 10*3/uL (ref 150–400)
RBC: 4.55 MIL/uL (ref 4.22–5.81)
RDW: 13.7 % (ref 11.5–15.5)
WBC: 5.1 10*3/uL (ref 4.0–10.5)
nRBC: 0 % (ref 0.0–0.2)

## 2021-04-07 LAB — BASIC METABOLIC PANEL
Anion gap: 5 (ref 5–15)
BUN: 19 mg/dL (ref 8–23)
CO2: 25 mmol/L (ref 22–32)
Calcium: 9.2 mg/dL (ref 8.9–10.3)
Chloride: 110 mmol/L (ref 98–111)
Creatinine, Ser: 1.42 mg/dL — ABNORMAL HIGH (ref 0.61–1.24)
GFR, Estimated: 55 mL/min — ABNORMAL LOW (ref >60)
Glucose, Bld: 93 mg/dL (ref 70–99)
Potassium: 4.3 mmol/L (ref 3.5–5.1)
Sodium: 140 mmol/L (ref 135–145)

## 2021-04-07 MED ORDER — DAPAGLIFLOZIN PROPANEDIOL 10 MG PO TABS: 10.0000 mg | ORAL_TABLET | Freq: Every day | ORAL | 5 refills | Status: DC

## 2021-04-07 MED ORDER — FUROSEMIDE 20 MG PO TABS: 20.0000 mg | ORAL_TABLET | ORAL | 3 refills | Status: DC | PRN

## 2021-04-07 NOTE — Progress Notes (Signed)
     [address]       Edward [name], [name] [id number]             [phone]       Patient: Home Provider: Office Consent for Telemedicine visit obtained.  Today's visit was completed via a real-time telehealth (see specific modality noted below). The patient/authorized person provided oral consent at the time of the visit to engage in a telemedicine encounter with the present provider at [facility]. The patient/authorized person was informed of the potential benefits, limitations, and risks of telemedicine. The patient/authorized person expressed understanding that the laws that protect confidentiality also apply to telemedicine. The patient/authorized person acknowledged understanding that telemedicine does not provide emergency services and that he or she would need to call 911 or proceed to the nearest hospital for help if such a need arose.  . Total time spent in the clinical discussion 10 minutes. . Telehealth Modality: Phone visit (audio only)  I had a telephone visit with [name].  States that he is doing well.  He is ambulating without any difficulties.  His pain is well controlled.  Follow-up with him in 1 month with a chest x-ray.

## 2021-04-07 NOTE — Progress Notes (Signed)
PCP: Dr Para March  Primary Cardiologist: Dr Shirlee Latch  CT Surgeon: Dr Cliffton Asters.   HPI: Edward [name] is a 65 year old with a history of  CAD, MI 2004 DES x2 totally occluded CFX (infarct-related artery) and 80% mRCA stenosis with taxus DES to both vessels, hyperlipidemia. I seen an ECHO 2011 with EF 40-45%. He does not drink alcohol. Retired from [employer].   Admitted 03/20/21  for scheduled cath. Cath showed multivessel, pwp 30, and cardiac index 1.7. CT surgery consulted. Prior to surgery diuresed with IV lasix.  Had CABG x3. Post op course complicated by AKI. Drips slowly weaned off. GDMT limited by AKI.   Today he returns for HF follow up.Overall feeling fine. Says he has been walking around 200 feet without difficulty.  Denies SOB/PND/Orthopnea. Does have some chest tenderness. Appetite ok. No fever or chills. Weight at home 198-199  pounds. Having some swelling in RLE. Taking all medications. Lives with his wife.   Cardiac Test LHC 03/20/21  RA 9, PA 62/29 (41), PWP 31, PA Sat 64%, Fick CO 3.6/CI 1.7   Ost LAD lesion is 30% stenosed.  Ost Cx to Prox Cx lesion is 60% stenosed.  1st Mrg lesion is 100% stenosed.  Prox Cx to Mid Cx lesion is 95% stenosed.  Mid LAD-1 lesion is 75% stenosed.  Mid LAD-2 lesion is 95% stenosed.  Dist LAD lesion is 65% stenosed.  Prox RCA to Mid RCA lesion is 100% stenosed.  ECHO 03/14/21 EF 20-25% RV normal Echo 2011 EF 40-45%   ROS: All systems negative except as listed in HPI, PMH and Problem List.  SH:  Social History   Socioeconomic History  . Marital status: Married    Spouse name: Hammond Obeirne  . Number of children: 3  . Years of education: 79  . Highest education level: Not on file  Occupational History  . Occupation: Retired  Tobacco Use  . Smoking status: Former Smoker    Quit date: 12/11/2003    Years since quitting: 17.3  . Smokeless tobacco: Never Used  Vaping Use  . Vaping Use: Never used  Substance and Sexual Activity  .  Alcohol use: No  . Drug use: No  . Sexual activity: Not on file  Other Topics Concern  . 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 Aneurysm Father        brain  . Stroke Father   . Other Mother        ASCVD  . Hypertension Mother   . Heart attack Other        uncle  . Diabetes Child   . Diabetes Child   . Colon cancer Neg Hx   . Prostate cancer Neg Hx     Past Medical History:  Diagnosis Date  . CAD (coronary artery disease)   . HCV antibody positive  previous eval at [facility], In hepatology clinic  . STEMI (ST elevation myocardial infarction) (HCC) 2004   with totally occluded CFX (infarct related artery) and 80% mRCA stenosis. Taxus DES to both vessles. EF was 50-55% by LV-gram    Current Outpatient Medications  Medication Sig Dispense Refill  . aspirin EC 325 MG EC tablet Take 1 tablet (325 mg total) by mouth daily. 30 tablet 0  . atorvastatin (LIPITOR) 80 MG tablet Take 1 tablet (80 mg total) by mouth daily. 30 tablet 2  . digoxin (LANOXIN) 0.125 MG tablet Take 1 tablet (0.125 mg total) by mouth daily. 30 tablet 2  . loratadine (CLARITIN) 10 MG tablet Take 10 mg by mouth daily as needed for allergies.    . Multiple Vitamin (MULTIVITAMIN WITH MINERALS) TABS tablet Take 1 tablet by mouth daily.    . sacubitril-valsartan (ENTRESTO) 24-26 MG Take 1 tablet by mouth 2 (two) times daily. 60 tablet 2   No current facility-administered medications for this encounter.     Vitals:   04/07/21 1137  BP: 112/65  Pulse: 92  SpO2: 100%  Weight: 92 kg   Wt Readings from Last 3 Encounters:  04/07/21 92 kg  04/01/21 89.6 kg  03/16/21 91.2 kg    PHYSICAL EXAM: General:  Well appearing. No resp difficulty. Walked in the clinic  HEENT: normal Neck: supple. JVP flat. Carotids 2+ bilaterally; no bruits. No lymphadenopathy or thryomegaly appreciated. Cor: PMI normal. Regular rate & rhythm. No rubs, gallops or murmurs. Lungs: clear Abdomen: soft, nontender, nondistended. No hepatosplenomegaly. No bruits or masses. Good bowel sounds. Extremities: no cyanosis, clubbing, rash, RLE 1+ edema Neuro: alert & orientedx3, cranial nerves grossly intact. Moves all 4 extremities w/o difficulty. Affect pleasant.  EKG: SR 92 bpm narrow QRS 104 ms   ASSESSMENT & PLAN: 1. CAD:Had MI in 2004 with DESx2 with totally occluded CFX (infarct-related artery) and 80% mRCA stenosis. Patient had Taxus DES to both vessels.Recently, noted to have EF down to 20-25% on echo, set up for coronary angiography. LHC on 4/11 showed high-grade subtotal stenoses of the LAD, left circumflex, and total occlusion of the previously placed stents in the circumflex marginal and RCA. There are extensive collaterals supplying the distal RCA, and collaterals supplying the obtuse marginal vessel. Collaterals are jeopardized due to the high-grade subtotal stenoses of the LAD and circumflex vessel. There are excellent distal targets.CMRI with substantial viability.  4/13 patient had CABG with LIMA-LAD, SVG-OM, SVG-PDA.   - No chest pain.   - Continue ASA 81  - Continue atorvastatin 80 daily.   2. Chronic systolic CHF: Echo in 4/22 with EF 20-25%, severe LV dilation, mildly dilated RV with normal systolic function, moderate Edward. Ischemic cardiomyopathy. RHC 4/11 with elevated PCWP and CI 1.7. cMRI with LV EF 15%, RV EF 31%.  - NYHA II. Functionally doing well today. Volume status mildly elevated. Add  farxiga 10 mg daily  - continue digoxin 0.125  - Continue Entresto 24/26 bid - Can use 20 mg po lasix as needed for 3 pound weight gain  - Next visit consider spiro versus bb.  - Plan to check ECHO in 3 months after HF meds optimized. - Asked to add compression socks.   3. CKD stage 3: Creatinine on the day of discharge 1.3   4. Hyperlipidemia: Atorvastatin 80 daily.  5. Thrombocytopenia: During recent hospitalization.   Check BMET, CBC today. Starting on farxiga today. Discussed purpose of med change.  Follow up in 2-3 weeks with Dr  Shirlee Latch

## 2021-04-07 NOTE — Patient Instructions (Signed)
START Edward [name] 10mg  (1 tab) daily   TAKE Lasix 20mg  (1 tab) daily as needed for 3 lb weight gain   Your physician recommends that you schedule a follow-up appointment in: 2-3 weeks with the Nurse Practitioner   Please call office at [phone] option [phone] if you have any questions or concerns.    At the [facility], you and your health needs are our priority. As part of our continuing mission to provide you with exceptional heart care, we have created designated Provider Care Teams. These Care Teams include your primary Cardiologist (physician) and Advanced Practice Providers (APPs- Physician Assistants and Nurse Practitioners) who all work together to provide you with the care you need, when you need it.   You may see any of the following providers on your designated Care Team at your next follow up: Dr [phone] . Dr Marland Kitchen . Dr Arvilla Meres . Marca Ancona, NP . Thornell Mule, PA . Tonye Becket [place],NP . Robbie Lis, PharmD   Please be sure to bring in all your medications bottles to every appointment.

## 2021-04-14 ENCOUNTER — Ambulatory Visit: Payer: Medicare Other | Admitting: Thoracic Surgery (Cardiothoracic Vascular Surgery)

## 2021-04-17 ENCOUNTER — Encounter: Payer: Self-pay | Admitting: Physician Assistant

## 2021-04-17 NOTE — Progress Notes (Signed)
Cardiology Office Note    Date:  04/18/2021   ID:  Judah Carchi, DOB September 05, 1956, MRN [id number]  PCP:  Joaquim Nam, MD  Cardiologist:  Donato Schultz, MD  Electrophysiologist:  None   Chief Complaint: f/u CABG  History of Present Illness:   Edward [name] is a 65 y.o. male with history of CAD (STEMI 2004 with occluded Cx and 80% RCA s/p DESx2, recent CABG 03/2021), ICM/chronic systolic CHF, CKD stage III, thrombocytopenia, hepatitis C, mild carotid disease who presents for post-CABG follow-up.  He was remotely followed by Dr. Shirlee Latch then established care with Dr. Anne Fu in 2020. The patient had stopped his BB and statins (Lipitor, Crestor) as he felt they were causing fatigue. No remote echo available for review. He was seen in the office 02/2021 with increasing DOE and orthopnea. He was previously very active including spin classes. Outpatient stress test was very abnormal, high risk with EF 15%. Outpatient echocardiogram 03/14/21 showed EF 20-25%. Therefore outpatient cath pursued 03/20/21 showing multivessel disease. CMRI showed substantial viability, EF 15%. He underwent CABG on 4/13 with LIMA-LAD, SVG-OM, SVG-PDA. During admission he was followed by the CHF team and was treated with dobutamine and progression of GDMT. His CVP was low at DC so did not require Lasix. Meds slowly titrated due to low BP and AKI. Admission also notable for thrombocytopenia (?mildly low at baseline, ? ITP, HIT negative). He saw the HF [facility] clinic on 04/07/21 and was doing well at follow-up with planned re-evaluation 04/24/21 to discuss spironolactone vs BB. PRN Lasix was added to have on hand if needed.  He is seen back for follow-up with his wife and has been doing well. He is still building up his stamina but has not had any further orthopnea, SOB, or DOE. He reports mild edema periodically that is not significant on exam today. He has not had to use the PRN Lasix. No CP. His sternal incisions are healing  well. There is a tiny area at the very top of his sternal scar where he states he accidentally flicked off a piece of the scab while drying himself with a towel that revealed fresh pink healing tissue underneath. No unusual redness, pain, pus or drainage. He stopped Jardiance because he felt it was making him weak. He is concerned that statin may do the same.  Labwork independently reviewed: 04/07/21 Cr 1.42, K 4.3, CBC wnl Earlier 03/2021 digoxin level 0.2, Mg 2.2, LDL 118, A1c 6.4 02/2021  TSH wnl, AST wnl, ALT 58,   Past Medical History:  Diagnosis Date  . CAD (coronary artery disease)    a. STEMI 2004 with occluded Cx and 80% RCA s/p DESx2. b. CABG 03/2021.  Marland Kitchen Chronic systolic CHF (congestive heart failure) (HCC)   . CKD (chronic kidney disease), stage III (HCC)   . HCV antibody positive    previous eval at [facility] hepatology clinic  . Ischemic cardiomyopathy   . STEMI (ST elevation myocardial infarction) (HCC) 2004   with totally occluded CFX (infarct related artery) and 80% mRCA stenosis. Taxus DES to both vessles. EF was 50-55% by LV-gram  . Thrombocytopenia [facility])     Past Surgical History:  Procedure Laterality Date  . CORONARY ANGIOPLASTY WITH STENT PLACEMENT  08/22/03  . CORONARY ARTERY BYPASS GRAFT N/A 03/22/2021   Procedure: CORONARY ARTERY BYPASS GRAFTING (CABG) X  THREE USING LEFT INTERNAL MAMMARY ARTERY AND RIGHT ENDOSCOPIC SAPHENOUS VEIN HARVEST CONDUITS;  Surgeon: [name], MD;  Location: [facility] OR;  Service: Open Heart Surgery;  Laterality: N/A;  . RIGHT/LEFT HEART CATH AND CORONARY ANGIOGRAPHY N/A 03/20/2021   Procedure: RIGHT/LEFT HEART CATH AND CORONARY ANGIOGRAPHY;  Surgeon: Lennette Bihari, MD;  Location: [facility] INVASIVE CV LAB;  Service: Cardiovascular;  Laterality: N/A;  . TEE WITHOUT CARDIOVERSION N/A 03/22/2021   Procedure: TRANSESOPHAGEAL ECHOCARDIOGRAM (TEE);  Surgeon: [name], MD;  Location: [facility] OR;  Service: Open Heart Surgery;  Laterality: N/A;     Current Medications: Current Meds  Medication Sig  . aspirin EC 325 MG EC tablet Take 1 tablet (325 mg total) by mouth daily.  Marland Kitchen atorvastatin (LIPITOR) 80 MG tablet Take 1 tablet (80 mg total) by mouth daily.  . digoxin (LANOXIN) 0.125 MG tablet Take 1 tablet (0.125 mg total) by mouth daily.  . furosemide (LASIX) 20 MG tablet Take 1 tablet (20 mg total) by mouth as needed (FOR 3 LB WEIGHT GAIN).  Marland Kitchen loratadine (CLARITIN) 10 MG tablet Take 10 mg by mouth daily as needed for allergies.  . Multiple Vitamin (MULTIVITAMIN WITH MINERALS) TABS tablet Take 1 tablet by mouth daily.  . sacubitril-valsartan (ENTRESTO) 24-26 MG Take 1 tablet by mouth 2 (two) times daily.  . [DISCONTINUED] dapagliflozin propanediol (FARXIGA) 10 MG TABS tablet Take 1 tablet (10 mg total) by mouth daily before breakfast.     Allergies:   Patient has no known allergies.   Social History   Socioeconomic History  . Marital status: Married    Spouse name: Heriberto Stmartin  . Number of children: 3  . Years of education: 60  . Highest education level: Not on file  Occupational History  . Occupation: Retired  Tobacco Use  . Smoking status: Former Smoker    Quit date: 12/11/2003    Years since quitting: 17.3  . Smokeless tobacco: Never Used  Vaping Use  . Vaping Use: Never used  Substance and Sexual Activity  . Alcohol use: No  . Drug use: No  . Sexual activity: Not on file  Other Topics Concern  . 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 There is no history of Colon cancer or Prostate cancer.  ROS:   Please see the history of present illness.  All other systems are reviewed and otherwise negative.    EKGs/Labs/Other Studies Reviewed:    Studies reviewed are outlined and summarized above. Reports included below if pertinent.  IntraOp TEE 03/22/21 POST-OP IMPRESSIONS  - Left Ventricle: LVEF slightly improved, CO > 5L/min, diffuse hypokinesis  remains with slight improvement of anterior and apical wall segments.  - Right Ventricle: The right ventricle appears unchanged from pre-bypass.  - Aorta: The aorta appears unchanged from pre-bypass, no dissection noted  after  cannula removed.  - Left Atrium: The left atrium  appears unchanged from pre-bypass.  - Aortic Valve: The aortic valve appears unchanged from pre-bypass.  - Mitral Valve: The mitral valve appears unchanged from pre-bypass.  - Tricuspid Valve: The tricuspid valve appears unchanged from pre-bypass.  - Pulmonic Valve: The pulmonic valve appears unchanged from pre-bypass.  - Interventricular Septum: The interventricular septum appears unchanged  from  pre-bypass.  - Pericardium: The pericardium appears unchanged from pre-bypass.   PRE-OP FINDINGS  Left Ventricle: The left ventricle has severely reduced systolic  function, with an ejection fraction of 20-30% 20%. The cavity size was  severely dilated. The left ventricular wall thickness was not assessed.  Findings are consistent with ischemic  cardiomyopathy. Left ventricular diffuse hypokinesis.    Right Ventricle: The right ventricle has mildly reduced systolic function.  The cavity was dialated. There is no increase in right ventricular wall  thickness. Right ventricular systolic pressure could not be assessed.  There is no aneurysm seen.  PA  catheter traversing the RV into the R PA.   Left Atrium: Left atrial size was dilated. No left atrial/left atrial  appendage thrombus was detected. The left atrial appendage is well  visualized and there is no evidence of thrombus present. Left atrial  appendage velocity is normal at greater than 40  cm/s.   Right Atrium: Right atrial size was normal in size. PA catheter traversing  the RA into the RV.   Interatrial Septum: No atrial level shunt detected by color flow Doppler.  There is no evidence of a patent foramen ovale.   Pericardium: There is no evidence of pericardial effusion. There is no  pleural effusion.   Mitral Valve: The mitral valve is normal in structure. Mitral valve  regurgitation is moderate by color flow Doppler. The MR jet is  centrally-directed. There is no evidence of mitral valve vegetation. There  is No evidence of mitral stenosis.   Tricuspid Valve: The tricuspid valve was normal in structure. Tricuspid  valve regurgitation trivial to mild. The jet is directed centrally. No  evidence of tricuspid stenosis is present. There is no evidence of  tricuspid valve vegetation.   Aortic Valve: The aortic valve is tricuspid aortic valve regurgitation was  not visualized by color flow Doppler. There is no stenosis of the aortic  valve. There is no evidence of aortic valve vegetation.    Pulmonic Valve: The pulmonic valve was normal in structure.  Pulmonic valve regurgitation is not visualized by color flow Doppler.    Aorta: The aortic root and ascending aorta are normal in size and  structure. The aortic arch was not well visualized. The descending aorta  was not well visualized.   Pulmonary Artery: Theone MurdochSwan Ganz catheter present on the right. The pulmonary  artery is mildly dilated. Pulmonary hypertension is moderate.   Venous: The inferior vena cava was not well visualized.   Shunts: There is no evidence of an atrial septal defect.   Cath  03/20/21  Ost LAD lesion is 30% stenosed.  Ost Cx to Prox Cx lesion is 60% stenosed.  1st Mrg lesion is 100% stenosed.  Prox Cx to Mid Cx lesion is 95% stenosed.  Mid LAD-1 lesion is 75% stenosed.  Mid LAD-2 lesion is 95% stenosed.  Dist LAD lesion is 65% stenosed.  Prox RCA to Mid RCA lesion is 100% stenosed.   Moderate elevation of right heart pressures with moderate pulmonary hypertension with mean PA pressure at 41 mmHg.  Severe multivessel CAD with high-grade subtotal stenoses of the LAD, left  circumflex, and total occlusion of the previously placed stents in the circumflex marginal and RCA.  There are extensive collaterals supplying the distal RCA, and collaterals supplying the obtuse marginal vessel.  Collaterals are jeopardized due to the high-grade subtotal stenoses of the LAD and circumflex vessel.  There are excellent distal targets.  Findings are compatible with a severe ischemic cardiomyopathy; suspect a significant a amount of hibernating myocardium.  RECOMMENDATION: Patient will be heparinized later today following TR band removal.  Surgical consultation for CABG revascularization.  Echo 03/14/21 1. Left ventricular ejection fraction, by estimation, is 20 to 25%. The  left ventricle has severely decreased function. The left ventricle  demonstrates global hypokinesis. The left ventricular internal cavity size  was severely dilated. There is mild  left ventricular hypertrophy. Left ventricular diastolic parameters are  consistent with Grade II diastolic dysfunction (pseudonormalization).  2. Right ventricular systolic function is normal. The right ventricular  size is mildly enlarged. There is mildly elevated pulmonary artery  systolic pressure.  3. Left atrial size was severely dilated.  4. The mitral valve is normal in structure. Moderate mitral valve  regurgitation. No evidence of mitral stenosis.  5. The aortic valve is normal in structure. Aortic valve  regurgitation is  not visualized. No aortic stenosis is present.  6. The inferior vena cava is dilated in size with >50% respiratory  variability, suggesting right atrial pressure of 8 mmHg.   Carotid duplex 03/20/21 Summary:  Right Carotid: Velocities in the right ICA are consistent with a 1-39%  stenosis.   Left Carotid: Velocities in the left ICA are consistent with a 1-39%  stenosis.  Vertebrals: Bilateral vertebral arteries demonstrate antegrade flow.     EKG:  EKG is ordered today, personally reviewed, demonstrating NSR 95bpm, RAD, NSIVCD, nonspecific STTW changes. No acute change from prior  Recent Labs: 02/13/2021: B Natriuretic Peptide 998.0; TSH 2.133 03/14/2021: NT-Pro BNP 4,235 03/22/2021: ALT 21 03/27/2021: Magnesium 2.2 04/07/2021: BUN 19; Creatinine, Ser 1.42; Hemoglobin 13.0; Platelets 359; Potassium 4.3; Sodium 140  Recent Lipid Panel    Component Value Date/Time   CHOL 163 03/21/2021 0831   TRIG 62 03/21/2021 0831   HDL 33 (L) 03/21/2021 0831   CHOLHDL 4.9 03/21/2021 0831   VLDL 12 03/21/2021 0831   LDLCALC 118 (H) 03/21/2021 0831    PHYSICAL EXAM:    VS:  BP 102/60   Pulse 95   Ht 5\' 9"  (1.753 m)   Wt 203 lb (92.1 kg)   SpO2 95%   BMI 29.98 kg/m   BMI: Body mass index is 29.98 kg/m.  GEN: Well nourished, well developed male in no acute distress HEENT: normocephalic, atraumatic Neck: no JVD, carotid bruits, or masses Cardiac: RRR; no murmurs, rubs, or gallops, no edema  Respiratory:  clear to auscultation bilaterally, normal work of breathing GI: soft, nontender, nondistended, + BS MS: no deformity or atrophy Skin: warm and dry, no rash. Wounds are c/d/i except there is a very small area at the very top of his sternal scar (less than the size of a pencil eraser) where he accidentally flicked off a piece of the scab that revealed fresh pink healing tissue underneath - no pus, drainage or unusual erythema Neuro:  Alert and Oriented x 3, Strength and  sensation are intact, follows commands Psych: euthymic mood, full affect  Wt Readings from Last 3 Encounters:  04/18/21 203 lb (92.1 kg)  04/07/21 202 lb 12.8 oz (92 kg)  04/01/21 197 lb 8.5 oz (89.6 kg)  ASSESSMENT & PLAN:   1. CAD s/p CABG, with HLD goal LDL <70 - clinically doing well. He saw Dr. Cliffton Asters virtually per chart review and also has in person follow-up coming up in a few weeks. Will defer to cardiac surgeon and AHF team for readiness to participate in cardiac rehab. His sternal incisions are healing well. There is a very small area at the very top of his sternal scar (less than the size of a pencil eraser) where he accidentally flicked off a piece of the scab that revealed fresh pink healing tissue underneath. There is no drainage, pus or unusual erythema. I reviewed with Dr. Anne Fu who agrees this looks to be normal healing. Wound precautions reviewed with patient - he will contact the cardiac surgeon office if there are any signs of redness, drainage, pain or pus from the site. He is on full dose ASA for now - will defer timing or reduction of dose to 81mg  per cardiac surgeon. Continue statin. Historically he has hoped to avoid these but we discussed the rationale for treatment. He is agreeable to see how he feels on it. Check LFTs/lipids in 1 month (~8 weeks after initiation). F/u with Advanced HF team as scheduled next week to determine their thoughts on starting BB therapy.  2. Chronic systolic CHF/ICM - clinically euvolemic. Continue Entresto, digoxin and follow up with AHF clinic as scheduled 5/16 to discuss initiation of BB vs spironolactone. At present time it does not seem that his blood pressure will support this so I will not add today. As above, he stopped Jardiance due to perceived side effects. Can review further when he sees the AHF team. They plan to repeat his echocardiogram 3 months after they deem his HF meds optimized. This will infer decisions on whether ICD  needs to be pursued.  3. CKD stage III - baseline Cr per prior review appears 1.4-1.7 which is similar to most recent value.  4. Thrombocytopenia - resolved by follow-up CBC 04/07/21.  5. Mild carotid disease - anticipate recheck duplex in 1-2 years at the discretion of primary cardiologist. This can be reviewed at time of follow-up.  Disposition: Continue ongoing f/u as scheduled with the CHF clinic. Since he is also seeing their team closely as well as the cardiac surgeon soon, we will plan follow-up in our office in 3 months with Dr. 04/09/21.   Medication Adjustments/Labs and Tests Ordered: Current medicines are reviewed at length with the patient today.  Concerns regarding medicines are outlined above. Medication changes, Labs and Tests ordered today are summarized above and listed in the Patient Instructions accessible in Encounters.   Signed, Anne Fu, PA-C  04/18/2021 2:38 PM    [facility] [address] [place], [place], [place]  [place] Phone: [phone]; Fax: [phone]

## 2021-04-18 ENCOUNTER — Encounter: Payer: Self-pay | Admitting: Physician Assistant

## 2021-04-18 ENCOUNTER — Other Ambulatory Visit: Payer: Self-pay

## 2021-04-18 ENCOUNTER — Ambulatory Visit (INDEPENDENT_AMBULATORY_CARE_PROVIDER_SITE_OTHER): Payer: Medicare Other | Admitting: Physician Assistant

## 2021-04-18 VITALS — BP 102/60 | HR 95 | Ht 69.0 in | Wt 203.0 lb

## 2021-04-18 DIAGNOSIS — E785 Hyperlipidemia, unspecified: Secondary | ICD-10-CM

## 2021-04-18 DIAGNOSIS — I779 Disorder of arteries and arterioles, unspecified: Secondary | ICD-10-CM | POA: Diagnosis not present

## 2021-04-18 DIAGNOSIS — N1831 Chronic kidney disease, stage 3a: Secondary | ICD-10-CM | POA: Diagnosis not present

## 2021-04-18 DIAGNOSIS — D696 Thrombocytopenia, unspecified: Secondary | ICD-10-CM

## 2021-04-18 DIAGNOSIS — I5022 Chronic systolic (congestive) heart failure: Secondary | ICD-10-CM | POA: Diagnosis not present

## 2021-04-18 DIAGNOSIS — I2510 Atherosclerotic heart disease of native coronary artery without angina pectoris: Secondary | ICD-10-CM | POA: Diagnosis not present

## 2021-04-18 NOTE — Patient Instructions (Signed)
Medication Instructions:  Your physician recommends that you continue on your current medications as directed. Please refer to the Current Medication list given to you today.  *If you need a refill on your cardiac medications before your next appointment, please call your pharmacy*   Lab Work: 1 MONTH:  FASTING LIPID & LFT  If you have labs (blood work) drawn today and your tests are completely normal, you will receive your results only by: Marland Kitchen MyChart Message (if you have MyChart) OR . A paper copy in the mail If you have any lab test that is abnormal or we need to change your treatment, we will call you to review the results.   Testing/Procedures: None ordered   Follow-Up: At [facility], you and your health needs are our priority.  As part of our continuing mission to provide you with exceptional heart care, we have created designated Provider Care Teams.  These Care Teams include your primary Cardiologist (physician) and Advanced Practice Providers (APPs -  Physician Assistants and Nurse Practitioners) who all work together to provide you with the care you need, when you need it.  We recommend signing up for the patient portal called "MyChart".  Sign up information is provided on this After Visit Summary.  MyChart is used to connect with patients for Virtual Visits (Telemedicine).  Patients are able to view lab/test results, encounter notes, upcoming appointments, etc.  Non-urgent messages can be sent to your provider as well.   To learn more about what you can do with MyChart, go to [email or web address].    Your next appointment:   3 month(s)  The format for your next appointment:   In Person  Provider:   Donato Schultz, MD   Other Instructions

## 2021-04-20 ENCOUNTER — Encounter: Payer: Medicare Other | Attending: Family Medicine

## 2021-04-20 ENCOUNTER — Other Ambulatory Visit: Payer: Self-pay

## 2021-04-20 DIAGNOSIS — Z9889 Other specified postprocedural states: Secondary | ICD-10-CM

## 2021-04-20 DIAGNOSIS — Z48812 Encounter for surgical aftercare following surgery on the circulatory system: Secondary | ICD-10-CM | POA: Insufficient documentation

## 2021-04-20 DIAGNOSIS — Z951 Presence of aortocoronary bypass graft: Secondary | ICD-10-CM

## 2021-04-20 NOTE — Progress Notes (Signed)
Virtual Visit completed. Patient informed on EP and RD appointment and 6 Minute walk test. Patient also informed of patient health questionnaires on My Chart. Patient Verbalizes understanding. Visit diagnosis can be found in [facility] 04/18/2021. 

## 2021-04-21 ENCOUNTER — Telehealth: Payer: Medicare Other | Admitting: Thoracic Surgery (Cardiothoracic Vascular Surgery)

## 2021-04-23 NOTE — Progress Notes (Signed)
PCP: Dr Para March  Primary Cardiologist: Dr Shirlee Latch  CT Surgeon: Dr Cliffton Asters.   HPI: [name] is a 65 year old with a history of  CAD, MI 2004 DES x2 totally occluded CFX (infarct-related artery) and 80% mRCA stenosis with taxus DES to both vessels, hyperlipidemia. I seen an ECHO 2011 with EF 40-45%. He does not drink alcohol. Retired from [employer].   Admitted 03/20/21  for scheduled cath. Cath showed multivessel, pwp 30, and cardiac index 1.7. CT surgery consulted. Prior to surgery diuresed with IV lasix.  Had CABG x3. Post op course complicated by AKI. Drips slowly weaned off. GDMT limited by AKI.   Today he returns for HF follow up. Last visit farxiga was added and lasix was changed to as needed.  Says he took farxiga for 3 days but then stopped due to fatigue and dizziness.  Overall feeling much better. Able to walk 15 minutes on treadmill without difficulty. Starting cardiac rehab this week. Denies SOB/PND/Orthopnea. No chest pain.  Appetite improving. No fever or chills. Weight at home 200 pounds. Taking all medications.  Lives with his wife.   Cardiac Test LHC 03/20/21  RA 9, PA 62/29 (41), PWP 31, PA Sat 64%, Fick CO 3.6/CI 1.7   Ost LAD lesion is 30% stenosed.  Ost Cx to Prox Cx lesion is 60% stenosed.  1st Mrg lesion is 100% stenosed.  Prox Cx to Mid Cx lesion is 95% stenosed.  Mid LAD-1 lesion is 75% stenosed.  Mid LAD-2 lesion is 95% stenosed.  Dist LAD lesion is 65% stenosed.  Prox RCA to Mid RCA lesion is 100% stenosed.  ECHO 03/14/21 EF 20-25% RV normal Echo 2011 EF 40-45%   ROS: All systems negative except as listed in HPI, PMH and Problem List.  SH:  Social History   Socioeconomic History  . Marital status: Married    Spouse name: Edward [name]  . Number of children: 3  . Years of education: 42  . Highest education level: Not on file  Occupational History  . Occupation: Retired  Tobacco Use  . Smoking status: Former Smoker    Packs/day: 1.00     Years: 10.00    Pack years: 10.00    Types: Cigarettes    Quit date: 12/11/2003    Years since quitting: 17.3  . Smokeless tobacco: Never Used  Vaping Use  . Vaping Use: Never used  Substance and Sexual Activity  . Alcohol use: No  . Drug use: No  . Sexual activity: Not on file  Other Topics Concern  . 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 Aneurysm Father        brain  . Stroke Father   . Other Mother        ASCVD  . Hypertension Mother   . Heart attack Other  uncle  . Diabetes Child   . Diabetes Child   . Colon cancer Neg Hx   . Prostate cancer Neg Hx     Past Medical History:  Diagnosis Date  . CAD (coronary artery disease)    a. STEMI 2004 with occluded Cx and 80% RCA s/p DESx2. b. CABG 03/2021.  Marland Kitchen Chronic systolic CHF (congestive heart failure) (HCC)   . CKD (chronic kidney disease), stage III (HCC)   . HCV antibody positive    previous eval at [facility] hepatology clinic  . Ischemic cardiomyopathy   . STEMI (ST elevation myocardial infarction) (HCC) 2004   with totally occluded CFX (infarct related artery) and 80% mRCA stenosis. Taxus DES to both vessles. EF was 50-55% by LV-gram  . Thrombocytopenia (HCC)     Current Outpatient Medications  Medication Sig Dispense Refill  . aspirin EC 325 MG EC tablet Take 1 tablet (325 mg total) by mouth daily. 30 tablet 0  . atorvastatin (LIPITOR) 80 MG tablet Take 1 tablet (80 mg total) by mouth daily. 30 tablet  2  . digoxin (LANOXIN) 0.125 MG tablet Take 1 tablet (0.125 mg total) by mouth daily. 30 tablet 2  . furosemide (LASIX) 20 MG tablet Take 1 tablet (20 mg total) by mouth as needed (FOR 3 LB WEIGHT GAIN). 45 tablet 3  . loratadine (CLARITIN) 10 MG tablet Take 10 mg by mouth daily as needed for allergies.    . sacubitril-valsartan (ENTRESTO) 24-26 MG Take 1 tablet by mouth 2 (two) times daily. 60 tablet 2  . Multiple Vitamin (MULTIVITAMIN WITH MINERALS) TABS tablet Take 1 tablet by mouth daily.     No current facility-administered medications for this encounter.    Vitals:   04/24/21 1027  BP: 122/79  Pulse: 94  SpO2: 99%  Weight: 93.4 kg (205 lb 12.8 oz)   Wt Readings from Last 3 Encounters:  04/24/21 93.4 kg (205 lb 12.8 oz)  04/18/21 92.1 kg (203 lb)  04/07/21 92 kg (202 lb 12.8 oz)    PHYSICAL EXAM: General:  Well appearing. No resp difficulty HEENT: normal Neck: supple. no JVD. Carotids 2+ bilat; no bruits. No lymphadenopathy or thryomegaly appreciated. Cor: PMI nondisplaced. Regular rate & rhythm. No rubs, gallops or murmurs. Lungs: clear Abdomen: soft, nontender, nondistended. No hepatosplenomegaly. No bruits or masses. Good bowel sounds. Extremities: no cyanosis, clubbing, rash, edema Neuro: alert & orientedx3, cranial nerves grossly intact. moves all 4 extremities w/o difficulty. Affect pleasant   ASSESSMENT & PLAN: 1. CAD:Had MI in 2004 with DESx2 with totally occluded CFX (infarct-related artery) and 80% mRCA stenosis. Patient had Taxus DES to both vessels.Recently, noted to have EF down to 20-25% on echo, set up for coronary angiography. LHC on 4/11 showed high-grade subtotal stenoses of the LAD, left circumflex, and total occlusion of the previously placed stents in the circumflex marginal and RCA. There are extensive collaterals supplying the distal RCA, and collaterals supplying the obtuse marginal vessel. Collaterals are jeopardized due to the high-grade  subtotal stenoses of the LAD and circumflex vessel. There are excellent distal targets.CMRI with substantial viability.  03/10/21 patient had CABG with LIMA-LAD, SVG-OM, SVG-PDA.   - No chest pain.   - Continue ASA 81  - Continue atorvastatin 80 daily.  -He is willing to rechallenge farxiga. Will try at bedtime.   2. Chronic systolic CHF: Echo in 4/22 with EF 20-25%, severe LV dilation, mildly dilated RV with normal systolic function, moderate MR. Ischemic cardiomyopathy. RHC 4/11 with elevated PCWP  and CI 1.7. cMRI with LV EF 15%, RV EF 31%.  - NYHA II. Volume status stable. He has not been taking lasix. Plan to continue lasix as needed.  -Restart farxiga 10 mg daily at bedtime. If he develops dizziness will need to stop.  - Add coreg 3.125 mg twice a dat  - continue digoxin 0.125  - Continue Entresto 24/26 bid - Next visit consider spiro.  - Plan to check ECHO in 3 months after HF meds optimized.  3. CKD stage 3: Creatinine 04/07/21 stable at 1.42   Check BMET next visit.   4. Hyperlipidemia: Atorvastatin 80 daily.  5. Thrombocytopenia: resolved.    Follow up in 4 weeks with pharmacy and 8 weeks with Dr Shirlee Latch and an ECHO.   Edward Ruark NP-C  11:19 AM

## 2021-04-24 ENCOUNTER — Other Ambulatory Visit: Payer: Self-pay

## 2021-04-24 ENCOUNTER — Other Ambulatory Visit (HOSPITAL_COMMUNITY): Payer: Self-pay

## 2021-04-24 ENCOUNTER — Ambulatory Visit (HOSPITAL_COMMUNITY)
Admission: RE | Admit: 2021-04-24 | Discharge: 2021-04-24 | Disposition: A | Discharge status: Home / Self Care | Payer: Medicare Other | Source: Ambulatory Visit | Attending: Adult Health | Admitting: Adult Health

## 2021-04-24 ENCOUNTER — Encounter (HOSPITAL_COMMUNITY): Payer: Self-pay

## 2021-04-24 VITALS — BP 122/79 | HR 94 | Wt 205.8 lb

## 2021-04-24 DIAGNOSIS — I252 Old myocardial infarction: Secondary | ICD-10-CM | POA: Insufficient documentation

## 2021-04-24 DIAGNOSIS — N1831 Chronic kidney disease, stage 3a: Secondary | ICD-10-CM

## 2021-04-24 DIAGNOSIS — I255 Ischemic cardiomyopathy: Secondary | ICD-10-CM | POA: Insufficient documentation

## 2021-04-24 DIAGNOSIS — Z87891 Personal history of nicotine dependence: Secondary | ICD-10-CM | POA: Insufficient documentation

## 2021-04-24 DIAGNOSIS — I5022 Chronic systolic (congestive) heart failure: Secondary | ICD-10-CM | POA: Insufficient documentation

## 2021-04-24 DIAGNOSIS — Z7982 Long term (current) use of aspirin: Secondary | ICD-10-CM | POA: Insufficient documentation

## 2021-04-24 DIAGNOSIS — N183 Chronic kidney disease, stage 3 unspecified: Secondary | ICD-10-CM | POA: Diagnosis not present

## 2021-04-24 DIAGNOSIS — E785 Hyperlipidemia, unspecified: Secondary | ICD-10-CM | POA: Diagnosis not present

## 2021-04-24 DIAGNOSIS — I2510 Atherosclerotic heart disease of native coronary artery without angina pectoris: Secondary | ICD-10-CM | POA: Diagnosis not present

## 2021-04-24 DIAGNOSIS — Z955 Presence of coronary angioplasty implant and graft: Secondary | ICD-10-CM | POA: Diagnosis not present

## 2021-04-24 DIAGNOSIS — Z8249 Family history of ischemic heart disease and other diseases of the circulatory system: Secondary | ICD-10-CM | POA: Insufficient documentation

## 2021-04-24 DIAGNOSIS — Z79899 Other long term (current) drug therapy: Secondary | ICD-10-CM | POA: Insufficient documentation

## 2021-04-24 DIAGNOSIS — Z951 Presence of aortocoronary bypass graft: Secondary | ICD-10-CM | POA: Insufficient documentation

## 2021-04-24 MED ORDER — DAPAGLIFLOZIN PROPANEDIOL 10 MG PO TABS: 10.0000 mg | ORAL_TABLET | Freq: Every day | ORAL | 3 refills | Status: DC

## 2021-04-24 MED ORDER — CARVEDILOL 3.125 MG PO TABS: 3.1250 mg | ORAL_TABLET | Freq: Two times a day (BID) | ORAL | 3 refills | Status: DC

## 2021-04-24 NOTE — Patient Instructions (Signed)
Restart Farxiga once daily 10 mg at bedtime. Coreg 3.125mg  twice daily Please refer to scheduled follow up appts.

## 2021-04-26 ENCOUNTER — Other Ambulatory Visit: Payer: Self-pay

## 2021-04-26 VITALS — Ht 70.0 in | Wt 205.6 lb

## 2021-04-26 DIAGNOSIS — Z951 Presence of aortocoronary bypass graft: Secondary | ICD-10-CM

## 2021-04-26 DIAGNOSIS — Z48812 Encounter for surgical aftercare following surgery on the circulatory system: Secondary | ICD-10-CM | POA: Diagnosis not present

## 2021-04-26 DIAGNOSIS — Z9889 Other specified postprocedural states: Secondary | ICD-10-CM

## 2021-04-26 NOTE — Patient Instructions (Addendum)
Patient Instructions  Patient Details  Name: Edward [name] MRN: [id number] Date of Birth: 03/11/1956 Referring Provider:  Laurey Morale, MD  Below are your personal goals for exercise, nutrition, and risk factors. Our goal is to help you stay on track towards obtaining and maintaining these goals. We will be discussing your progress on these goals with you throughout the program.  Initial Exercise Prescription:  Initial Exercise Prescription - 04/26/21 1500      Date of Initial Exercise RX and Referring Provider   Date 04/26/21    Referring Provider Shirlee Latch      Treadmill   MPH 2.6    Grade 1    Minutes 15    METs 3.5      NuStep   Level 3    SPM 80    Minutes 15    METs 3.5      Recumbant Elliptical   Level 2    RPM 50    Minutes 15    METs 3.5      REL-XR   Level 3    Speed 50    Minutes 15    METs 3.5      Intensity   THRR 40-80% of Max Heartrate 116-142    Ratings of Perceived Exertion 11-13    Perceived Dyspnea 0-4      Resistance Training   Training Prescription Yes    Weight 3 lb    Reps 10-15           Exercise Goals: Frequency: Be able to perform aerobic exercise two to three times per week in program working toward 2-5 days per week of home exercise.  Intensity: Work with a perceived exertion of 11 (fairly light) - 15 (hard) while following your exercise prescription.  We will make changes to your prescription with you as you progress through the program.   Duration: Be able to do 30 to 45 minutes of continuous aerobic exercise in addition to a 5 minute warm-up and a 5 minute cool-down routine.   Nutrition Goals: Your personal nutrition goals will be established when you do your nutrition analysis with the dietician.  The following are general nutrition guidelines to follow: Cholesterol < 200mg /day Sodium < 1500mg /day Fiber: Men over 50 yrs - 30 grams per day  Personal Goals:  Personal Goals and Risk Factors at Admission - 04/26/21  1650      Core Components/Risk Factors/Patient Goals on Admission    Weight Management Yes;Weight Loss    Intervention Weight Management: Develop a combined nutrition and exercise program designed to reach desired caloric intake, while maintaining appropriate intake of nutrient and fiber, sodium and fats, and appropriate energy expenditure required for the weight goal.;Weight Management: Provide education and appropriate resources to help participant work on and attain dietary goals.;Weight Management/Obesity: Establish reasonable short term and long term weight goals.    Expected Outcomes Long Term: Adherence to nutrition and physical activity/exercise program aimed toward attainment of established weight goal;Short Term: Continue to assess and modify interventions until short term weight is achieved;Weight Loss: Understanding of general recommendations for a balanced deficit meal plan, which promotes 1-2 lb weight loss per week and includes a negative energy balance of [phone] kcal/d;Understanding recommendations for meals to include 15-35% energy as protein, 25-35% energy from fat, 35-60% energy from carbohydrates, less than 200mg  of dietary cholesterol, 20-35 gm of total fiber daily;Understanding of distribution of calorie intake throughout the day with the consumption of 4-5 meals/snacks    Heart  Failure Yes    Intervention Provide a combined exercise and nutrition program that is supplemented with education, support and counseling about heart failure. Directed toward relieving symptoms such as shortness of breath, decreased exercise tolerance, and extremity edema.    Expected Outcomes Improve functional capacity of life;Short term: Attendance in program 2-3 days a week with increased exercise capacity. Reported lower sodium intake. Reported increased fruit and vegetable intake. Reports medication compliance.;Short term: Daily weights obtained and reported for increase. Utilizing diuretic protocols set  by physician.;Long term: Adoption of self-care skills and reduction of barriers for early signs and symptoms recognition and intervention leading to self-care maintenance.    Hypertension Yes    Intervention Provide education on lifestyle modifcations including regular physical activity/exercise, weight management, moderate sodium restriction and increased consumption of fresh fruit, vegetables, and low fat dairy, alcohol moderation, and smoking cessation.;Monitor prescription use compliance.    Expected Outcomes Short Term: Continued assessment and intervention until BP is < 140/2mm HG in hypertensive participants. < 130/41mm HG in hypertensive participants with diabetes, heart failure or chronic kidney disease.;Long Term: Maintenance of blood pressure at goal levels.    Lipids Yes    Intervention Provide education and support for participant on nutrition & aerobic/resistive exercise along with prescribed medications to achieve LDL 70mg , HDL >40mg .    Expected Outcomes Short Term: Participant states understanding of desired cholesterol values and is compliant with medications prescribed. Participant is following exercise prescription and nutrition guidelines.;Long Term: Cholesterol controlled with medications as prescribed, with individualized exercise RX and with personalized nutrition plan. Value goals: LDL < 70mg , HDL > 40 mg.           Tobacco Use Initial Evaluation: Social History   Tobacco Use  Smoking Status Former Smoker  . Packs/day: 1.00  . Years: 10.00  . Pack years: 10.00  . Types: Cigarettes  . Quit date: 12/11/2003  . Years since quitting: 17.3  Smokeless Tobacco Never Used    Exercise Goals and Review:  Exercise Goals    Row Name 04/26/21 1511             Exercise Goals   Increase Physical Activity Yes       Intervention Provide advice, education, support and counseling about physical activity/exercise needs.;Develop an individualized exercise prescription for  aerobic and resistive training based on initial evaluation findings, risk stratification, comorbidities and participant's personal goals.       Expected Outcomes Short Term: Attend rehab on a regular basis to increase amount of physical activity.;Long Term: Add in home exercise to make exercise part of routine and to increase amount of physical activity.;Long Term: Exercising regularly at least 3-5 days a week.       Increase Strength and Stamina Yes       Intervention Provide advice, education, support and counseling about physical activity/exercise needs.;Develop an individualized exercise prescription for aerobic and resistive training based on initial evaluation findings, risk stratification, comorbidities and participant's personal goals.       Expected Outcomes Short Term: Increase workloads from initial exercise prescription for resistance, speed, and METs.;Short Term: Perform resistance training exercises routinely during rehab and add in resistance training at home;Long Term: Improve cardiorespiratory fitness, muscular endurance and strength as measured by increased METs and functional capacity (04/28/21)       Able to understand and use rate of perceived exertion (RPE) scale Yes       Intervention Provide education and explanation on how to use RPE scale  Expected Outcomes Short Term: Able to use RPE daily in rehab to express subjective intensity level;Long Term:  Able to use RPE to guide intensity level when exercising independently       Able to understand and use Dyspnea scale Yes       Intervention Provide education and explanation on how to use Dyspnea scale       Expected Outcomes Short Term: Able to use Dyspnea scale daily in rehab to express subjective sense of shortness of breath during exertion;Long Term: Able to use Dyspnea scale to guide intensity level when exercising independently       Knowledge and understanding of Target Heart Rate Range (THRR) Yes       Intervention Provide  education and explanation of THRR including how the numbers were predicted and where they are located for reference       Expected Outcomes Short Term: Able to state/look up THRR;Short Term: Able to use daily as guideline for intensity in rehab;Long Term: Able to use THRR to govern intensity when exercising independently       Able to check pulse independently Yes       Intervention Provide education and demonstration on how to check pulse in carotid and radial arteries.;Review the importance of being able to check your own pulse for safety during independent exercise       Expected Outcomes Short Term: Able to explain why pulse checking is important during independent exercise;Long Term: Able to check pulse independently and accurately       Understanding of Exercise Prescription Yes       Intervention Provide education, explanation, and written materials on patient's individual exercise prescription       Expected Outcomes Short Term: Able to explain program exercise prescription;Long Term: Able to explain home exercise prescription to exercise independently              Copy of goals given to participant.

## 2021-04-26 NOTE — Progress Notes (Signed)
Cardiac Individual Treatment Plan  Patient Details  Name: Edward [name] MRN: [id number] Date of Birth: December 08, 1956 Referring Provider:   Flowsheet Row Cardiac Rehab from 04/26/2021 in [facility] Cardiac and Pulmonary Rehab  Referring Provider Shirlee Latch      Initial Encounter Date:  Flowsheet Row Cardiac Rehab from 04/26/2021 in [facility] Cardiac and Pulmonary Rehab  Date 04/26/21      Visit Diagnosis: S/P CABG x 3  History of open heart surgery  Patient's Home Medications on Admission:  Current Outpatient Medications:  .  aspirin EC 325 MG EC tablet, Take 1 tablet (325 mg total) by mouth daily., Disp: 30 tablet, Rfl: 0 .  atorvastatin (LIPITOR) 80 MG tablet, Take 1 tablet (80 mg total) by mouth daily., Disp: 30 tablet, Rfl: 2 .  carvedilol (COREG) 3.125 MG tablet, Take 1 tablet (3.125 mg total) by mouth 2 (two) times daily., Disp: 60 tablet, Rfl: 3 .  dapagliflozin propanediol (FARXIGA) 10 MG TABS tablet, Take 1 tablet (10 mg total) by mouth daily before breakfast., Disp: 30 tablet, Rfl: 3 .  digoxin (LANOXIN) 0.125 MG tablet, Take 1 tablet (0.125 mg total) by mouth daily., Disp: 30 tablet, Rfl: 2 .  furosemide (LASIX) 20 MG tablet, Take 1 tablet (20 mg total) by mouth as needed (FOR 3 LB WEIGHT GAIN)., Disp: 45 tablet, Rfl: 3 .  loratadine (CLARITIN) 10 MG tablet, Take 10 mg by mouth daily as needed for allergies., Disp: , Rfl:  .  Multiple Vitamin (MULTIVITAMIN WITH MINERALS) TABS tablet, Take 1 tablet by mouth daily., Disp: , Rfl:  .  sacubitril-valsartan (ENTRESTO) 24-26 MG, Take 1 tablet by mouth 2 (two) times daily., Disp: 60 tablet, Rfl: 2  Past Medical History: Past Medical History:  Diagnosis Date  . CAD (coronary artery disease)    a. STEMI 2004 with occluded Cx and 80% RCA s/p DESx2. b. CABG 03/2021.  Marland Kitchen Chronic systolic CHF (congestive heart failure) (HCC)   . CKD (chronic kidney disease), stage III (HCC)   . HCV antibody positive    previous eval at [facility] hepatology clinic  .  Ischemic cardiomyopathy   . STEMI (ST elevation myocardial infarction) (HCC) 2004   with totally occluded CFX (infarct related artery) and 80% mRCA stenosis. Taxus DES to both vessles. EF was 50-55% by LV-gram  . Thrombocytopenia (HCC)     Tobacco Use: Social History   Tobacco Use  Smoking Status Former Smoker  . Packs/day: 1.00  . Years: 10.00  . Pack years: 10.00  . Types: Cigarettes  . Quit date: 12/11/2003  . Years since quitting: 17.3  Smokeless Tobacco Never Used    Labs: Recent Review Flowsheet Data    Labs for ITP Cardiac and Pulmonary Rehab Latest Ref Rng & Units 03/28/2021 03/29/2021 03/30/2021 03/31/2021 04/01/2021   Cholestrol 0 - 200 mg/dL - - - - -   LDLCALC 0 - 99 mg/dL - - - - -   HDL >40 mg/dL - - - - -   Trlycerides <150 mg/dL - - - - -   Hemoglobin A1c 4.8 - 5.6 % - - - - -   PHART 7.350 - 7.450 - - - - -   PCO2ART 32.0 - 48.0 mmHg - - - - -   HCO3 20.0 - 28.0 mmol/L - - - - -   TCO2 22 - 32 mmol/L - - - - -   ACIDBASEDEF 0.0 - 2.0 mmol/L - - - - -   O2SAT % 67.0 62.4 63.7  59.1 56.7       Exercise Target Goals: Exercise Program Goal: Individual exercise prescription set using results from initial 6 min walk test and THRR while considering  patient's activity barriers and safety.   Exercise Prescription Goal: Initial exercise prescription builds to 30-45 minutes a day of aerobic activity, 2-3 days per week.  Home exercise guidelines will be given to patient during program as part of exercise prescription that the participant will acknowledge.   Education: Aerobic Exercise: - Group verbal and visual presentation on the components of exercise prescription. Introduces F.I.T.T principle from ACSM for exercise prescriptions.  Reviews F.I.T.T. principles of aerobic exercise including progression. Written material given at graduation.   Education: Resistance Exercise: - Group verbal and visual presentation on the components of exercise prescription. Introduces  F.I.T.T principle from ACSM for exercise prescriptions  Reviews F.I.T.T. principles of resistance exercise including progression. Written material given at graduation.    Education: Exercise & Equipment Safety: - Individual verbal instruction and demonstration of equipment use and safety with use of the equipment. Flowsheet Row Cardiac Rehab from 04/26/2021 in [facility] Cardiac and Pulmonary Rehab  Date 04/26/21  Educator AS  Instruction Review Code 1- Verbalizes Understanding      Education: Exercise Physiology & General Exercise Guidelines: - Group verbal and written instruction with models to review the exercise physiology of the cardiovascular system and associated critical values. Provides general exercise guidelines with specific guidelines to those with heart or lung disease.    Education: Flexibility, Balance, Mind/Body Relaxation: - Group verbal and visual presentation with interactive activity on the components of exercise prescription. Introduces F.I.T.T principle from ACSM for exercise prescriptions. Reviews F.I.T.T. principles of flexibility and balance exercise training including progression. Also discusses the mind body connection.  Reviews various relaxation techniques to help reduce and manage stress (i.e. Deep breathing, progressive muscle relaxation, and visualization). Balance handout provided to take home. Written material given at graduation.   Activity Barriers & Risk Stratification:   6 Minute Walk:  6 Minute Walk    Row Name 04/26/21 1506         6 Minute Walk   Phase Initial     Distance 1405 feet     Walk Time 6 minutes     # of Rest Breaks 0     MPH 2.66     METS 3.5     RPE 7     Perceived Dyspnea  0     VO2 Peak 12.2     Symptoms No     Resting HR 91 bpm     Resting BP 106/60     Resting Oxygen Saturation  99 %     Exercise Oxygen Saturation  during 6 min walk 99 %     Max Ex. HR 121 bpm     Max Ex. BP 132/76     2 Minute Post BP 118/66             Oxygen Initial Assessment:   Oxygen Re-Evaluation:   Oxygen Discharge (Final Oxygen Re-Evaluation):   Initial Exercise Prescription:  Initial Exercise Prescription - 04/26/21 1500      Date of Initial Exercise RX and Referring Provider   Date 04/26/21    Referring Provider Shirlee Latch      Treadmill   MPH 2.6    Grade 1    Minutes 15    METs 3.5      NuStep   Level 3    SPM 80  Minutes 15    METs 3.5      Recumbant Elliptical   Level 2    RPM 50    Minutes 15    METs 3.5      REL-XR   Level 3    Speed 50    Minutes 15    METs 3.5           Perform Capillary Blood Glucose checks as needed.  Exercise Prescription Changes:  Exercise Prescription Changes    Row Name 04/26/21 1500             Response to Exercise   Blood Pressure (Admit) 106/60       Blood Pressure (Exercise) 132/76       Blood Pressure (Exit) 118/66       Heart Rate (Admit) 91 bpm       Heart Rate (Exercise) 121 bpm       Heart Rate (Exit) 79 bpm       Oxygen Saturation (Admit) 99 %       Oxygen Saturation (Exercise) 99 %       Rating of Perceived Exertion (Exercise) 7       Perceived Dyspnea (Exercise) 0       Symptoms none              Exercise Comments:   Exercise Goals and Review:  Exercise Goals    Row Name 04/26/21 1511             Exercise Goals   Increase Physical Activity Yes       Intervention Provide advice, education, support and counseling about physical activity/exercise needs.;Develop an individualized exercise prescription for aerobic and resistive training based on initial evaluation findings, risk stratification, comorbidities and participant's personal goals.       Expected Outcomes Short Term: Attend rehab on a regular basis to increase amount of physical activity.;Long Term: Add in home exercise to make exercise part of routine and to increase amount of physical activity.;Long Term: Exercising regularly at least 3-5 days a week.       Increase  Strength and Stamina Yes       Intervention Provide advice, education, support and counseling about physical activity/exercise needs.;Develop an individualized exercise prescription for aerobic and resistive training based on initial evaluation findings, risk stratification, comorbidities and participant's personal goals.       Expected Outcomes Short Term: Increase workloads from initial exercise prescription for resistance, speed, and METs.;Short Term: Perform resistance training exercises routinely during rehab and add in resistance training at home;Long Term: Improve cardiorespiratory fitness, muscular endurance and strength as measured by increased METs and functional capacity ( )       Able to understand and use rate of perceived exertion (RPE) scale Yes       Intervention Provide education and explanation on how to use RPE scale       Expected Outcomes Short Term: Able to use RPE daily in rehab to express subjective intensity level;Long Term:  Able to use RPE to guide intensity level when exercising independently       Able to understand and use Dyspnea scale Yes       Intervention Provide education and explanation on how to use Dyspnea scale       Expected Outcomes Short Term: Able to use Dyspnea scale daily in rehab to express subjective sense of shortness of breath during exertion;Long Term: Able to use Dyspnea scale to guide intensity level when exercising independently  Knowledge and understanding of Target Heart Rate Range (THRR) Yes       Intervention Provide education and explanation of THRR including how the numbers were predicted and where they are located for reference       Expected Outcomes Short Term: Able to state/look up THRR;Short Term: Able to use daily as guideline for intensity in rehab;Long Term: Able to use THRR to govern intensity when exercising independently       Able to check pulse independently Yes       Intervention Provide education and demonstration on how  to check pulse in carotid and radial arteries.;Review the importance of being able to check your own pulse for safety during independent exercise       Expected Outcomes Short Term: Able to explain why pulse checking is important during independent exercise;Long Term: Able to check pulse independently and accurately       Understanding of Exercise Prescription Yes       Intervention Provide education, explanation, and written materials on patient's individual exercise prescription       Expected Outcomes Short Term: Able to explain program exercise prescription;Long Term: Able to explain home exercise prescription to exercise independently              Exercise Goals Re-Evaluation :   Discharge Exercise Prescription (Final Exercise Prescription Changes):  Exercise Prescription Changes - 04/26/21 1500      Response to Exercise   Blood Pressure (Admit) 106/60    Blood Pressure (Exercise) 132/76    Blood Pressure (Exit) 118/66    Heart Rate (Admit) 91 bpm    Heart Rate (Exercise) 121 bpm    Heart Rate (Exit) 79 bpm    Oxygen Saturation (Admit) 99 %    Oxygen Saturation (Exercise) 99 %    Rating of Perceived Exertion (Exercise) 7    Perceived Dyspnea (Exercise) 0    Symptoms none           Nutrition:  Target Goals: Understanding of nutrition guidelines, daily intake of sodium 1500mg , cholesterol 200mg , calories 30% from fat and 7% or less from saturated fats, daily to have 5 or more servings of fruits and vegetables.  Education: All About Nutrition: -Group instruction provided by verbal, written material, interactive activities, discussions, models, and posters to present general guidelines for heart healthy nutrition including fat, fiber, MyPlate, the role of sodium in heart healthy nutrition, utilization of the nutrition label, and utilization of this knowledge for meal planning. Follow up email sent as well. Written material given at graduation.   Biometrics:  Pre Biometrics  - 04/26/21 1513      Pre Biometrics   Height 5\' 10"  (1.778 m)    Weight 205 lb 9.6 oz (93.3 kg)    BMI (Calculated) 29.5    Single Leg Stand 28.05 seconds            Nutrition Therapy Plan and Nutrition Goals:   Nutrition Assessments:  MEDIFICTS Score Key:  ?70 Need to make dietary changes   40-70 Heart Healthy Diet  ? 40 Therapeutic Level Cholesterol Diet  Flowsheet Row Cardiac Rehab from 04/26/2021 in [facility] Cardiac and Pulmonary Rehab  Picture Your Plate Total Score on Admission 73     Picture Your Plate Scores:  <16<40 Unhealthy dietary pattern with much room for improvement.  41-50 Dietary pattern unlikely to meet recommendations for good health and room for improvement.  51-60 More healthful dietary pattern, with some room for improvement.   >60 Healthy  dietary pattern, although there may be some specific behaviors that could be improved.    Nutrition Goals Re-Evaluation:   Nutrition Goals Discharge (Final Nutrition Goals Re-Evaluation):   Psychosocial: Target Goals: Acknowledge presence or absence of significant depression and/or stress, maximize coping skills, provide positive support system. Participant is able to verbalize types and ability to use techniques and skills needed for reducing stress and depression.   Education: Stress, Anxiety, and Depression - Group verbal and visual presentation to define topics covered.  Reviews how body is impacted by stress, anxiety, and depression.  Also discusses healthy ways to reduce stress and to treat/manage anxiety and depression.  Written material given at graduation.   Education: Sleep Hygiene -Provides group verbal and written instruction about how sleep can affect your health.  Define sleep hygiene, discuss sleep cycles and impact of sleep habits. Review good sleep hygiene tips.    Initial Review & Psychosocial Screening:  Initial Psych Review & Screening - 04/20/21 1314      Initial Review   Current  issues with None Identified      Family Dynamics   Good Support System? Yes    Comments He has a good family support system. He has a wife and three children he can call if he needs support. Rayyaan has a positive outlook on his health.      Barriers   Psychosocial barriers to participate in program The patient should benefit from training in stress management and relaxation.;There are no identifiable barriers or psychosocial needs.      Screening Interventions   Interventions Encouraged to exercise;To provide support and resources with identified psychosocial needs;Provide feedback about the scores to participant    Expected Outcomes Short Term goal: Utilizing psychosocial counselor, staff and physician to assist with identification of specific Stressors or current issues interfering with healing process. Setting desired goal for each stressor or current issue identified.;Long Term Goal: Stressors or current issues are controlled or eliminated.;Short Term goal: Identification and review with participant of any Quality of Life or Depression concerns found by scoring the questionnaire.;Long Term goal: The participant improves quality of Life and PHQ9 Scores as seen by post scores and/or verbalization of changes           Quality of Life Scores:   Quality of Life - 04/26/21 1514      Quality of Life   Select Quality of Life      Quality of Life Scores   Health/Function Pre 25.6 %    Socioeconomic Pre 25.83 %    Psych/Spiritual Pre 30 %    Family Pre 24.5 %    GLOBAL Pre 26.41 %          Scores of 19 and below usually indicate a poorer quality of life in these areas.  A difference of  2-3 points is a clinically meaningful difference.  A difference of 2-3 points in the total score of the Quality of Life Index has been associated with significant improvement in overall quality of life, self-image, physical symptoms, and general health in studies assessing change in quality of  life.  PHQ-9: Recent Review Flowsheet Data    Depression screen [facility] 2/9 04/26/2021 02/03/2019 11/15/2017   Decreased Interest 2 0 0   Down, Depressed, Hopeless 0 0 0   PHQ - 2 Score 2 0 0   Altered sleeping 0 - -   Tired, decreased energy 0 - -   Change in appetite 1 - -   Feeling bad  or failure about yourself  0 - -   Trouble concentrating 0 - -   Moving slowly or fidgety/restless 1 - -   Suicidal thoughts 0 - -   PHQ-9 Score 4 - -     Interpretation of Total Score  Total Score Depression Severity:  1-4 = Minimal depression, 5-9 = Mild depression, 10-14 = Moderate depression, 15-19 = Moderately severe depression, 20-27 = Severe depression   Psychosocial Evaluation and Intervention:  Psychosocial Evaluation - 04/20/21 1315      Psychosocial Evaluation & Interventions   Interventions Encouraged to exercise with the program and follow exercise prescription;Relaxation education;Stress management education    Comments He has a good family support system. He has a wife and three children he can call if he needs support. Aleks has a positive outlook on his health.    Expected Outcomes Short: Exercise regularly to support mental health and notify staff of any changes. Long: maintain mental health and well being through teaching of rehab or prescribed medications independently.    Continue Psychosocial Services  Follow up required by staff           Psychosocial Re-Evaluation:   Psychosocial Discharge (Final Psychosocial Re-Evaluation):   Vocational Rehabilitation: Provide vocational rehab assistance to qualifying candidates.   Vocational Rehab Evaluation & Intervention:   Education: Education Goals: Education classes will be provided on a variety of topics geared toward better understanding of heart health and risk factor modification. Participant will state understanding/return demonstration of topics presented as noted by education test scores.  Learning  Barriers/Preferences:  Learning Barriers/Preferences - 04/20/21 1309      Learning Barriers/Preferences   Learning Barriers None    Learning Preferences None           General Cardiac Education Topics:  AED/CPR: - Group verbal and written instruction with the use of models to demonstrate the basic use of the AED with the basic ABC's of resuscitation.   Anatomy and Cardiac Procedures: - Group verbal and visual presentation and models provide information about basic cardiac anatomy and function. Reviews the testing methods done to diagnose heart disease and the outcomes of the test results. Describes the treatment choices: Medical Management, Angioplasty, or Coronary Bypass Surgery for treating various heart conditions including Myocardial Infarction, Angina, Valve Disease, and Cardiac Arrhythmias.  Written material given at graduation.   Medication Safety: - Group verbal and visual instruction to review commonly prescribed medications for heart and lung disease. Reviews the medication, class of the drug, and side effects. Includes the steps to properly store meds and maintain the prescription regimen.  Written material given at graduation.   Intimacy: - Group verbal instruction through game format to discuss how heart and lung disease can affect sexual intimacy. Written material given at graduation..   Know Your Numbers and Heart Failure: - Group verbal and visual instruction to discuss disease risk factors for cardiac and pulmonary disease and treatment options.  Reviews associated critical values for Overweight/Obesity, Hypertension, Cholesterol, and Diabetes.  Discusses basics of heart failure: signs/symptoms and treatments.  Introduces Heart Failure Zone chart for action plan for heart failure.  Written material given at graduation.   Infection Prevention: - Provides verbal and written material to individual with discussion of infection control including proper hand washing and  proper equipment cleaning during exercise session. Flowsheet Row Cardiac Rehab from 04/26/2021 in [facility] Cardiac and Pulmonary Rehab  Date 04/26/21  Educator AS  Instruction Review Code 1- Verbalizes Understanding  Falls Prevention: - Provides verbal and written material to individual with discussion of falls prevention and safety. Flowsheet Row Cardiac Rehab from 04/26/2021 in [facility] Cardiac and Pulmonary Rehab  Date 04/26/21  Educator AS  Instruction Review Code 1- Verbalizes Understanding      Other: -Provides group and verbal instruction on various topics (see comments)   Knowledge Questionnaire Score:   Core Components/Risk Factors/Patient Goals at Admission:  Personal Goals and Risk Factors at Admission - 04/26/21 1650      Core Components/Risk Factors/Patient Goals on Admission    Weight Management Yes;Weight Loss    Intervention Weight Management: Develop a combined nutrition and exercise program designed to reach desired caloric intake, while maintaining appropriate intake of nutrient and fiber, sodium and fats, and appropriate energy expenditure required for the weight goal.;Weight Management: Provide education and appropriate resources to help participant work on and attain dietary goals.;Weight Management/Obesity: Establish reasonable short term and long term weight goals.    Expected Outcomes Long Term: Adherence to nutrition and physical activity/exercise program aimed toward attainment of established weight goal;Short Term: Continue to assess and modify interventions until short term weight is achieved;Weight Loss: Understanding of general recommendations for a balanced deficit meal plan, which promotes 1-2 lb weight loss per week and includes a negative energy balance of [phone] kcal/d;Understanding recommendations for meals to include 15-35% energy as protein, 25-35% energy from fat, 35-60% energy from carbohydrates, less than 200mg  of dietary cholesterol, 20-35 gm of  total fiber daily;Understanding of distribution of calorie intake throughout the day with the consumption of 4-5 meals/snacks    Heart Failure Yes    Intervention Provide a combined exercise and nutrition program that is supplemented with education, support and counseling about heart failure. Directed toward relieving symptoms such as shortness of breath, decreased exercise tolerance, and extremity edema.    Expected Outcomes Improve functional capacity of life;Short term: Attendance in program 2-3 days a week with increased exercise capacity. Reported lower sodium intake. Reported increased fruit and vegetable intake. Reports medication compliance.;Short term: Daily weights obtained and reported for increase. Utilizing diuretic protocols set by physician.;Long term: Adoption of self-care skills and reduction of barriers for early signs and symptoms recognition and intervention leading to self-care maintenance.    Hypertension Yes    Intervention Provide education on lifestyle modifcations including regular physical activity/exercise, weight management, moderate sodium restriction and increased consumption of fresh fruit, vegetables, and low fat dairy, alcohol moderation, and smoking cessation.;Monitor prescription use compliance.    Expected Outcomes Short Term: Continued assessment and intervention until BP is < 140/62mm HG in hypertensive participants. < 130/69mm HG in hypertensive participants with diabetes, heart failure or chronic kidney disease.;Long Term: Maintenance of blood pressure at goal levels.    Lipids Yes    Intervention Provide education and support for participant on nutrition & aerobic/resistive exercise along with prescribed medications to achieve LDL 70mg , HDL >40mg .    Expected Outcomes Short Term: Participant states understanding of desired cholesterol values and is compliant with medications prescribed. Participant is following exercise prescription and nutrition guidelines.;Long  Term: Cholesterol controlled with medications as prescribed, with individualized exercise RX and with personalized nutrition plan. Value goals: LDL < 70mg , HDL > 40 mg.           Education:Diabetes - Individual verbal and written instruction to review signs/symptoms of diabetes, desired ranges of glucose level fasting, after meals and with exercise. Acknowledge that pre and post exercise glucose checks will be done for 3 sessions at entry  of program.   Core Components/Risk Factors/Patient Goals Review:    Core Components/Risk Factors/Patient Goals at Discharge (Final Review):    ITP Comments:  ITP Comments    Row Name 04/20/21 1312           ITP Comments Virtual Visit completed. Patient informed on EP and RD appointment and 6 Minute walk test. Patient also informed of patient health questionnaires on My Chart. Patient Verbalizes understanding. Visit diagnosis can be found in [facility] 04/18/2021.              Comments: initial ITP

## 2021-05-01 ENCOUNTER — Encounter: Payer: Medicare Other | Admitting: *Deleted

## 2021-05-01 ENCOUNTER — Other Ambulatory Visit: Payer: Self-pay

## 2021-05-01 DIAGNOSIS — Z951 Presence of aortocoronary bypass graft: Secondary | ICD-10-CM | POA: Diagnosis not present

## 2021-05-01 DIAGNOSIS — Z48812 Encounter for surgical aftercare following surgery on the circulatory system: Secondary | ICD-10-CM | POA: Diagnosis not present

## 2021-05-01 NOTE — Progress Notes (Signed)
Daily Session Note  Patient Details  Name: Edward [name] MRN: [id number] Date of Birth: [birth year] Referring Provider:   Flowsheet Row Cardiac Rehab from 04/26/2021 in [facility] Cardiac and Pulmonary Rehab  Referring Provider [name]      Encounter Date: 05/01/2021  Check In:  Session Check In - 05/01/21 1107      Check-In   Supervising physician immediately available to respond to emergencies See telemetry face sheet for immediately available ER MD    Location [facility]-Cardiac & Pulmonary Rehab    Staff Present Renita Papa, RN BSN;Joseph Tedd Sias, [place], ACSM CEP, Exercise Physiologist;Kelly Rosalia Hammers, MPA, RN    Virtual Visit No    Medication changes reported     No    Fall or balance concerns reported    No    Warm-up and Cool-down Performed on first and last piece of equipment    Resistance Training Performed Yes    VAD Patient? No    PAD/SET Patient? No      Pain Assessment   Currently in Pain? No/denies              Social History   Tobacco Use  Smoking Status Former Smoker  . Packs/day: 1.00  . Years: 10.00  . Pack years: 10.00  . Types: Cigarettes  . Quit date: 12/11/2003  . Years since quitting: 17.4  Smokeless Tobacco Never Used    Goals Met:  Independence with exercise equipment Exercise tolerated well No report of cardiac concerns or symptoms Strength training completed today  Goals Unmet:  Not Applicable  Comments: First full day of exercise!  Patient was oriented to gym and equipment including functions, settings, policies, and procedures.  Patient's individual exercise prescription and treatment plan were reviewed.  All starting workloads were established based on the results of the 6 minute walk test done at initial orientation visit.  The plan for exercise progression was also introduced and progression will be customized based on patient's performance and goals.    Dr. Emily Filbert is Medical Director for [place].  Dr. Ottie Glazier is Medical Director for [facility] Pulmonary Rehabilitation.

## 2021-05-03 ENCOUNTER — Other Ambulatory Visit: Payer: Self-pay

## 2021-05-03 DIAGNOSIS — Z48812 Encounter for surgical aftercare following surgery on the circulatory system: Secondary | ICD-10-CM | POA: Diagnosis not present

## 2021-05-03 DIAGNOSIS — Z951 Presence of aortocoronary bypass graft: Secondary | ICD-10-CM

## 2021-05-03 NOTE — Progress Notes (Signed)
Daily Session Note  Patient Details  Name: Mccrae Speciale MRN: [id number] Date of Birth: 19-May-1956 Referring Provider:   Flowsheet Row Cardiac Rehab from 04/26/2021 in [facility] Cardiac and Pulmonary Rehab  Referring Provider [name]      Encounter Date: 05/03/2021  Check In:  Session Check In - 05/03/21 1134      Check-In   Supervising physician immediately available to respond to emergencies See telemetry face sheet for immediately available ER MD    Location [facility]-Cardiac & Pulmonary Rehab    Staff Present Birdie Sons, MPA, RN;Melissa Caiola RDN, Rowe Pavy, BA, ACSM CEP, Exercise Physiologist;Joseph Tessie Fass RCP,RRT,BSRT    Virtual Visit No    Medication changes reported     No    Fall or balance concerns reported    No    Warm-up and Cool-down Performed on first and last piece of equipment    Resistance Training Performed Yes    VAD Patient? No    PAD/SET Patient? No      Pain Assessment   Currently in Pain? No/denies              Social History   Tobacco Use  Smoking Status Former Smoker  . Packs/day: 1.00  . Years: 10.00  . Pack years: 10.00  . Types: Cigarettes  . Quit date: 12/11/2003  . Years since quitting: 17.4  Smokeless Tobacco Never Used    Goals Met:  Independence with exercise equipment Exercise tolerated well No report of cardiac concerns or symptoms Strength training completed today  Goals Unmet:  Not Applicable  Comments: Pt able to follow exercise prescription today without complaint.  Will continue to monitor for progression.    Dr. Emily Filbert is Medical Director for [place].  Dr. Ottie Glazier is Medical Director for [facility] Pulmonary Rehabilitation.

## 2021-05-05 ENCOUNTER — Other Ambulatory Visit: Payer: Self-pay

## 2021-05-05 ENCOUNTER — Encounter: Payer: Medicare Other | Admitting: *Deleted

## 2021-05-05 DIAGNOSIS — Z951 Presence of aortocoronary bypass graft: Secondary | ICD-10-CM

## 2021-05-05 DIAGNOSIS — Z48812 Encounter for surgical aftercare following surgery on the circulatory system: Secondary | ICD-10-CM | POA: Diagnosis not present

## 2021-05-05 NOTE — Progress Notes (Signed)
Daily Session Note  Patient Details  Name: Edward [name] MRN: [id number] Date of Birth: 1956-01-15 Referring Provider:   Flowsheet Row Cardiac Rehab from 04/26/2021 in [facility] Cardiac and Pulmonary Rehab  Referring Provider [name]      Encounter Date: 05/05/2021  Check In:  Session Check In - 05/05/21 1105      Check-In   Supervising physician immediately available to respond to emergencies See telemetry face sheet for immediately available ER MD    Location [facility]Cardiac & Pulmonary Rehab    Staff Present Renita Papa, RN Margurite Auerbach, MS, ASCM CEP, Exercise Physiologist;Jessica Luan Pulling, MA, RCEP, CCRP, CCET    Virtual Visit No    Medication changes reported     No    Fall or balance concerns reported    No    Warm-up and Cool-down Performed on first and last piece of equipment    Resistance Training Performed Yes    VAD Patient? No    PAD/SET Patient? No      Pain Assessment   Currently in Pain? No/denies              Social History   Tobacco Use  Smoking Status Former Smoker  . Packs/day: 1.00  . Years: 10.00  . Pack years: 10.00  . Types: Cigarettes  . Quit date: 12/11/2003  . Years since quitting: 17.4  Smokeless Tobacco Never Used    Goals Met:  Independence with exercise equipment Exercise tolerated well No report of cardiac concerns or symptoms Strength training completed today  Goals Unmet:  Not Applicable  Comments: Pt able to follow exercise prescription today without complaint.  Will continue to monitor for progression.    Dr. Emily Filbert is Medical Director for [place].  Dr. Ottie Glazier is Medical Director for [facility] Pulmonary Rehabilitation.

## 2021-05-09 ENCOUNTER — Encounter: Payer: Medicare Other | Admitting: *Deleted

## 2021-05-09 ENCOUNTER — Other Ambulatory Visit: Payer: Self-pay

## 2021-05-09 DIAGNOSIS — Z951 Presence of aortocoronary bypass graft: Secondary | ICD-10-CM

## 2021-05-09 DIAGNOSIS — Z48812 Encounter for surgical aftercare following surgery on the circulatory system: Secondary | ICD-10-CM | POA: Diagnosis not present

## 2021-05-09 NOTE — Progress Notes (Signed)
Daily Session Note  Patient Details  Name: Edward [name] MRN: [id number] Date of Birth: [birth year] Referring Provider:   Flowsheet Row Cardiac Rehab from 04/26/2021 in [facility] Cardiac and Pulmonary Rehab  Referring Provider [name]      Encounter Date: 05/09/2021  Check In:  Session Check In - 05/09/21 0841      Check-In   Supervising physician immediately available to respond to emergencies See telemetry face sheet for immediately available ER MD    Location [facility]Cardiac & Pulmonary Rehab    Staff Present Heath Lark, RN, BSN, Jacklynn Bue, MS, ASCM CEP, Exercise Physiologist;Melissa Caiola RDN, LDN    Virtual Visit No    Medication changes reported     No    Fall or balance concerns reported    No    Warm-up and Cool-down Performed on first and last piece of equipment    Resistance Training Performed Yes    VAD Patient? No    PAD/SET Patient? No      Pain Assessment   Currently in Pain? No/denies              Social History   Tobacco Use  Smoking Status Former Smoker  . Packs/day: 1.00  . Years: 10.00  . Pack years: 10.00  . Types: Cigarettes  . Quit date: 12/11/2003  . Years since quitting: 17.4  Smokeless Tobacco Never Used    Goals Met:  Independence with exercise equipment Exercise tolerated well No report of cardiac concerns or symptoms  Goals Unmet:  Not Applicable  Comments: Pt able to follow exercise prescription today without complaint.  Will continue to monitor for progression.    Dr. Emily Filbert is Medical Director for [place].  Dr. Ottie Glazier is Medical Director for [facility] Pulmonary Rehabilitation.

## 2021-05-10 ENCOUNTER — Other Ambulatory Visit: Payer: Self-pay

## 2021-05-10 ENCOUNTER — Encounter: Payer: Medicare Other | Attending: Cardiology | Admitting: *Deleted

## 2021-05-10 DIAGNOSIS — Z951 Presence of aortocoronary bypass graft: Secondary | ICD-10-CM | POA: Insufficient documentation

## 2021-05-10 DIAGNOSIS — Z9889 Other specified postprocedural states: Secondary | ICD-10-CM | POA: Diagnosis not present

## 2021-05-10 NOTE — Progress Notes (Signed)
Daily Session Note  Patient Details  Name: Edward [name] MRN: [id number] Date of Birth: May 31, 1956 Referring Provider:   Flowsheet Row Cardiac Rehab from 04/26/2021 in [facility] Cardiac and Pulmonary Rehab  Referring Provider [name]      Encounter Date: 05/10/2021  Check In:  Session Check In - 05/10/21 1122      Check-In   Supervising physician immediately available to respond to emergencies See telemetry face sheet for immediately available ER MD    Location [facility]Cardiac & Pulmonary Rehab    Staff Present Renita Papa, RN BSN;Jessica [place], MA, RCEP, CCRP, CCET;Melissa [place] RDN, Rowe Pavy, [place], ACSM CEP, Exercise Physiologist    Virtual Visit No    Medication changes reported     No    Fall or balance concerns reported    No    Warm-up and Cool-down Performed on first and last piece of equipment    Resistance Training Performed Yes    VAD Patient? No    PAD/SET Patient? No      Pain Assessment   Currently in Pain? No/denies              Social History   Tobacco Use  Smoking Status Former Smoker  . Packs/day: 1.00  . Years: 10.00  . Pack years: 10.00  . Types: Cigarettes  . Quit date: 12/11/2003  . Years since quitting: 17.4  Smokeless Tobacco Never Used    Goals Met:  Independence with exercise equipment Exercise tolerated well No report of cardiac concerns or symptoms Strength training completed today  Goals Unmet:  Not Applicable  Comments: Pt able to follow exercise prescription today without complaint.  Will continue to monitor for progression.    Dr. Emily Filbert is Medical Director for [place].  Dr. Ottie Glazier is Medical Director for [facility] Pulmonary Rehabilitation.

## 2021-05-11 ENCOUNTER — Other Ambulatory Visit: Payer: Self-pay | Admitting: Thoracic Surgery (Cardiothoracic Vascular Surgery)

## 2021-05-11 DIAGNOSIS — Z951 Presence of aortocoronary bypass graft: Secondary | ICD-10-CM

## 2021-05-12 ENCOUNTER — Ambulatory Visit
Admission: RE | Admit: 2021-05-12 | Discharge: 2021-05-12 | Disposition: A | Discharge status: Home / Self Care | Payer: Medicare Other | Source: Ambulatory Visit | Attending: Thoracic Surgery (Cardiothoracic Vascular Surgery) | Admitting: Thoracic Surgery (Cardiothoracic Vascular Surgery)

## 2021-05-12 ENCOUNTER — Other Ambulatory Visit: Payer: Self-pay

## 2021-05-12 ENCOUNTER — Other Ambulatory Visit (HOSPITAL_COMMUNITY): Payer: Self-pay

## 2021-05-12 ENCOUNTER — Encounter: Payer: Medicare Other | Admitting: *Deleted

## 2021-05-12 ENCOUNTER — Ambulatory Visit (INDEPENDENT_AMBULATORY_CARE_PROVIDER_SITE_OTHER): Payer: Self-pay | Admitting: Thoracic Surgery (Cardiothoracic Vascular Surgery)

## 2021-05-12 ENCOUNTER — Encounter: Payer: Self-pay | Admitting: Thoracic Surgery (Cardiothoracic Vascular Surgery)

## 2021-05-12 ENCOUNTER — Telehealth (HOSPITAL_COMMUNITY): Payer: Self-pay | Admitting: Pharmacy Technician

## 2021-05-12 VITALS — BP 107/71 | HR 84 | Resp 20 | Ht 70.0 in | Wt 209.0 lb

## 2021-05-12 DIAGNOSIS — J90 Pleural effusion, not elsewhere classified: Secondary | ICD-10-CM | POA: Diagnosis not present

## 2021-05-12 DIAGNOSIS — Z951 Presence of aortocoronary bypass graft: Secondary | ICD-10-CM

## 2021-05-12 DIAGNOSIS — J9811 Atelectasis: Secondary | ICD-10-CM | POA: Diagnosis not present

## 2021-05-12 DIAGNOSIS — Z9889 Other specified postprocedural states: Secondary | ICD-10-CM | POA: Diagnosis not present

## 2021-05-12 NOTE — Progress Notes (Signed)
Daily Session Note  Patient Details  Name: Edward [name] MRN: [id number] Date of Birth: 1956-09-24 Referring Provider:   Flowsheet Row Cardiac Rehab from 04/26/2021 in [facility] Cardiac and Pulmonary Rehab  Referring Provider [name]      Encounter Date: 05/12/2021  Check In:  Session Check In - 05/12/21 0825      Check-In   Supervising physician immediately available to respond to emergencies See telemetry face sheet for immediately available ER MD    Location [facility]-Cardiac & Pulmonary Rehab    Staff Present Heath Lark, RN, BSN, CCRP;Jessica [place], MA, RCEP, CCRP, CCET;Joseph [place] RCP,RRT,BSRT    Virtual Visit No    Medication changes reported     No    Fall or balance concerns reported    No    Warm-up and Cool-down Performed on first and last piece of equipment    Resistance Training Performed Yes    VAD Patient? No    PAD/SET Patient? No      Pain Assessment   Currently in Pain? No/denies              Social History   Tobacco Use  Smoking Status Former Smoker  . Packs/day: 1.00  . Years: 10.00  . Pack years: 10.00  . Types: Cigarettes  . Quit date: 12/11/2003  . Years since quitting: 17.4  Smokeless Tobacco Never Used    Goals Met:  Independence with exercise equipment Exercise tolerated well No report of cardiac concerns or symptoms  Goals Unmet:  Not Applicable  Comments: Pt able to follow exercise prescription today without complaint.  Will continue to monitor for progression.    Dr. Emily Filbert is Medical Director for [place].  Dr. Ottie Glazier is Medical Director for [facility] Pulmonary Rehabilitation.

## 2021-05-12 NOTE — Progress Notes (Signed)
      [address]       [place] [address]             [phone]        [facility] Medical Record #[id number] Date of Birth: 01-25-1956  Referring: Jake Bathe, MD Primary Care: Joaquim Nam, MD Primary Cardiologist:Mark Anne Fu, MD  Reason for visit:   follow-up  History of Present Illness:     Edward [name] presents for his 1 month appointment.  He has no complaints.  He has started cardiac rehab and is doing well  Physical Exam: BP 107/71   Pulse 84   Resp 20   Ht 5\' 10"  (1.778 m)   Wt 209 lb (94.8 kg)   SpO2 96% Comment: RA  BMI 29.99 kg/m   Alert NAD Incision clean.  Sternum stable Abdomen soft, ND no peripheral edema   Diagnostic Studies & Laboratory data: CXR: clear     Assessment / Plan:   65 yo male s/p CABG.  Doing well Follow-up PRN Continue cardiac rehab   76 05/12/2021 1:33 PM

## 2021-05-12 NOTE — Telephone Encounter (Signed)
Advanced Heart Failure Patient Advocate Encounter  Received a message that the patient could not afford [place]. 30 day co-pay is currently $47 and 90 days is $140. Started an application for [place] assistance. Called and left the patient a message to return my call.

## 2021-05-15 ENCOUNTER — Other Ambulatory Visit: Payer: Self-pay

## 2021-05-15 DIAGNOSIS — Z951 Presence of aortocoronary bypass graft: Secondary | ICD-10-CM

## 2021-05-15 NOTE — Progress Notes (Signed)
Daily Session Note  Patient Details  Name: Edward [name] MRN: [id number] Date of Birth: [birth year] Referring Provider:   Flowsheet Row Cardiac Rehab from 04/26/2021 in [facility] Cardiac and Pulmonary Rehab  Referring Provider [name]      Encounter Date: 05/15/2021  Check In:  Session Check In - 05/15/21 [place]      Check-In   Supervising physician immediately available to respond to emergencies See telemetry face sheet for immediately available ER MD    Location [facility]-Cardiac & Pulmonary Rehab    Staff Present Birdie Sons, MPA, RN;Melissa [place] RDN, Wilhelmina Mcardle, BS, ACSM CEP, Exercise Physiologist;Jessica Luan Pulling, MA, RCEP, CCRP, CCET    Virtual Visit No    Medication changes reported     No    Fall or balance concerns reported    No    Warm-up and Cool-down Performed on first and last piece of equipment    Resistance Training Performed Yes    VAD Patient? No    PAD/SET Patient? No      Pain Assessment   Currently in Pain? No/denies              Social History   Tobacco Use  Smoking Status Former Smoker  . Packs/day: 1.00  . Years: 10.00  . Pack years: 10.00  . Types: Cigarettes  . Quit date: 12/11/2003  . Years since quitting: 17.4  Smokeless Tobacco Never Used    Goals Met:  Independence with exercise equipment Exercise tolerated well No report of cardiac concerns or symptoms Strength training completed today  Goals Unmet:  Not Applicable  Comments: Pt able to follow exercise prescription today without complaint.  Will continue to monitor for progression.    Dr. Emily Filbert is Medical Director for [place].  Dr. Ottie Glazier is Medical Director for [facility] Pulmonary Rehabilitation.

## 2021-05-17 ENCOUNTER — Other Ambulatory Visit: Payer: Self-pay

## 2021-05-17 DIAGNOSIS — Z951 Presence of aortocoronary bypass graft: Secondary | ICD-10-CM | POA: Diagnosis not present

## 2021-05-17 DIAGNOSIS — Z9889 Other specified postprocedural states: Secondary | ICD-10-CM | POA: Diagnosis not present

## 2021-05-17 NOTE — Progress Notes (Signed)
Daily Session Note  Patient Details  Name: Edward [name] MRN: [id number] Date of Birth: [birth year] Referring Provider:   Flowsheet Row Cardiac Rehab from 04/26/2021 in [facility] Cardiac and Pulmonary Rehab  Referring Provider [name]      Encounter Date: 05/17/2021  Check In:  Session Check In - 05/17/21 1558      Check-In   Supervising physician immediately available to respond to emergencies See telemetry face sheet for immediately available ER MD    Location [facility]-Cardiac & Pulmonary Rehab    Staff Present Birdie Sons, MPA, RN;Joseph Lou [place], MS, ASCM CEP, Exercise Physiologist    Virtual Visit No    Medication changes reported     No    Fall or balance concerns reported    No    Warm-up and Cool-down Performed on first and last piece of equipment    Resistance Training Performed Yes    VAD Patient? No    PAD/SET Patient? No      Pain Assessment   Currently in Pain? No/denies              Social History   Tobacco Use  Smoking Status Former Smoker  . Packs/day: 1.00  . Years: 10.00  . Pack years: 10.00  . Types: Cigarettes  . Quit date: 12/11/2003  . Years since quitting: 17.4  Smokeless Tobacco Never Used    Goals Met:  Independence with exercise equipment Exercise tolerated well No report of cardiac concerns or symptoms Strength training completed today  Goals Unmet:  Not Applicable  Comments: Pt able to follow exercise prescription today without complaint.  Will continue to monitor for progression.    Dr. Emily Filbert is Medical Director for [place].  Dr. Ottie Glazier is Medical Director for [facility] Pulmonary Rehabilitation.

## 2021-05-18 ENCOUNTER — Other Ambulatory Visit: Payer: Self-pay

## 2021-05-18 DIAGNOSIS — Z9889 Other specified postprocedural states: Secondary | ICD-10-CM | POA: Diagnosis not present

## 2021-05-18 DIAGNOSIS — Z951 Presence of aortocoronary bypass graft: Secondary | ICD-10-CM | POA: Diagnosis not present

## 2021-05-18 NOTE — Progress Notes (Signed)
Daily Session Note  Patient Details  Name: Edward [name] MRN: [id number] Date of Birth: 11-06-1956 Referring Provider:   Flowsheet Row Cardiac Rehab from 04/26/2021 in [facility] Cardiac and Pulmonary Rehab  Referring Provider [name]       Encounter Date: 05/18/2021  Check In:  Session Check In - 05/18/21 1603       Check-In   Supervising physician immediately available to respond to emergencies See telemetry face sheet for immediately available ER MD    Location [facility]-Cardiac & Pulmonary Rehab    Staff Present Birdie Sons, MPA, RN;Joseph Darrin [place], [place], RCEP, CCRP, CCET    Virtual Visit No    Medication changes reported     No    Fall or balance concerns reported    No    Warm-up and Cool-down Performed on first and last piece of equipment    Resistance Training Performed Yes    VAD Patient? No    PAD/SET Patient? No      Pain Assessment   Currently in Pain? No/denies                Social History   Tobacco Use  Smoking Status Former   Packs/day: 1.00   Years: 10.00   Pack years: 10.00   Types: Cigarettes   Quit date: 12/11/2003   Years since quitting: 17.4  Smokeless Tobacco Never    Goals Met:  Independence with exercise equipment Exercise tolerated well No report of cardiac concerns or symptoms Strength training completed today  Goals Unmet:  Not Applicable  Comments: Pt able to follow exercise prescription today without complaint.  Will continue to monitor for progression.    Dr. Emily Filbert is Medical Director for [place].  Dr. Ottie Glazier is Medical Director for [facility] Pulmonary Rehabilitation.

## 2021-05-22 ENCOUNTER — Other Ambulatory Visit: Payer: Medicare Other

## 2021-05-22 ENCOUNTER — Other Ambulatory Visit: Payer: Self-pay

## 2021-05-22 ENCOUNTER — Encounter: Payer: Medicare Other | Admitting: *Deleted

## 2021-05-22 DIAGNOSIS — Z9889 Other specified postprocedural states: Secondary | ICD-10-CM | POA: Diagnosis not present

## 2021-05-22 DIAGNOSIS — Z951 Presence of aortocoronary bypass graft: Secondary | ICD-10-CM

## 2021-05-22 DIAGNOSIS — I5022 Chronic systolic (congestive) heart failure: Secondary | ICD-10-CM | POA: Diagnosis not present

## 2021-05-22 DIAGNOSIS — E785 Hyperlipidemia, unspecified: Secondary | ICD-10-CM | POA: Diagnosis not present

## 2021-05-22 DIAGNOSIS — N1831 Chronic kidney disease, stage 3a: Secondary | ICD-10-CM

## 2021-05-22 DIAGNOSIS — D696 Thrombocytopenia, unspecified: Secondary | ICD-10-CM

## 2021-05-22 DIAGNOSIS — I2510 Atherosclerotic heart disease of native coronary artery without angina pectoris: Secondary | ICD-10-CM

## 2021-05-22 DIAGNOSIS — I779 Disorder of arteries and arterioles, unspecified: Secondary | ICD-10-CM

## 2021-05-22 LAB — LIPID PANEL
Chol/HDL Ratio: 2.9 ratio (ref 0.0–5.0)
Cholesterol, Total: 99 mg/dL — ABNORMAL LOW (ref 100–199)
HDL: 34 mg/dL — ABNORMAL LOW (ref >39)
LDL Chol Calc (NIH): 50 mg/dL (ref 0–99)
Triglycerides: 72 mg/dL (ref 0–149)
VLDL Cholesterol Cal: 15 mg/dL (ref 5–40)

## 2021-05-22 LAB — HEPATIC FUNCTION PANEL
ALT: 22 IU/L (ref 0–44)
AST: 19 IU/L (ref 0–40)
Albumin: 3.9 g/dL (ref 3.8–4.8)
Alkaline Phosphatase: 89 IU/L (ref 44–121)
Bilirubin Total: 0.3 mg/dL (ref 0.0–1.2)
Bilirubin, Direct: <0.1 mg/dL (ref 0.00–0.40)
Total Protein: 6.5 g/dL (ref 6.0–8.5)

## 2021-05-22 NOTE — Progress Notes (Signed)
Daily Session Note  Patient Details  Name: Edward [name] MRN: [id number] Date of Birth: [birth year] Referring Provider:   Flowsheet Row Cardiac Rehab from 04/26/2021 in [facility] Cardiac and Pulmonary Rehab  Referring Provider [name]       Encounter Date: 05/22/2021  Check In:  Session Check In - 05/22/21 1606       Check-In   Supervising physician immediately available to respond to emergencies See telemetry face sheet for immediately available ER MD    Location [facility]-Cardiac & Pulmonary Rehab    Staff Present Heath Lark, RN, BSN, CCRP;Kelly Bollinger, MPA, Nino Glow, MS, ASCM CEP, Exercise Physiologist;Melissa Caiola RDN, LDN    Virtual Visit No    Medication changes reported     No    Fall or balance concerns reported    No    Warm-up and Cool-down Performed on first and last piece of equipment    Resistance Training Performed Yes    VAD Patient? No    PAD/SET Patient? No      Pain Assessment   Currently in Pain? No/denies                Social History   Tobacco Use  Smoking Status Former   Packs/day: 1.00   Years: 10.00   Pack years: 10.00   Types: Cigarettes   Quit date: 12/11/2003   Years since quitting: 17.4  Smokeless Tobacco Never    Goals Met:  Independence with exercise equipment Exercise tolerated well No report of cardiac concerns or symptoms  Goals Unmet:  Not Applicable  Comments: Pt able to follow exercise prescription today without complaint.  Will continue to monitor for progression.    Dr. Emily Filbert is Medical Director for [name].  Dr. Ottie Glazier is Medical Director for [name] Pulmonary Rehabilitation.

## 2021-05-24 ENCOUNTER — Encounter: Payer: Medicare Other | Admitting: *Deleted

## 2021-05-24 ENCOUNTER — Encounter: Payer: Self-pay | Admitting: *Deleted

## 2021-05-24 ENCOUNTER — Other Ambulatory Visit: Payer: Self-pay

## 2021-05-24 DIAGNOSIS — Z951 Presence of aortocoronary bypass graft: Secondary | ICD-10-CM | POA: Diagnosis not present

## 2021-05-24 DIAGNOSIS — Z9889 Other specified postprocedural states: Secondary | ICD-10-CM | POA: Diagnosis not present

## 2021-05-24 NOTE — Progress Notes (Signed)
Lmptcb

## 2021-05-24 NOTE — Progress Notes (Signed)
Cardiac Individual Treatment Plan  Patient Details  Name: Edward [name] MRN: [id number] Date of Birth: April 08, 1956 Referring Provider:   Flowsheet Row Cardiac Rehab from 04/26/2021 in [facility] Cardiac and Pulmonary Rehab  Referring Provider Shirlee Latch       Initial Encounter Date:  Flowsheet Row Cardiac Rehab from 04/26/2021 in [facility] Cardiac and Pulmonary Rehab  Date 04/26/21       Visit Diagnosis: S/P CABG x 3  Patient's Home Medications on Admission:  Current Outpatient Medications:    aspirin EC 325 MG EC tablet, Take 1 tablet (325 mg total) by mouth daily., Disp: 30 tablet, Rfl: 0   atorvastatin (LIPITOR) 80 MG tablet, Take 1 tablet (80 mg total) by mouth daily., Disp: 30 tablet, Rfl: 2   carvedilol (COREG) 3.125 MG tablet, Take 1 tablet (3.125 mg total) by mouth 2 (two) times daily., Disp: 60 tablet, Rfl: 3   dapagliflozin propanediol (FARXIGA) 10 MG TABS tablet, Take 1 tablet (10 mg total) by mouth daily before breakfast., Disp: 30 tablet, Rfl: 3   digoxin (LANOXIN) 0.125 MG tablet, Take 1 tablet (0.125 mg total) by mouth daily., Disp: 30 tablet, Rfl: 2   furosemide (LASIX) 20 MG tablet, Take 1 tablet (20 mg total) by mouth as needed (FOR 3 LB WEIGHT GAIN)., Disp: 45 tablet, Rfl: 3   loratadine (CLARITIN) 10 MG tablet, Take 10 mg by mouth daily as needed for allergies., Disp: , Rfl:    Multiple Vitamin (MULTIVITAMIN WITH MINERALS) TABS tablet, Take 1 tablet by mouth daily., Disp: , Rfl:    sacubitril-valsartan (ENTRESTO) 24-26 MG, Take 1 tablet by mouth 2 (two) times daily., Disp: 60 tablet, Rfl: 2  Past Medical History: Past Medical History:  Diagnosis Date   CAD (coronary artery disease)    a. STEMI 2004 with occluded Cx and 80% RCA s/p DESx2. b. CABG 03/2021.   Chronic systolic CHF (congestive heart failure) (HCC)    CKD (chronic kidney disease), stage III (HCC)    HCV antibody positive    previous eval at [facility] hepatology clinic   Ischemic cardiomyopathy    STEMI (ST  elevation myocardial infarction) (HCC) 2004   with totally occluded CFX (infarct related artery) and 80% mRCA stenosis. Taxus DES to both vessles. EF was 50-55% by LV-gram   Thrombocytopenia (HCC)     Tobacco Use: Social History   Tobacco Use  Smoking Status Former   Packs/day: 1.00   Years: 10.00   Pack years: 10.00   Types: Cigarettes   Quit date: 12/11/2003   Years since quitting: 17.4  Smokeless Tobacco Never    Labs: Recent Review Flowsheet Data     Labs for ITP Cardiac and Pulmonary Rehab Latest Ref Rng & Units 03/29/2021 03/30/2021 03/31/2021 04/01/2021 05/22/2021   Cholestrol 100 - 199 mg/dL - - - - 59(D)   LDLCALC 0 - 99 mg/dL - - - - 50   HDL >35 mg/dL - - - - 70(V)   Trlycerides 0 - 149 mg/dL - - - - 72   Hemoglobin A1c 4.8 - 5.6 % - - - - -   PHART 7.350 - 7.450 - - - - -   PCO2ART 32.0 - 48.0 mmHg - - - - -   HCO3 20.0 - 28.0 mmol/L - - - - -   TCO2 22 - 32 mmol/L - - - - -   ACIDBASEDEF 0.0 - 2.0 mmol/L - - - - -   O2SAT % 62.4 63.7 59.1 56.7 -  Exercise Target Goals: Exercise Program Goal: Individual exercise prescription set using results from initial 6 min walk test and THRR while considering  patient's activity barriers and safety.   Exercise Prescription Goal: Initial exercise prescription builds to 30-45 minutes a day of aerobic activity, 2-3 days per week.  Home exercise guidelines will be given to patient during program as part of exercise prescription that the participant will acknowledge.   Education: Aerobic Exercise: - Group verbal and visual presentation on the components of exercise prescription. Introduces F.I.T.T principle from ACSM for exercise prescriptions.  Reviews F.I.T.T. principles of aerobic exercise including progression. Written material given at graduation.   Education: Resistance Exercise: - Group verbal and visual presentation on the components of exercise prescription. Introduces F.I.T.T principle from ACSM for exercise  prescriptions  Reviews F.I.T.T. principles of resistance exercise including progression. Written material given at graduation.    Education: Exercise & Equipment Safety: - Individual verbal instruction and demonstration of equipment use and safety with use of the equipment. Flowsheet Row [facility]  Date 04/20/21  [facility]  Instruction Review Code 1- Verbalizes Understanding       Education: Exercise Physiology & General Exercise Guidelines: - Group verbal and written instruction with models to review the exercise physiology of the cardiovascular system and associated critical values. Provides general exercise guidelines with specific guidelines to those with heart or lung disease.    Education: Flexibility, Balance, Mind/Body Relaxation: - Group verbal and visual presentation with interactive activity on the components of exercise prescription. Introduces F.I.T.T principle from ACSM for exercise prescriptions. Reviews F.I.T.T. principles of flexibility and balance exercise training including progression. Also discusses the mind body connection.  Reviews various relaxation techniques to help reduce and manage stress (i.e. Deep breathing, progressive muscle relaxation, and visualization). Balance handout provided to take home. Written material given at graduation.   Activity Barriers & Risk Stratification:   6 Minute Walk:  6 Minute Walk     Row Name 04/26/21 1506         6 Minute Walk   Phase Initial     Distance 1405 feet     Walk Time 6 minutes     # of Rest Breaks 0     MPH 2.66     METS 3.5     RPE 7     Perceived Dyspnea  0     VO2 Peak 12.2     Symptoms No     Resting HR 91 bpm     Resting BP 106/60     Resting Oxygen Saturation  99 %     Exercise Oxygen Saturation  during 6 min walk 99 %     Max Ex. HR 121 bpm     Max Ex. BP 132/76     2 Minute Post BP 118/66              Oxygen Initial  Assessment:   Oxygen Re-Evaluation:   Oxygen Discharge (Final Oxygen Re-Evaluation):   Initial Exercise Prescription:  Initial Exercise Prescription - 04/26/21 1500       Date of Initial Exercise RX and Referring Provider   Date 04/26/21    Referring Provider Shirlee LatchMcLean      Treadmill   MPH 2.6    Grade 1    Minutes 15    METs 3.5      NuStep   Level 3    SPM 80    Minutes 15  METs 3.5      Recumbant Elliptical   Level 2    RPM 50    Minutes 15    METs 3.5      REL-XR   Level 3    Speed 50    Minutes 15    METs 3.5      Intensity   THRR 40-80% of Max Heartrate 116-142    Ratings of Perceived Exertion 11-13    Perceived Dyspnea 0-4      Resistance Training   Training Prescription Yes    Weight 3 lb    Reps 10-15             Perform Capillary Blood Glucose checks as needed.  Exercise Prescription Changes:   Exercise Prescription Changes     Row Name 04/26/21 1500 05/15/21 0900           Response to Exercise   Blood Pressure (Admit) 106/60 132/80      Blood Pressure (Exercise) 132/76 142/70      Blood Pressure (Exit) 118/66 102/62      Heart Rate (Admit) 91 bpm 78 bpm      Heart Rate (Exercise) 121 bpm 113 bpm      Heart Rate (Exit) 79 bpm 79 bpm      Oxygen Saturation (Admit) 99 % --      Oxygen Saturation (Exercise) 99 % --      Rating of Perceived Exertion (Exercise) 7 2.8      Perceived Dyspnea (Exercise) 0 --      Symptoms none none      Duration -- Continue with 30 min of aerobic exercise without signs/symptoms of physical distress.      Intensity -- THRR unchanged             Progression      Progression -- Continue to progress workloads to maintain intensity without signs/symptoms of physical distress.      Average METs -- 2.8             Resistance Training      Training Prescription -- Yes      Weight -- 3 lb      Reps -- 10-15             Treadmill      MPH -- 3      Grade -- 1      Minutes -- 15      METs --  3.71             T5 Nustep      Level -- 1      Minutes -- 15      METs -- 1.9              Exercise Comments:   Exercise Goals and Review:   Exercise Goals     Row Name 04/26/21 1511             Exercise Goals   Increase Physical Activity Yes       Intervention Provide advice, education, support and counseling about physical activity/exercise needs.;Develop an individualized exercise prescription for aerobic and resistive training based on initial evaluation findings, risk stratification, comorbidities and participant's personal goals.       Expected Outcomes Short Term: Attend rehab on a regular basis to increase amount of physical activity.;Long Term: Add in home exercise to make exercise part of routine and to increase amount of physical activity.;Long Term: Exercising regularly at least 3-5  days a week.       Increase Strength and Stamina Yes       Intervention Provide advice, education, support and counseling about physical activity/exercise needs.;Develop an individualized exercise prescription for aerobic and resistive training based on initial evaluation findings, risk stratification, comorbidities and participant's personal goals.       Expected Outcomes Short Term: Increase workloads from initial exercise prescription for resistance, speed, and METs.;Short Term: Perform resistance training exercises routinely during rehab and add in resistance training at home;Long Term: Improve cardiorespiratory fitness, muscular endurance and strength as measured by increased METs and functional capacity ( )       Able to understand and use rate of perceived exertion (RPE) scale Yes       Intervention Provide education and explanation on how to use RPE scale       Expected Outcomes Short Term: Able to use RPE daily in rehab to express subjective intensity level;Long Term:  Able to use RPE to guide intensity level when exercising independently       Able to understand and use  Dyspnea scale Yes       Intervention Provide education and explanation on how to use Dyspnea scale       Expected Outcomes Short Term: Able to use Dyspnea scale daily in rehab to express subjective sense of shortness of breath during exertion;Long Term: Able to use Dyspnea scale to guide intensity level when exercising independently       Knowledge and understanding of Target Heart Rate Range (THRR) Yes       Intervention Provide education and explanation of THRR including how the numbers were predicted and where they are located for reference       Expected Outcomes Short Term: Able to state/look up THRR;Short Term: Able to use daily as guideline for intensity in rehab;Long Term: Able to use THRR to govern intensity when exercising independently       Able to check pulse independently Yes       Intervention Provide education and demonstration on how to check pulse in carotid and radial arteries.;Review the importance of being able to check your own pulse for safety during independent exercise       Expected Outcomes Short Term: Able to explain why pulse checking is important during independent exercise;Long Term: Able to check pulse independently and accurately       Understanding of Exercise Prescription Yes       Intervention Provide education, explanation, and written materials on patient's individual exercise prescription       Expected Outcomes Short Term: Able to explain program exercise prescription;Long Term: Able to explain home exercise prescription to exercise independently                Exercise Goals Re-Evaluation :  Exercise Goals Re-Evaluation     Row Name 05/01/21 1108 05/10/21 1109 05/15/21 1001         Exercise Goal Re-Evaluation   Exercise Goals Review Able to understand and use rate of perceived exertion (RPE) scale;Knowledge and understanding of Target Heart Rate Range (THRR);Understanding of Exercise Prescription;Increase Strength and Stamina;Able to check pulse  independently;Improve claudication pain tolerance and improve walking ability;Increase Physical Activity Increase Physical Activity;Increase Strength and Stamina Increase Physical Activity;Increase Strength and Stamina     Comments Reviewed RPE and dyspnea scales, THR and program prescription with pt today.  Pt voiced understanding and was given a copy of goals to take home. Geovannie says he can definfitle tell he is  working when exercising in class.  He wants to go back to the gym in a couple weeks - staff will review home exercise. Manus has increased TM to 3 mph and 1%.  Staff will revew home exercise and monitor progress.     Expected Outcomes Short: Use RPE daily to regulate intensity. Long: Follow program prescription in THR. Short: attend consistently Long:  build stamina back Short: review home ex with staff Long: continue to build stamina              Discharge Exercise Prescription (Final Exercise Prescription Changes):  Exercise Prescription Changes - 05/15/21 0900       Response to Exercise   Blood Pressure (Admit) 132/80    Blood Pressure (Exercise) 142/70    Blood Pressure (Exit) 102/62    Heart Rate (Admit) 78 bpm    Heart Rate (Exercise) 113 bpm    Heart Rate (Exit) 79 bpm    Rating of Perceived Exertion (Exercise) 2.8    Symptoms none    Duration Continue with 30 min of aerobic exercise without signs/symptoms of physical distress.    Intensity THRR unchanged      Progression   Progression Continue to progress workloads to maintain intensity without signs/symptoms of physical distress.    Average METs 2.8      Resistance Training   Training Prescription Yes    Weight 3 lb    Reps 10-15      Treadmill   MPH 3    Grade 1    Minutes 15    METs 3.71      T5 Nustep   Level 1    Minutes 15    METs 1.9             Nutrition:  Target Goals: Understanding of nutrition guidelines, daily intake of sodium 1500mg , cholesterol 200mg , calories 30% from fat and  7% or less from saturated fats, daily to have 5 or more servings of fruits and vegetables.  Education: All About Nutrition: -Group instruction provided by verbal, written material, interactive activities, discussions, models, and posters to present general guidelines for heart healthy nutrition including fat, fiber, MyPlate, the role of sodium in heart healthy nutrition, utilization of the nutrition label, and utilization of this knowledge for meal planning. Follow up email sent as well. Written material given at graduation.   Biometrics:  Pre Biometrics - 04/26/21 1513       Pre Biometrics   Height 5\' 10"  (1.778 m)    Weight 205 lb 9.6 oz (93.3 kg)    BMI (Calculated) 29.5    Single Leg Stand 28.05 seconds              Nutrition Therapy Plan and Nutrition Goals:   Nutrition Assessments:  MEDIFICTS Score Key: ?70 Need to make dietary changes  40-70 Heart Healthy Diet ? 40 Therapeutic Level Cholesterol Diet  Flowsheet Row Cardiac Rehab from 04/26/2021 in [facility] Cardiac and Pulmonary Rehab  Picture Your Plate Total Score on Admission 73      Picture Your Plate Scores: <47 Unhealthy dietary pattern with much room for improvement. 41-50 Dietary pattern unlikely to meet recommendations for good health and room for improvement. 51-60 More healthful dietary pattern, with some room for improvement.  >60 Healthy dietary pattern, although there may be some specific behaviors that could be improved.    Nutrition Goals Re-Evaluation:   Nutrition Goals Discharge (Final Nutrition Goals Re-Evaluation):   Psychosocial: Target Goals: Acknowledge presence or absence  of significant depression and/or stress, maximize coping skills, provide positive support system. Participant is able to verbalize types and ability to use techniques and skills needed for reducing stress and depression.   Education: Stress, Anxiety, and Depression - Group verbal and visual presentation to define topics  covered.  Reviews how body is impacted by stress, anxiety, and depression.  Also discusses healthy ways to reduce stress and to treat/manage anxiety and depression.  Written material given at graduation.   Education: Sleep Hygiene -Provides group verbal and written instruction about how sleep can affect your health.  Define sleep hygiene, discuss sleep cycles and impact of sleep habits. Review good sleep hygiene tips.    Initial Review & Psychosocial Screening:  Initial Psych Review & Screening - 04/20/21 1314       Initial Review   Current issues with None Identified      Family Dynamics   Good Support System? Yes    Comments He has a good family support system. He has a wife and three children he can call if he needs support. Prentis has a positive outlook on his health.      Barriers   Psychosocial barriers to participate in program The patient should benefit from training in stress management and relaxation.;There are no identifiable barriers or psychosocial needs.      Screening Interventions   Interventions Encouraged to exercise;To provide support and resources with identified psychosocial needs;Provide feedback about the scores to participant    Expected Outcomes Short Term goal: Utilizing psychosocial counselor, staff and physician to assist with identification of specific Stressors or current issues interfering with healing process. Setting desired goal for each stressor or current issue identified.;Long Term Goal: Stressors or current issues are controlled or eliminated.;Short Term goal: Identification and review with participant of any Quality of Life or Depression concerns found by scoring the questionnaire.;Long Term goal: The participant improves quality of Life and PHQ9 Scores as seen by post scores and/or verbalization of changes             Quality of Life Scores:   Quality of Life - 04/26/21 1514       Quality of Life   Select Quality of Life      Quality of  Life Scores   Health/Function Pre 25.6 %    Socioeconomic Pre 25.83 %    Psych/Spiritual Pre 30 %    Family Pre 24.5 %    GLOBAL Pre 26.41 %            Scores of 19 and below usually indicate a poorer quality of life in these areas.  A difference of  2-3 points is a clinically meaningful difference.  A difference of 2-3 points in the total score of the Quality of Life Index has been associated with significant improvement in overall quality of life, self-image, physical symptoms, and general health in studies assessing change in quality of life.  PHQ-9: Recent Review Flowsheet Data     Depression screen [facility] 2/9 04/26/2021 02/03/2019 11/15/2017   Decreased Interest 2 0 0   Down, Depressed, Hopeless 0 0 0   PHQ - 2 Score 2 0 0   Altered sleeping 0 - -   Tired, decreased energy 0 - -   Change in appetite 1 - -   Feeling bad or failure about yourself  0 - -   Trouble concentrating 0 - -   Moving slowly or fidgety/restless 1 - -   Suicidal thoughts 0 - -  PHQ-9 Score 4 - -      Interpretation of Total Score  Total Score Depression Severity:  1-4 = Minimal depression, 5-9 = Mild depression, 10-14 = Moderate depression, 15-19 = Moderately severe depression, 20-27 = Severe depression   Psychosocial Evaluation and Intervention:  Psychosocial Evaluation - 04/20/21 1315       Psychosocial Evaluation & Interventions   Interventions Encouraged to exercise with the program and follow exercise prescription;Relaxation education;Stress management education    Comments He has a good family support system. He has a wife and three children he can call if he needs support. Marrion has a positive outlook on his health.    Expected Outcomes Short: Exercise regularly to support mental health and notify staff of any changes. Long: maintain mental health and well being through teaching of rehab or prescribed medications independently.    Continue Psychosocial Services  Follow up required by staff              Psychosocial Re-Evaluation:  Psychosocial Re-Evaluation     Row Name 05/10/21 1108             Psychosocial Re-Evaluation   Current issues with None Identified       Comments Zakaria states he has no stress concerns and sleeps well.       Expected Outcomes Short: notify staff/MD if any changes Long: maintain positive outlook                Psychosocial Discharge (Final Psychosocial Re-Evaluation):  Psychosocial Re-Evaluation - 05/10/21 1108       Psychosocial Re-Evaluation   Current issues with None Identified    Comments Tejas states he has no stress concerns and sleeps well.    Expected Outcomes Short: notify staff/MD if any changes Long: maintain positive outlook             Vocational Rehabilitation: Provide vocational rehab assistance to qualifying candidates.   Vocational Rehab Evaluation & Intervention:   Education: Education Goals: Education classes will be provided on a variety of topics geared toward better understanding of heart health and risk factor modification. Participant will state understanding/return demonstration of topics presented as noted by education test scores.  Learning Barriers/Preferences:  Learning Barriers/Preferences - 04/20/21 1309       Learning Barriers/Preferences   Learning Barriers None    Learning Preferences None             General Cardiac Education Topics:  AED/CPR: - Group verbal and written instruction with the use of models to demonstrate the basic use of the AED with the basic ABC's of resuscitation.   Anatomy and Cardiac Procedures: - Group verbal and visual presentation and models provide information about basic cardiac anatomy and function. Reviews the testing methods done to diagnose heart disease and the outcomes of the test results. Describes the treatment choices: Medical Management, Angioplasty, or Coronary Bypass Surgery for treating various heart conditions including Myocardial  Infarction, Angina, Valve Disease, and Cardiac Arrhythmias.  Written material given at graduation.   Medication Safety: - Group verbal and visual instruction to review commonly prescribed medications for heart and lung disease. Reviews the medication, class of the drug, and side effects. Includes the steps to properly store meds and maintain the prescription regimen.  Written material given at graduation.   Intimacy: - Group verbal instruction through game format to discuss how heart and lung disease can affect sexual intimacy. Written material given at graduation..   Know Your Numbers and Heart Failure: -  Group verbal and visual instruction to discuss disease risk factors for cardiac and pulmonary disease and treatment options.  Reviews associated critical values for Overweight/Obesity, Hypertension, Cholesterol, and Diabetes.  Discusses basics of heart failure: signs/symptoms and treatments.  Introduces Heart Failure Zone chart for action plan for heart failure.  Written material given at graduation.   Infection Prevention: - Provides verbal and written material to individual with discussion of infection control including proper hand washing and proper equipment cleaning during exercise session. Flowsheet Row Cardiac Rehab from 05/17/2021 in [facility] Cardiac and Pulmonary Rehab  Date 04/20/21  Educator [facility]  Instruction Review Code 1- Verbalizes Understanding       Falls Prevention: - Provides verbal and written material to individual with discussion of falls prevention and safety. Flowsheet Row Cardiac Rehab from 05/17/2021 in [facility] Cardiac and Pulmonary Rehab  Date 04/20/21  Educator [facility]  Instruction Review Code 1- Verbalizes Understanding       Other: -Provides group and verbal instruction on various topics (see comments)   Knowledge Questionnaire Score:   Core Components/Risk Factors/Patient Goals at Admission:  Personal Goals and Risk Factors at Admission - 04/26/21 1650        Core Components/Risk Factors/Patient Goals on Admission    Weight Management Yes;Weight Loss    Intervention Weight Management: Develop a combined nutrition and exercise program designed to reach desired caloric intake, while maintaining appropriate intake of nutrient and fiber, sodium and fats, and appropriate energy expenditure required for the weight goal.;Weight Management: Provide education and appropriate resources to help participant work on and attain dietary goals.;Weight Management/Obesity: Establish reasonable short term and long term weight goals.    Expected Outcomes Long Term: Adherence to nutrition and physical activity/exercise program aimed toward attainment of established weight goal;Short Term: Continue to assess and modify interventions until short term weight is achieved;Weight Loss: Understanding of general recommendations for a balanced deficit meal plan, which promotes 1-2 lb weight loss per week and includes a negative energy balance of [phone] kcal/d;Understanding recommendations for meals to include 15-35% energy as protein, 25-35% energy from fat, 35-60% energy from carbohydrates, less than 200mg  of dietary cholesterol, 20-35 gm of total fiber daily;Understanding of distribution of calorie intake throughout the day with the consumption of 4-5 meals/snacks    Heart Failure Yes    Intervention Provide a combined exercise and nutrition program that is supplemented with education, support and counseling about heart failure. Directed toward relieving symptoms such as shortness of breath, decreased exercise tolerance, and extremity edema.    Expected Outcomes Improve functional capacity of life;Short term: Attendance in program 2-3 days a week with increased exercise capacity. Reported lower sodium intake. Reported increased fruit and vegetable intake. Reports medication compliance.;Short term: Daily weights obtained and reported for increase. Utilizing diuretic protocols set by  physician.;Long term: Adoption of self-care skills and reduction of barriers for early signs and symptoms recognition and intervention leading to self-care maintenance.    Hypertension Yes    Intervention Provide education on lifestyle modifcations including regular physical activity/exercise, weight management, moderate sodium restriction and increased consumption of fresh fruit, vegetables, and low fat dairy, alcohol moderation, and smoking cessation.;Monitor prescription use compliance.    Expected Outcomes Short Term: Continued assessment and intervention until BP is < 140/63mm HG in hypertensive participants. < 130/30mm HG in hypertensive participants with diabetes, heart failure or chronic kidney disease.;Long Term: Maintenance of blood pressure at goal levels.    Lipids Yes    Intervention Provide education and support for participant  on nutrition & aerobic/resistive exercise along with prescribed medications to achieve LDL 70mg , HDL >40mg .    Expected Outcomes Short Term: Participant states understanding of desired cholesterol values and is compliant with medications prescribed. Participant is following exercise prescription and nutrition guidelines.;Long Term: Cholesterol controlled with medications as prescribed, with individualized exercise RX and with personalized nutrition plan. Value goals: LDL < 70mg , HDL > 40 mg.             Education:Diabetes - Individual verbal and written instruction to review signs/symptoms of diabetes, desired ranges of glucose level fasting, after meals and with exercise. Acknowledge that pre and post exercise glucose checks will be done for 3 sessions at entry of program.   Core Components/Risk Factors/Patient Goals Review:   Goals and Risk Factor Review     Row Name 05/10/21 1105             Core Components/Risk Factors/Patient Goals Review   Personal Goals Review Weight Management/Obesity;Hypertension;Lipids       Review Mitcheal reports taking  all meds as directed.  He does check BP at home and it is normally around 120's.  He will meet with RD Friday - no weight loss yet.       Expected Outcomes Short: continue to attend consistently and tale meds as directed Long:  manage risk factors long term                Core Components/Risk Factors/Patient Goals at Discharge (Final Review):   Goals and Risk Factor Review - 05/10/21 1105       Core Components/Risk Factors/Patient Goals Review   Personal Goals Review Weight Management/Obesity;Hypertension;Lipids    Review Makar reports taking all meds as directed.  He does check BP at home and it is normally around 120's.  He will meet with RD Friday - no weight loss yet.    Expected Outcomes Short: continue to attend consistently and tale meds as directed Long:  manage risk factors long term             ITP Comments:  ITP Comments     Row Name 04/20/21 1312 04/26/21 1657 05/01/21 1107 05/24/21 0729     ITP Comments Virtual Visit completed. Patient informed on EP and RD appointment and 6 Minute walk test. Patient also informed of patient health questionnaires on My Chart. Patient Verbalizes understanding. Visit diagnosis can be found in [facility] 04[facility]18/[facility]. Completed and gym orientation. Initial ITP created and sent for review to Dr. Bethann Punches, Medical Director. First full day of exercise!  Patient was oriented to gym and equipment including functions, settings, policies, and procedures.  Patient's individual exercise prescription and treatment plan were reviewed.  All starting workloads were established based on the results of the 6 minute walk test done at initial orientation visit.  The plan for exercise progression was also introduced and progression will be customized based on patient's performance and goals. 30 Day review completed. Medical Director ITP review done, changes made as directed, and signed approval by Medical Director.             Comments:

## 2021-05-24 NOTE — Progress Notes (Signed)
Daily Session Note  Patient Details  Name: Edward [name] MRN: [id number] Date of Birth: Nov 27, 1956 Referring Provider:   Flowsheet Row Cardiac Rehab from 04/26/2021 in [facility] Cardiac and Pulmonary Rehab  Referring Provider [name]       Encounter Date: 05/24/2021  Check In:  Session Check In - 05/24/21 1626       Check-In   Supervising physician immediately available to respond to emergencies See telemetry face sheet for immediately available ER MD    Location [facility]-Cardiac & Pulmonary Rehab    Staff Present Renita Papa, RN BSN;Joseph Lou Miner, [place], ASCM CEP, Exercise Physiologist    Virtual Visit No    Medication changes reported     No    Fall or balance concerns reported    No    Warm-up and Cool-down Performed on first and last piece of equipment    Resistance Training Performed Yes    VAD Patient? No    PAD/SET Patient? No      Pain Assessment   Currently in Pain? No/denies                Social History   Tobacco Use  Smoking Status Former   Packs/day: 1.00   Years: 10.00   Pack years: 10.00   Types: Cigarettes   Quit date: 12/11/2003   Years since quitting: 17.4  Smokeless Tobacco Never    Goals Met:  Independence with exercise equipment Exercise tolerated well No report of cardiac concerns or symptoms Strength training completed today  Goals Unmet:  Not Applicable  Comments: Pt able to follow exercise prescription today without complaint.  Will continue to monitor for progression.    Dr. Emily Filbert is Medical Director for [place].  Dr. Ottie Glazier is Medical Director for [facility] Pulmonary Rehabilitation.

## 2021-05-25 ENCOUNTER — Other Ambulatory Visit: Payer: Self-pay

## 2021-05-25 ENCOUNTER — Encounter: Payer: Medicare Other | Admitting: *Deleted

## 2021-05-25 DIAGNOSIS — Z951 Presence of aortocoronary bypass graft: Secondary | ICD-10-CM | POA: Diagnosis not present

## 2021-05-25 DIAGNOSIS — Z9889 Other specified postprocedural states: Secondary | ICD-10-CM | POA: Diagnosis not present

## 2021-05-25 NOTE — Progress Notes (Signed)
Daily Session Note  Patient Details  Name: Edward [name] MRN: [id number] Date of Birth: [birth year] Referring Provider:   Flowsheet Row Cardiac Rehab from 04/26/2021 in [facility] Cardiac and Pulmonary Rehab  Referring Provider [name]       Encounter Date: 05/25/2021  Check In:  Session Check In - 05/25/21 1555       Check-In   Supervising physician immediately available to respond to emergencies See telemetry face sheet for immediately available ER MD    Location [facility]-Cardiac & Pulmonary Rehab    Staff Present Renita Papa, RN BSN;Joseph Hood RCP,RRT,BSRT;Melissa Brockway RDN, LDN    Virtual Visit No    Medication changes reported     No    Fall or balance concerns reported    No    Warm-up and Cool-down Performed on first and last piece of equipment    Resistance Training Performed Yes    VAD Patient? No      Pain Assessment   Currently in Pain? No/denies                Social History   Tobacco Use  Smoking Status Former   Packs/day: 1.00   Years: 10.00   Pack years: 10.00   Types: Cigarettes   Quit date: 12/11/2003   Years since quitting: 17.4  Smokeless Tobacco Never    Goals Met:  Independence with exercise equipment Exercise tolerated well No report of cardiac concerns or symptoms Strength training completed today  Goals Unmet:  Not Applicable  Comments: Pt able to follow exercise prescription today without complaint.  Will continue to monitor for progression.    Dr. Emily Filbert is Medical Director for [place].  Dr. Ottie Glazier is Medical Director for [facility] Pulmonary Rehabilitation.

## 2021-05-29 ENCOUNTER — Other Ambulatory Visit: Payer: Self-pay

## 2021-05-29 ENCOUNTER — Encounter: Payer: Medicare Other | Admitting: *Deleted

## 2021-05-29 DIAGNOSIS — Z951 Presence of aortocoronary bypass graft: Secondary | ICD-10-CM | POA: Diagnosis not present

## 2021-05-29 DIAGNOSIS — Z9889 Other specified postprocedural states: Secondary | ICD-10-CM | POA: Diagnosis not present

## 2021-05-29 NOTE — Progress Notes (Signed)
Daily Session Note  Patient Details  Name: Edward [name] MRN: [id number] Date of Birth: [birth year] Referring Provider:   Flowsheet Row Cardiac Rehab from 04/26/2021 in [facility] Cardiac and Pulmonary Rehab  Referring Provider [name]       Encounter Date: 05/29/2021  Check In:  Session Check In - 05/29/21 1630       Check-In   Supervising physician immediately available to respond to emergencies See telemetry face sheet for immediately available ER MD    Location [facility]-Cardiac & Pulmonary Rehab    Staff Present Renita Papa, RN Moises Blood, BS, ACSM CEP, Exercise Physiologist;Kara Eliezer Bottom, MS, ASCM CEP, Exercise Physiologist    Virtual Visit No    Medication changes reported     No    Fall or balance concerns reported    No    Warm-up and Cool-down Performed on first and last piece of equipment    Resistance Training Performed Yes    VAD Patient? No    PAD/SET Patient? No      Pain Assessment   Currently in Pain? No/denies                Social History   Tobacco Use  Smoking Status Former   Packs/day: 1.00   Years: 10.00   Pack years: 10.00   Types: Cigarettes   Quit date: 12/11/2003   Years since quitting: 17.4  Smokeless Tobacco Never    Goals Met:  Independence with exercise equipment Exercise tolerated well No report of cardiac concerns or symptoms Strength training completed today  Goals Unmet:  Not Applicable  Comments: Pt able to follow exercise prescription today without complaint.  Will continue to monitor for progression.    Dr. Emily Filbert is Medical Director for [place].  Dr. Ottie Glazier is Medical Director for [facility] Pulmonary Rehabilitation.

## 2021-05-29 NOTE — Progress Notes (Signed)
Completed initial RD consultation ?

## 2021-05-31 ENCOUNTER — Other Ambulatory Visit: Payer: Self-pay

## 2021-05-31 DIAGNOSIS — Z951 Presence of aortocoronary bypass graft: Secondary | ICD-10-CM | POA: Diagnosis not present

## 2021-05-31 DIAGNOSIS — Z9889 Other specified postprocedural states: Secondary | ICD-10-CM | POA: Diagnosis not present

## 2021-05-31 NOTE — Progress Notes (Signed)
Daily Session Note  Patient Details  Name: Edward [name] MRN: [id number] Date of Birth: July 23, 1956 Referring Provider:   Flowsheet Row Cardiac Rehab from 04/26/2021 in [facility] Cardiac and Pulmonary Rehab  Referring Provider [name]       Encounter Date: 05/31/2021  Check In:  Session Check In - 05/31/21 [place]       Check-In   Supervising physician immediately available to respond to emergencies See telemetry face sheet for immediately available ER MD    Location [facility]-Cardiac & Pulmonary Rehab    Staff Present Birdie Sons, MPA, RN;Meredith Sherryll Burger, RN Margurite Auerbach, MS, ASCM CEP, Exercise Physiologist    Virtual Visit No    Medication changes reported     No    Fall or balance concerns reported    No    Warm-up and Cool-down Performed on first and last piece of equipment    Resistance Training Performed Yes    VAD Patient? No    PAD/SET Patient? No      Pain Assessment   Currently in Pain? No/denies                Social History   Tobacco Use  Smoking Status Former   Packs/day: 1.00   Years: 10.00   Pack years: 10.00   Types: Cigarettes   Quit date: 12/11/2003   Years since quitting: 17.4  Smokeless Tobacco Never    Goals Met:  Independence with exercise equipment Exercise tolerated well No report of cardiac concerns or symptoms Strength training completed today  Goals Unmet:  Not Applicable  Comments: Pt able to follow exercise prescription today without complaint.  Will continue to monitor for progression.    Dr. Emily Filbert is Medical Director for [place].  Dr. Ottie Glazier is Medical Director for [facility] Pulmonary Rehabilitation.

## 2021-06-01 ENCOUNTER — Encounter: Payer: Medicare Other | Admitting: *Deleted

## 2021-06-01 ENCOUNTER — Other Ambulatory Visit: Payer: Self-pay

## 2021-06-01 DIAGNOSIS — Z9889 Other specified postprocedural states: Secondary | ICD-10-CM | POA: Diagnosis not present

## 2021-06-01 DIAGNOSIS — Z951 Presence of aortocoronary bypass graft: Secondary | ICD-10-CM | POA: Diagnosis not present

## 2021-06-01 NOTE — Progress Notes (Signed)
Daily Session Note  Patient Details  Name: Edward [name] MRN: [id number] Date of Birth: Feb 24, 1956 Referring Provider:   Flowsheet Row Cardiac Rehab from 04/26/2021 in [facility] Cardiac and Pulmonary Rehab  Referring Provider [name]       Encounter Date: 06/01/2021  Check In:  Session Check In - 06/01/21 [place]       Check-In   Supervising physician immediately available to respond to emergencies See telemetry face sheet for immediately available ER MD    Location [facility]-Cardiac & Pulmonary Rehab    Staff Present Renita Papa, RN BSN;Joseph [address] [place], [place], [place], CCRP, CCET    Virtual Visit No    Medication changes reported     No    Fall or balance concerns reported    No    Warm-up and Cool-down Performed on first and last piece of equipment    Resistance Training Performed Yes    VAD Patient? No    PAD/SET Patient? No      Pain Assessment   Currently in Pain? No/denies                Social History   Tobacco Use  Smoking Status Former   Packs/day: 1.00   Years: 10.00   Pack years: 10.00   Types: Cigarettes   Quit date: 12/11/2003   Years since quitting: 17.4  Smokeless Tobacco Never    Goals Met:  Independence with exercise equipment Changing diet to healthy choices, watching portion sizes No report of cardiac concerns or symptoms Strength training completed today  Goals Unmet:  Not Applicable  Comments: Pt able to follow exercise prescription today without complaint.  Will continue to monitor for progression.    Dr. Emily Filbert is Medical Director for [place].  Dr. Ottie Glazier is Medical Director for [facility] Pulmonary Rehabilitation.

## 2021-06-05 ENCOUNTER — Other Ambulatory Visit: Payer: Self-pay

## 2021-06-05 DIAGNOSIS — Z951 Presence of aortocoronary bypass graft: Secondary | ICD-10-CM

## 2021-06-05 DIAGNOSIS — Z9889 Other specified postprocedural states: Secondary | ICD-10-CM | POA: Diagnosis not present

## 2021-06-05 NOTE — Progress Notes (Signed)
Daily Session Note  Patient Details  Name: Edward [name] MRN: [id number] Date of Birth: Jun 16, 1956 Referring Provider:   Flowsheet Row Cardiac Rehab from 04/26/2021 in [facility] Cardiac and Pulmonary Rehab  Referring Provider [name]       Encounter Date: 06/05/2021  Check In:  Session Check In - 06/05/21 1611       Check-In   Supervising physician immediately available to respond to emergencies See telemetry face sheet for immediately available ER MD    Location [facility]-Cardiac & Pulmonary Rehab    Staff Present Birdie Sons, MPA, Mauricia Area, BS, ACSM CEP, Exercise Physiologist;Kara Eliezer Bottom, MS, ASCM CEP, Exercise Physiologist;Meredith Sherryll Burger, RN BSN    Virtual Visit No    Medication changes reported     No    Fall or balance concerns reported    No    Warm-up and Cool-down Performed on first and last piece of equipment    Resistance Training Performed Yes    VAD Patient? No    PAD/SET Patient? No      Pain Assessment   Currently in Pain? No/denies                Social History   Tobacco Use  Smoking Status Former   Packs/day: 1.00   Years: 10.00   Pack years: 10.00   Types: Cigarettes   Quit date: 12/11/2003   Years since quitting: 17.4  Smokeless Tobacco Never    Goals Met:  Independence with exercise equipment Exercise tolerated well No report of cardiac concerns or symptoms Strength training completed today  Goals Unmet:  Not Applicable  Comments: Pt able to follow exercise prescription today without complaint.  Will continue to monitor for progression.    Dr. Emily Filbert is Medical Director for [place].  Dr. Ottie Glazier is Medical Director for [facility] Pulmonary Rehabilitation.

## 2021-06-07 ENCOUNTER — Other Ambulatory Visit (HOSPITAL_COMMUNITY): Payer: Self-pay

## 2021-06-07 ENCOUNTER — Other Ambulatory Visit: Payer: Self-pay

## 2021-06-07 ENCOUNTER — Telehealth (HOSPITAL_COMMUNITY): Payer: Self-pay | Admitting: Pharmacy Technician

## 2021-06-07 DIAGNOSIS — Z951 Presence of aortocoronary bypass graft: Secondary | ICD-10-CM

## 2021-06-07 DIAGNOSIS — Z9889 Other specified postprocedural states: Secondary | ICD-10-CM | POA: Diagnosis not present

## 2021-06-07 NOTE — Telephone Encounter (Signed)
Advanced Heart Failure Patient Advocate Encounter  Spoke with patient, currently PAN HF grant is open. Emailed patient grant information. This grant will cover the [place]. Current co-pay is $47. Will not seek [place] assistance at this time.   Member ID: [id number] Group ID: [id number] RxBin ID: [id number] PCN: [id number] Eligibility Start Date: 03/09/2021 Eligibility End Date: 06/06/2022 Assistance Amount: $1,000.00  Archer Asa, CPhT

## 2021-06-07 NOTE — Progress Notes (Signed)
Daily Session Note  Patient Details  Name: Kamarii Carton MRN: [id number] Date of Birth: [birth year] Referring Provider:   Flowsheet Row Cardiac Rehab from 04/26/2021 in [facility] Cardiac and Pulmonary Rehab  Referring Provider [name]       Encounter Date: 06/07/2021  Check In:  Session Check In - 06/07/21 1559       Check-In   Supervising physician immediately available to respond to emergencies See telemetry face sheet for immediately available ER MD    Location [facility]-Cardiac & Pulmonary Rehab    Staff Present Birdie Sons, MPA, Nino Glow, MS, ASCM CEP, Exercise Physiologist;Joseph Tessie Fass, [place]    Virtual Visit No    Medication changes reported     No    Fall or balance concerns reported    No    Warm-up and Cool-down Performed on first and last piece of equipment    Resistance Training Performed Yes    VAD Patient? No    PAD/SET Patient? No      Pain Assessment   Currently in Pain? No/denies                Social History   Tobacco Use  Smoking Status Former   Packs/day: 1.00   Years: 10.00   Pack years: 10.00   Types: Cigarettes   Quit date: 12/11/2003   Years since quitting: 17.5  Smokeless Tobacco Never    Goals Met:  Independence with exercise equipment Exercise tolerated well No report of cardiac concerns or symptoms Strength training completed today  Goals Unmet:  Not Applicable  Comments: Pt able to follow exercise prescription today without complaint.  Will continue to monitor for progression.    Dr. Emily Filbert is Medical Director for [place].  Dr. Ottie Glazier is Medical Director for [facility] Pulmonary Rehabilitation.

## 2021-06-08 ENCOUNTER — Other Ambulatory Visit: Payer: Self-pay

## 2021-06-08 ENCOUNTER — Ambulatory Visit (HOSPITAL_COMMUNITY)
Admission: RE | Admit: 2021-06-08 | Discharge: 2021-06-08 | Disposition: A | Discharge status: Home / Self Care | Payer: Medicare Other | Source: Ambulatory Visit | Attending: Internal Medicine | Admitting: Internal Medicine

## 2021-06-08 ENCOUNTER — Encounter: Payer: Medicare Other | Admitting: *Deleted

## 2021-06-08 ENCOUNTER — Encounter (HOSPITAL_COMMUNITY): Payer: Self-pay

## 2021-06-08 VITALS — BP 112/76 | HR 76 | Wt 214.0 lb

## 2021-06-08 DIAGNOSIS — R5383 Other fatigue: Secondary | ICD-10-CM | POA: Diagnosis not present

## 2021-06-08 DIAGNOSIS — I2510 Atherosclerotic heart disease of native coronary artery without angina pectoris: Secondary | ICD-10-CM | POA: Diagnosis present

## 2021-06-08 DIAGNOSIS — Z7982 Long term (current) use of aspirin: Secondary | ICD-10-CM | POA: Insufficient documentation

## 2021-06-08 DIAGNOSIS — Z9889 Other specified postprocedural states: Secondary | ICD-10-CM | POA: Diagnosis not present

## 2021-06-08 DIAGNOSIS — N183 Chronic kidney disease, stage 3 unspecified: Secondary | ICD-10-CM | POA: Insufficient documentation

## 2021-06-08 DIAGNOSIS — I5022 Chronic systolic (congestive) heart failure: Secondary | ICD-10-CM | POA: Diagnosis not present

## 2021-06-08 DIAGNOSIS — R42 Dizziness and giddiness: Secondary | ICD-10-CM | POA: Diagnosis not present

## 2021-06-08 DIAGNOSIS — E785 Hyperlipidemia, unspecified: Secondary | ICD-10-CM | POA: Insufficient documentation

## 2021-06-08 DIAGNOSIS — Z951 Presence of aortocoronary bypass graft: Secondary | ICD-10-CM | POA: Diagnosis not present

## 2021-06-08 DIAGNOSIS — I255 Ischemic cardiomyopathy: Secondary | ICD-10-CM | POA: Diagnosis not present

## 2021-06-08 LAB — BASIC METABOLIC PANEL
Anion gap: 6 (ref 5–15)
BUN: 21 mg/dL (ref 8–23)
CO2: 24 mmol/L (ref 22–32)
Calcium: 9 mg/dL (ref 8.9–10.3)
Chloride: 110 mmol/L (ref 98–111)
Creatinine, Ser: 1.43 mg/dL — ABNORMAL HIGH (ref 0.61–1.24)
GFR, Estimated: 54 mL/min — ABNORMAL LOW (ref >60)
Glucose, Bld: 129 mg/dL — ABNORMAL HIGH (ref 70–99)
Potassium: 4.3 mmol/L (ref 3.5–5.1)
Sodium: 140 mmol/L (ref 135–145)

## 2021-06-08 MED ORDER — SPIRONOLACTONE 25 MG PO TABS: 12.5000 mg | ORAL_TABLET | Freq: Every day | ORAL | 3 refills | Status: DC

## 2021-06-08 MED ORDER — METOPROLOL SUCCINATE ER 25 MG PO TB24: 12.5000 mg | ORAL_TABLET | Freq: Every day | ORAL | 3 refills | Status: DC

## 2021-06-08 NOTE — Patient Instructions (Signed)
It was a pleasure seeing you today!  MEDICATIONS: - We are changing your medications today - Stop the carvedilol 3.125mg  - Start taking metoprolol succinate 12.5mg  (1/2 tablet) by mouth daily - Start taking spironolactone 12.5mg  (1/2 tablet) by mouth daily - Call if you have questions about your medications.  LABS: -We will call you if your labs need attention.   NEXT APPOINTMENT: Return to clinic for labs in 1 week on July 7th at 9am Return to clinic in 3 weeks on July 20th for follow-up with Dr. Shirlee Latch  In general, to take care of your heart failure: -Limit your fluid intake to 2 Liters (half-gallon) per day.   -Limit your salt intake to ideally 2-3 grams (2000-3000 mg) per day. -Weigh yourself daily and record, and bring that "weight diary" to your next appointment.  (Weight gain of 2-3 pounds in 1 day typically means fluid weight.) -The medications for your heart are to help your heart and help you live longer.   -Please contact us before stopping any of your heart medications.  Call the clinic at [phone] with questions or to reschedule future appointments.

## 2021-06-08 NOTE — Progress Notes (Signed)
PCP: Dr Para March Primary Cardiologist: Dr Shirlee Latch CT Surgeon: Dr Cliffton Asters.  HPI:  Mr Patry is a 65 year old with a history of  CAD, MI 2004 DES x2 totally occluded CFX (infarct-related artery) and 80% mRCA stenosis with taxus DES to both vessels, hyperlipidemia. I seen an ECHO 2011 with EF 40-45%. He does not drink alcohol. Retired from [employer]. Recent ECHO from 03/2021 revealed EF 20-25%.   Admitted 03/20/21  for scheduled cath. Cath showed multivessel, pwp 30, and cardiac index 1.7. CT surgery consulted. Prior to surgery diuresed with IV lasix.  Had CABG x3. Post op course complicated by AKI. Drips slowly weaned off. GDMT limited by AKI.    Returned to HF clinic 4/29 with NP, patient was feeling fine with no major complaints. Farxiga 10mg  was added and Lasix was changed to as needed.   Returned to HF clinic on 5/16 with NP, pt says he took farxiga for 3 days but then stopped due to fatigue and dizziness.  Overall felt much better. Able to walk 15 minutes on treadmill without difficulty. Started cardiac rehab. Denied SOB/PND/Orthopnea. No chest pain.  Appetite improving. No fever or chills. Weight at home 200 pounds. Reported taking all medications.  Lives with his wife.  Today he returns to HF clinic for pharmacist medication titration. At last visit with NP, he was restarted on Farxiga 10 mg daily at bedtime and was started on carvedilol 3.125 mg BID.  Overall, he is feeling okay. He complains of ongoing fatigue and occasional dizziness when standing up too fast. No CP/palpitations. Breathing is good, no shortness of breath, LE edema, PND/orthopnea. Not much exercise outside of cardiac rehab due to fatigue. Weight at home 214 pounds. Has not had to take any doses of Lasix recently. Appetite good.   HF Medications: Carvedilol 3.125 mg BID Entresto 24/26 mg BID Farxiga 10 mg daily Digoxin 0.125 mg daily Furosemide 20 mg daily PRN  Has the patient been experiencing any side effects to the  medications prescribed?  Yes - increased fatigue likely from new carvedilol initiation   Does the patient have any problems obtaining medications due to transportation or finances?   Yes - 12-14-2002 was unaffordable, now has PAN grant for Marcelline Deist of regimen: good Understanding of indications: good Potential of compliance: good Patient understands to avoid NSAIDs. Patient understands to avoid decongestants.    Pertinent Lab Values: Serum creatinine 1.43, BUN 21, Potassium 4.3, Sodium 140, Digoxin 0.2   Vital Signs: Weight: 214 lbs (last clinic weight: 209 lbs) Blood pressure: 112/76 mmHg Heart rate: 76 bpm  Assessment/Plan: 1. CAD: Had MI in 2004 with DESx2 with totally occluded CFX (infarct-related artery) and 80% mRCA stenosis.  Patient had Taxus DES to both vessels.  Recently, noted to have EF down to 20-25% on echo, set up for coronary angiography. LHC on 4/11 showed high-grade subtotal stenoses of the LAD, left circumflex, and total occlusion of the previously placed stents in the circumflex marginal and RCA.  There are extensive collaterals supplying the distal RCA, and collaterals supplying the obtuse marginal vessel.  Collaterals are jeopardized due to the high-grade subtotal stenoses of the LAD and circumflex vessel.  There are excellent distal targets. CMRI with substantial viability.  03/10/21 patient had CABG with LIMA-LAD, SVG-OM, SVG-PDA.   - No chest pain.   - Continue ASA 81 mg daily - Continue atorvastatin 80 mg daily - Continue Farxiga 10 mg daily at bedtime - Stop carvedilol with persistent fatigue and will  trial metoprolol succinate 12.5 mg daily to see if this is better tolerated - Start spironolactone 12.5 mg daily. BMET today and repeat in 1 week.   2. Chronic systolic CHF: Echo in 4/22 with EF 20-25%, severe LV dilation, mildly dilated RV with normal systolic function, moderate MR. Ischemic cardiomyopathy.  RHC 4/11 with elevated PCWP and CI 1.7. cMRI  with LV EF 15%, RV EF 31%. - NYHA II. Volume status stable. He has not been taking lasix. Plan to continue lasix as needed.  - Continue Entresto 24/26 mg BID - Stop carvedilol and start metoprolol succinate 12.5 mg daily as above - Continue spironolactone 12.5 mg daily. - Continue Farxiga 10 mg daily at bedtime, he is tolerating okay.  - Start spironolactone 12.5 mg daily, BMET today and in 1 week - Continue digoxin 0.125 daily - Plan to check ECHO in 3 months after HF meds optimized.   3. CKD stage 3 - BMET today, recheck in 1 week   4. Hyperlipidemia: Atorvastatin 80 mg daily.    5. Thrombocytopenia: resolved  Follow up with lab in 1 week and then in 3 weeks with Dr. Siri Cole [facility]) [place], PharmD Student  Sharen Hones, PharmD, BCPS Advanced Heart Failure Clinic Pharmacist [phone]

## 2021-06-08 NOTE — Progress Notes (Signed)
Daily Session Note  Patient Details  Name: Edward [name] MRN: [id number] Date of Birth: Dec 14, 1955 Referring Provider:   Flowsheet Row Cardiac Rehab from 04/26/2021 in [facility] Cardiac and Pulmonary Rehab  Referring Provider [name]       Encounter Date: 06/08/2021  Check In:  Session Check In - 06/08/21 1556       Check-In   Supervising physician immediately available to respond to emergencies See telemetry face sheet for immediately available ER MD    Location [facility]-Cardiac & Pulmonary Rehab    Staff Present Renita Papa, RN BSN;Joseph [place], RCP,RRT,BSRT;Jessica La Rue, [place], RCEP, CCRP, CCET    Virtual Visit No    Medication changes reported     No    Fall or balance concerns reported    No    Warm-up and Cool-down Performed on first and last piece of equipment    Resistance Training Performed Yes    VAD Patient? No    PAD/SET Patient? No      Pain Assessment   Currently in Pain? No/denies                Social History   Tobacco Use  Smoking Status Former   Packs/day: 1.00   Years: 10.00   Pack years: 10.00   Types: Cigarettes   Quit date: 12/11/2003   Years since quitting: 17.5  Smokeless Tobacco Never    Goals Met:  Independence with exercise equipment Exercise tolerated well No report of cardiac concerns or symptoms Strength training completed today  Goals Unmet:  Not Applicable  Comments: Pt able to follow exercise prescription today without complaint.  Will continue to monitor for progression.    Dr. Emily Filbert is Medical Director for [place].  Dr. Ottie Glazier is Medical Director for [facility] Pulmonary Rehabilitation.

## 2021-06-13 ENCOUNTER — Other Ambulatory Visit: Payer: Self-pay

## 2021-06-13 ENCOUNTER — Encounter: Payer: Medicare Other | Attending: Cardiology | Admitting: *Deleted

## 2021-06-13 DIAGNOSIS — Z48812 Encounter for surgical aftercare following surgery on the circulatory system: Secondary | ICD-10-CM | POA: Diagnosis not present

## 2021-06-13 DIAGNOSIS — Z951 Presence of aortocoronary bypass graft: Secondary | ICD-10-CM | POA: Diagnosis not present

## 2021-06-13 NOTE — Progress Notes (Signed)
Daily Session Note  Patient Details  Name: Edward [name] MRN: [id number] Date of Birth: 1956-09-15 Referring Provider:   Flowsheet Row Cardiac Rehab from 04/26/2021 in [facility] Cardiac and Pulmonary Rehab  Referring Provider Edward [name]       Encounter Date: 06/13/2021  Check In:  Session Check In - 06/13/21 0816       Check-In   Supervising physician immediately available to respond to emergencies See telemetry face sheet for immediately available ER MD    Location [facility]-Cardiac & Pulmonary Rehab    Staff Present Heath Lark, RN, BSN, CCRP;Kristen Coble, RN,BC,MSN;Melissa Chumuckla, RDN, LDN;Jessica Gallina, MA, RCEP, CCRP, CCET    Virtual Visit No    Medication changes reported     No    Fall or balance concerns reported    No    Warm-up and Cool-down Performed on first and last piece of equipment    Resistance Training Performed Yes    VAD Patient? No    PAD/SET Patient? No      Pain Assessment   Currently in Pain? No/denies                Social History   Tobacco Use  Smoking Status Former   Packs/day: 1.00   Years: 10.00   Pack years: 10.00   Types: Cigarettes   Quit date: 12/11/2003   Years since quitting: 17.5  Smokeless Tobacco Never    Goals Met:  Independence with exercise equipment Exercise tolerated well No report of cardiac concerns or symptoms  Goals Unmet:  Not Applicable  Comments: Pt able to follow exercise prescription today without complaint.  Will continue to monitor for progression.    Dr. Emily Filbert is Medical Director for [place].  Dr. Ottie Glazier is Medical Director for [facility] Pulmonary Rehabilitation.

## 2021-06-14 ENCOUNTER — Other Ambulatory Visit (HOSPITAL_COMMUNITY): Payer: Self-pay | Admitting: Cardiology

## 2021-06-14 ENCOUNTER — Other Ambulatory Visit: Payer: Self-pay

## 2021-06-14 DIAGNOSIS — Z951 Presence of aortocoronary bypass graft: Secondary | ICD-10-CM | POA: Diagnosis not present

## 2021-06-14 DIAGNOSIS — Z48812 Encounter for surgical aftercare following surgery on the circulatory system: Secondary | ICD-10-CM | POA: Diagnosis not present

## 2021-06-14 DIAGNOSIS — I255 Ischemic cardiomyopathy: Secondary | ICD-10-CM

## 2021-06-14 NOTE — Progress Notes (Signed)
e

## 2021-06-14 NOTE — Progress Notes (Signed)
Daily Session Note  Patient Details  Name: Edward [name] MRN: [id number] Date of Birth: 29-Jul-1956 Referring Provider:   Flowsheet Row Cardiac Rehab from 04/26/2021 in [facility] Cardiac and Pulmonary Rehab  Referring Provider [name]       Encounter Date: 06/14/2021  Check In:  Session Check In - 06/14/21 1557       Check-In   Supervising physician immediately available to respond to emergencies See telemetry face sheet for immediately available ER MD    Location [facility]-Cardiac & Pulmonary Rehab    Staff Present Birdie Sons, MPA, Nino Glow, MS, ASCM CEP, Exercise Physiologist;Joseph Tessie Fass, [place]    Virtual Visit No    Medication changes reported     No    Fall or balance concerns reported    No    Warm-up and Cool-down Performed on first and last piece of equipment    Resistance Training Performed Yes    VAD Patient? No    PAD/SET Patient? No      Pain Assessment   Currently in Pain? No/denies                Social History   Tobacco Use  Smoking Status Former   Packs/day: 1.00   Years: 10.00   Pack years: 10.00   Types: Cigarettes   Quit date: 12/11/2003   Years since quitting: 17.5  Smokeless Tobacco Never    Goals Met:  Independence with exercise equipment Exercise tolerated well No report of cardiac concerns or symptoms Strength training completed today  Goals Unmet:  Not Applicable  Comments: Pt able to follow exercise prescription today without complaint.  Will continue to monitor for progression.    Dr. Emily Filbert is Medical Director for [place].  Dr. Ottie Glazier is Medical Director for [facility] Pulmonary Rehabilitation.

## 2021-06-15 ENCOUNTER — Ambulatory Visit (HOSPITAL_COMMUNITY)
Admission: RE | Admit: 2021-06-15 | Discharge: 2021-06-15 | Disposition: A | Discharge status: Home / Self Care | Payer: Medicare Other | Source: Ambulatory Visit | Attending: Cardiology | Admitting: Cardiology

## 2021-06-15 ENCOUNTER — Other Ambulatory Visit: Payer: Self-pay

## 2021-06-15 ENCOUNTER — Encounter: Payer: Medicare Other | Admitting: *Deleted

## 2021-06-15 DIAGNOSIS — I255 Ischemic cardiomyopathy: Secondary | ICD-10-CM | POA: Insufficient documentation

## 2021-06-15 DIAGNOSIS — Z48812 Encounter for surgical aftercare following surgery on the circulatory system: Secondary | ICD-10-CM | POA: Diagnosis not present

## 2021-06-15 DIAGNOSIS — Z951 Presence of aortocoronary bypass graft: Secondary | ICD-10-CM

## 2021-06-15 LAB — BASIC METABOLIC PANEL
Anion gap: 5 (ref 5–15)
BUN: 20 mg/dL (ref 8–23)
CO2: 26 mmol/L (ref 22–32)
Calcium: 8.9 mg/dL (ref 8.9–10.3)
Chloride: 107 mmol/L (ref 98–111)
Creatinine, Ser: 1.47 mg/dL — ABNORMAL HIGH (ref 0.61–1.24)
GFR, Estimated: 53 mL/min — ABNORMAL LOW (ref >60)
Glucose, Bld: 170 mg/dL — ABNORMAL HIGH (ref 70–99)
Potassium: 4.2 mmol/L (ref 3.5–5.1)
Sodium: 138 mmol/L (ref 135–145)

## 2021-06-15 NOTE — Progress Notes (Signed)
Daily Session Note  Patient Details  Name: Edward [name] MRN: [id number] Date of Birth: 12/06/1956 Referring Provider:   Flowsheet Row Cardiac Rehab from 04/26/2021 in [facility] Cardiac and Pulmonary Rehab  Referring Provider [place]       Encounter Date: 06/15/2021  Check In:  Session Check In - 06/15/21 1555       Check-In   Supervising physician immediately available to respond to emergencies See telemetry face sheet for immediately available ER MD    Location [facility]-Cardiac & Pulmonary Rehab    Staff Present Meredith Craven, RN BSN;Melissa Caiola, RDN, LDN;Kara Langdon, MS, ASCM CEP, Exercise Physiologist    Virtual Visit No    Medication changes reported     No    Fall or balance concerns reported    No    Warm-up and Cool-down Performed on first and last piece of equipment    Resistance Training Performed Yes    VAD Patient? No    PAD/SET Patient? No      Pain Assessment   Currently in Pain? No/denies                Social History   Tobacco Use  Smoking Status Former   Packs/day: 1.00   Years: 10.00   Pack years: 10.00   Types: Cigarettes   Quit date: 12/11/2003   Years since quitting: 17.5  Smokeless Tobacco Never    Goals Met:  Independence with exercise equipment Exercise tolerated well No report of cardiac concerns or symptoms Strength training completed today  Goals Unmet:  Not Applicable  Comments: Pt able to follow exercise prescription today without complaint.  Will continue to monitor for progression.    Dr. Mark Miller is Medical Director for [facility].  Dr. Fuad Aleskerov is Medical Director for [facility] Pulmonary Rehabilitation. 

## 2021-06-19 ENCOUNTER — Encounter: Payer: Medicare Other | Admitting: *Deleted

## 2021-06-19 ENCOUNTER — Other Ambulatory Visit: Payer: Self-pay

## 2021-06-19 VITALS — Ht 70.0 in | Wt 212.1 lb

## 2021-06-19 DIAGNOSIS — Z48812 Encounter for surgical aftercare following surgery on the circulatory system: Secondary | ICD-10-CM | POA: Diagnosis not present

## 2021-06-19 DIAGNOSIS — Z951 Presence of aortocoronary bypass graft: Secondary | ICD-10-CM

## 2021-06-19 NOTE — Progress Notes (Signed)
Daily Session Note  Patient Details  Name: Edward [name] MRN: [id number] Date of Birth: 27-Jun-1956 Referring Provider:   Flowsheet Row Cardiac Rehab from 04/26/2021 in [facility] Cardiac and Pulmonary Rehab  Referring Provider [name]       Encounter Date: 06/19/2021  Check In:  Session Check In - 06/19/21 1656       Check-In   Supervising physician immediately available to respond to emergencies See telemetry face sheet for immediately available ER MD    Location [facility]-Cardiac & Pulmonary Rehab    Staff Present Renita Papa, RN Moises Blood, BS, ACSM CEP, Exercise Physiologist;Kelly Rosalia Hammers, MPA, RN    Virtual Visit No    Medication changes reported     No    Fall or balance concerns reported    No    Warm-up and Cool-down Performed on first and last piece of equipment    Resistance Training Performed Yes    VAD Patient? No    PAD/SET Patient? No      Pain Assessment   Currently in Pain? No/denies                Social History   Tobacco Use  Smoking Status Former   Packs/day: 1.00   Years: 10.00   Pack years: 10.00   Types: Cigarettes   Quit date: 12/11/2003   Years since quitting: 17.5  Smokeless Tobacco Never    Goals Met:  Independence with exercise equipment Exercise tolerated well No report of cardiac concerns or symptoms Strength training completed today  Goals Unmet:  Not Applicable  Comments: Pt able to follow exercise prescription today without complaint.  Will continue to monitor for progression.    Dr. Emily Filbert is Medical Director for [place].  Dr. Ottie Glazier is Medical Director for [facility] Pulmonary Rehabilitation.

## 2021-06-20 NOTE — Patient Instructions (Signed)
Discharge Patient Instructions  Patient Details  Name: Edward [name] MRN: [id number] Date of Birth: 01-Feb-1956 Referring Provider:  Larey Dresser, MD   Number of Visits: 27  Reason for Discharge:  Patient reached a stable level of exercise. Patient independent in their exercise. Patient has met program and personal goals.  Smoking History:  Social History   Tobacco Use  Smoking Status Former   Packs/day: 1.00   Years: 10.00   Pack years: 10.00   Types: Cigarettes   Quit date: 12/11/2003   Years since quitting: 17.5  Smokeless Tobacco Never    Diagnosis:  S/P CABG x 3  Initial Exercise Prescription:  Initial Exercise Prescription - 04/26/21 1500       Date of Initial Exercise RX and Referring Provider   Date 04/26/21    Referring Provider [name]      Treadmill   MPH 2.6    Grade 1    Minutes 15    METs 3.5      NuStep   Level 3    SPM 80    Minutes 15    METs 3.5      Recumbant Elliptical   Level 2    RPM 50    Minutes 15    METs 3.5      REL-XR   Level 3    Speed 50    Minutes 15    METs 3.5      Intensity   THRR 40-80% of Max Heartrate 116-142    Ratings of Perceived Exertion 11-13    Perceived Dyspnea 0-4      Resistance Training   Training Prescription Yes    Weight 3 lb    Reps 10-15             Discharge Exercise Prescription (Final Exercise Prescription Changes):  Exercise Prescription Changes - 06/15/21 0700       Response to Exercise   Blood Pressure (Admit) 112/60    Blood Pressure (Exercise) 122/74    Blood Pressure (Exit) 114/70    Heart Rate (Admit) 79 bpm    Heart Rate (Exercise) 126 bpm    Heart Rate (Exit) 77 bpm    Rating of Perceived Exertion (Exercise) 13    Symptoms none    Duration Continue with 30 min of aerobic exercise without signs/symptoms of physical distress.    Intensity THRR unchanged      Progression   Progression Continue to progress workloads to maintain intensity without  signs/symptoms of physical distress.    Average METs 4.57      Resistance Training   Training Prescription Yes    Weight 5 lb    Reps 10-15      Interval Training   Interval Training Yes    Equipment Treadmill    Comments 3.6 speed, with 5.2 speed      Treadmill   MPH 3.7    Grade 2.5    Minutes 15    METs 5.11      Recumbant Elliptical   Level 3    Minutes 15    METs 2.5      REL-XR   Level 7    Speed 60    Minutes 15    METs 4.2      Home Exercise Plan   Plans to continue exercise at Home (comment)   Treadmill and bike   Frequency Add 2 additional days to program exercise sessions.   Start with 1 day  Initial Home Exercises Provided 05/24/21             Functional Capacity:  6 Minute Walk     Row Name 04/26/21 1506 06/19/21 1624       6 Minute Walk   Phase Initial Discharge    Distance 1405 feet 1750 feet    Distance % Change -- 25 %    Distance Feet Change -- 345 ft    Walk Time 6 minutes --    # of Rest Breaks 0 0    MPH 2.66 3.31    METS 3.5 3.98    RPE 7 12    Perceived Dyspnea  0 0    VO2 Peak 12.2 13.94    Symptoms No No    Resting HR 91 bpm 81 bpm    Resting BP 106/60 110/62    Resting Oxygen Saturation  99 % 95 %    Exercise Oxygen Saturation  during 6 min walk 99 % 90 %    Max Ex. HR 121 bpm 108 bpm    Max Ex. BP 132/76 142/70    2 Minute Post BP 118/66 --              Nutrition & Weight - Outcomes:  Pre Biometrics - 04/26/21 1513       Pre Biometrics   Height '5\' 10"'  (1.778 m)    Weight 205 lb 9.6 oz (93.3 kg)    BMI (Calculated) 29.5    Single Leg Stand 28.05 seconds             Post Biometrics - 06/19/21 1627        Post  Biometrics   Height '5\' 10"'  (1.778 m)    Weight 212 lb 1.6 oz (96.2 kg)    BMI (Calculated) 30.43    Single Leg Stand 30 seconds             Nutrition:  Nutrition Therapy & Goals - 05/29/21 1047       Nutrition Therapy   Diet Heart healthy, low Na    Protein (specify units) 75g     Fiber 30 grams    Whole Grain Foods 3 servings    Saturated Fats 12 max. grams    Fruits and Vegetables 8 servings/day    Sodium 1.5 grams      Personal Nutrition Goals   Nutrition Goal ST: Add 1 carbohydrate rich food, preferably complex carbohydrate, with at least 1 meal per day LT: Manage hunger during the day    Comments B: oatmeal and blueberries and honey (most time tries to avoid breakfast, but he gets hungry) L: chicken breast, spinach and broccoli 3x/day and then he will eat out the rest of the time. D: chicken breast or tuna and vegetables like lunch. He will cook with coconut oil and butter, when he has a salad he will have olive oil. He will put chicken wings in the airfrier. He will eat hamburger and steak every now and then. He will sometimes eat corn and beans. He will eat 9 to 10pm - second dinner. He feels very full when he finishes a meal, but will get hungry 2-3 hours after. Discussed how to make meals more satiating and discussed hunger cues. Discussed heart healhty eaitng.      Intervention Plan   Intervention Prescribe, educate and counsel regarding individualized specific dietary modifications aiming towards targeted core components such as weight, hypertension, lipid management, diabetes, heart failure and other comorbidities.;Nutrition handout(s)  given to patient.    Expected Outcomes Short Term Goal: Understand basic principles of dietary content, such as calories, fat, sodium, cholesterol and nutrients.;Short Term Goal: A plan has been developed with personal nutrition goals set during dietitian appointment.;Long Term Goal: Adherence to prescribed nutrition plan.            Education Questionnaire Score:   Goals reviewed with patient; copy given to patient.

## 2021-06-21 ENCOUNTER — Encounter: Payer: Self-pay | Admitting: *Deleted

## 2021-06-21 ENCOUNTER — Encounter: Payer: Medicare Other | Admitting: *Deleted

## 2021-06-21 ENCOUNTER — Other Ambulatory Visit: Payer: Self-pay

## 2021-06-21 DIAGNOSIS — Z951 Presence of aortocoronary bypass graft: Secondary | ICD-10-CM

## 2021-06-21 DIAGNOSIS — Z48812 Encounter for surgical aftercare following surgery on the circulatory system: Secondary | ICD-10-CM | POA: Diagnosis not present

## 2021-06-21 NOTE — Progress Notes (Signed)
Daily Session Note  Patient Details  Name: Edward [name] MRN: [id number] Date of Birth: 09/03/1956 Referring Provider:   Flowsheet Row Cardiac Rehab from 04/26/2021 in [facility] Cardiac and Pulmonary Rehab  Referring Provider [name]       Encounter Date: 06/21/2021  Check In:  Session Check In - 06/21/21 1629       Check-In   Supervising physician immediately available to respond to emergencies See telemetry face sheet for immediately available ER MD    Location [facility]-Cardiac & Pulmonary Rehab    Staff Present Renita Papa, RN Sherryl Barters, MPA, Nino Glow, MS, ASCM CEP, Exercise Physiologist;Melissa Caiola, RDN, LDN    Virtual Visit No    Medication changes reported     No    Fall or balance concerns reported    No    Warm-up and Cool-down Performed on first and last piece of equipment    Resistance Training Performed Yes    VAD Patient? No    PAD/SET Patient? No      Pain Assessment   Currently in Pain? No/denies                Social History   Tobacco Use  Smoking Status Former   Packs/day: 1.00   Years: 10.00   Pack years: 10.00   Types: Cigarettes   Quit date: 12/11/2003   Years since quitting: 17.5  Smokeless Tobacco Never    Goals Met:  Independence with exercise equipment Exercise tolerated well No report of cardiac concerns or symptoms Strength training completed today  Goals Unmet:  Not Applicable  Comments: Pt able to follow exercise prescription today without complaint.  Will continue to monitor for progression.    Dr. Emily Filbert is Medical Director for [place].  Dr. Ottie Glazier is Medical Director for [facility] Pulmonary Rehabilitation.

## 2021-06-21 NOTE — Progress Notes (Signed)
Cardiac Individual Treatment Plan  Patient Details  Name: Thierry Dobosz MRN: [id number] Date of Birth: 1956/09/19 Referring Provider:   Flowsheet Row Cardiac Rehab from 04/26/2021 in [facility] Cardiac and Pulmonary Rehab  Referring Provider [name]       Initial Encounter Date:  Flowsheet Row Cardiac Rehab from 04/26/2021 in [facility] Cardiac and Pulmonary Rehab  Date 04/26/21       Visit Diagnosis: S/P CABG x 3  Patient's Home Medications on Admission:  Current Outpatient Medications:    aspirin EC 325 MG EC tablet, Take 1 tablet (325 mg total) by mouth daily., Disp: 30 tablet, Rfl: 0   atorvastatin (LIPITOR) 80 MG tablet, Take 1 tablet (80 mg total) by mouth daily., Disp: 30 tablet, Rfl: 2   dapagliflozin propanediol (FARXIGA) 10 MG TABS tablet, Take 1 tablet (10 mg total) by mouth daily before breakfast., Disp: 30 tablet, Rfl: 3   digoxin (LANOXIN) 0.125 MG tablet, Take 1 tablet (0.125 mg total) by mouth daily., Disp: 30 tablet, Rfl: 2   furosemide (LASIX) 20 MG tablet, Take 1 tablet (20 mg total) by mouth as needed (FOR 3 LB WEIGHT GAIN)., Disp: 45 tablet, Rfl: 3   loratadine (CLARITIN) 10 MG tablet, Take 10 mg by mouth daily as needed for allergies., Disp: , Rfl:    metoprolol succinate (TOPROL-XL) 25 MG 24 hr tablet, Take 0.5 tablets (12.5 mg total) by mouth daily., Disp: 15 tablet, Rfl: 3   Multiple Vitamin (MULTIVITAMIN WITH MINERALS) TABS tablet, Take 1 tablet by mouth daily., Disp: , Rfl:    sacubitril-valsartan (ENTRESTO) 24-26 MG, Take 1 tablet by mouth 2 (two) times daily., Disp: 60 tablet, Rfl: 2   spironolactone (ALDACTONE) 25 MG tablet, Take 0.5 tablets (12.5 mg total) by mouth daily., Disp: 15 tablet, Rfl: 3  Past Medical History: Past Medical History:  Diagnosis Date   CAD (coronary artery disease)    a. STEMI 2004 with occluded Cx and 80% RCA s/p DESx2. b. CABG 03/2021.   Chronic systolic CHF (congestive heart failure) (HCC)    CKD (chronic kidney disease), stage III  (HCC)    HCV antibody positive    previous eval at [facility] hepatology clinic   Ischemic cardiomyopathy    STEMI (ST elevation myocardial infarction) ([place]) 2004   with totally occluded CFX (infarct related artery) and 80% mRCA stenosis. Taxus DES to both vessles. EF was 50-55% by LV-gram   Thrombocytopenia ([place])     Tobacco Use: Social History   Tobacco Use  Smoking Status Former   Packs/day: 1.00   Years: 10.00   Pack years: 10.00   Types: Cigarettes   Quit date: 12/11/2003   Years since quitting: 17.5  Smokeless Tobacco Never    Labs: Recent Review Flowsheet Data     Labs for ITP Cardiac and Pulmonary Rehab Latest Ref Rng & Units 03/29/2021 03/30/2021 03/31/2021 04/01/2021 05/22/2021   Cholestrol 100 - 199 mg/dL - - - - 99(L)   LDLCALC 0 - 99 mg/dL - - - - 50   HDL >39 mg/dL - - - - 34(L)   Trlycerides 0 - 149 mg/dL - - - - 72   Hemoglobin A1c 4.8 - 5.6 % - - - - -   PHART 7.350 - 7.450 - - - - -   PCO2ART 32.0 - 48.0 mmHg - - - - -   HCO3 20.0 - 28.0 mmol/L - - - - -   TCO2 22 - 32 mmol/L - - - - -  ACIDBASEDEF 0.0 - 2.0 mmol/L - - - - -   O2SAT % 62.4 63.7 59.1 56.7 -        Exercise Target Goals: Exercise Program Goal: Individual exercise prescription set using results from initial 6 min walk test and THRR while considering  patient's activity barriers and safety.   Exercise Prescription Goal: Initial exercise prescription builds to 30-45 minutes a day of aerobic activity, 2-3 days per week.  Home exercise guidelines will be given to patient during program as part of exercise prescription that the participant will acknowledge.   Education: Aerobic Exercise: - Group verbal and visual presentation on the components of exercise prescription. Introduces F.I.T.T principle from ACSM for exercise prescriptions.  Reviews F.I.T.T. principles of aerobic exercise including progression. Written material given at graduation. Flowsheet Row Cardiac Rehab from 06/07/2021 in [facility] Cardiac  and Pulmonary Rehab  Date 05/31/21  Educator [place]  Instruction Review Code 1- Verbalizes Understanding       Education: Resistance Exercise: - Group verbal and visual presentation on the components of exercise prescription. Introduces F.I.T.T principle from ACSM for exercise prescriptions  Reviews F.I.T.T. principles of resistance exercise including progression. Written material given at graduation. Flowsheet Row Cardiac Rehab from 06/07/2021 in [facility] Cardiac and Pulmonary Rehab  Date 06/07/21  Educator [place]  Instruction Review Code 1- Verbalizes Understanding        Education: Exercise & Equipment Safety: - Individual verbal instruction and demonstration of equipment use and safety with use of the equipment. Flowsheet Row Cardiac Rehab from 06/07/2021 in [facility] Cardiac and Pulmonary Rehab  Date 04/20/21  Educator [facility]  Instruction Review Code 1- Verbalizes Understanding       Education: Exercise Physiology & General Exercise Guidelines: - Group verbal and written instruction with models to review the exercise physiology of the cardiovascular system and associated critical values. Provides general exercise guidelines with specific guidelines to those with heart or lung disease.  Flowsheet Row Cardiac Rehab from 06/07/2021 in [facility] Cardiac and Pulmonary Rehab  Date 05/24/21  Educator AS  Instruction Review Code 1- Verbalizes Understanding       Education: Flexibility, Balance, Mind/Body Relaxation: - Group verbal and visual presentation with interactive activity on the components of exercise prescription. Introduces F.I.T.T principle from ACSM for exercise prescriptions. Reviews F.I.T.T. principles of flexibility and balance exercise training including progression. Also discusses the mind body connection.  Reviews various relaxation techniques to help reduce and manage stress (i.e. Deep breathing, progressive muscle relaxation, and visualization). Balance handout provided to take home.  Written material given at graduation.   Activity Barriers & Risk Stratification:   6 Minute Walk:  [place] Name 04/26/21 1506 06/19/21 1624       6 Minute Walk   Phase Initial Discharge    Distance 1405 feet 1750 feet    Distance % Change -- 25 %    Distance Feet Change -- 345 ft    Walk Time 6 minutes --    # of Rest Breaks 0 0    MPH 2.66 3.31    METS 3.5 3.98    RPE 7 12    Perceived Dyspnea  0 0    VO2 Peak 12.2 13.94    Symptoms No No    Resting HR 91 bpm 81 bpm    Resting BP 106/60 110/62    Resting Oxygen Saturation  99 % 95 %    Exercise Oxygen Saturation  during 6 min walk 99 %  90 %    Max Ex. HR 121 bpm 108 bpm    Max Ex. BP 132/76 142/70    2 Minute Post BP 118/66 --             Oxygen Initial Assessment:   Oxygen Re-Evaluation:   Oxygen Discharge (Final Oxygen Re-Evaluation):   Initial Exercise Prescription:  Initial Exercise Prescription - 04/26/21 1500       Date of Initial Exercise RX and Referring Provider   Date 04/26/21    Referring Provider [name]      Treadmill   MPH 2.6    Grade 1    Minutes 15    METs 3.5      NuStep   Level 3    SPM 80    Minutes 15    METs 3.5      Recumbant Elliptical   Level 2    RPM 50    Minutes 15    METs 3.5      REL-XR   Level 3    Speed 50    Minutes 15    METs 3.5      Intensity   THRR 40-80% of Max Heartrate 116-142    Ratings of Perceived Exertion 11-13    Perceived Dyspnea 0-4      Resistance Training   Training Prescription Yes    Weight 3 lb    Reps 10-15             Perform Capillary Blood Glucose checks as needed.  Exercise Prescription Changes:   Exercise Prescription Changes     Row Name 04/26/21 1500 05/15/21 0900 05/24/21 1700 05/30/21 1400 06/15/21 0700     Response to Exercise   Blood Pressure (Admit) 106/60 132/80 -- 110/70 112/60   Blood Pressure (Exercise) 132/76 142/70 -- 130/60 122/74   Blood Pressure (Exit) 118/66 102/62 --  108/68 114/70   Heart Rate (Admit) 91 bpm 78 bpm -- 81 bpm 79 bpm   Heart Rate (Exercise) 121 bpm 113 bpm -- 138 bpm 126 bpm   Heart Rate (Exit) 79 bpm 79 bpm -- 98 bpm 77 bpm   Oxygen Saturation (Admit) 99 % -- -- -- --   Oxygen Saturation (Exercise) 99 % -- -- -- --   Rating of Perceived Exertion (Exercise) 7 2.8 -- 13 13   Perceived Dyspnea (Exercise) 0 -- -- -- --   Symptoms none none -- none none   Duration -- Continue with 30 min of aerobic exercise without signs/symptoms of physical distress. -- Continue with 30 min of aerobic exercise without signs/symptoms of physical distress. Continue with 30 min of aerobic exercise without signs/symptoms of physical distress.   Intensity -- THRR unchanged -- THRR unchanged THRR unchanged     Progression   Progression -- Continue to progress workloads to maintain intensity without signs/symptoms of physical distress. -- Continue to progress workloads to maintain intensity without signs/symptoms of physical distress. Continue to progress workloads to maintain intensity without signs/symptoms of physical distress.   Average METs -- 2.8 -- 3.75 4.57     Resistance Training   Training Prescription -- Yes -- Yes Yes   Weight -- 3 lb -- 5 lb 5 lb   Reps -- 10-15 -- 10-15 10-15     Interval Training   Interval Training -- -- -- No Yes   Equipment -- -- -- -- Treadmill   Comments -- -- -- -- 3.6 speed, with 5.2 speed     Treadmill  MPH -- 3 -- 3.6 3.7   Grade -- 1 -- 1 2.5   Minutes -- 15 -- 15 15   METs -- 3.71 -- 4.25 5.11     Recumbant Elliptical   Level -- -- -- 3 3   Minutes -- -- -- 15 15   METs -- -- -- 2.5 2.5     REL-XR   Level -- -- -- 5 7   Speed -- -- -- -- 60   Minutes -- -- -- 15 15   METs -- -- -- 4.5 4.2     T5 Nustep   Level -- 1 -- -- --   Minutes -- 15 -- -- --   METs -- 1.9 -- -- --     Home Exercise Plan   Plans to continue exercise at -- -- Home (comment)  Treadmill and bike Home (comment)  Treadmill and bike  Home (comment)  Treadmill and bike   Frequency -- -- Add 2 additional days to program exercise sessions.  Start with 1 day Add 2 additional days to program exercise sessions.  Start with 1 day Add 2 additional days to program exercise sessions.  Start with 1 day   Initial Home Exercises Provided -- -- 05/24/21 05/24/21 05/24/21            Exercise Comments:   Exercise Goals and Review:   Exercise Goals     Row Name 04/26/21 1511             Exercise Goals   Increase Physical Activity Yes       Intervention Provide advice, education, support and counseling about physical activity/exercise needs.;Develop an individualized exercise prescription for aerobic and resistive training based on initial evaluation findings, risk stratification, comorbidities and participant's personal goals.       Expected Outcomes Short Term: Attend rehab on a regular basis to increase amount of physical activity.;Long Term: Add in home exercise to make exercise part of routine and to increase amount of physical activity.;Long Term: Exercising regularly at least 3-5 days a week.       Increase Strength and Stamina Yes       Intervention Provide advice, education, support and counseling about physical activity/exercise needs.;Develop an individualized exercise prescription for aerobic and resistive training based on initial evaluation findings, risk stratification, comorbidities and participant's personal goals.       Expected Outcomes Short Term: Increase workloads from initial exercise prescription for resistance, speed, and METs.;Short Term: Perform resistance training exercises routinely during rehab and add in resistance training at home;Long Term: Improve cardiorespiratory fitness, muscular endurance and strength as measured by increased METs and functional capacity (6MWT)       Able to understand and use rate of perceived exertion (RPE) scale Yes       Intervention Provide education and explanation on how  to use RPE scale       Expected Outcomes Short Term: Able to use RPE daily in rehab to express subjective intensity level;Long Term:  Able to use RPE to guide intensity level when exercising independently       Able to understand and use Dyspnea scale Yes       Intervention Provide education and explanation on how to use Dyspnea scale       Expected Outcomes Short Term: Able to use Dyspnea scale daily in rehab to express subjective sense of shortness of breath during exertion;Long Term: Able to use Dyspnea scale to guide intensity level when exercising independently  Knowledge and understanding of Target Heart Rate Range (THRR) Yes       Intervention Provide education and explanation of THRR including how the numbers were predicted and where they are located for reference       Expected Outcomes Short Term: Able to state/look up THRR;Short Term: Able to use daily as guideline for intensity in rehab;Long Term: Able to use THRR to govern intensity when exercising independently       Able to check pulse independently Yes       Intervention Provide education and demonstration on how to check pulse in carotid and radial arteries.;Review the importance of being able to check your own pulse for safety during independent exercise       Expected Outcomes Short Term: Able to explain why pulse checking is important during independent exercise;Long Term: Able to check pulse independently and accurately       Understanding of Exercise Prescription Yes       Intervention Provide education, explanation, and written materials on patient's individual exercise prescription       Expected Outcomes Short Term: Able to explain program exercise prescription;Long Term: Able to explain home exercise prescription to exercise independently                Exercise Goals Re-Evaluation :  Exercise Goals Re-Evaluation     Row Name 05/01/21 1108 05/10/21 1109 05/15/21 1001 05/24/21 1701 05/30/21 1408     Exercise  Goal Re-Evaluation   Exercise Goals Review Able to understand and use rate of perceived exertion (RPE) scale;Knowledge and understanding of Target Heart Rate Range (THRR);Understanding of Exercise Prescription;Increase Strength and Stamina;Able to check pulse independently;Improve claudication pain tolerance and improve walking ability;Increase Physical Activity Increase Physical Activity;Increase Strength and Stamina Increase Physical Activity;Increase Strength and Stamina Increase Physical Activity;Increase Strength and Stamina Increase Physical Activity;Increase Strength and Stamina;Understanding of Exercise Prescription   Comments Reviewed RPE and dyspnea scales, THR and program prescription with pt today.  Pt voiced understanding and was given a copy of goals to take home. Khairi says he can definfitle tell he is working when exercising in class.  He wants to go back to the gym in a couple weeks - staff will review home exercise. [place] has increased TM to 3 mph and 1%.  Staff will revew home exercise and monitor progress. Reviewed home exercise with pt today.  Pt plans to use his treadmill and bike at home for exercise.  Reviewed THR, pulse, RPE, sign and symptoms, pulse oximetery and when to call 911 or MD.  Also discussed weather considerations and indoor options.  Pt voiced understanding. Rigdon is doing well in rehab.  He is up to level 5 on the XR.  We will continue to monitor his progress.   Expected Outcomes Short: Use RPE daily to regulate intensity. Long: Follow program prescription in THR. Short: attend consistently Long:  build stamina back Short: review home ex with staff Long: continue to build stamina Short: Add on 1 day of exercise at home Long: Exercise at home independently at appropriate exercise prescription Short: Get incline back up on treadmill Long: Continue to improve stamina.    [place] Name 06/15/21 0749             Exercise Goal Re-Evaluation   Comments Kairen is progressing  well. He started doing intervals on the TM, ranging around 3.1 speed to 5.5 speed with a 2% incline. XR is now up to level 7. Will continue to monitor progress  Expected Outcomes Short: Start intervals with XR Long: Continue to increase overall MET level                Discharge Exercise Prescription (Final Exercise Prescription Changes):  Exercise Prescription Changes - 06/15/21 0700       Response to Exercise   Blood Pressure (Admit) 112/60    Blood Pressure (Exercise) 122/74    Blood Pressure (Exit) 114/70    Heart Rate (Admit) 79 bpm    Heart Rate (Exercise) 126 bpm    Heart Rate (Exit) 77 bpm    Rating of Perceived Exertion (Exercise) 13    Symptoms none    Duration Continue with 30 min of aerobic exercise without signs/symptoms of physical distress.    Intensity THRR unchanged      Progression   Progression Continue to progress workloads to maintain intensity without signs/symptoms of physical distress.    Average METs 4.57      Resistance Training   Training Prescription Yes    Weight 5 lb    Reps 10-15      Interval Training   Interval Training Yes    Equipment Treadmill    Comments 3.6 speed, with 5.2 speed      Treadmill   MPH 3.7    Grade 2.5    Minutes 15    METs 5.11      Recumbant Elliptical   Level 3    Minutes 15    METs 2.5      REL-XR   Level 7    Speed 60    Minutes 15    METs 4.2      Home Exercise Plan   Plans to continue exercise at Home (comment)   Treadmill and bike   Frequency Add 2 additional days to program exercise sessions.   Start with 1 day   Initial Home Exercises Provided 05/24/21             Nutrition:  Target Goals: Understanding of nutrition guidelines, daily intake of sodium <1579m, cholesterol <2018m calories 30% from fat and 7% or less from saturated fats, daily to have 5 or more servings of fruits and vegetables.  Education: All About Nutrition: -Group instruction provided by verbal, written  material, interactive activities, discussions, models, and posters to present general guidelines for heart healthy nutrition including fat, fiber, MyPlate, the role of sodium in heart healthy nutrition, utilization of the nutrition label, and utilization of this knowledge for meal planning. Follow up email sent as well. Written material given at graduation.   Biometrics:  Pre Biometrics - 04/26/21 1513       Pre Biometrics   Height 5' 10" (1.778 m)    Weight 205 lb 9.6 oz (93.3 kg)    BMI (Calculated) 29.5    Single Leg Stand 28.05 seconds             Post Biometrics - 06/19/21 1627        Post  Biometrics   Height 5' 10" (1.778 m)    Weight 212 lb 1.6 oz (96.2 kg)    BMI (Calculated) 30.43    Single Leg Stand 30 seconds             Nutrition Therapy Plan and Nutrition Goals:  Nutrition Therapy & Goals - 05/29/21 1047       Nutrition Therapy   Diet Heart healthy, low Na    Protein (specify units) 75g    Fiber 30 grams  Whole Grain Foods 3 servings    Saturated Fats 12 max. grams    Fruits and Vegetables 8 servings/day    Sodium 1.5 grams      Personal Nutrition Goals   Nutrition Goal ST: Add 1 carbohydrate rich food, preferably complex carbohydrate, with at least 1 meal per day LT: Manage hunger during the day    Comments B: oatmeal and blueberries and honey (most time tries to avoid breakfast, but he gets hungry) L: chicken breast, spinach and broccoli 3x/day and then he will eat out the rest of the time. D: chicken breast or tuna and vegetables like lunch. He will cook with coconut oil and butter, when he has a salad he will have olive oil. He will put chicken wings in the airfrier. He will eat hamburger and steak every now and then. He will sometimes eat corn and beans. He will eat 9 to 10pm - second dinner. He feels very full when he finishes a meal, but will get hungry 2-3 hours after. Discussed how to make meals more satiating and discussed hunger cues.  Discussed heart healhty eaitng.      Intervention Plan   Intervention Prescribe, educate and counsel regarding individualized specific dietary modifications aiming towards targeted core components such as weight, hypertension, lipid management, diabetes, heart failure and other comorbidities.;Nutrition handout(s) given to patient.    Expected Outcomes Short Term Goal: Understand basic principles of dietary content, such as calories, fat, sodium, cholesterol and nutrients.;Short Term Goal: A plan has been developed with personal nutrition goals set during dietitian appointment.;Long Term Goal: Adherence to prescribed nutrition plan.             Nutrition Assessments:  MEDIFICTS Score Key: ?70 Need to make dietary changes  40-70 Heart Healthy Diet ? 40 Therapeutic Level Cholesterol Diet  Flowsheet Row Cardiac Rehab from 04/26/2021 in [facility] Cardiac and Pulmonary Rehab  Picture Your Plate Total Score on Admission 73      Picture Your Plate Scores: <16 Unhealthy dietary pattern with much room for improvement. 41-50 Dietary pattern unlikely to meet recommendations for good health and room for improvement. 51-60 More healthful dietary pattern, with some room for improvement.  >60 Healthy dietary pattern, although there may be some specific behaviors that could be improved.    Nutrition Goals Re-Evaluation:   Nutrition Goals Discharge (Final Nutrition Goals Re-Evaluation):   Psychosocial: Target Goals: Acknowledge presence or absence of significant depression and/or stress, maximize coping skills, provide positive support system. Participant is able to verbalize types and ability to use techniques and skills needed for reducing stress and depression.   Education: Stress, Anxiety, and Depression - Group verbal and visual presentation to define topics covered.  Reviews how body is impacted by stress, anxiety, and depression.  Also discusses healthy ways to reduce stress and to  treat/manage anxiety and depression.  Written material given at graduation.   Education: Sleep Hygiene -Provides group verbal and written instruction about how sleep can affect your health.  Define sleep hygiene, discuss sleep cycles and impact of sleep habits. Review good sleep hygiene tips.    Initial Review & Psychosocial Screening:  Initial Psych Review & Screening - 04/20/21 1314       Initial Review   Current issues with None Identified      Family Dynamics   Good Support System? Yes    Comments He has a good family support system. He has a wife and three children he can call if he needs support.  Nishanth has a positive outlook on his health.      Barriers   Psychosocial barriers to participate in program The patient should benefit from training in stress management and relaxation.;There are no identifiable barriers or psychosocial needs.      Screening Interventions   Interventions Encouraged to exercise;To provide support and resources with identified psychosocial needs;Provide feedback about the scores to participant    Expected Outcomes Short Term goal: Utilizing psychosocial counselor, staff and physician to assist with identification of specific Stressors or current issues interfering with healing process. Setting desired goal for each stressor or current issue identified.;Long Term Goal: Stressors or current issues are controlled or eliminated.;Short Term goal: Identification and review with participant of any Quality of Life or Depression concerns found by scoring the questionnaire.;Long Term goal: The participant improves quality of Life and PHQ9 Scores as seen by post scores and/or verbalization of changes             Quality of Life Scores:   Quality of Life - 04/26/21 1514       Quality of Life   Select Quality of Life      Quality of Life Scores   Health/Function Pre 25.6 %    Socioeconomic Pre 25.83 %    Psych/Spiritual Pre 30 %    Family Pre 24.5 %     GLOBAL Pre 26.41 %            Scores of 19 and below usually indicate a poorer quality of life in these areas.  A difference of  2-3 points is a clinically meaningful difference.  A difference of 2-3 points in the total score of the Quality of Life Index has been associated with significant improvement in overall quality of life, self-image, physical symptoms, and general health in studies assessing change in quality of life.  PHQ-9: Recent Review Flowsheet Data     Depression screen [facility] 2/9 04/26/2021 02/03/2019 11/15/2017   Decreased Interest 2 0 0   Down, Depressed, Hopeless 0 0 0   PHQ - 2 Score 2 0 0   Altered sleeping 0 - -   Tired, decreased energy 0 - -   Change in appetite 1 - -   Feeling bad or failure about yourself  0 - -   Trouble concentrating 0 - -   Moving slowly or fidgety/restless 1 - -   Suicidal thoughts 0 - -   PHQ-9 Score 4 - -      Interpretation of Total Score  Total Score Depression Severity:  1-4 = Minimal depression, 5-9 = Mild depression, 10-14 = Moderate depression, 15-19 = Moderately severe depression, 20-27 = Severe depression   Psychosocial Evaluation and Intervention:  Psychosocial Evaluation - 04/20/21 1315       Psychosocial Evaluation & Interventions   Interventions Encouraged to exercise with the program and follow exercise prescription;Relaxation education;Stress management education    Comments He has a good family support system. He has a wife and three children he can call if he needs support. Tayquan has a positive outlook on his health.    Expected Outcomes Short: Exercise regularly to support mental health and notify staff of any changes. Long: maintain mental health and well being through teaching of rehab or prescribed medications independently.    Continue Psychosocial Services  Follow up required by staff             Psychosocial Re-Evaluation:  Psychosocial Re-Evaluation     [place] Name 05/10/21 1108  06/01/21 1621            Psychosocial Re-Evaluation   Current issues with None Identified None Identified      Comments Ladavion states he has no stress concerns and sleeps well. Patient reports no issues with their current mental states, sleep, stress, depression or anxiety. Will follow up with patient in a few weeks for any changes.      Expected Outcomes Short: notify staff/MD if any changes Long: maintain positive outlook Short: Continue to exercise regularly to support mental health and notify staff of any changes. Long: maintain mental health and well being through teaching of rehab or prescribed medications independently.      Interventions -- Encouraged to attend Cardiac Rehabilitation for the exercise      Continue Psychosocial Services  -- Follow up required by staff               Psychosocial Discharge (Final Psychosocial Re-Evaluation):  Psychosocial Re-Evaluation - 06/01/21 1621       Psychosocial Re-Evaluation   Current issues with None Identified    Comments Patient reports no issues with their current mental states, sleep, stress, depression or anxiety. Will follow up with patient in a few weeks for any changes.    Expected Outcomes Short: Continue to exercise regularly to support mental health and notify staff of any changes. Long: maintain mental health and well being through teaching of rehab or prescribed medications independently.    Interventions Encouraged to attend Cardiac Rehabilitation for the exercise    Continue Psychosocial Services  Follow up required by staff             Vocational Rehabilitation: Provide vocational rehab assistance to qualifying candidates.   Vocational Rehab Evaluation & Intervention:   Education: Education Goals: Education classes will be provided on a variety of topics geared toward better understanding of heart health and risk factor modification. Participant will state understanding/return demonstration of topics presented as noted by education test  scores.  Learning Barriers/Preferences:  Learning Barriers/Preferences - 04/20/21 1309       Learning Barriers/Preferences   Learning Barriers None    Learning Preferences None             General Cardiac Education Topics:  AED/CPR: - Group verbal and written instruction with the use of models to demonstrate the basic use of the AED with the basic ABC's of resuscitation.   Anatomy and Cardiac Procedures: - Group verbal and visual presentation and models provide information about basic cardiac anatomy and function. Reviews the testing methods done to diagnose heart disease and the outcomes of the test results. Describes the treatment choices: Medical Management, Angioplasty, or Coronary Bypass Surgery for treating various heart conditions including Myocardial Infarction, Angina, Valve Disease, and Cardiac Arrhythmias.  Written material given at graduation. Flowsheet Row Cardiac Rehab from 06/07/2021 in [facility] Cardiac and Pulmonary Rehab  Date 06/07/21  Educator [facility]  Instruction Review Code 1- Verbalizes Understanding       Medication Safety: - Group verbal and visual instruction to review commonly prescribed medications for heart and lung disease. Reviews the medication, class of the drug, and side effects. Includes the steps to properly store meds and maintain the prescription regimen.  Written material given at graduation.   Intimacy: - Group verbal instruction through game format to discuss how heart and lung disease can affect sexual intimacy. Written material given at graduation.. Flowsheet Row Cardiac Rehab from 06/07/2021 in [facility] Cardiac and Pulmonary Rehab  Date 05/31/21  Educator [facility]  Instruction Review Code 1- Verbalizes Understanding       Know Your Numbers and Heart Failure: - Group verbal and visual instruction to discuss disease risk factors for cardiac and pulmonary disease and treatment options.  Reviews associated critical values for Overweight/Obesity,  Hypertension, Cholesterol, and Diabetes.  Discusses basics of heart failure: signs/symptoms and treatments.  Introduces Heart Failure Zone chart for action plan for heart failure.  Written material given at graduation.   Infection Prevention: - Provides verbal and written material to individual with discussion of infection control including proper hand washing and proper equipment cleaning during exercise session. Flowsheet Row Cardiac Rehab from 06/07/2021 in [facility] Cardiac and Pulmonary Rehab  Date 04/20/21  Educator [facility]  Instruction Review Code 1- Verbalizes Understanding       Falls Prevention: - Provides verbal and written material to individual with discussion of falls prevention and safety. Flowsheet Row Cardiac Rehab from 06/07/2021 in [facility] Cardiac and Pulmonary Rehab  Date 04/20/21  Educator [facility]  Instruction Review Code 1- Verbalizes Understanding       Other: -Provides group and verbal instruction on various topics (see comments)   Knowledge Questionnaire Score:   Core Components/Risk Factors/Patient Goals at Admission:  Personal Goals and Risk Factors at Admission - 04/26/21 1650       Core Components/Risk Factors/Patient Goals on Admission    Weight Management Yes;Weight Loss    Intervention Weight Management: Develop a combined nutrition and exercise program designed to reach desired caloric intake, while maintaining appropriate intake of nutrient and fiber, sodium and fats, and appropriate energy expenditure required for the weight goal.;Weight Management: Provide education and appropriate resources to help participant work on and attain dietary goals.;Weight Management/Obesity: Establish reasonable short term and long term weight goals.    Expected Outcomes Long Term: Adherence to nutrition and physical activity/exercise program aimed toward attainment of established weight goal;Short Term: Continue to assess and modify interventions until short term weight is  achieved;Weight Loss: Understanding of general recommendations for a balanced deficit meal plan, which promotes 1-2 lb weight loss per week and includes a negative energy balance of [phone] kcal/d;Understanding recommendations for meals to include 15-35% energy as protein, 25-35% energy from fat, 35-60% energy from carbohydrates, less than $RemoveB'200mg'QtqIfgme$  of dietary cholesterol, 20-35 gm of total fiber daily;Understanding of distribution of calorie intake throughout the day with the consumption of 4-5 meals/snacks    Heart Failure Yes    Intervention Provide a combined exercise and nutrition program that is supplemented with education, support and counseling about heart failure. Directed toward relieving symptoms such as shortness of breath, decreased exercise tolerance, and extremity edema.    Expected Outcomes Improve functional capacity of life;Short term: Attendance in program 2-3 days a week with increased exercise capacity. Reported lower sodium intake. Reported increased fruit and vegetable intake. Reports medication compliance.;Short term: Daily weights obtained and reported for increase. Utilizing diuretic protocols set by physician.;Long term: Adoption of self-care skills and reduction of barriers for early signs and symptoms recognition and intervention leading to self-care maintenance.    Hypertension Yes    Intervention Provide education on lifestyle modifcations including regular physical activity/exercise, weight management, moderate sodium restriction and increased consumption of fresh fruit, vegetables, and low fat dairy, alcohol moderation, and smoking cessation.;Monitor prescription use compliance.    Expected Outcomes Short Term: Continued assessment and intervention until BP is < 140/69mm HG in hypertensive participants. < 130/30mm HG in hypertensive participants with diabetes, heart failure or chronic kidney disease.;Long Term:  Maintenance of blood pressure at goal levels.    Lipids Yes     Intervention Provide education and support for participant on nutrition & aerobic/resistive exercise along with prescribed medications to achieve LDL '70mg'$ , HDL >$Remo'40mg'VTYkD$ .    Expected Outcomes Short Term: Participant states understanding of desired cholesterol values and is compliant with medications prescribed. Participant is following exercise prescription and nutrition guidelines.;Long Term: Cholesterol controlled with medications as prescribed, with individualized exercise RX and with personalized nutrition plan. Value goals: LDL < $Rem'70mg'OVXM$ , HDL > 40 mg.             Education:Diabetes - Individual verbal and written instruction to review signs/symptoms of diabetes, desired ranges of glucose level fasting, after meals and with exercise. Acknowledge that pre and post exercise glucose checks will be done for 3 sessions at entry of program.   Core Components/Risk Factors/Patient Goals Review:   Goals and Risk Factor Review     Row Name 05/10/21 1105 06/01/21 1612           Core Components/Risk Factors/Patient Goals Review   Personal Goals Review Weight Management/Obesity;Hypertension;Lipids Weight Management/Obesity;Hypertension      Review Calogero reports taking all meds as directed.  He does check BP at home and it is normally around 120's.  He will meet with RD Friday - no weight loss yet. Sandy has been stable with his blood pressure. He checks his blood pressure at home and is usually in the 161-096 for systolic. His blood pressure readings have been good and within normal limits in rehab.  Nevin wants to lose weight and reach a weight goal of 190 pounds.      Expected Outcomes Short: continue to attend consistently and tale meds as directed Long:  manage risk factors long term Short: lose 5 pounds in the next couple weeks. Long: reach a weight goal of 190 pounds.               Core Components/Risk Factors/Patient Goals at Discharge (Final Review):   Goals and Risk Factor Review -  06/01/21 1612       Core Components/Risk Factors/Patient Goals Review   Personal Goals Review Weight Management/Obesity;Hypertension    Review Srihari has been stable with his blood pressure. He checks his blood pressure at home and is usually in the 045-409 for systolic. His blood pressure readings have been good and within normal limits in rehab.  Willet wants to lose weight and reach a weight goal of 190 pounds.    Expected Outcomes Short: lose 5 pounds in the next couple weeks. Long: reach a weight goal of 190 pounds.             ITP Comments:  ITP Comments     Row Name 04/20/21 1312 04/26/21 1657 05/01/21 1107 05/24/21 0729 05/29/21 1122   ITP Comments Virtual Visit completed. Patient informed on EP and RD appointment and 6 Minute walk test. Patient also informed of patient health questionnaires on My Chart. Patient Verbalizes understanding. Visit diagnosis can be found in [facility] 04/18/2021. Completed 6MWT and gym orientation. Initial ITP created and sent for review to Dr. Emily Filbert, Medical Director. First full day of exercise!  Patient was oriented to gym and equipment including functions, settings, policies, and procedures.  Patient's individual exercise prescription and treatment plan were reviewed.  All starting workloads were established based on the results of the 6 minute walk test done at initial orientation visit.  The plan for exercise progression was also introduced and progression  will be customized based on patient's performance and goals. 30 Day review completed. Medical Director ITP review done, changes made as directed, and signed approval by Medical Director. Completed initial RD consultation    [place] Name 06/21/21 0649           ITP Comments 30 Day review completed. Medical Director ITP review done, changes made as directed, and signed approval by Medical Director.                Comments:

## 2021-06-22 ENCOUNTER — Encounter: Payer: Medicare Other | Admitting: *Deleted

## 2021-06-22 ENCOUNTER — Other Ambulatory Visit: Payer: Self-pay

## 2021-06-22 DIAGNOSIS — Z48812 Encounter for surgical aftercare following surgery on the circulatory system: Secondary | ICD-10-CM | POA: Diagnosis not present

## 2021-06-22 DIAGNOSIS — Z951 Presence of aortocoronary bypass graft: Secondary | ICD-10-CM | POA: Diagnosis not present

## 2021-06-22 NOTE — Progress Notes (Signed)
Cardiac Individual Treatment Plan  Patient Details  Name: Edward [name] MRN: [id number] Date of Birth: 1956/09/19 Referring Provider:   Flowsheet Row Cardiac Rehab from 04/26/2021 in [facility] Cardiac and Pulmonary Rehab  Referring Provider [name]       Initial Encounter Date:  Flowsheet Row Cardiac Rehab from 04/26/2021 in [facility] Cardiac and Pulmonary Rehab  Date 04/26/21       Visit Diagnosis: S/P CABG x 3  Patient's Home Medications on Admission:  Current Outpatient Medications:    aspirin EC 325 MG EC tablet, Take 1 tablet (325 mg total) by mouth daily., Disp: 30 tablet, Rfl: 0   atorvastatin (LIPITOR) 80 MG tablet, Take 1 tablet (80 mg total) by mouth daily., Disp: 30 tablet, Rfl: 2   dapagliflozin propanediol (FARXIGA) 10 MG TABS tablet, Take 1 tablet (10 mg total) by mouth daily before breakfast., Disp: 30 tablet, Rfl: 3   digoxin (LANOXIN) 0.125 MG tablet, Take 1 tablet (0.125 mg total) by mouth daily., Disp: 30 tablet, Rfl: 2   furosemide (LASIX) 20 MG tablet, Take 1 tablet (20 mg total) by mouth as needed (FOR 3 LB WEIGHT GAIN)., Disp: 45 tablet, Rfl: 3   loratadine (CLARITIN) 10 MG tablet, Take 10 mg by mouth daily as needed for allergies., Disp: , Rfl:    metoprolol succinate (TOPROL-XL) 25 MG 24 hr tablet, Take 0.5 tablets (12.5 mg total) by mouth daily., Disp: 15 tablet, Rfl: 3   Multiple Vitamin (MULTIVITAMIN WITH MINERALS) TABS tablet, Take 1 tablet by mouth daily., Disp: , Rfl:    sacubitril-valsartan (ENTRESTO) 24-26 MG, Take 1 tablet by mouth 2 (two) times daily., Disp: 60 tablet, Rfl: 2   spironolactone (ALDACTONE) 25 MG tablet, Take 0.5 tablets (12.5 mg total) by mouth daily., Disp: 15 tablet, Rfl: 3  Past Medical History: Past Medical History:  Diagnosis Date   CAD (coronary artery disease)    a. STEMI 2004 with occluded Cx and 80% RCA s/p DESx2. b. CABG 03/2021.   Chronic systolic CHF (congestive heart failure) (HCC)    CKD (chronic kidney disease), stage III  (HCC)    HCV antibody positive    previous eval at [facility] hepatology clinic   Ischemic cardiomyopathy    STEMI (ST elevation myocardial infarction) ([place]) 2004   with totally occluded CFX (infarct related artery) and 80% mRCA stenosis. Taxus DES to both vessles. EF was 50-55% by LV-gram   Thrombocytopenia ([place])     Tobacco Use: Social History   Tobacco Use  Smoking Status Former   Packs/day: 1.00   Years: 10.00   Pack years: 10.00   Types: Cigarettes   Quit date: 12/11/2003   Years since quitting: 17.5  Smokeless Tobacco Never    Labs: Recent Review Flowsheet Data     Labs for ITP Cardiac and Pulmonary Rehab Latest Ref Rng & Units 03/29/2021 03/30/2021 03/31/2021 04/01/2021 05/22/2021   Cholestrol 100 - 199 mg/dL - - - - 99(L)   LDLCALC 0 - 99 mg/dL - - - - 50   HDL >39 mg/dL - - - - 34(L)   Trlycerides 0 - 149 mg/dL - - - - 72   Hemoglobin A1c 4.8 - 5.6 % - - - - -   PHART 7.350 - 7.450 - - - - -   PCO2ART 32.0 - 48.0 mmHg - - - - -   HCO3 20.0 - 28.0 mmol/L - - - - -   TCO2 22 - 32 mmol/L - - - - -  ACIDBASEDEF 0.0 - 2.0 mmol/L - - - - -   O2SAT % 62.4 63.7 59.1 56.7 -        Exercise Target Goals: Exercise Program Goal: Individual exercise prescription set using results from initial 6 min walk test and THRR while considering  patient's activity barriers and safety.   Exercise Prescription Goal: Initial exercise prescription builds to 30-45 minutes a day of aerobic activity, 2-3 days per week.  Home exercise guidelines will be given to patient during program as part of exercise prescription that the participant will acknowledge.   Education: Aerobic Exercise: - Group verbal and visual presentation on the components of exercise prescription. Introduces F.I.T.T principle from ACSM for exercise prescriptions.  Reviews F.I.T.T. principles of aerobic exercise including progression. Written material given at graduation. Flowsheet Row Cardiac Rehab from 06/07/2021 in [facility] Cardiac  and Pulmonary Rehab  Date 05/31/21  Educator [place]  Instruction Review Code 1- Verbalizes Understanding       Education: Resistance Exercise: - Group verbal and visual presentation on the components of exercise prescription. Introduces F.I.T.T principle from ACSM for exercise prescriptions  Reviews F.I.T.T. principles of resistance exercise including progression. Written material given at graduation. Flowsheet Row Cardiac Rehab from 06/07/2021 in [facility] Cardiac and Pulmonary Rehab  Date 06/07/21  Educator [place]  Instruction Review Code 1- Verbalizes Understanding        Education: Exercise & Equipment Safety: - Individual verbal instruction and demonstration of equipment use and safety with use of the equipment. Flowsheet Row Cardiac Rehab from 06/07/2021 in [facility] Cardiac and Pulmonary Rehab  Date 04/20/21  Educator [facility]  Instruction Review Code 1- Verbalizes Understanding       Education: Exercise Physiology & General Exercise Guidelines: - Group verbal and written instruction with models to review the exercise physiology of the cardiovascular system and associated critical values. Provides general exercise guidelines with specific guidelines to those with heart or lung disease.  Flowsheet Row Cardiac Rehab from 06/07/2021 in [facility] Cardiac and Pulmonary Rehab  Date 05/24/21  Educator AS  Instruction Review Code 1- Verbalizes Understanding       Education: Flexibility, Balance, Mind/Body Relaxation: - Group verbal and visual presentation with interactive activity on the components of exercise prescription. Introduces F.I.T.T principle from ACSM for exercise prescriptions. Reviews F.I.T.T. principles of flexibility and balance exercise training including progression. Also discusses the mind body connection.  Reviews various relaxation techniques to help reduce and manage stress (i.e. Deep breathing, progressive muscle relaxation, and visualization). Balance handout provided to take home.  Written material given at graduation.   Activity Barriers & Risk Stratification:   6 Minute Walk:  [place] Name 04/26/21 1506 06/19/21 1624       6 Minute Walk   Phase Initial Discharge    Distance 1405 feet 1750 feet    Distance % Change -- 25 %    Distance Feet Change -- 345 ft    Walk Time 6 minutes --    # of Rest Breaks 0 0    MPH 2.66 3.31    METS 3.5 3.98    RPE 7 12    Perceived Dyspnea  0 0    VO2 Peak 12.2 13.94    Symptoms No No    Resting HR 91 bpm 81 bpm    Resting BP 106/60 110/62    Resting Oxygen Saturation  99 % 95 %    Exercise Oxygen Saturation  during 6 min walk 99 %  90 %    Max Ex. HR 121 bpm 108 bpm    Max Ex. BP 132/76 142/70    2 Minute Post BP 118/66 --             Oxygen Initial Assessment:   Oxygen Re-Evaluation:   Oxygen Discharge (Final Oxygen Re-Evaluation):   Initial Exercise Prescription:  Initial Exercise Prescription - 04/26/21 1500       Date of Initial Exercise RX and Referring Provider   Date 04/26/21    Referring Provider [name]      Treadmill   MPH 2.6    Grade 1    Minutes 15    METs 3.5      NuStep   Level 3    SPM 80    Minutes 15    METs 3.5      Recumbant Elliptical   Level 2    RPM 50    Minutes 15    METs 3.5      REL-XR   Level 3    Speed 50    Minutes 15    METs 3.5      Intensity   THRR 40-80% of Max Heartrate 116-142    Ratings of Perceived Exertion 11-13    Perceived Dyspnea 0-4      Resistance Training   Training Prescription Yes    Weight 3 lb    Reps 10-15             Perform Capillary Blood Glucose checks as needed.  Exercise Prescription Changes:   Exercise Prescription Changes     Row Name 04/26/21 1500 05/15/21 0900 05/24/21 1700 05/30/21 1400 06/15/21 0700     Response to Exercise   Blood Pressure (Admit) 106/60 132/80 -- 110/70 112/60   Blood Pressure (Exercise) 132/76 142/70 -- 130/60 122/74   Blood Pressure (Exit) 118/66 102/62 --  108/68 114/70   Heart Rate (Admit) 91 bpm 78 bpm -- 81 bpm 79 bpm   Heart Rate (Exercise) 121 bpm 113 bpm -- 138 bpm 126 bpm   Heart Rate (Exit) 79 bpm 79 bpm -- 98 bpm 77 bpm   Oxygen Saturation (Admit) 99 % -- -- -- --   Oxygen Saturation (Exercise) 99 % -- -- -- --   Rating of Perceived Exertion (Exercise) 7 2.8 -- 13 13   Perceived Dyspnea (Exercise) 0 -- -- -- --   Symptoms none none -- none none   Duration -- Continue with 30 min of aerobic exercise without signs/symptoms of physical distress. -- Continue with 30 min of aerobic exercise without signs/symptoms of physical distress. Continue with 30 min of aerobic exercise without signs/symptoms of physical distress.   Intensity -- THRR unchanged -- THRR unchanged THRR unchanged     Progression   Progression -- Continue to progress workloads to maintain intensity without signs/symptoms of physical distress. -- Continue to progress workloads to maintain intensity without signs/symptoms of physical distress. Continue to progress workloads to maintain intensity without signs/symptoms of physical distress.   Average METs -- 2.8 -- 3.75 4.57     Resistance Training   Training Prescription -- Yes -- Yes Yes   Weight -- 3 lb -- 5 lb 5 lb   Reps -- 10-15 -- 10-15 10-15     Interval Training   Interval Training -- -- -- No Yes   Equipment -- -- -- -- Treadmill   Comments -- -- -- -- 3.6 speed, with 5.2 speed     Treadmill  MPH -- 3 -- 3.6 3.7   Grade -- 1 -- 1 2.5   Minutes -- 15 -- 15 15   METs -- 3.71 -- 4.25 5.11     Recumbant Elliptical   Level -- -- -- 3 3   Minutes -- -- -- 15 15   METs -- -- -- 2.5 2.5     REL-XR   Level -- -- -- 5 7   Speed -- -- -- -- 60   Minutes -- -- -- 15 15   METs -- -- -- 4.5 4.2     T5 Nustep   Level -- 1 -- -- --   Minutes -- 15 -- -- --   METs -- 1.9 -- -- --     Home Exercise Plan   Plans to continue exercise at -- -- Home (comment)  Treadmill and bike Home (comment)  Treadmill and bike  Home (comment)  Treadmill and bike   Frequency -- -- Add 2 additional days to program exercise sessions.  Start with 1 day Add 2 additional days to program exercise sessions.  Start with 1 day Add 2 additional days to program exercise sessions.  Start with 1 day   Initial Home Exercises Provided -- -- 05/24/21 05/24/21 05/24/21            Exercise Comments:   Exercise Goals and Review:   Exercise Goals     Row Name 04/26/21 1511             Exercise Goals   Increase Physical Activity Yes       Intervention Provide advice, education, support and counseling about physical activity/exercise needs.;Develop an individualized exercise prescription for aerobic and resistive training based on initial evaluation findings, risk stratification, comorbidities and participant's personal goals.       Expected Outcomes Short Term: Attend rehab on a regular basis to increase amount of physical activity.;Long Term: Add in home exercise to make exercise part of routine and to increase amount of physical activity.;Long Term: Exercising regularly at least 3-5 days a week.       Increase Strength and Stamina Yes       Intervention Provide advice, education, support and counseling about physical activity/exercise needs.;Develop an individualized exercise prescription for aerobic and resistive training based on initial evaluation findings, risk stratification, comorbidities and participant's personal goals.       Expected Outcomes Short Term: Increase workloads from initial exercise prescription for resistance, speed, and METs.;Short Term: Perform resistance training exercises routinely during rehab and add in resistance training at home;Long Term: Improve cardiorespiratory fitness, muscular endurance and strength as measured by increased METs and functional capacity (6MWT)       Able to understand and use rate of perceived exertion (RPE) scale Yes       Intervention Provide education and explanation on how  to use RPE scale       Expected Outcomes Short Term: Able to use RPE daily in rehab to express subjective intensity level;Long Term:  Able to use RPE to guide intensity level when exercising independently       Able to understand and use Dyspnea scale Yes       Intervention Provide education and explanation on how to use Dyspnea scale       Expected Outcomes Short Term: Able to use Dyspnea scale daily in rehab to express subjective sense of shortness of breath during exertion;Long Term: Able to use Dyspnea scale to guide intensity level when exercising independently  Knowledge and understanding of Target Heart Rate Range (THRR) Yes       Intervention Provide education and explanation of THRR including how the numbers were predicted and where they are located for reference       Expected Outcomes Short Term: Able to state/look up THRR;Short Term: Able to use daily as guideline for intensity in rehab;Long Term: Able to use THRR to govern intensity when exercising independently       Able to check pulse independently Yes       Intervention Provide education and demonstration on how to check pulse in carotid and radial arteries.;Review the importance of being able to check your own pulse for safety during independent exercise       Expected Outcomes Short Term: Able to explain why pulse checking is important during independent exercise;Long Term: Able to check pulse independently and accurately       Understanding of Exercise Prescription Yes       Intervention Provide education, explanation, and written materials on patient's individual exercise prescription       Expected Outcomes Short Term: Able to explain program exercise prescription;Long Term: Able to explain home exercise prescription to exercise independently                Exercise Goals Re-Evaluation :  Exercise Goals Re-Evaluation     Row Name 05/01/21 1108 05/10/21 1109 05/15/21 1001 05/24/21 1701 05/30/21 1408     Exercise  Goal Re-Evaluation   Exercise Goals Review Able to understand and use rate of perceived exertion (RPE) scale;Knowledge and understanding of Target Heart Rate Range (THRR);Understanding of Exercise Prescription;Increase Strength and Stamina;Able to check pulse independently;Improve claudication pain tolerance and improve walking ability;Increase Physical Activity Increase Physical Activity;Increase Strength and Stamina Increase Physical Activity;Increase Strength and Stamina Increase Physical Activity;Increase Strength and Stamina Increase Physical Activity;Increase Strength and Stamina;Understanding of Exercise Prescription   Comments Reviewed RPE and dyspnea scales, THR and program prescription with pt today.  Pt voiced understanding and was given a copy of goals to take home. Edward [name] says he can definfitle tell he is working when exercising in class.  He wants to go back to the gym in a couple weeks - staff will review home exercise. Edward [name] has increased TM to 3 mph and 1%.  Staff will revew home exercise and monitor progress. Reviewed home exercise with pt today.  Pt plans to use his treadmill and bike at home for exercise.  Reviewed THR, pulse, RPE, sign and symptoms, pulse oximetery and when to call 911 or MD.  Also discussed weather considerations and indoor options.  Pt voiced understanding. Edward [name] is doing well in rehab.  He is up to level 5 on the XR.  We will continue to monitor his progress.   Expected Outcomes Short: Use RPE daily to regulate intensity. Long: Follow program prescription in THR. Short: attend consistently Long:  build stamina back Short: review home ex with staff Long: continue to build stamina Short: Add on 1 day of exercise at home Long: Exercise at home independently at appropriate exercise prescription Short: Get incline back up on treadmill Long: Continue to improve stamina.    Edward [name] Name 06/15/21 0749             Exercise Goal Re-Evaluation   Comments Edward [name] is progressing  well. He started doing intervals on the TM, ranging around 3.1 speed to 5.5 speed with a 2% incline. XR is now up to level 7. Will continue to monitor progress  Expected Outcomes Short: Start intervals with XR Long: Continue to increase overall MET level                Discharge Exercise Prescription (Final Exercise Prescription Changes):  Exercise Prescription Changes - 06/15/21 0700       Response to Exercise   Blood Pressure (Admit) 112/60    Blood Pressure (Exercise) 122/74    Blood Pressure (Exit) 114/70    Heart Rate (Admit) 79 bpm    Heart Rate (Exercise) 126 bpm    Heart Rate (Exit) 77 bpm    Rating of Perceived Exertion (Exercise) 13    Symptoms none    Duration Continue with 30 min of aerobic exercise without signs/symptoms of physical distress.    Intensity THRR unchanged      Progression   Progression Continue to progress workloads to maintain intensity without signs/symptoms of physical distress.    Average METs 4.57      Resistance Training   Training Prescription Yes    Weight 5 lb    Reps 10-15      Interval Training   Interval Training Yes    Equipment Treadmill    Comments 3.6 speed, with 5.2 speed      Treadmill   MPH 3.7    Grade 2.5    Minutes 15    METs 5.11      Recumbant Elliptical   Level 3    Minutes 15    METs 2.5      REL-XR   Level 7    Speed 60    Minutes 15    METs 4.2      Home Exercise Plan   Plans to continue exercise at Home (comment)   Treadmill and bike   Frequency Add 2 additional days to program exercise sessions.   Start with 1 day   Initial Home Exercises Provided 05/24/21             Nutrition:  Target Goals: Understanding of nutrition guidelines, daily intake of sodium <1579m, cholesterol <2018m calories 30% from fat and 7% or less from saturated fats, daily to have 5 or more servings of fruits and vegetables.  Education: All About Nutrition: -Group instruction provided by verbal, written  material, interactive activities, discussions, models, and posters to present general guidelines for heart healthy nutrition including fat, fiber, MyPlate, the role of sodium in heart healthy nutrition, utilization of the nutrition label, and utilization of this knowledge for meal planning. Follow up email sent as well. Written material given at graduation.   Biometrics:  Pre Biometrics - 04/26/21 1513       Pre Biometrics   Height 5' 10" (1.778 m)    Weight 205 lb 9.6 oz (93.3 kg)    BMI (Calculated) 29.5    Single Leg Stand 28.05 seconds             Post Biometrics - 06/19/21 1627        Post  Biometrics   Height 5' 10" (1.778 m)    Weight 212 lb 1.6 oz (96.2 kg)    BMI (Calculated) 30.43    Single Leg Stand 30 seconds             Nutrition Therapy Plan and Nutrition Goals:  Nutrition Therapy & Goals - 05/29/21 1047       Nutrition Therapy   Diet Heart healthy, low Na    Protein (specify units) 75g    Fiber 30 grams  Whole Grain Foods 3 servings    Saturated Fats 12 max. grams    Fruits and Vegetables 8 servings/day    Sodium 1.5 grams      Personal Nutrition Goals   Nutrition Goal ST: Add 1 carbohydrate rich food, preferably complex carbohydrate, with at least 1 meal per day LT: Manage hunger during the day    Comments B: oatmeal and blueberries and honey (most time tries to avoid breakfast, but he gets hungry) L: chicken breast, spinach and broccoli 3x/day and then he will eat out the rest of the time. D: chicken breast or tuna and vegetables like lunch. He will cook with coconut oil and butter, when he has a salad he will have olive oil. He will put chicken wings in the airfrier. He will eat hamburger and steak every now and then. He will sometimes eat corn and beans. He will eat 9 to 10pm - second dinner. He feels very full when he finishes a meal, but will get hungry 2-3 hours after. Discussed how to make meals more satiating and discussed hunger cues.  Discussed heart healhty eaitng.      Intervention Plan   Intervention Prescribe, educate and counsel regarding individualized specific dietary modifications aiming towards targeted core components such as weight, hypertension, lipid management, diabetes, heart failure and other comorbidities.;Nutrition handout(s) given to patient.    Expected Outcomes Short Term Goal: Understand basic principles of dietary content, such as calories, fat, sodium, cholesterol and nutrients.;Short Term Goal: A plan has been developed with personal nutrition goals set during dietitian appointment.;Long Term Goal: Adherence to prescribed nutrition plan.             Nutrition Assessments:  MEDIFICTS Score Key: ?70 Need to make dietary changes  40-70 Heart Healthy Diet ? 40 Therapeutic Level Cholesterol Diet  Flowsheet Row Cardiac Rehab from 06/21/2021 in [facility] Cardiac and Pulmonary Rehab  Picture Your Plate Total Score on Discharge 55      Picture Your Plate Scores: <12 Unhealthy dietary pattern with much room for improvement. 41-50 Dietary pattern unlikely to meet recommendations for good health and room for improvement. 51-60 More healthful dietary pattern, with some room for improvement.  >60 Healthy dietary pattern, although there may be some specific behaviors that could be improved.    Nutrition Goals Re-Evaluation:   Nutrition Goals Discharge (Final Nutrition Goals Re-Evaluation):   Psychosocial: Target Goals: Acknowledge presence or absence of significant depression and/or stress, maximize coping skills, provide positive support system. Participant is able to verbalize types and ability to use techniques and skills needed for reducing stress and depression.   Education: Stress, Anxiety, and Depression - Group verbal and visual presentation to define topics covered.  Reviews how body is impacted by stress, anxiety, and depression.  Also discusses healthy ways to reduce stress and to  treat/manage anxiety and depression.  Written material given at graduation.   Education: Sleep Hygiene -Provides group verbal and written instruction about how sleep can affect your health.  Define sleep hygiene, discuss sleep cycles and impact of sleep habits. Review good sleep hygiene tips.    Initial Review & Psychosocial Screening:  Initial Psych Review & Screening - 04/20/21 1314       Initial Review   Current issues with None Identified      Family Dynamics   Good Support System? Yes    Comments He has a good family support system. He has a wife and three children he can call if he needs support.  Darric has a positive outlook on his health.      Barriers   Psychosocial barriers to participate in program The patient should benefit from training in stress management and relaxation.;There are no identifiable barriers or psychosocial needs.      Screening Interventions   Interventions Encouraged to exercise;To provide support and resources with identified psychosocial needs;Provide feedback about the scores to participant    Expected Outcomes Short Term goal: Utilizing psychosocial counselor, staff and physician to assist with identification of specific Stressors or current issues interfering with healing process. Setting desired goal for each stressor or current issue identified.;Long Term Goal: Stressors or current issues are controlled or eliminated.;Short Term goal: Identification and review with participant of any Quality of Life or Depression concerns found by scoring the questionnaire.;Long Term goal: The participant improves quality of Life and PHQ9 Scores as seen by post scores and/or verbalization of changes             Quality of Life Scores:   Quality of Life - 06/21/21 1651       Quality of Life   Select Quality of Life      Quality of Life Scores   Health/Function Pre 25.6 %    Health/Function Post 25.83 %    Health/Function % Change 0.9 %    Socioeconomic  Pre 25.83 %    Socioeconomic Post 23.21 %    Socioeconomic % Change  -10.14 %    Psych/Spiritual Pre 30 %    Psych/Spiritual Post 24.87 %    Psych/Spiritual % Change -17.1 %    Family Pre 24.5 %    Family Post 25.5 %    Family % Change 4.08 %    GLOBAL Pre 26.41 %    GLOBAL Post 24.88 %    GLOBAL % Change -5.79 %            Scores of 19 and below usually indicate a poorer quality of life in these areas.  A difference of  2-3 points is a clinically meaningful difference.  A difference of 2-3 points in the total score of the Quality of Life Index has been associated with significant improvement in overall quality of life, self-image, physical symptoms, and general health in studies assessing change in quality of life.  PHQ-9: Recent Review Flowsheet Data     Depression screen [facility] 2/9 06/21/2021 04/26/2021 02/03/2019 11/15/2017   Decreased Interest 2 2 0 0   Down, Depressed, Hopeless 0 0 0 0   PHQ - 2 Score 2 2 0 0   Altered sleeping 2 0 - -   Tired, decreased energy 2 0 - -   Change in appetite 2 1 - -   Feeling bad or failure about yourself  0 0 - -   Trouble concentrating 0 0 - -   Moving slowly or fidgety/restless 2 1 - -   Suicidal thoughts 0 0 - -   PHQ-9 Score 10 4 - -   Difficult doing work/chores Not difficult at all - - -      Interpretation of Total Score  Total Score Depression Severity:  1-4 = Minimal depression, 5-9 = Mild depression, 10-14 = Moderate depression, 15-19 = Moderately severe depression, 20-27 = Severe depression   Psychosocial Evaluation and Intervention:  Psychosocial Evaluation - 04/20/21 1315       Psychosocial Evaluation & Interventions   Interventions Encouraged to exercise with the program and follow exercise prescription;Relaxation education;Stress management education  Comments He has a good family support system. He has a wife and three children he can call if he needs support. Rawn has a positive outlook on his health.    Expected  Outcomes Short: Exercise regularly to support mental health and notify staff of any changes. Long: maintain mental health and well being through teaching of rehab or prescribed medications independently.    Continue Psychosocial Services  Follow up required by staff             Psychosocial Re-Evaluation:  Psychosocial Re-Evaluation     Edward [name] Name 05/10/21 1108 06/01/21 1621           Psychosocial Re-Evaluation   Current issues with None Identified None Identified      Comments Edward [name] states he has no stress concerns and sleeps well. Patient reports no issues with their current mental states, sleep, stress, depression or anxiety. Will follow up with patient in a few weeks for any changes.      Expected Outcomes Short: notify staff/MD if any changes Long: maintain positive outlook Short: Continue to exercise regularly to support mental health and notify staff of any changes. Long: maintain mental health and well being through teaching of rehab or prescribed medications independently.      Interventions -- Encouraged to attend Cardiac Rehabilitation for the exercise      Continue Psychosocial Services  -- Follow up required by staff               Psychosocial Discharge (Final Psychosocial Re-Evaluation):  Psychosocial Re-Evaluation - 06/01/21 1621       Psychosocial Re-Evaluation   Current issues with None Identified    Comments Patient reports no issues with their current mental states, sleep, stress, depression or anxiety. Will follow up with patient in a few weeks for any changes.    Expected Outcomes Short: Continue to exercise regularly to support mental health and notify staff of any changes. Long: maintain mental health and well being through teaching of rehab or prescribed medications independently.    Interventions Encouraged to attend Cardiac Rehabilitation for the exercise    Continue Psychosocial Services  Follow up required by staff             Vocational  Rehabilitation: Provide vocational rehab assistance to qualifying candidates.   Vocational Rehab Evaluation & Intervention:   Education: Education Goals: Education classes will be provided on a variety of topics geared toward better understanding of heart health and risk factor modification. Participant will state understanding/return demonstration of topics presented as noted by education test scores.  Learning Barriers/Preferences:  Learning Barriers/Preferences - 04/20/21 1309       Learning Barriers/Preferences   Learning Barriers None    Learning Preferences None             General Cardiac Education Topics:  AED/CPR: - Group verbal and written instruction with the use of models to demonstrate the basic use of the AED with the basic ABC's of resuscitation.   Anatomy and Cardiac Procedures: - Group verbal and visual presentation and models provide information about basic cardiac anatomy and function. Reviews the testing methods done to diagnose heart disease and the outcomes of the test results. Describes the treatment choices: Medical Management, Angioplasty, or Coronary Bypass Surgery for treating various heart conditions including Myocardial Infarction, Angina, Valve Disease, and Cardiac Arrhythmias.  Written material given at graduation. Flowsheet Row Cardiac Rehab from 06/07/2021 in [facility] Cardiac and Pulmonary Rehab  Date 06/07/21  Educator [facility]  Instruction Review Code 1- Verbalizes Understanding       Medication Safety: - Group verbal and visual instruction to review commonly prescribed medications for heart and lung disease. Reviews the medication, class of the drug, and side effects. Includes the steps to properly store meds and maintain the prescription regimen.  Written material given at graduation.   Intimacy: - Group verbal instruction through game format to discuss how heart and lung disease can affect sexual intimacy. Written material given at  graduation.. Flowsheet Row Cardiac Rehab from 06/07/2021 in [facility] Cardiac and Pulmonary Rehab  Date 05/31/21  Educator [facility]  Instruction Review Code 1- Verbalizes Understanding       Know Your Numbers and Heart Failure: - Group verbal and visual instruction to discuss disease risk factors for cardiac and pulmonary disease and treatment options.  Reviews associated critical values for Overweight/Obesity, Hypertension, Cholesterol, and Diabetes.  Discusses basics of heart failure: signs/symptoms and treatments.  Introduces Heart Failure Zone chart for action plan for heart failure.  Written material given at graduation.   Infection Prevention: - Provides verbal and written material to individual with discussion of infection control including proper hand washing and proper equipment cleaning during exercise session. Flowsheet Row Cardiac Rehab from 06/07/2021 in [facility] Cardiac and Pulmonary Rehab  Date 04/20/21  Educator [facility]  Instruction Review Code 1- Verbalizes Understanding       Falls Prevention: - Provides verbal and written material to individual with discussion of falls prevention and safety. Flowsheet Row Cardiac Rehab from 06/07/2021 in [facility] Cardiac and Pulmonary Rehab  Date 04/20/21  Educator [facility]  Instruction Review Code 1- Verbalizes Understanding       Other: -Provides group and verbal instruction on various topics (see comments)   Knowledge Questionnaire Score:  Knowledge Questionnaire Score - 06/21/21 1651       Knowledge Questionnaire Score   Post Score 23/26             Core Components/Risk Factors/Patient Goals at Admission:  Personal Goals and Risk Factors at Admission - 04/26/21 1650       Core Components/Risk Factors/Patient Goals on Admission    Weight Management Yes;Weight Loss    Intervention Weight Management: Develop a combined nutrition and exercise program designed to reach desired caloric intake, while maintaining appropriate intake of nutrient  and fiber, sodium and fats, and appropriate energy expenditure required for the weight goal.;Weight Management: Provide education and appropriate resources to help participant work on and attain dietary goals.;Weight Management/Obesity: Establish reasonable short term and long term weight goals.    Expected Outcomes Long Term: Adherence to nutrition and physical activity/exercise program aimed toward attainment of established weight goal;Short Term: Continue to assess and modify interventions until short term weight is achieved;Weight Loss: Understanding of general recommendations for a balanced deficit meal plan, which promotes 1-2 lb weight loss per week and includes a negative energy balance of [phone] kcal/d;Understanding recommendations for meals to include 15-35% energy as protein, 25-35% energy from fat, 35-60% energy from carbohydrates, less than 272m of dietary cholesterol, 20-35 gm of total fiber daily;Understanding of distribution of calorie intake throughout the day with the consumption of 4-5 meals/snacks    Heart Failure Yes    Intervention Provide a combined exercise and nutrition program that is supplemented with education, support and counseling about heart failure. Directed toward relieving symptoms such as shortness of breath, decreased exercise tolerance, and extremity edema.    Expected Outcomes Improve functional capacity of life;Short term: Attendance in program 2-3 days  a week with increased exercise capacity. Reported lower sodium intake. Reported increased fruit and vegetable intake. Reports medication compliance.;Short term: Daily weights obtained and reported for increase. Utilizing diuretic protocols set by physician.;Long term: Adoption of self-care skills and reduction of barriers for early signs and symptoms recognition and intervention leading to self-care maintenance.    Hypertension Yes    Intervention Provide education on lifestyle modifcations including regular physical  activity/exercise, weight management, moderate sodium restriction and increased consumption of fresh fruit, vegetables, and low fat dairy, alcohol moderation, and smoking cessation.;Monitor prescription use compliance.    Expected Outcomes Short Term: Continued assessment and intervention until BP is < 140/55m HG in hypertensive participants. < 130/817mHG in hypertensive participants with diabetes, heart failure or chronic kidney disease.;Long Term: Maintenance of blood pressure at goal levels.    Lipids Yes    Intervention Provide education and support for participant on nutrition & aerobic/resistive exercise along with prescribed medications to achieve LDL <7026mHDL >46m75m  Expected Outcomes Short Term: Participant states understanding of desired cholesterol values and is compliant with medications prescribed. Participant is following exercise prescription and nutrition guidelines.;Long Term: Cholesterol controlled with medications as prescribed, with individualized exercise RX and with personalized nutrition plan. Value goals: LDL < 70mg58mL > 40 mg.             Education:Diabetes - Individual verbal and written instruction to review signs/symptoms of diabetes, desired ranges of glucose level fasting, after meals and with exercise. Acknowledge that pre and post exercise glucose checks will be done for 3 sessions at entry of program.   Core Components/Risk Factors/Patient Goals Review:   Goals and Risk Factor Review     Row Name 05/10/21 1105 06/01/21 1612           Core Components/Risk Factors/Patient Goals Review   Personal Goals Review Weight Management/Obesity;Hypertension;Lipids Weight Management/Obesity;Hypertension      Review RoberRodgersrts taking all meds as directed.  He does check BP at home and it is normally around 120's.  He will meet with RD Friday - no weight loss yet. Edward [name] stable with his blood pressure. He checks his blood pressure at home and is usually  in the [id number]. His blood pressure readings have been good and within normal limits in rehab.  Edward [name] to lose weight and reach a weight goal of 190 pounds.      Expected Outcomes Short: continue to attend consistently and tale meds as directed Long:  manage risk factors long term Short: lose 5 pounds in the next couple weeks. Long: reach a weight goal of 190 pounds.               Core Components/Risk Factors/Patient Goals at Discharge (Final Review):   Goals and Risk Factor Review - 06/01/21 1612       Core Components/Risk Factors/Patient Goals Review   Personal Goals Review Weight Management/Obesity;Hypertension    Review Edward [name] stable with his blood pressure. He checks his blood pressure at home and is usually in the [id number]. His blood pressure readings have been good and within normal limits in rehab.  Edward [name] to lose weight and reach a weight goal of 190 pounds.    Expected Outcomes Short: lose 5 pounds in the next couple weeks. Long: reach a weight goal of 190 pounds.             ITP Comments:  ITP Comments  Edward [name] Name 04/20/21 1312 04/26/21 1657 05/01/21 1107 05/24/21 0729 05/29/21 1122   ITP Comments Virtual Visit completed. Patient informed on EP and RD appointment and 6 Minute walk test. Patient also informed of patient health questionnaires on My Chart. Patient Verbalizes understanding. Visit diagnosis can be found in [facility] 04/18/2021. Completed 6MWT and gym orientation. Initial ITP created and sent for review to Dr. Emily Filbert, Medical Director. First full day of exercise!  Patient was oriented to gym and equipment including functions, settings, policies, and procedures.  Patient's individual exercise prescription and treatment plan were reviewed.  All starting workloads were established based on the results of the 6 minute walk test done at initial orientation visit.  The plan for exercise progression was also introduced and  progression will be customized based on patient's performance and goals. 30 Day review completed. Medical Director ITP review done, changes made as directed, and signed approval by Medical Director. Completed initial RD consultation    [place] Name 06/21/21 0649 06/22/21 1604         ITP Comments 30 Day review completed. Medical Director ITP review done, changes made as directed, and signed approval by Medical Director. Edward [name] graduated today from  rehab with 28 sessions completed.  Details of the patient's exercise prescription and what He needs to do in order to continue the prescription and progress were discussed with patient.  Patient was given a copy of prescription and goals.  Patient verbalized understanding.  Edward [name] plans to continue to exercise by using his home treadmill and bike.               Comments: Discharge ITP

## 2021-06-22 NOTE — Progress Notes (Signed)
Discharge Progress Report  Patient Details  Name: Edward [name] MRN: [id number] Date of Birth: 10/25/1956 Referring Provider:   Flowsheet Row Cardiac Rehab from 04/26/2021 in [facility] Cardiac and Pulmonary Rehab  Referring Provider [name]        Number of Visits: 28  Reason for Discharge:  Patient reached a stable level of exercise. Patient independent in their exercise. Patient has met program and personal goals.  Smoking History:  Social History   Tobacco Use  Smoking Status Former   Packs/day: 1.00   Years: 10.00   Pack years: 10.00   Types: Cigarettes   Quit date: 12/11/2003   Years since quitting: 17.5  Smokeless Tobacco Never    Diagnosis:  S/P CABG x 3  ADL UCSD:   Initial Exercise Prescription:  Initial Exercise Prescription - 04/26/21 1500       Date of Initial Exercise RX and Referring Provider   Date 04/26/21    Referring Provider [name]      Treadmill   MPH 2.6    Grade 1    Minutes 15    METs 3.5      NuStep   Level 3    SPM 80    Minutes 15    METs 3.5      Recumbant Elliptical   Level 2    RPM 50    Minutes 15    METs 3.5      REL-XR   Level 3    Speed 50    Minutes 15    METs 3.5      Intensity   THRR 40-80% of Max Heartrate 116-142    Ratings of Perceived Exertion 11-13    Perceived Dyspnea 0-4      Resistance Training   Training Prescription Yes    Weight 3 lb    Reps 10-15             Discharge Exercise Prescription (Final Exercise Prescription Changes):  Exercise Prescription Changes - 06/15/21 0700       Response to Exercise   Blood Pressure (Admit) 112/60    Blood Pressure (Exercise) 122/74    Blood Pressure (Exit) 114/70    Heart Rate (Admit) 79 bpm    Heart Rate (Exercise) 126 bpm    Heart Rate (Exit) 77 bpm    Rating of Perceived Exertion (Exercise) 13    Symptoms none    Duration Continue with 30 min of aerobic exercise without signs/symptoms of physical distress.    Intensity THRR unchanged       Progression   Progression Continue to progress workloads to maintain intensity without signs/symptoms of physical distress.    Average METs 4.57      Resistance Training   Training Prescription Yes    Weight 5 lb    Reps 10-15      Interval Training   Interval Training Yes    Equipment Treadmill    Comments 3.6 speed, with 5.2 speed      Treadmill   MPH 3.7    Grade 2.5    Minutes 15    METs 5.11      Recumbant Elliptical   Level 3    Minutes 15    METs 2.5      REL-XR   Level 7    Speed 60    Minutes 15    METs 4.2      Home Exercise Plan   Plans to continue exercise at Home (comment)  Treadmill and bike   Frequency Add 2 additional days to program exercise sessions.   Start with 1 day   Initial Home Exercises Provided 05/24/21             Functional Capacity:  6 Minute Walk     Row Name 04/26/21 1506 06/19/21 1624       6 Minute Walk   Phase Initial Discharge    Distance 1405 feet 1750 feet    Distance % Change -- 25 %    Distance Feet Change -- 345 ft    Walk Time 6 minutes --    # of Rest Breaks 0 0    MPH 2.66 3.31    METS 3.5 3.98    RPE 7 12    Perceived Dyspnea  0 0    VO2 Peak 12.2 13.94    Symptoms No No    Resting HR 91 bpm 81 bpm    Resting BP 106/60 110/62    Resting Oxygen Saturation  99 % 95 %    Exercise Oxygen Saturation  during 6 min walk 99 % 90 %    Max Ex. HR 121 bpm 108 bpm    Max Ex. BP 132/76 142/70    2 Minute Post BP 118/66 --             Psychological, QOL, Others - Outcomes: PHQ 2/9: Depression screen [facility] 2/9 06/21/2021 04/26/2021 02/03/2019 11/15/2017  Decreased Interest 2 2 0 0  Down, Depressed, Hopeless 0 0 0 0  PHQ - 2 Score 2 2 0 0  Altered sleeping 2 0 - -  Tired, decreased energy 2 0 - -  Change in appetite 2 1 - -  Feeling bad or failure about yourself  0 0 - -  Trouble concentrating 0 0 - -  Moving slowly or fidgety/restless 2 1 - -  Suicidal thoughts 0 0 - -  PHQ-9 Score 10 4 - -   Difficult doing work/chores Not difficult at all - - -    Quality of Life:  Quality of Life - 06/21/21 1651       Quality of Life   Select Quality of Life      Quality of Life Scores   Health/Function Pre 25.6 %    Health/Function Post 25.83 %    Health/Function % Change 0.9 %    Socioeconomic Pre 25.83 %    Socioeconomic Post 23.21 %    Socioeconomic % Change  -10.14 %    Psych/Spiritual Pre 30 %    Psych/Spiritual Post 24.87 %    Psych/Spiritual % Change -17.1 %    Family Pre 24.5 %    Family Post 25.5 %    Family % Change 4.08 %    GLOBAL Pre 26.41 %    GLOBAL Post 24.88 %    GLOBAL % Change -5.79 %              Nutrition & Weight - Outcomes:  Pre Biometrics - 04/26/21 1513       Pre Biometrics   Height 5' 10" (1.778 m)    Weight 205 lb 9.6 oz (93.3 kg)    BMI (Calculated) 29.5    Single Leg Stand 28.05 seconds             Post Biometrics - 06/19/21 1627        Post  Biometrics   Height 5' 10" (1.778 m)    Weight 212 lb 1.6 oz (  96.2 kg)    BMI (Calculated) 30.43    Single Leg Stand 30 seconds             Nutrition:  Nutrition Therapy & Goals - 05/29/21 1047       Nutrition Therapy   Diet Heart healthy, low Na    Protein (specify units) 75g    Fiber 30 grams    Whole Grain Foods 3 servings    Saturated Fats 12 max. grams    Fruits and Vegetables 8 servings/day    Sodium 1.5 grams      Personal Nutrition Goals   Nutrition Goal ST: Add 1 carbohydrate rich food, preferably complex carbohydrate, with at least 1 meal per day LT: Manage hunger during the day    Comments B: oatmeal and blueberries and honey (most time tries to avoid breakfast, but he gets hungry) L: chicken breast, spinach and broccoli 3x/day and then he will eat out the rest of the time. D: chicken breast or tuna and vegetables like lunch. He will cook with coconut oil and butter, when he has a salad he will have olive oil. He will put chicken wings in the airfrier. He will  eat hamburger and steak every now and then. He will sometimes eat corn and beans. He will eat 9 to 10pm - second dinner. He feels very full when he finishes a meal, but will get hungry 2-3 hours after. Discussed how to make meals more satiating and discussed hunger cues. Discussed heart healhty eaitng.      Intervention Plan   Intervention Prescribe, educate and counsel regarding individualized specific dietary modifications aiming towards targeted core components such as weight, hypertension, lipid management, diabetes, heart failure and other comorbidities.;Nutrition handout(s) given to patient.    Expected Outcomes Short Term Goal: Understand basic principles of dietary content, such as calories, fat, sodium, cholesterol and nutrients.;Short Term Goal: A plan has been developed with personal nutrition goals set during dietitian appointment.;Long Term Goal: Adherence to prescribed nutrition plan.             Education Questionnaire Score:  Knowledge Questionnaire Score - 06/21/21 1651       Knowledge Questionnaire Score   Post Score 23/26             Goals reviewed with patient; copy given to patient.

## 2021-06-22 NOTE — Progress Notes (Signed)
Daily Session Note  Patient Details  Name: Edward [name] MRN: [id number] Date of Birth: 1956/09/08 Referring Provider:   Flowsheet Row Cardiac Rehab from 04/26/2021 in [facility] Cardiac and Pulmonary Rehab  Referring Provider [name]       Encounter Date: 06/22/2021  Check In:  Session Check In - 06/22/21 1602       Check-In   Supervising physician immediately available to respond to emergencies See telemetry face sheet for immediately available ER MD    Location [facility]-Cardiac & Pulmonary Rehab    Staff Present Renita Papa, RN BSN;Joseph Tessie Fass, RCP,RRT,BSRT;Melissa [place], [place], LDN    Virtual Visit No    Medication changes reported     No    Fall or balance concerns reported    No    Warm-up and Cool-down Performed on first and last piece of equipment    Resistance Training Performed Yes    VAD Patient? No    PAD/SET Patient? No      Pain Assessment   Currently in Pain? No/denies                Social History   Tobacco Use  Smoking Status Former   Packs/day: 1.00   Years: 10.00   Pack years: 10.00   Types: Cigarettes   Quit date: 12/11/2003   Years since quitting: 17.5  Smokeless Tobacco Never    Goals Met:  Independence with exercise equipment Exercise tolerated well No report of cardiac concerns or symptoms Strength training completed today  Goals Unmet:  Not Applicable  Comments:  [name] graduated today from  rehab with 28 sessions completed.  Details of the patient's exercise prescription and what He needs to do in order to continue the prescription and progress were discussed with patient.  Patient was given a copy of prescription and goals.  Patient verbalized understanding.  [name] plans to continue to exercise by using his home treadmill and bike.    Dr. Emily Filbert is Medical Director for [place].  Dr. Ottie Glazier is Medical Director for [facility] Pulmonary Rehabilitation.

## 2021-06-28 ENCOUNTER — Other Ambulatory Visit: Payer: Self-pay

## 2021-06-28 ENCOUNTER — Ambulatory Visit (HOSPITAL_COMMUNITY)
Admission: RE | Admit: 2021-06-28 | Discharge: 2021-06-28 | Disposition: A | Discharge status: Home / Self Care | Payer: Medicare Other | Source: Ambulatory Visit | Attending: Cardiology | Admitting: Cardiology

## 2021-06-28 ENCOUNTER — Telehealth (HOSPITAL_COMMUNITY): Payer: Self-pay | Admitting: Pharmacy Technician

## 2021-06-28 ENCOUNTER — Encounter (HOSPITAL_COMMUNITY): Payer: Self-pay | Admitting: Cardiology

## 2021-06-28 VITALS — BP 124/80 | HR 78 | Wt 213.0 lb

## 2021-06-28 DIAGNOSIS — I5022 Chronic systolic (congestive) heart failure: Secondary | ICD-10-CM | POA: Diagnosis not present

## 2021-06-28 DIAGNOSIS — I255 Ischemic cardiomyopathy: Secondary | ICD-10-CM | POA: Diagnosis not present

## 2021-06-28 LAB — BASIC METABOLIC PANEL
Anion gap: 6 (ref 5–15)
BUN: 14 mg/dL (ref 8–23)
CO2: 23 mmol/L (ref 22–32)
Calcium: 9.2 mg/dL (ref 8.9–10.3)
Chloride: 109 mmol/L (ref 98–111)
Creatinine, Ser: 1.41 mg/dL — ABNORMAL HIGH (ref 0.61–1.24)
GFR, Estimated: 55 mL/min — ABNORMAL LOW (ref >60)
Glucose, Bld: 117 mg/dL — ABNORMAL HIGH (ref 70–99)
Potassium: 4.3 mmol/L (ref 3.5–5.1)
Sodium: 138 mmol/L (ref 135–145)

## 2021-06-28 LAB — DIGOXIN LEVEL: Digoxin Level: 0.3 ng/mL — ABNORMAL LOW (ref 0.8–2.0)

## 2021-06-28 MED ORDER — METOPROLOL SUCCINATE ER 25 MG PO TB24: 25.0000 mg | ORAL_TABLET | Freq: Every day | ORAL | 3 refills | Status: DC

## 2021-06-28 MED ORDER — SPIRONOLACTONE 25 MG PO TABS: 25.0000 mg | ORAL_TABLET | Freq: Every day | ORAL | 3 refills | Status: DC

## 2021-06-28 NOTE — Patient Instructions (Addendum)
EKG done today.   Labs done today. We will contact you only if your labs are abnormal.  INCREASE Metoprolol XL to 25mg  (1 tablet) by mouth daily.  INCREASE Spironolactone to 25mg  (1 tablet) by mouth daily.   No other medication changes were made. Please continue all current medications as prescribed.  Your physician recommends that you schedule a follow-up appointment in: 10 days for a lab only appointment, 3 weeks with our clinic pharmacist and in 2 months with an echo prior to your exam.  If you have any questions or concerns before your next appointment please send a message through [place] or call our office at [phone].    TO LEAVE A MESSAGE FOR THE NURSE SELECT OPTION 2, PLEASE LEAVE A MESSAGE INCLUDING: YOUR NAME DATE OF BIRTH CALL BACK NUMBER REASON FOR CALL**this is important as we prioritize the call backs  YOU WILL RECEIVE A CALL BACK THE SAME DAY AS LONG AS YOU CALL BEFORE 4:00 PM   Do the following things EVERYDAY: Weigh yourself in the morning before breakfast. Write it down and keep it in a log. Take your medicines as prescribed Eat low salt foods--Limit salt (sodium) to 2000 mg per day.  Stay as active as you can everyday Limit all fluids for the day to less than 2 liters   At the [facility], you and your health needs are our priority. As part of our continuing mission to provide you with exceptional heart care, we have created designated Provider Care Teams. These Care Teams include your primary Cardiologist (physician) and Advanced Practice Providers (APPs- Physician Assistants and Nurse Practitioners) who all work together to provide you with the care you need, when you need it.   You may see any of the following providers on your designated Care Team at your next follow up: Dr [place] Dr [phone], NP Arvilla Meres, Carron Curie Robbie Lis, PharmD   Please be sure to bring in all your medications bottles to every  appointment.

## 2021-06-28 NOTE — Telephone Encounter (Signed)
Advanced Heart Failure Patient Advocate Encounter  I received a message from the patient that he was having a cost issue with one of his medications. He requested a return call. Last note I have is concerning a Financial trader.  Called and left the patient a message. Not sure if he needed help with Marcelline Deist, he did not say which medication was the issue. Advised the patient to call back if any other medications were a concern.  Called the patient's pharmacy and gave grant information, co-pay was $0.   Archer Asa, CPhT

## 2021-06-29 NOTE — Progress Notes (Signed)
PCP: Dr Para March  Primary Cardiologist: Dr Shirlee Latch  CT Surgeon: Dr Cliffton Asters.   HPI: Mr Weniger is a 65 y.o. with a history of  CAD, MI 2004 DES x2 totally occluded CFX (infarct-related artery) and 80% mRCA stenosis with taxus DES to both vessels, hyperlipidemia. I seen an ECHO 2011 with EF 40-45%. He does not drink alcohol. Retired from [employer].    Admitted 03/20/21 for scheduled cath. Cath showed multivessel, pwp 30, and cardiac index 1.7. CT surgery consulted. Prior to surgery diuresed with IV lasix.  Had CABG x3. Post op course complicated by AKI. Drips slowly weaned off. GDMT limited by AKI.   Today he returns for HF follow up. He has completed cardiac rehab.  Occasional lightheadedness, no falls.  No significant exertional dyspnea.  No orthopnea/PND.  No chest pain.    ECG (personally reviewed): NSR, LVH with repolarization  Labs (6/22): LDL 50 Labs (7/22): K 4.2, creatinine 1.47  PMH: 1. CAD:  - MI 2004 with DES to occluded LCx and DES to [facility].  - LHC 4/22 with 3VD.  - CABG (4/22): LIMA-LAD, SVG-OM, SVG-PDA.  2. CKD: Stage 3.  3. H/o HCV 4. Hyperlipidemia 5. Chronic systolic CHF: Ischemic cardiomyopathy.  - Echo (2011): EF 40-45%.  - Echo (4/22): EF 20-25%, normal RV systolic function.  - RHC (4/22): mean PCWP 30, CI 1.7  ROS: All systems negative except as listed in HPI, PMH and Problem List.  Social History   Socioeconomic History   Marital status: Married    Spouse name: Dionicio Shelnutt   Number of children: 3   Years of education: 16   Highest education level: Not on file  Occupational History   Occupation: Retired  Tobacco Use   Smoking status: Former    Packs/day: 1.00    Years: 10.00    Pack years: 10.00    Types: Cigarettes    Quit date: 12/11/2003    Years since quitting: 17.5   Smokeless tobacco: Never  Vaping Use   Vaping Use: Never used  Substance and Sexual Activity   Alcohol use: No   Drug use: No   Sexual activity: Not on file  Other Topics  Concern   Not on file  Social History Narrative   Left handed    Married, 1980   Lives in [place]   3 children   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   Likes bass fishing   Caffeine: 2cups coffee or less per day   Social Determinants of Corporate investment banker Strain: Not on file  Food Insecurity: Not on file  Transportation Needs: Not on file  Physical Activity: Not on file  Stress: Not on file  Social Connections: Not on file  Intimate Partner Violence: Not on file    Family History  Problem Relation Age of Onset   Aneurysm Father        brain   Stroke Father    Other Mother        ASCVD   Hypertension Mother    Heart attack Other        uncle   Diabetes Child    Diabetes Child    Colon cancer Neg Hx    Prostate cancer Neg Hx     Current Outpatient Medications  Medication Sig Dispense Refill   aspirin EC 325 MG EC tablet Take 1 tablet (325 mg total) by mouth daily. 30 tablet 0   atorvastatin (LIPITOR) 80  MG tablet Take 1 tablet (80 mg total) by mouth daily. 30 tablet 2   dapagliflozin propanediol (FARXIGA) 10 MG TABS tablet Take 1 tablet (10 mg total) by mouth daily before breakfast. 30 tablet 3   digoxin (LANOXIN) 0.125 MG tablet Take 1 tablet (0.125 mg total) by mouth daily. 30 tablet 2   furosemide (LASIX) 20 MG tablet Take 1 tablet (20 mg total) by mouth as needed (FOR 3 LB WEIGHT GAIN). 45 tablet 3   loratadine (CLARITIN) 10 MG tablet Take 10 mg by mouth daily as needed for allergies.     Multiple Vitamin (MULTIVITAMIN WITH MINERALS) TABS tablet Take 1 tablet by mouth daily.     sacubitril-valsartan (ENTRESTO) 24-26 MG Take 1 tablet by mouth 2 (two) times daily. 60 tablet 2   metoprolol succinate (TOPROL-XL) 25 MG 24 hr tablet Take 1 tablet (25 mg total) by mouth daily. 90 tablet 3   spironolactone (ALDACTONE) 25 MG tablet Take 1 tablet (25 mg total) by mouth daily. 90 tablet 3   No current facility-administered medications for this  encounter.    Vitals:   06/28/21 0843  BP: 124/80  Pulse: 78  SpO2: 97%  Weight: 96.6 kg (213 lb)   Wt Readings from Last 3 Encounters:  06/28/21 96.6 kg (213 lb)  06/19/21 96.2 kg (212 lb 1.6 oz)  06/08/21 97.1 kg (214 lb)    PHYSICAL EXAM: General: NAD Neck: No JVD, no thyromegaly or thyroid nodule.  Lungs: Clear to auscultation bilaterally with normal respiratory effort. CV: Nondisplaced PMI.  Heart regular S1/S2, no S3/S4, no murmur.  No peripheral edema.  No carotid bruit.  Normal pedal pulses.  Abdomen: Soft, nontender, no hepatosplenomegaly, no distention.  Skin: Intact without lesions or rashes.  Neurologic: Alert and oriented x 3.  Psych: Normal affect. Extremities: No clubbing or cyanosis.  HEENT: Normal.   ASSESSMENT & PLAN: 1. CAD: Had MI in 2004 with DESx2 with totally occluded CFX (infarct-related artery) and 80% mRCA stenosis.  Patient had Taxus DES to both vessels.  In 4/22, noted to have EF down to 20-25% on echo, set up for coronary angiography. LHC 4/22 showed high-grade subtotal stenoses of the LAD, left circumflex, and total occlusion of the previously placed stents in the circumflex marginal and RCA.  CMRI with substantial viability.  03/10/21 patient had CABG with LIMA-LAD, SVG-OM, SVG-PDA.  No chest pain.   - Continue ASA 81  - Continue atorvastatin 80 daily.  2. Chronic systolic CHF:  Ischemic cardiomyopathy.  Echo in 4/22 with EF 20-25%, severe LV dilation, mildly dilated RV with normal systolic function, moderate MR.  RHC 4/22 with elevated PCWP and CI 1.7.  Patient had CABG, has been doing well since.  NYHA class II symptoms.  He is not volume overloaded on exam.   - Can continue to use Lasix prn.  - Continue Farxiga 10 mg daily.   - Increase Toprol XL to 25 mg daily.  - continue digoxin 0.125, check level today.  - Continue Entresto 24/26 bid - Increase spironolactone to 25 mg daily.  BMET today and in 10 days.   - Set up echo, ?ICD.  He is not CRT  candidate with narrow QRS.  3. CKD stage 3: BMET today.  4. Hyperlipidemia: Atorvastatin 80 daily, lipids ok in 6/22.   Followup in 3 wks with HF pharmacist for medication titration, see me in 2 months.   Marca Ancona 06/29/2021

## 2021-07-03 ENCOUNTER — Other Ambulatory Visit: Payer: Self-pay | Admitting: Physician Assistant

## 2021-07-10 ENCOUNTER — Other Ambulatory Visit (HOSPITAL_COMMUNITY): Payer: Self-pay

## 2021-07-10 ENCOUNTER — Ambulatory Visit (HOSPITAL_COMMUNITY)
Admission: RE | Admit: 2021-07-10 | Discharge: 2021-07-10 | Disposition: A | Discharge status: Home / Self Care | Payer: Medicare Other | Source: Ambulatory Visit | Attending: Cardiology | Admitting: Cardiology

## 2021-07-10 ENCOUNTER — Other Ambulatory Visit: Payer: Self-pay

## 2021-07-10 DIAGNOSIS — I255 Ischemic cardiomyopathy: Secondary | ICD-10-CM | POA: Diagnosis not present

## 2021-07-10 LAB — BASIC METABOLIC PANEL
Anion gap: 7 (ref 5–15)
BUN: 15 mg/dL (ref 8–23)
CO2: 28 mmol/L (ref 22–32)
Calcium: 9 mg/dL (ref 8.9–10.3)
Chloride: 103 mmol/L (ref 98–111)
Creatinine, Ser: 1.5 mg/dL — ABNORMAL HIGH (ref 0.61–1.24)
GFR, Estimated: 51 mL/min — ABNORMAL LOW (ref >60)
Glucose, Bld: 134 mg/dL — ABNORMAL HIGH (ref 70–99)
Potassium: 3.7 mmol/L (ref 3.5–5.1)
Sodium: 138 mmol/L (ref 135–145)

## 2021-07-10 MED ORDER — DIGOXIN 125 MCG PO TABS: 0.1250 mg | ORAL_TABLET | Freq: Every day | ORAL | 11 refills | Status: DC

## 2021-07-10 MED ORDER — ATORVASTATIN CALCIUM 80 MG PO TABS: 80.0000 mg | ORAL_TABLET | Freq: Every day | ORAL | 11 refills | Status: DC

## 2021-07-12 DIAGNOSIS — Z20822 Contact with and (suspected) exposure to covid-19: Secondary | ICD-10-CM | POA: Diagnosis not present

## 2021-07-14 ENCOUNTER — Other Ambulatory Visit: Payer: Self-pay | Admitting: Physician Assistant

## 2021-07-14 NOTE — Progress Notes (Signed)
PCP: Dr Para March Primary Cardiologist: Dr Shirlee Latch CT Surgeon: Dr Cliffton Asters.  HPI:  [name] is a 65 year old with a history of  CAD, MI 2004 DES x2 totally occluded CFX (infarct-related artery) and 80% mRCA stenosis with taxus DES to both vessels, hyperlipidemia. I seen an ECHO 2011 with EF 40-45%. He does not drink alcohol. Retired from [employer]. Recent ECHO from 03/2021 revealed EF 20-25%.   Admitted 03/20/21  for scheduled cath. Cath showed multivessel, pwp 30, and cardiac index 1.7. CT surgery consulted. Prior to surgery diuresed with IV lasix.  Had CABG x3. Post op course complicated by AKI. Drips slowly weaned off. GDMT limited by AKI.    Returned to HF clinic 4/29 with NP, patient was feeling fine with no major complaints. Farxiga 10mg  was added and Lasix was changed to as needed.   Returned to HF clinic on 06/28/21 with Dr. 06/30/21. He had completed cardiac rehab. Occasional lightheadedness, no falls.  No significant exertional dyspnea.  No orthopnea/PND.  No chest pain.  Today he returns to HF clinic for pharmacist medication titration. At last visit with MD, metoprolol succinate was increased to 25 mg daily and spironolactone was increased to 25 mg daily. Overall he is feeling well today. Notes occasional dizziness, approximately 3x/week. This usually occurs when moving from a sitting to a standing position. BP in clinic 120/84, notes SBP at home is usually 110-115. Notes he is tolerating medications better since he started taking them with food. No chest pain or palpitations. No SOB/DOE. Started going back to the gym this week. Weight has been stable at home. Has not needed any PRN furosemide. No LEE, PND or orthopnea.    HF Medications: Metoprolol succinate 25 mg daily Entresto 24/26 mg BID Spironolactone 25 mg daily Farxiga 10 mg daily Digoxin 0.125 mg daily Furosemide 20 mg daily PRN  Has the patient been experiencing any side effects to the medications prescribed? No  Does the  patient have any problems obtaining medications due to transportation or finances?   Has [facility] Medicare. [facility] was unaffordable, now has PAN grant for Marcelline Deist. I informed him today that the PAN grant could be used for [facility] as well. Of note, he has [employer] Medicare and [facility]. Has been using [facility] Medicare for all his medications. Would be reasonable to use BCBS to fill [facility] in the future as copay cards could be used to reduce the price. For now, will continue to use [facility] Medicare + PAN grant.   Understanding of regimen: good Understanding of indications: good Potential of compliance: good Patient understands to avoid NSAIDs. Patient understands to avoid decongestants.    Pertinent Lab Values: 07/10/21: Serum creatinine 1.50, BUN 15, Potassium 3.7, Sodium 138  Vital Signs: Weight: 213 lbs (last clinic weight: 210.8 lbs) Blood pressure: 120/84 mmHg Heart rate: 71 bpm  Assessment/Plan: 1. CAD: Had MI in 2004 with DESx2 with totally occluded CFX (infarct-related artery) and 80% mRCA stenosis.  Patient had Taxus DES to both vessels.  Noted to have EF down to 20-25% on echo, set up for coronary angiography. LHC on 03/2010 showed high-grade subtotal stenoses of the LAD, left circumflex, and total occlusion of the previously placed stents in the circumflex marginal and RCA.  There are extensive collaterals supplying the distal RCA, and collaterals supplying the obtuse marginal vessel.  Collaterals are jeopardized due to the high-grade subtotal stenoses of the LAD and circumflex vessel.  There are excellent distal targets. CMRI with  substantial viability.  03/10/21 patient had CABG with LIMA-LAD, SVG-OM, SVG-PDA.   - No chest pain.   - Continue ASA 81 mg daily - Continue atorvastatin 80 mg daily   2. Chronic systolic CHF: Echo in 03/2021 with EF 20-25%, severe LV dilation, mildly dilated RV with normal systolic function, moderate MR. Ischemic cardiomyopathy.  RHC 03/20/21 with  elevated PCWP and CI 1.7. cMRI with LV EF 15%, RV EF 31%. - NYHA II. Volume status stable.  - He has not been taking furosemide. Plan to continue furosemide only as needed.  - Continue metoprolol succinate 25 mg daily - Increase Entresto to 49/51 mg BID. Repeat BMET 4 weeks.  - Continue spironolactone 25 mg daily - Continue Farxiga 10 mg daily at bedtime, he is tolerating okay.  - Continue digoxin 0.125 mg daily - Plan to check ECHO 08/2021 after HF meds optimized.   3. CKD stage 3 -Scr 1.50 on BMET 07/10/21   4. Hyperlipidemia: Atorvastatin 80 mg daily.    5. Thrombocytopenia: resolved   Follow up in 1 month with Dr. Duard Brady, PharmD, BCPS, [facility], CPP Heart Failure Clinic Pharmacist [phone]

## 2021-07-26 ENCOUNTER — Other Ambulatory Visit: Payer: Self-pay

## 2021-07-26 ENCOUNTER — Ambulatory Visit (HOSPITAL_COMMUNITY)
Admission: RE | Admit: 2021-07-26 | Discharge: 2021-07-26 | Disposition: A | Discharge status: Home / Self Care | Payer: Medicare Other | Source: Ambulatory Visit | Attending: Cardiology | Admitting: Cardiology

## 2021-07-26 ENCOUNTER — Other Ambulatory Visit (HOSPITAL_COMMUNITY): Payer: Self-pay

## 2021-07-26 VITALS — BP 120/84 | HR 71 | Wt 210.8 lb

## 2021-07-26 DIAGNOSIS — Z79899 Other long term (current) drug therapy: Secondary | ICD-10-CM | POA: Diagnosis not present

## 2021-07-26 DIAGNOSIS — E785 Hyperlipidemia, unspecified: Secondary | ICD-10-CM | POA: Diagnosis not present

## 2021-07-26 DIAGNOSIS — I252 Old myocardial infarction: Secondary | ICD-10-CM | POA: Insufficient documentation

## 2021-07-26 DIAGNOSIS — Z7982 Long term (current) use of aspirin: Secondary | ICD-10-CM | POA: Diagnosis not present

## 2021-07-26 DIAGNOSIS — Z951 Presence of aortocoronary bypass graft: Secondary | ICD-10-CM | POA: Diagnosis not present

## 2021-07-26 DIAGNOSIS — I255 Ischemic cardiomyopathy: Secondary | ICD-10-CM | POA: Diagnosis not present

## 2021-07-26 DIAGNOSIS — I2510 Atherosclerotic heart disease of native coronary artery without angina pectoris: Secondary | ICD-10-CM | POA: Diagnosis not present

## 2021-07-26 DIAGNOSIS — N183 Chronic kidney disease, stage 3 unspecified: Secondary | ICD-10-CM | POA: Diagnosis not present

## 2021-07-26 DIAGNOSIS — I5022 Chronic systolic (congestive) heart failure: Secondary | ICD-10-CM | POA: Insufficient documentation

## 2021-07-26 DIAGNOSIS — D696 Thrombocytopenia, unspecified: Secondary | ICD-10-CM | POA: Insufficient documentation

## 2021-07-26 MED ORDER — SACUBITRIL-VALSARTAN 49-51 MG PO TABS: 1.0000 | ORAL_TABLET | Freq: Two times a day (BID) | ORAL | 11 refills | Status: DC

## 2021-07-26 NOTE — Patient Instructions (Signed)
It was a pleasure seeing you today!  MEDICATIONS: -We are changing your medications today -Increase Entresto to 49/51 mg (1 tablet) twice daily. You may take 2 tablets of the 24/26 mg strength twice daily until you receive the new strength.  -Call if you have questions about your medications.   NEXT APPOINTMENT: Return to clinic in 1 month with Dr. McLean.  In general, to take care of your heart failure: -Limit your fluid intake to 2 Liters (half-gallon) per day.   -Limit your salt intake to ideally 2-3 grams (2000-3000 mg) per day. -Weigh yourself daily and record, and bring that "weight diary" to your next appointment.  (Weight gain of 2-3 pounds in 1 day typically means fluid weight.) -The medications for your heart are to help your heart and help you live longer.   -Please contact us before stopping any of your heart medications.  Call the clinic at [phone] with questions or to reschedule future appointments.  

## 2021-08-08 ENCOUNTER — Ambulatory Visit: Payer: Medicare Other | Admitting: Cardiology

## 2021-08-31 ENCOUNTER — Encounter (HOSPITAL_COMMUNITY): Payer: Self-pay | Admitting: Cardiology

## 2021-08-31 ENCOUNTER — Other Ambulatory Visit: Payer: Self-pay

## 2021-08-31 ENCOUNTER — Ambulatory Visit (HOSPITAL_BASED_OUTPATIENT_CLINIC_OR_DEPARTMENT_OTHER)
Admission: RE | Admit: 2021-08-31 | Discharge: 2021-08-31 | Disposition: A | Discharge status: Home / Self Care | Payer: Medicare Other | Source: Ambulatory Visit | Attending: Cardiology | Admitting: Cardiology

## 2021-08-31 ENCOUNTER — Ambulatory Visit (HOSPITAL_COMMUNITY)
Admission: RE | Admit: 2021-08-31 | Discharge: 2021-08-31 | Disposition: A | Discharge status: Home / Self Care | Payer: Medicare Other | Source: Ambulatory Visit | Attending: Cardiology | Admitting: Cardiology

## 2021-08-31 VITALS — BP 90/58 | HR 68 | Wt 211.8 lb

## 2021-08-31 DIAGNOSIS — I255 Ischemic cardiomyopathy: Secondary | ICD-10-CM | POA: Diagnosis not present

## 2021-08-31 DIAGNOSIS — Z7984 Long term (current) use of oral hypoglycemic drugs: Secondary | ICD-10-CM | POA: Insufficient documentation

## 2021-08-31 DIAGNOSIS — Z7982 Long term (current) use of aspirin: Secondary | ICD-10-CM | POA: Insufficient documentation

## 2021-08-31 DIAGNOSIS — E785 Hyperlipidemia, unspecified: Secondary | ICD-10-CM | POA: Insufficient documentation

## 2021-08-31 DIAGNOSIS — I2510 Atherosclerotic heart disease of native coronary artery without angina pectoris: Secondary | ICD-10-CM | POA: Insufficient documentation

## 2021-08-31 DIAGNOSIS — I5022 Chronic systolic (congestive) heart failure: Secondary | ICD-10-CM

## 2021-08-31 DIAGNOSIS — I252 Old myocardial infarction: Secondary | ICD-10-CM | POA: Insufficient documentation

## 2021-08-31 DIAGNOSIS — N183 Chronic kidney disease, stage 3 unspecified: Secondary | ICD-10-CM | POA: Diagnosis not present

## 2021-08-31 DIAGNOSIS — Z8249 Family history of ischemic heart disease and other diseases of the circulatory system: Secondary | ICD-10-CM | POA: Insufficient documentation

## 2021-08-31 DIAGNOSIS — Z951 Presence of aortocoronary bypass graft: Secondary | ICD-10-CM | POA: Diagnosis not present

## 2021-08-31 DIAGNOSIS — Z87891 Personal history of nicotine dependence: Secondary | ICD-10-CM | POA: Diagnosis not present

## 2021-08-31 DIAGNOSIS — Z79899 Other long term (current) drug therapy: Secondary | ICD-10-CM | POA: Insufficient documentation

## 2021-08-31 LAB — BASIC METABOLIC PANEL
Anion gap: 7 (ref 5–15)
BUN: 21 mg/dL (ref 8–23)
CO2: 25 mmol/L (ref 22–32)
Calcium: 9.3 mg/dL (ref 8.9–10.3)
Chloride: 106 mmol/L (ref 98–111)
Creatinine, Ser: 1.66 mg/dL — ABNORMAL HIGH (ref 0.61–1.24)
GFR, Estimated: 45 mL/min — ABNORMAL LOW (ref >60)
Glucose, Bld: 128 mg/dL — ABNORMAL HIGH (ref 70–99)
Potassium: 4.4 mmol/L (ref 3.5–5.1)
Sodium: 138 mmol/L (ref 135–145)

## 2021-08-31 NOTE — Patient Instructions (Signed)
EKG done today.  Labs done today. We will contact you only if your labs are abnormal.  No medication changes were made. Please continue all current medications as prescribed.  Your physician recommends that you schedule a follow-up appointment in: 3 months  If you have any questions or concerns before your next appointment please send us a message through [place] or call our office at [phone].    TO LEAVE A MESSAGE FOR THE NURSE SELECT OPTION 2, PLEASE LEAVE A MESSAGE INCLUDING: YOUR NAME DATE OF BIRTH CALL BACK NUMBER REASON FOR CALL**this is important as we prioritize the call backs  YOU WILL RECEIVE A CALL BACK THE SAME DAY AS LONG AS YOU CALL BEFORE 4:00 PM   Do the following things EVERYDAY: Weigh yourself in the morning before breakfast. Write it down and keep it in a log. Take your medicines as prescribed Eat low salt foods--Limit salt (sodium) to 2000 mg per day.  Stay as active as you can everyday Limit all fluids for the day to less than 2 liters   At the [facility], you and your health needs are our priority. As part of our continuing mission to provide you with exceptional heart care, we have created designated Provider Care Teams. These Care Teams include your primary Cardiologist (physician) and Advanced Practice Providers (APPs- Physician Assistants and Nurse Practitioners) who all work together to provide you with the care you need, when you need it.   You may see any of the following providers on your designated Care Team at your next follow up: Dr Daniel Bensimhon Dr Dalton McLean Amy Clegg, NP Brittainy Simmons, PA Lauren Kemp, PharmD   Please be sure to bring in all your medications bottles to every appointment.   

## 2021-08-31 NOTE — Progress Notes (Signed)
PCP: Dr Para March  Primary Cardiologist: Dr Shirlee Latch  CT Surgeon: Dr Cliffton Asters.   HPI: Edward [name] is a 65 y.o. with a history of  CAD, MI 2004 DES x2 totally occluded CFX (infarct-related artery) and 80% mRCA stenosis with taxus DES to both vessels, hyperlipidemia. I seen an ECHO 2011 with EF 40-45%. He does not drink alcohol. Retired from [employer].    Admitted 03/20/21 for scheduled cath. Cath showed multivessel, pwp 30, and cardiac index 1.7. CT surgery consulted. Prior to surgery diuresed with IV lasix.  Had CABG x3. Post op course complicated by AKI. Drips slowly weaned off. GDMT limited by AKI.   Today he returns for HF follow up. Doing well, works out at [employer] on most days.  He walks 1 hour on the treadmill and lifts weights.  No dyspnea walking on flat ground or up a flight of stairs.  Fatigues quickly when he runs.  Weight down 2 lbs.  No chest pain.  No lightheadedness.   ECG (personally reviewed): NSR, LVH, right axis deviation  Labs (6/22): LDL 50 Labs (7/22): K 4.2, creatinine 1.47 Labs (8/22): K 3.7, creatinine 1.5  PMH: 1. CAD:  - MI 2004 with DES to occluded LCx and DES to [facility].  - LHC 4/22 with 3VD.  - CABG (4/22): LIMA-LAD, SVG-OM, SVG-PDA.  2. CKD: Stage 3.  3. H/o HCV 4. Hyperlipidemia 5. Chronic systolic CHF: Ischemic cardiomyopathy.  - Echo (2011): EF 40-45%.  - Echo (4/22): EF 20-25%, normal RV systolic function.  - RHC (4/22): mean PCWP 30, CI 1.7  ROS: All systems negative except as listed in HPI, PMH and Problem List.  Social History   Socioeconomic History   Marital status: Married    Spouse name: Donn Zanetti   Number of children: 3   Years of education: 16   Highest education level: Not on file  Occupational History   Occupation: Retired  Tobacco Use   Smoking status: Former    Packs/day: 1.00    Years: 10.00    Pack years: 10.00    Types: Cigarettes    Quit date: 12/11/2003    Years since quitting: 17.7   Smokeless tobacco: Never  Vaping  Use   Vaping Use: Never used  Substance and Sexual Activity   Alcohol use: No   Drug use: No   Sexual activity: Not on file  Other Topics Concern   Not on file  Social History Narrative   Left handed    Married, 1980   Lives in [place]   3 children   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   Likes bass fishing   Caffeine: 2cups coffee or less per day   Social Determinants of Corporate investment banker Strain: Not on file  Food Insecurity: Not on file  Transportation Needs: Not on file  Physical Activity: Not on file  Stress: Not on file  Social Connections: Not on file  Intimate Partner Violence: Not on file    Family History  Problem Relation Age of Onset   Aneurysm Father        brain   Stroke Father    Other Mother        ASCVD   Hypertension Mother    Heart attack Other        uncle   Diabetes Child    Diabetes Child    Colon cancer Neg Hx    Prostate cancer Neg Hx  Current Outpatient Medications  Medication Sig Dispense Refill   aspirin EC 325 MG EC tablet Take 1 tablet (325 mg total) by mouth daily. 30 tablet 0   atorvastatin (LIPITOR) 80 MG tablet Take 1 tablet (80 mg total) by mouth daily. 30 tablet 11   dapagliflozin propanediol (FARXIGA) 10 MG TABS tablet Take 1 tablet (10 mg total) by mouth daily before breakfast. 30 tablet 3   digoxin (LANOXIN) 0.125 MG tablet Take 1 tablet (0.125 mg total) by mouth daily. 30 tablet 11   furosemide (LASIX) 20 MG tablet Take 1 tablet (20 mg total) by mouth as needed (FOR 3 LB WEIGHT GAIN). 45 tablet 3   loratadine (CLARITIN) 10 MG tablet Take 10 mg by mouth daily.     metoprolol succinate (TOPROL-XL) 25 MG 24 hr tablet Take 1 tablet (25 mg total) by mouth daily. 90 tablet 3   Multiple Vitamin (MULTIVITAMIN WITH MINERALS) TABS tablet Take 1 tablet by mouth daily.     sacubitril-valsartan (ENTRESTO) 49-51 MG Take 1 tablet by mouth 2 (two) times daily. 60 tablet 11   spironolactone (ALDACTONE) 25  MG tablet Take 1 tablet (25 mg total) by mouth daily. 90 tablet 3   No current facility-administered medications for this encounter.    Vitals:   08/31/21 0939  BP: (!) 90/58  Pulse: 68  SpO2: 95%  Weight: 96.1 kg (211 lb 12.8 oz)   Wt Readings from Last 3 Encounters:  08/31/21 96.1 kg (211 lb 12.8 oz)  07/26/21 95.6 kg (210 lb 12.8 oz)  06/28/21 96.6 kg (213 lb)    PHYSICAL EXAM: General: NAD Neck: No JVD, no thyromegaly or thyroid nodule.  Lungs: Clear to auscultation bilaterally with normal respiratory effort. CV: Nondisplaced PMI.  Heart regular S1/S2, no S3/S4, no murmur.  No peripheral edema.  No carotid bruit.  Normal pedal pulses.  Abdomen: Soft, nontender, no hepatosplenomegaly, no distention.  Skin: Intact without lesions or rashes.  Neurologic: Alert and oriented x 3.  Psych: Normal affect. Extremities: No clubbing or cyanosis.  HEENT: Normal.   ASSESSMENT & PLAN: 1. CAD: Had MI in 2004 with DESx2 with totally occluded CFX (infarct-related artery) and 80% mRCA stenosis.  Patient had Taxus DES to both vessels.  In 4/22, noted to have EF down to 20-25% on echo, set up for coronary angiography. LHC 4/22 showed high-grade subtotal stenoses of the LAD, left circumflex, and total occlusion of the previously placed stents in the circumflex marginal and RCA.  CMRI with substantial viability.  03/10/21 patient had CABG with LIMA-LAD, SVG-OM, SVG-PDA.  No chest pain.   - Continue ASA 81  - Continue atorvastatin 80 daily, good lipids in 6/22.   2. Chronic systolic CHF:  Ischemic cardiomyopathy.  Echo in 4/22 with EF 20-25%, severe LV dilation, mildly dilated RV with normal systolic function, moderate Edward.  RHC 4/22 with elevated PCWP and CI 1.7.  Patient had CABG, has been doing well since.  NYHA class I symptoms.  He is not volume overloaded on exam.   - Can continue to use Lasix prn.  - Continue Farxiga 10 mg daily.   - Continue Toprol XL 25 mg daily, no BP room to increase.  -  continue digoxin 0.125, check level today.  - Continue Entresto 49/51 bid.  No BP room to increase.  - Continue spironolactone 25 mg daily.   - I will arrange for echo, ICD placement will be needed if EF < 35%.  He is not CRT candidate  with narrow QRS.  3. CKD stage 3: BMET today.  4. Hyperlipidemia: Atorvastatin 80 daily, lipids ok in 6/22.   Followup in 3 months.    Marca Ancona 08/31/2021

## 2021-09-01 LAB — DIGOXIN LEVEL: Digoxin Level: 0.5 ng/mL — ABNORMAL LOW (ref 0.8–2.0)

## 2021-09-21 ENCOUNTER — Other Ambulatory Visit: Payer: Self-pay

## 2021-09-21 ENCOUNTER — Ambulatory Visit (HOSPITAL_COMMUNITY)
Admission: RE | Admit: 2021-09-21 | Discharge: 2021-09-21 | Disposition: A | Discharge status: Home / Self Care | Payer: Medicare Other | Source: Ambulatory Visit | Attending: Cardiology | Admitting: Cardiology

## 2021-09-21 DIAGNOSIS — I5040 Unspecified combined systolic (congestive) and diastolic (congestive) heart failure: Secondary | ICD-10-CM | POA: Diagnosis not present

## 2021-09-21 DIAGNOSIS — I252 Old myocardial infarction: Secondary | ICD-10-CM | POA: Diagnosis not present

## 2021-09-21 DIAGNOSIS — I2510 Atherosclerotic heart disease of native coronary artery without angina pectoris: Secondary | ICD-10-CM | POA: Insufficient documentation

## 2021-09-21 DIAGNOSIS — I219 Acute myocardial infarction, unspecified: Secondary | ICD-10-CM | POA: Diagnosis not present

## 2021-09-21 DIAGNOSIS — I255 Ischemic cardiomyopathy: Secondary | ICD-10-CM

## 2021-09-21 DIAGNOSIS — I429 Cardiomyopathy, unspecified: Secondary | ICD-10-CM | POA: Diagnosis not present

## 2021-09-21 DIAGNOSIS — Z951 Presence of aortocoronary bypass graft: Secondary | ICD-10-CM | POA: Diagnosis not present

## 2021-09-21 NOTE — Progress Notes (Signed)
  Echocardiogram 2D Echocardiogram has been performed.  Edward [name] 09/21/2021, 3:51 PM

## 2021-09-22 ENCOUNTER — Encounter: Payer: Self-pay | Admitting: Cardiology

## 2021-09-22 ENCOUNTER — Ambulatory Visit (INDEPENDENT_AMBULATORY_CARE_PROVIDER_SITE_OTHER): Payer: Medicare Other | Admitting: Cardiology

## 2021-09-22 VITALS — BP 90/60 | HR 70 | Ht 70.0 in | Wt 220.0 lb

## 2021-09-22 DIAGNOSIS — I255 Ischemic cardiomyopathy: Secondary | ICD-10-CM

## 2021-09-22 DIAGNOSIS — I5022 Chronic systolic (congestive) heart failure: Secondary | ICD-10-CM

## 2021-09-22 DIAGNOSIS — N1831 Chronic kidney disease, stage 3a: Secondary | ICD-10-CM | POA: Diagnosis not present

## 2021-09-22 DIAGNOSIS — I2510 Atherosclerotic heart disease of native coronary artery without angina pectoris: Secondary | ICD-10-CM

## 2021-09-22 DIAGNOSIS — E7800 Pure hypercholesterolemia, unspecified: Secondary | ICD-10-CM | POA: Diagnosis not present

## 2021-09-22 DIAGNOSIS — N183 Chronic kidney disease, stage 3 unspecified: Secondary | ICD-10-CM | POA: Insufficient documentation

## 2021-09-22 LAB — ECHOCARDIOGRAM COMPLETE
Area-P 1/2: 3.99 cm2
Calc EF: 24.6 %
S' Lateral: 5.4 cm
Single Plane A2C EF: 21.9 %
Single Plane A4C EF: 31.3 %

## 2021-09-22 MED ORDER — ASPIRIN 81 MG PO TBEC: 81.0000 mg | DELAYED_RELEASE_TABLET | Freq: Every day | ORAL | Status: AC

## 2021-09-22 NOTE — Assessment & Plan Note (Addendum)
03/10/21 CABG LIMA to LAD SVG to OM SVG to PDA.  Doing well without any anginal symptoms. On aspirin 81 mg, atorvastatin 80 mg, LDL 50 at goal, hemoglobin A1c 6.4 creatinine 1.6 potassium 4.4

## 2021-09-22 NOTE — Assessment & Plan Note (Addendum)
I personally reviewed his echocardiogram from yesterday and his ejection fraction still appears to be in the 30% range.  We will go ahead and refer him to electrophysiology for ICD implantation/discussion.  He is a little bit hesitant about this but I think it would behoove him to further discuss with our team.  I agree with Dr. Shirlee Latch.  Last QRS 136 ms, interventricular conduction delay left bundle branch block like

## 2021-09-22 NOTE — Assessment & Plan Note (Signed)
Last creatinine 1.6.  Stable on current medications including Entresto and spironolactone.  Potassium 4.4.

## 2021-09-22 NOTE — Progress Notes (Signed)
Cardiology Office Note:    Date:  09/22/2021   ID:  Edward [name], DOB July 09, 1956, MRN [id number]  PCP:  Edward Nam, MD   [facility] Providers Cardiologist:  Edward Schultz, MD     Referring MD: Edward Nam, MD     History of Present Illness:    Edward [name] is a 65 y.o. male here with follow-up of coronary artery disease status postmyocardial infarction 2004 with DES x2 to totally occluded circumflex artery and 80% mid RCA stenosis with Taxus DES to both vessels, hyperlipidemia with echo back in 2011 ejection fraction 40 to 45%.  No alcohol.  Retired from the state.  On 03/20/2021 he was admitted and scheduled for cardiac catheterization that showed multivessel coronary disease with pulmonary capillary wedge pressure of 30 and index of 1.7.  CT surgery was consulted.  He was diuresed with IV Lasix and ended up having CABG x3.  Postop course was complicated by acute kidney injury, drips were slowly weaned off.  Goal-directed medical therapy was previously limited by acute kidney injury.  He has been doing very well.  Working on the gym most days works 1 hour on the treadmill's and [employer].  No dyspnea when walking on flat ground.  He can fatigue quickly if he tried to run.  Overall today he states that he is feeling fairly well, no major issues.  No chest [name] fevers chills nausea vomiting syncope.  PMH: 1. CAD:  - MI 2004 with DES to occluded LCx and DES to [facility].  - LHC 4/22 with 3VD.  - CABG (4/22): LIMA-LAD, SVG-OM, SVG-PDA.  2. CKD: Stage 3.  3. H/o HCV 4. Hyperlipidemia 5. Chronic systolic CHF: Ischemic cardiomyopathy.  - Echo (2011): EF 40-45%.  - Echo (4/22): EF 20-25%, normal RV systolic function.  - RHC (4/22): mean PCWP 30, CI 1.7   Past Medical History:  Diagnosis Date   CAD (coronary artery disease)    a. STEMI 2004 with occluded Cx and 80% RCA s/p DESx2. b. CABG 03/2021.   Chronic systolic CHF (congestive heart failure) (HCC)    CKD  (chronic kidney disease), stage III (HCC)    HCV antibody positive    previous eval at [facility] hepatology clinic   Ischemic cardiomyopathy    STEMI (ST elevation myocardial infarction) (HCC) 2004   with totally occluded CFX (infarct related artery) and 80% mRCA stenosis. Taxus DES to both vessles. EF was 50-55% by LV-gram   Thrombocytopenia [facility])     Past Surgical History:  Procedure Laterality Date   CORONARY ANGIOPLASTY WITH STENT PLACEMENT  08/22/03   CORONARY ARTERY BYPASS GRAFT N/A 03/22/2021   Procedure: CORONARY ARTERY BYPASS GRAFTING (CABG) X  THREE USING LEFT INTERNAL MAMMARY ARTERY AND RIGHT ENDOSCOPIC SAPHENOUS VEIN HARVEST CONDUITS;  Surgeon: Edward Aliviah Spain, MD;  Location: [facility] OR;  Service: Open Heart Surgery;  Laterality: N/A;   RIGHT/LEFT HEART CATH AND CORONARY ANGIOGRAPHY N/A 03/20/2021   Procedure: RIGHT/LEFT HEART CATH AND CORONARY ANGIOGRAPHY;  Surgeon: Edward Bihari, MD;  Location: [facility] INVASIVE CV LAB;  Service: Cardiovascular;  Laterality: N/A;   TEE WITHOUT CARDIOVERSION N/A 03/22/2021   Procedure: TRANSESOPHAGEAL ECHOCARDIOGRAM (TEE);  Surgeon: Edward Burhanuddin Kohlmann, MD;  Location: [facility] OR;  Service: Open Heart Surgery;  Laterality: N/A;    Current Medications: Current Meds  Medication Sig   atorvastatin (LIPITOR) 80 MG tablet Take 1 tablet (80 mg total) by mouth daily.   dapagliflozin propanediol (FARXIGA) 10 MG TABS tablet Take  1 tablet (10 mg total) by mouth daily before breakfast.   digoxin (LANOXIN) 0.125 MG tablet Take 1 tablet (0.125 mg total) by mouth daily.   furosemide (LASIX) 20 MG tablet Take 1 tablet (20 mg total) by mouth as needed (FOR 3 LB WEIGHT GAIN).   loratadine (CLARITIN) 10 MG tablet Take 10 mg by mouth daily.   metoprolol succinate (TOPROL-XL) 25 MG 24 hr tablet Take 1 tablet (25 mg total) by mouth daily.   Multiple Vitamin (MULTIVITAMIN WITH MINERALS) TABS tablet Take 1 tablet by mouth daily.   sacubitril-valsartan (ENTRESTO) 49-51 MG Take 1  tablet by mouth 2 (two) times daily.   spironolactone (ALDACTONE) 25 MG tablet Take 1 tablet (25 mg total) by mouth daily.   [DISCONTINUED] aspirin EC 325 MG EC tablet Take 1 tablet (325 mg total) by mouth daily.     Allergies:   Patient has no known allergies.   Social History   Socioeconomic History   Marital status: Married    Spouse name: Edward [name]   Number of children: 3   Years of education: 16   Highest education level: Not on file  Occupational History   Occupation: Retired  Tobacco Use   Smoking status: Former    Packs/day: 1.00    Years: 10.00    Pack years: 10.00    Types: Cigarettes    Quit date: 12/11/2003    Years since quitting: 17.7   Smokeless tobacco: Never  Vaping Use   Vaping Use: Never used  Substance and Sexual Activity   Alcohol use: No   Drug use: No   Sexual activity: Not on file  Other Topics Concern   Not on file  Social History Narrative   Left handed    Married, 1980   Lives in [place]   3 children   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 There is no history of Colon cancer or Prostate cancer.  ROS:   Please see the history of present illness.     All other systems reviewed and are negative.  EKGs/Labs/Other Studies Reviewed:    The following studies were reviewed today: Echocardiogram from 09/22/2019.  Personally reviewed-EF appears in the 30% range.  Await final read.  EKG: Prior EKG 08/31/2021-QRS  136, nonspecific interventricular conduction delay, sinus rhythm.  Recent Labs: 02/13/2021: B Natriuretic Peptide 998.0; TSH 2.133 03/14/2021: NT-Pro BNP 4,235 03/27/2021: Magnesium 2.2 04/07/2021: Hemoglobin 13.0; Platelets 359 05/22/2021: ALT 22 08/31/2021: BUN 21; Creatinine, Ser 1.66; Potassium 4.4; Sodium 138  Recent Lipid Panel    Component Value Date/Time   CHOL 99 (L) 05/22/2021 0750   TRIG 72 05/22/2021 0750   HDL 34 (L) 05/22/2021 0750   CHOLHDL 2.9 05/22/2021 0750   CHOLHDL 4.9 03/21/2021 0831   VLDL 12 03/21/2021 0831   LDLCALC 50 05/22/2021 0750     Risk Assessment/Calculations:          Physical  Exam:    VS:  BP 90/60 (BP Location: Left Arm, Patient Position: Sitting, Cuff Size: Normal)   Pulse 70   Ht 5\' 10"  (1.778 m)   Wt 220 lb (99.8 kg)   BMI 31.57 kg/m     Wt Readings from Last 3 Encounters:  09/22/21 220 lb (99.8 kg)  08/31/21 211 lb 12.8 oz (96.1 kg)  07/26/21 210 lb 12.8 oz (95.6 kg)     GEN:  Well nourished, well developed in no acute distress HEENT: Normal NECK: No JVD; No carotid bruits LYMPHATICS: No lymphadenopathy CARDIAC: RRR, no murmurs, rubs, gallops RESPIRATORY:  Clear to auscultation without rales, wheezing or rhonchi  ABDOMEN: Soft, non-tender, non-distended MUSCULOSKELETAL:  No edema; No deformity  SKIN: Warm and dry NEUROLOGIC:  Alert and oriented x 3 PSYCHIATRIC:  Normal affect   ASSESSMENT:    1. Cardiomyopathy, ischemic   2. Ischemic cardiomyopathy   3. Coronary artery disease involving native coronary artery of native heart without angina pectoris   4. Pure hypercholesterolemia   5. Chronic systolic heart failure (HCC)   6. Stage 3a chronic kidney disease (HCC)    PLAN:    In order of problems listed above: Ischemic cardiomyopathy I personally reviewed his echocardiogram from yesterday and his ejection fraction still appears to be in the 30% range.  We will go ahead and refer him to electrophysiology for ICD  implantation/discussion.  He is a little bit hesitant about this but I think it would behoove him to further discuss with our team.  I agree with Dr. Shirlee Latch.  Last QRS 136 ms, interventricular conduction delay left bundle branch block like  Coronary artery disease involving native coronary artery of native heart 03/10/21 CABG LIMA to LAD SVG to OM SVG to PDA.  Doing well without any anginal symptoms. On aspirin 81 mg, atorvastatin 80 mg, LDL 50 at goal, hemoglobin A1c 6.4 creatinine 1.6 potassium 4.4  Hyperlipidemia Doing very well on atorvastatin 80 mg once a day.  LDL 50.  Chronic systolic heart failure (HCC) Continuing with Farxiga 10 mg a day, Toprol-XL 25 mg a day, digoxin 0.125 mg daily, Farxiga 10 mg a day, Entresto 49/51 twice a day, spironolactone 25 mg a day.  Blood pressure today in clinic 90/60 but he is asymptomatic with this.  Sometimes he does state when he is standing if he looks up at the sky, he can feel somewhat dizzy.  He is not feeling dizzy when getting up from a seated position however.  CKD (chronic kidney disease) stage 3, GFR 30-59 ml/min (HCC) Last creatinine 1.6.  Stable on current medications including Entresto and spironolactone.  Potassium 4.4.       Medication Adjustments/Labs and Tests Ordered: Current medicines are reviewed at length with the patient today.  Concerns regarding medicines are outlined above.  Orders Placed This Encounter  Procedures   Ambulatory referral to Cardiac Electrophysiology   Meds ordered this encounter  Medications   aspirin 81 MG EC tablet    Sig: Take 1 tablet (81 mg total) by mouth daily.    Patient Instructions  Medication Instructions:  Please decrease Asprin to 81 mg a day. Continue all other mediations as listed.  *If you need a refill on your cardiac medications before your next appointment, please call your pharmacy*  You have been referred to electrophysiology to discuss the need for implantable  defibrillator,  Follow-Up: At [facility], you and your health needs are our priority.  As part of our continuing  mission to provide you with exceptional heart care, we have created designated Provider Care Teams.  These Care Teams include your primary Cardiologist (physician) and Advanced Practice Providers (APPs -  Physician Assistants and Nurse Practitioners) who all work together to provide you with the care you need, when you need it.  We recommend signing up for the patient portal called "MyChart".  Sign up information is provided on this After Visit Summary.  MyChart is used to connect with patients for Virtual Visits (Telemedicine).  Patients are able to view lab/test results, encounter notes, upcoming appointments, etc.  Non-urgent messages can be sent to your provider as well.   To learn more about what you can do with MyChart, go to [email or web address].    Your next appointment:   6 month(s)  The format for your next appointment:   In Person  Provider:   Donato Schultz, MD   Thank you for choosing [facility]!!   Cardioverter Defibrillator Implantation An implantable cardioverter defibrillator (ICD) is a device that identifies and corrects abnormal heart rhythms. Cardioverter defibrillator implantation is a surgery to place an ICD under the skin in the chest or abdomen. An ICD has a battery, a small computer (pulse generator), and wires (leads) that go into the heart. The ICD detects and corrects two types of dangerous irregular heart rhythms (arrhythmias): A rapid heart rhythm in the lower chambers of the heart (ventricles). This is called ventricular tachycardia. The ventricles contracting in an uncoordinated way. This is called ventricular fibrillation. There are different types of ICDs, and the electrical signals from the ICD can be programmed differently based on the condition being treated. The electrical signals from the ICD can be low-energy pulses,  high-energy shocks, or a combination of the two. The low-energy pulses are generally used to restore the heartbeat to normal when it is either too slow (bradycardia) or too fast. These pulses are painless. The high-energy shocks are used to treat abnormal rhythms such as ventricular tachycardia or ventricular fibrillation. This shock may feel like a strong jolt in the chest. Your health care provider may recommend an ICD if you have: Had a ventricular arrhythmia in the past. A damaged heart because of a disease or heart condition. A weakened heart muscle from a heart attack or cardiac arrest. A congenital heart defect. Long QT syndrome, which is a disorder of the heart's electrical system. Brugada syndrome, which is a condition that causes a disruption of the heart's normal rhythm. Tell a health care provider about: Any allergies you have. All medicines you are taking, including vitamins, herbs, eye drops, creams, and over-the-counter medicines. Any problems you or family members have had with anesthetic medicines. Any blood disorders you have. Any surgeries you have had. Any medical conditions you have. Whether you are pregnant or may be pregnant. What are the risks? Generally, this is a safe procedure. However, problems may occur, including: Infection. Bleeding. Allergic reactions to medicines used during the procedure. Blood clots. Swelling or bruising. Damage to nearby structures or organs, such as nerves, lungs, blood vessels, or the heart where the ICD leads or pulse generator is implanted. What happens before the procedure? Staying hydrated Follow instructions from your health care provider about hydration, which may include: Up to 2 hours before the procedure - you may continue to drink clear liquids, such as water, clear fruit juice, black coffee, and plain tea.  Eating and drinking restrictions Follow instructions from your health care provider about eating and drinking, which  may include: 8 hours before the procedure - stop eating heavy meals or foods, such as meat, fried foods, or fatty foods. 6 hours before the procedure - stop eating light meals or foods, such as toast or cereal. 6 hours before the procedure - stop drinking milk or drinks that contain milk. 2 hours before the procedure - stop drinking clear liquids. Medicines Ask your health care provider about: Changing or stopping your regular medicines. This is especially important if you are taking diabetes medicines or blood thinners. Taking medicines such as aspirin and ibuprofen. These medicines can thin your blood. Do not take these medicines unless your health care provider tells you to take them. Taking over-the-counter medicines, vitamins, herbs, and supplements. Tests You may have an exam or testing. These may include: Blood tests. A test to check the electrical signals in your heart (electrocardiogram, ECG). Imaging tests, such as a chest X-ray. Echocardiogram. This is an ultrasound of your heart to evaluate your heart structures and function. An event monitor or Holter monitor to wear at home. General instructions Do not use any products that contain nicotine or tobacco for at least 4 weeks before the procedure. These products include cigarettes, chewing tobacco, and vaping devices, such as e-cigarettes. If you need help quitting, ask your health care provider. Ask your health care provider: How your procedure site will be marked. What steps will be taken to help prevent infection. These may include: Removing hair at the surgery site. Washing skin with a germ-killing soap. Taking antibiotic medicine. You may be asked to shower with a germ-killing soap. Plan to have a responsible adult take you home from the hospital or clinic. What happens during the procedure?  Small monitors will be put on your body. They will be used to check your heart rate, blood pressure, and oxygen level. A pair of  sticky pads (defibrillator pads) may be placed on your back and chest. These pads are able to pace your heart as needed during the procedure. An IV will be inserted into one of your veins. You will be given one or more of the following: A medicine to help you relax (sedative). A medicine to numb the area (local anesthetic). A medicine to make you fall asleep(general anesthetic). A small incision will be made to create a deep pocket under the skin of your chest or abdomen. Leads will be guided through a blood vessel into your heart and attached to your heart muscles. Depending on the ICD, the leads may go into one ventricle, or they may go into both ventricles and into an upper chamber of the heart. An X-ray machine (fluoroscope) will be used to help guide the leads. The other end of the leads will be attached to the pulse generator. The pulse generator will be placed into the pocket under the skin. The ICD will be tested, and your health care provider will program the ICD for the condition being treated. The incision will be closed with stitches (sutures), skin glue, adhesive strips, or staples. A bandage (dressing) will be placed over the incision. The procedure may vary among health care providers and hospitals. What happens after the procedure? Your blood pressure, heart rate, breathing rate, and blood oxygen level will be monitored until you leave the hospital or clinic. Your health care provider will also monitor your ICD to make sure it is working properly. A chest X-ray will be taken to check that the ICD is in the right place. Do not raise the arm  on the side of your procedure higher than your shoulder for as long as told by your health care provider. This is usually at least 6 weeks. You may be given an identification card explaining that you have an ICD. You will be given a remote home monitoring device to use with your ICD to allow your device to communicate with your  clinic. Summary An implantable cardioverter defibrillator (ICD) is a device that identifies and corrects abnormal heart rhythms. Cardioverter defibrillator implantation is a surgery to place an ICD under the skin in the chest or abdomen. An ICD consists of a battery, a small computer (pulse generator), and wires (leads) that go into the heart. During the procedure, the ICD will be tested, and your health care provider will program the ICD for the condition being treated. After the procedure, a chest X-ray will be taken to check that the ICD is in the right place. This information is not intended to replace advice given to you by your health care provider. Make sure you discuss any questions you have with your health care provider. Document Revised: 05/25/2020 Document Reviewed: 05/25/2020 Elsevier Patient Education  2022 [employer].   Signed, Edward Schultz, MD  09/22/2021 11:10 AM    [facility]

## 2021-09-22 NOTE — Assessment & Plan Note (Signed)
Continuing with Farxiga 10 mg a day, Toprol-XL 25 mg a day, digoxin 0.125 mg daily, Farxiga 10 mg a day, Entresto 49/51 twice a day, spironolactone 25 mg a day.  Blood pressure today in clinic 90/60 but he is asymptomatic with this.  Sometimes he does state when he is standing if he looks up at the sky, he can feel somewhat dizzy.  He is not feeling dizzy when getting up from a seated position however.

## 2021-09-22 NOTE — Assessment & Plan Note (Signed)
Doing very well on atorvastatin 80 mg once a day.  LDL 50.

## 2021-09-22 NOTE — Patient Instructions (Signed)
Medication Instructions:  Please decrease Asprin to 81 mg a day. Continue all other mediations as listed.  *If you need a refill on your cardiac medications before your next appointment, please call your pharmacy*  You have been referred to electrophysiology to discuss the need for implantable defibrillator,  Follow-Up: At [facility], you and your health needs are our priority.  As part of our continuing mission to provide you with exceptional heart care, we have created designated Provider Care Teams.  These Care Teams include your primary Cardiologist (physician) and Advanced Practice Providers (APPs -  Physician Assistants and Nurse Practitioners) who all work together to provide you with the care you need, when you need it.  We recommend signing up for the patient portal called "MyChart".  Sign up information is provided on this After Visit Summary.  MyChart is used to connect with patients for Virtual Visits (Telemedicine).  Patients are able to view lab/test results, encounter notes, upcoming appointments, etc.  Non-urgent messages can be sent to your provider as well.   To learn more about what you can do with MyChart, go to [email or web address].    Your next appointment:   6 month(s)  The format for your next appointment:   In Person  Provider:   Donato Schultz, MD   Thank you for choosing [facility]!!   Cardioverter Defibrillator Implantation An implantable cardioverter defibrillator (ICD) is a device that identifies and corrects abnormal heart rhythms. Cardioverter defibrillator implantation is a surgery to place an ICD under the skin in the chest or abdomen. An ICD has a battery, a small computer (pulse generator), and wires (leads) that go into the heart. The ICD detects and corrects two types of dangerous irregular heart rhythms (arrhythmias): A rapid heart rhythm in the lower chambers of the heart (ventricles). This is called ventricular tachycardia. The  ventricles contracting in an uncoordinated way. This is called ventricular fibrillation. There are different types of ICDs, and the electrical signals from the ICD can be programmed differently based on the condition being treated. The electrical signals from the ICD can be low-energy pulses, high-energy shocks, or a combination of the two. The low-energy pulses are generally used to restore the heartbeat to normal when it is either too slow (bradycardia) or too fast. These pulses are painless. The high-energy shocks are used to treat abnormal rhythms such as ventricular tachycardia or ventricular fibrillation. This shock may feel like a strong jolt in the chest. Your health care provider may recommend an ICD if you have: Had a ventricular arrhythmia in the past. A damaged heart because of a disease or heart condition. A weakened heart muscle from a heart attack or cardiac arrest. A congenital heart defect. Long QT syndrome, which is a disorder of the heart's electrical system. Brugada syndrome, which is a condition that causes a disruption of the heart's normal rhythm. Tell a health care provider about: Any allergies you have. All medicines you are taking, including vitamins, herbs, eye drops, creams, and over-the-counter medicines. Any problems you or family members have had with anesthetic medicines. Any blood disorders you have. Any surgeries you have had. Any medical conditions you have. Whether you are pregnant or may be pregnant. What are the risks? Generally, this is a safe procedure. However, problems may occur, including: Infection. Bleeding. Allergic reactions to medicines used during the procedure. Blood clots. Swelling or bruising. Damage to nearby structures or organs, such as nerves, lungs, blood vessels, or the heart where the ICD leads  or pulse generator is implanted. What happens before the procedure? Staying hydrated Follow instructions from your health care provider  about hydration, which may include: Up to 2 hours before the procedure - you may continue to drink clear liquids, such as water, clear fruit juice, black coffee, and plain tea.  Eating and drinking restrictions Follow instructions from your health care provider about eating and drinking, which may include: 8 hours before the procedure - stop eating heavy meals or foods, such as meat, fried foods, or fatty foods. 6 hours before the procedure - stop eating light meals or foods, such as toast or cereal. 6 hours before the procedure - stop drinking milk or drinks that contain milk. 2 hours before the procedure - stop drinking clear liquids. Medicines Ask your health care provider about: Changing or stopping your regular medicines. This is especially important if you are taking diabetes medicines or blood thinners. Taking medicines such as aspirin and ibuprofen. These medicines can thin your blood. Do not take these medicines unless your health care provider tells you to take them. Taking over-the-counter medicines, vitamins, herbs, and supplements. Tests You may have an exam or testing. These may include: Blood tests. A test to check the electrical signals in your heart (electrocardiogram, ECG). Imaging tests, such as a chest X-ray. Echocardiogram. This is an ultrasound of your heart to evaluate your heart structures and function. An event monitor or Holter monitor to wear at home. General instructions Do not use any products that contain nicotine or tobacco for at least 4 weeks before the procedure. These products include cigarettes, chewing tobacco, and vaping devices, such as e-cigarettes. If you need help quitting, ask your health care provider. Ask your health care provider: How your procedure site will be marked. What steps will be taken to help prevent infection. These may include: Removing hair at the surgery site. Washing skin with a germ-killing soap. Taking antibiotic  medicine. You may be asked to shower with a germ-killing soap. Plan to have a responsible adult take you home from the hospital or clinic. What happens during the procedure?  Small monitors will be put on your body. They will be used to check your heart rate, blood pressure, and oxygen level. A pair of sticky pads (defibrillator pads) may be placed on your back and chest. These pads are able to pace your heart as needed during the procedure. An IV will be inserted into one of your veins. You will be given one or more of the following: A medicine to help you relax (sedative). A medicine to numb the area (local anesthetic). A medicine to make you fall asleep(general anesthetic). A small incision will be made to create a deep pocket under the skin of your chest or abdomen. Leads will be guided through a blood vessel into your heart and attached to your heart muscles. Depending on the ICD, the leads may go into one ventricle, or they may go into both ventricles and into an upper chamber of the heart. An X-ray machine (fluoroscope) will be used to help guide the leads. The other end of the leads will be attached to the pulse generator. The pulse generator will be placed into the pocket under the skin. The ICD will be tested, and your health care provider will program the ICD for the condition being treated. The incision will be closed with stitches (sutures), skin glue, adhesive strips, or staples. A bandage (dressing) will be placed over the incision. The procedure may vary among  health care providers and hospitals. What happens after the procedure? Your blood pressure, heart rate, breathing rate, and blood oxygen level will be monitored until you leave the hospital or clinic. Your health care provider will also monitor your ICD to make sure it is working properly. A chest X-ray will be taken to check that the ICD is in the right place. Do not raise the arm on the side of your procedure higher than  your shoulder for as long as told by your health care provider. This is usually at least 6 weeks. You may be given an identification card explaining that you have an ICD. You will be given a remote home monitoring device to use with your ICD to allow your device to communicate with your clinic. Summary An implantable cardioverter defibrillator (ICD) is a device that identifies and corrects abnormal heart rhythms. Cardioverter defibrillator implantation is a surgery to place an ICD under the skin in the chest or abdomen. An ICD consists of a battery, a small computer (pulse generator), and wires (leads) that go into the heart. During the procedure, the ICD will be tested, and your health care provider will program the ICD for the condition being treated. After the procedure, a chest X-ray will be taken to check that the ICD is in the right place. This information is not intended to replace advice given to you by your health care provider. Make sure you discuss any questions you have with your health care provider. Document Revised: 05/25/2020 Document Reviewed: 05/25/2020 Elsevier Patient Education  2022 [employer].

## 2021-09-25 ENCOUNTER — Telehealth (HOSPITAL_COMMUNITY): Payer: Self-pay | Admitting: Cardiology

## 2021-09-25 DIAGNOSIS — I5022 Chronic systolic (congestive) heart failure: Secondary | ICD-10-CM

## 2021-09-25 NOTE — Telephone Encounter (Signed)
-----   Message from Laurey Morale, MD sent at 09/22/2021  1:27 PM EDT ----- EF 30%. Mildly improved but still low.  Recommend ICD placement, would refer to EP.

## 2021-09-25 NOTE — Telephone Encounter (Signed)
Patient called.  Patient aware. Referral placed 

## 2021-10-02 ENCOUNTER — Telehealth: Payer: Self-pay | Admitting: Cardiology

## 2021-10-02 NOTE — Telephone Encounter (Signed)
Error

## 2021-10-04 ENCOUNTER — Encounter: Payer: Self-pay | Admitting: Cardiology

## 2021-10-05 ENCOUNTER — Encounter: Payer: Medicare Other | Admitting: Family Medicine

## 2021-10-18 ENCOUNTER — Ambulatory Visit (INDEPENDENT_AMBULATORY_CARE_PROVIDER_SITE_OTHER): Payer: Medicare Other | Admitting: Internal Medicine

## 2021-10-18 ENCOUNTER — Other Ambulatory Visit: Payer: Self-pay

## 2021-10-18 VITALS — BP 98/60 | HR 69 | Ht 70.0 in | Wt 221.0 lb

## 2021-10-18 DIAGNOSIS — I255 Ischemic cardiomyopathy: Secondary | ICD-10-CM | POA: Diagnosis not present

## 2021-10-18 NOTE — Patient Instructions (Addendum)
Medication Instructions:  Your physician recommends that you continue on your current medications as directed. Please refer to the Current Medication list given to you today.  Labwork: None ordered.  Testing/Procedures: None ordered.  Follow-Up:  The following dates are available for this procedure:  November 14, 23, 25, 28 December 7, 9, 12, 20, 22, 23  Please contact me if you would like to schedule.  Boneta Lucks RN  Any Other Special Instructions Will Be Listed Below (If Applicable).  If you need a refill on your cardiac medications before your next appointment, please call your pharmacy.   ICD pamphlet given

## 2021-10-18 NOTE — Progress Notes (Signed)
HPI [name] is referred by Dr. Shirlee Latch to consider ICD insertion. He is a pleasant 65 yo man with class 3 CHF, increasingly wide QRS and an ICM. He is s/p PCI. He has longstanding mild LV dysfunction but over the past several months, his EF has gone down further despite GDMT and his QRS duration has increased from 130 to 144 with IVCD/LBBB pattern. The patient has not had syncope. He has minimal peripheral edema. No Known Allergies   Current Outpatient Medications  Medication Sig Dispense Refill   aspirin 81 MG EC tablet Take 1 tablet (81 mg total) by mouth daily.     atorvastatin (LIPITOR) 80 MG tablet Take 1 tablet (80 mg total) by mouth daily. 30 tablet 11   dapagliflozin propanediol (FARXIGA) 10 MG TABS tablet Take 1 tablet (10 mg total) by mouth daily before breakfast. 30 tablet 3   digoxin (LANOXIN) 0.125 MG tablet Take 1 tablet (0.125 mg total) by mouth daily. 30 tablet 11   furosemide (LASIX) 20 MG tablet Take 1 tablet (20 mg total) by mouth as needed (FOR 3 LB WEIGHT GAIN). 45 tablet 3   loratadine (CLARITIN) 10 MG tablet Take 10 mg by mouth daily.     metoprolol succinate (TOPROL-XL) 25 MG 24 hr tablet Take 1 tablet (25 mg total) by mouth daily. 90 tablet 3   Multiple Vitamin (MULTIVITAMIN WITH MINERALS) TABS tablet Take 1 tablet by mouth daily.     sacubitril-valsartan (ENTRESTO) 49-51 MG Take 1 tablet by mouth 2 (two) times daily. 60 tablet 11   spironolactone (ALDACTONE) 25 MG tablet Take 1 tablet (25 mg total) by mouth daily. 90 tablet 3   No current facility-administered medications for this visit.     Past Medical History:  Diagnosis Date   CAD (coronary artery disease)    a. STEMI 2004 with occluded Cx and 80% RCA s/p DESx2. b. CABG 03/2021.   Chronic systolic CHF (congestive heart failure) (HCC)    CKD (chronic kidney disease), stage III (HCC)    HCV antibody positive    previous eval at [facility] hepatology clinic   Ischemic cardiomyopathy    STEMI (ST elevation  myocardial infarction) (HCC) 2004   with totally occluded CFX (infarct related artery) and 80% mRCA stenosis. Taxus DES to both vessles. EF was 50-55% by LV-gram   Thrombocytopenia (HCC)     ROS:   All systems reviewed and negative except as noted in the HPI.   Past Surgical History:  Procedure Laterality Date   CORONARY ANGIOPLASTY WITH STENT PLACEMENT  08/22/03   CORONARY ARTERY BYPASS GRAFT N/A 03/22/2021   Procedure: CORONARY ARTERY BYPASS GRAFTING (CABG) X  THREE USING LEFT INTERNAL MAMMARY ARTERY AND RIGHT ENDOSCOPIC SAPHENOUS VEIN HARVEST CONDUITS;  Surgeon: [name], MD;  Location: [facility] OR;  Service: Open Heart Surgery;  Laterality: N/A;   RIGHT/LEFT HEART CATH AND CORONARY ANGIOGRAPHY N/A 03/20/2021   Procedure: RIGHT/LEFT HEART CATH AND CORONARY ANGIOGRAPHY;  Surgeon: Lennette Bihari, MD;  Location: [facility] INVASIVE CV LAB;  Service: Cardiovascular;  Laterality: N/A;   TEE WITHOUT CARDIOVERSION N/A 03/22/2021   Procedure: TRANSESOPHAGEAL ECHOCARDIOGRAM (TEE);  Surgeon: [name], MD;  Location: [facility] OR;  Service: Open Heart Surgery;  Laterality: N/A;     Family History  Problem Relation Age of Onset   Aneurysm Father        brain   Stroke Father    Other Mother  ASCVD   Hypertension Mother    Heart attack Other        uncle   Diabetes Child    Diabetes Child    Colon cancer Neg Hx    Prostate cancer Neg Hx      Social History   Socioeconomic History   Marital status: Married    Spouse name: Reid Regas   Number of children: 3   Years of education: 16   Highest education level: Not on file  Occupational History   Occupation: Retired  Tobacco Use   Smoking status: Former    Packs/day: 1.00    Years: 10.00    Pack years: 10.00    Types: Cigarettes    Quit date: 12/11/2003    Years since quitting: 17.8   Smokeless tobacco: Never  Vaping Use   Vaping Use: Never used  Substance and Sexual Activity   Alcohol use: No   Drug use: No    Sexual activity: Not on file  Other Topics Concern   Not on file  Social History Narrative   Left handed    Married, 1980   Lives in [place]   3 children   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   Likes bass fishing   Caffeine: 2cups coffee or less per day   Social Determinants of Corporate investment banker Strain: Not on file  Food Insecurity: Not on file  Transportation Needs: Not on file  Physical Activity: Not on file  Stress: Not on file  Social Connections: Not on file  Intimate Partner Violence: Not on file     BP 98/60   Pulse 69   Ht 5\' 10"  (1.778 m)   Wt 221 lb (100.2 kg)   SpO2 96%   BMI 31.71 kg/m   Physical Exam:  Well appearing NAD HEENT: Unremarkable Neck:  No JVD, no thyromegally Lymphatics:  No adenopathy Back:  No CVA tenderness Lungs:  Clear with no wheezes HEART:  Regular rate rhythm, no murmurs, no rubs, no clicks Abd:  soft, positive bowel sounds, no organomegally, no rebound, no guarding Ext:  2 plus pulses, no edema, no cyanosis, no clubbing Skin:  No rashes no nodules Neuro:  CN II through XII intact, motor grossly intact  EKG - nsr with LBBB/IVCD  A/P Chronic systolic heart failure - He is on GDMT and will undergo ICD insertion. The patient has progressivly worsening QRS prolongation and will undergo a biv device. CAD - he denies anginal symptoms.  Dyslipidemia - continue statin therapy.  Edward Hunley,MD

## 2021-12-06 ENCOUNTER — Other Ambulatory Visit: Payer: Self-pay

## 2021-12-06 ENCOUNTER — Ambulatory Visit (HOSPITAL_COMMUNITY)
Admission: RE | Admit: 2021-12-06 | Discharge: 2021-12-06 | Disposition: A | Discharge status: Home / Self Care | Payer: Medicare Other | Source: Ambulatory Visit | Attending: Cardiology | Admitting: Cardiology

## 2021-12-06 VITALS — BP 119/76 | HR 68 | Wt 224.0 lb

## 2021-12-06 DIAGNOSIS — I447 Left bundle-branch block, unspecified: Secondary | ICD-10-CM | POA: Diagnosis not present

## 2021-12-06 DIAGNOSIS — Z7982 Long term (current) use of aspirin: Secondary | ICD-10-CM | POA: Diagnosis not present

## 2021-12-06 DIAGNOSIS — Z955 Presence of coronary angioplasty implant and graft: Secondary | ICD-10-CM | POA: Diagnosis not present

## 2021-12-06 DIAGNOSIS — N183 Chronic kidney disease, stage 3 unspecified: Secondary | ICD-10-CM | POA: Diagnosis not present

## 2021-12-06 DIAGNOSIS — Z79899 Other long term (current) drug therapy: Secondary | ICD-10-CM | POA: Insufficient documentation

## 2021-12-06 DIAGNOSIS — I2510 Atherosclerotic heart disease of native coronary artery without angina pectoris: Secondary | ICD-10-CM | POA: Insufficient documentation

## 2021-12-06 DIAGNOSIS — I252 Old myocardial infarction: Secondary | ICD-10-CM | POA: Insufficient documentation

## 2021-12-06 DIAGNOSIS — I5022 Chronic systolic (congestive) heart failure: Secondary | ICD-10-CM | POA: Insufficient documentation

## 2021-12-06 DIAGNOSIS — E785 Hyperlipidemia, unspecified: Secondary | ICD-10-CM | POA: Diagnosis not present

## 2021-12-06 DIAGNOSIS — Z7984 Long term (current) use of oral hypoglycemic drugs: Secondary | ICD-10-CM | POA: Diagnosis not present

## 2021-12-06 DIAGNOSIS — Z951 Presence of aortocoronary bypass graft: Secondary | ICD-10-CM | POA: Insufficient documentation

## 2021-12-06 LAB — BASIC METABOLIC PANEL
Anion gap: 8 (ref 5–15)
BUN: 19 mg/dL (ref 8–23)
CO2: 20 mmol/L — ABNORMAL LOW (ref 22–32)
Calcium: 9 mg/dL (ref 8.9–10.3)
Chloride: 110 mmol/L (ref 98–111)
Creatinine, Ser: 1.34 mg/dL — ABNORMAL HIGH (ref 0.61–1.24)
GFR, Estimated: 58 mL/min — ABNORMAL LOW (ref >60)
Glucose, Bld: 129 mg/dL — ABNORMAL HIGH (ref 70–99)
Potassium: 4.2 mmol/L (ref 3.5–5.1)
Sodium: 138 mmol/L (ref 135–145)

## 2021-12-06 LAB — DIGOXIN LEVEL: Digoxin Level: 0.3 ng/mL — ABNORMAL LOW (ref 0.8–2.0)

## 2021-12-06 MED ORDER — METOPROLOL SUCCINATE ER 50 MG PO TB24: 50.0000 mg | ORAL_TABLET | Freq: Every day | ORAL | 6 refills | Status: DC

## 2021-12-06 NOTE — Patient Instructions (Addendum)
Medication Changes:  Increase Metoprolol XL to 50 mg Daily  Lab Work:  Labs done today, your results will be available in MyChart, we will contact you for abnormal readings.  Testing/Procedures:  IF YOU ARE INTERESTED IN GETTING A CRT-D DEVICE IMPLANTED PLEASE CALL OUR OFFICE  Referrals:  None  Special Instructions // Education:  Do the following things EVERYDAY: Weigh yourself in the morning before breakfast. Write it down and keep it in a log. Take your medicines as prescribed Eat low salt foods--Limit salt (sodium) to 2000 mg per day.  Stay as active as you can everyday Limit all fluids for the day to less than 2 liters   Follow-Up in:   Please follow up with our heart failure pharmacist in 3 weeks  Your physician recommends that you schedule a follow-up appointment in: 2 months   At the [facility], you and your health needs are our priority. We have a designated team specialized in the treatment of Heart Failure. This Care Team includes your primary Heart Failure Specialized Cardiologist (physician), Advanced Practice Providers (APPs- Physician Assistants and Nurse Practitioners), and Pharmacist who all work together to provide you with the care you need, when you need it.   You may see any of the following providers on your designated Care Team at your next follow up:  Dr Arvilla Meres Dr Carron Curie, NP Robbie Lis, [place] [facility] [place], [place] Karle Plumber, PharmD   Please be sure to bring in all your medications bottles to every appointment.   Need to Contact us:  If you have any questions or concerns before your next appointment please send [place] a message through [employer] or call our office at [phone].    TO LEAVE A MESSAGE FOR THE NURSE SELECT OPTION 2, PLEASE LEAVE A MESSAGE INCLUDING: YOUR NAME DATE OF BIRTH CALL BACK NUMBER REASON FOR CALL**this is important as we prioritize the call backs  YOU  WILL RECEIVE A CALL BACK THE SAME DAY AS LONG AS YOU CALL BEFORE 4:00 PM

## 2021-12-07 NOTE — Progress Notes (Signed)
PCP: Dr Para March  Primary Cardiologist: Dr Shirlee Latch  CT Surgeon: Dr Cliffton Asters.   HPI: [name] is a 64 y.o. with a history of  CAD, MI 2004 DES x2 totally occluded CFX (infarct-related artery) and 80% mRCA stenosis with taxus DES to both vessels, hyperlipidemia. Echo in 2011 showed EF 40-45%. He does not drink alcohol. Retired from [employer].    Admitted 03/20/21 for scheduled cath. Cath showed multivessel, PCWP 30, and cardiac index 1.7. Echo showed EF 20-25%.  CT surgery consulted. Prior to surgery diuresed with IV lasix.  Had CABG x3. Post op course complicated by AKI. Drips slowly weaned off. GDMT limited by AKI.   Echo in 10/22 showed EF 30%, mild LV dilation/mild LVH, mildly decreased RV systolic function.   Today he returns for HF follow up. No significant exertional dyspnea.  Weight is up but he reports dietary indiscretion and less exercise over the holidays.  He has not been going to the gym recently.  No chest pain. No orthopnea/PND.  No palpitations.   ECG (personally reviewed): NSR, LBBB-like IVCD 142 msec, PVC  Labs (6/22): LDL 50 Labs (7/22): K 4.2, creatinine 1.47 Labs (8/22): K 3.7, creatinine 1.5 Labs (9/22): K 4.4, creatinine 1.66  PMH: 1. CAD:  - MI 2004 with DES to occluded LCx and DES to [facility].  - LHC 4/22 with 3VD.  - CABG (4/22): LIMA-LAD, SVG-OM, SVG-PDA.  2. CKD: Stage 3.  3. H/o HCV 4. Hyperlipidemia 5. Chronic systolic CHF: Ischemic cardiomyopathy.  - Echo (2011): EF 40-45%.  - Echo (4/22): EF 20-25%, normal RV systolic function.  - RHC (4/22): mean PCWP 30, CI 1.7 - Echo (10/22): EF 30%, mild LV dilation/mild LVH, mildly decreased RV systolic function.  ROS: All systems negative except as listed in HPI, PMH and Problem List.  Social History   Socioeconomic History   Marital status: Married    Spouse name: Edward [name]   Number of children: 3   Years of education: 16   Highest education level: Not on file  Occupational History   Occupation:  Retired  Tobacco Use   Smoking status: Former    Packs/day: 1.00    Years: 10.00    Pack years: 10.00    Types: Cigarettes    Quit date: 12/11/2003    Years since quitting: 18.0   Smokeless tobacco: Never  Vaping Use   Vaping Use: Never used  Substance and Sexual Activity   Alcohol use: No   Drug use: No   Sexual activity: Not on file  Other Topics Concern   Not on file  Social History Narrative   Left handed    Married, 1980   Lives in [place]   3 children   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   Likes bass fishing   Caffeine: 2cups coffee or less per day   Social Determinants of Corporate investment banker Strain: Not on file  Food Insecurity: Not on file  Transportation Needs: Not on file  Physical Activity: Not on file  Stress: Not on file  Social Connections: Not on file  Intimate Partner Violence: Not on file    Family History  Problem Relation Age of Onset   Aneurysm Father        brain   Stroke Father    Other Mother        ASCVD   Hypertension Mother    Heart attack Other  uncle   Diabetes Child    Diabetes Child    Colon cancer Neg Hx    Prostate cancer Neg Hx     Current Outpatient Medications  Medication Sig Dispense Refill   aspirin 81 MG EC tablet Take 1 tablet (81 mg total) by mouth daily.     atorvastatin (LIPITOR) 80 MG tablet Take 1 tablet (80 mg total) by mouth daily. 30 tablet 11   dapagliflozin propanediol (FARXIGA) 10 MG TABS tablet Take 1 tablet (10 mg total) by mouth daily before breakfast. 30 tablet 3   digoxin (LANOXIN) 0.125 MG tablet Take 1 tablet (0.125 mg total) by mouth daily. 30 tablet 11   furosemide (LASIX) 20 MG tablet Take 1 tablet (20 mg total) by mouth as needed (FOR 3 LB WEIGHT GAIN). 45 tablet 3   loratadine (CLARITIN) 10 MG tablet Take 10 mg by mouth daily.     Multiple Vitamin (MULTIVITAMIN WITH MINERALS) TABS tablet Take 1 tablet by mouth daily.     sacubitril-valsartan (ENTRESTO)  49-51 MG Take 1 tablet by mouth 2 (two) times daily. 60 tablet 11   spironolactone (ALDACTONE) 25 MG tablet Take 1 tablet (25 mg total) by mouth daily. 90 tablet 3   metoprolol succinate (TOPROL-XL) 50 MG 24 hr tablet Take 1 tablet (50 mg total) by mouth daily. 30 tablet 6   No current facility-administered medications for this encounter.    Vitals:   12/06/21 1051  BP: 119/76  Pulse: 68  SpO2: 97%  Weight: 101.6 kg (224 lb)   Wt Readings from Last 3 Encounters:  12/06/21 101.6 kg (224 lb)  10/18/21 100.2 kg (221 lb)  09/22/21 99.8 kg (220 lb)    PHYSICAL EXAM: General: NAD Neck: No JVD, no thyromegaly or thyroid nodule.  Lungs: Clear to auscultation bilaterally with normal respiratory effort. CV: Nondisplaced PMI.  Heart regular S1/S2, no S3/S4, no murmur.  No peripheral edema.  No carotid bruit.  Normal pedal pulses.  Abdomen: Soft, nontender, no hepatosplenomegaly, no distention.  Skin: Intact without lesions or rashes.  Neurologic: Alert and oriented x 3.  Psych: Normal affect. Extremities: No clubbing or cyanosis.  HEENT: Normal.   ASSESSMENT & PLAN: 1. CAD: Had MI in 2004 with DESx2 with totally occluded CFX (infarct-related artery) and 80% mRCA stenosis.  Patient had Taxus DES to both vessels.  In 4/22, noted to have EF down to 20-25% on echo, set up for coronary angiography. LHC 4/22 showed high-grade subtotal stenoses of the LAD, left circumflex, and total occlusion of the previously placed stents in the circumflex marginal and RCA.  CMRI with substantial viability.  03/10/21 patient had CABG with LIMA-LAD, SVG-OM, SVG-PDA.  No chest pain.   - Continue ASA 81  - Continue atorvastatin 80 daily, good lipids in 6/22.   2. Chronic systolic CHF:  Ischemic cardiomyopathy.  Echo in 4/22 with EF 20-25%, severe LV dilation, mildly dilated RV with normal systolic function, moderate MR.  RHC 4/22 with elevated PCWP and CI 1.7.  Patient had CABG, has been doing well since.  Echo in  10/22 with EF 30%.  NYHA class I symptoms.  He is not volume overloaded.  - Can continue to use Lasix prn.  - Continue Farxiga 10 mg daily.   - Increase Toprol XL to 50 mg daily.   - continue digoxin 0.125, check level today.  - Continue Entresto 49/51 bid.   - Continue spironolactone 25 mg daily.  BMET today.  - EF 30% on  recent echo with LBBB-like IVCD 142 msec on today's ECG.  He qualifies for ICD, possibly CRT-D (borderline).  He has seen EP and device was recommended.  He is still thinking about it.  I encouraged him to get an ICD.  He will call if he decides to have it done.   3. CKD stage 3: BMET today.  4. Hyperlipidemia: Atorvastatin 80 daily, lipids ok in 6/22.   Followup in 3 wks with HF pharmacist, followup APP 2 months.  Call if you decide to have ICD placed.   Marca Ancona 12/07/2021

## 2021-12-08 ENCOUNTER — Other Ambulatory Visit (HOSPITAL_COMMUNITY): Payer: Self-pay | Admitting: Adult Health

## 2021-12-13 ENCOUNTER — Other Ambulatory Visit (HOSPITAL_COMMUNITY): Payer: Self-pay

## 2021-12-13 NOTE — Progress Notes (Signed)
PCP: Dr Para March Primary Cardiologist: Dr Shirlee Latch CT Surgeon: Dr Cliffton Asters.  HPI:  Edward [name] is a 66 year old with a history of  CAD, MI 2004 DES x2 totally occluded CFX (infarct-related artery) and 80% mRCA stenosis with taxus DES to both vessels, hyperlipidemia. I seen an ECHO 2011 with EF 40-45%. He does not drink alcohol. Retired from [employer]. Recent ECHO from 03/2021 revealed EF 20-25%.   Admitted 03/20/21  for scheduled cath. Cath showed multivessel, pwp 30, and cardiac index 1.7. CT surgery consulted. Prior to surgery diuresed with IV lasix.  Had CABG x3. Post op course complicated by AKI. Drips slowly weaned off. GDMT limited by AKI.   Echo in 09/2021 showed EF 30%, mild LV dilation/mild LVH, mildly decreased RV systolic function.   Returned to HF clinic on 12/06/21 with Dr. Shirlee Latch. No significant exertional dyspnea.  Weight was up but he reported dietary indiscretion and less exercise over the holidays.  He had not been going to the gym recently.  No chest pain. No orthopnea/PND.  No palpitations.   Today he returns to HF clinic for pharmacist medication titration. At last visit with MD, metoprolol succinate was increased to 50 mg daily. Overall he is feeling well today. No dizziness or lightheadedness. Still feels significant fatigue, notes it is difficult to get up in the morning. However, he does feel better since the carvedilol was changed to metoprolol. Takes all his medications at night to help. No SOB/DOE. Has started going back to the gym consistently. Does 30 minutes on the treadmill and 30 minutes of strength training. Weight is stable at home, 210-212 lbs. Weight is down 10 lbs from last visit which he attributes to going back to the gym. Has not needed any PRN Lasix. No LEE, PND or orthopnea. BP in clinic 112/70, SBP at home 105-110. Of note, he has only been taking Entresto once daily (did not realize it was a twice daily medication).    HF Medications: Metoprolol succinate 50 mg  daily Entresto 49/51 mg BID Spironolactone 25 mg daily Farxiga 10 mg daily Digoxin 0.125 mg daily Furosemide 20 mg daily PRN  Has the patient been experiencing any side effects to the medications prescribed? No  Does the patient have any problems obtaining medications due to transportation or finances?   Has [employer] Medicare. Marcelline Deist and Sherryll Burger were unaffordable, so a PAN grant was obtained. Now has run out of PAN grant. However, he has benefits through the [facility]. Benefits will start next month. Provided copay cards for Marcelline Deist and Sherryll Burger so he can use those next month.   Understanding of regimen: good Understanding of indications: good Potential of compliance: good Patient understands to avoid NSAIDs. Patient understands to avoid decongestants.    Pertinent Lab Values: 12/06/21: Serum creatinine 1.34, BUN 19, Potassium 4.2, Sodium 138  Vital Signs:  Weight: 214.6 lbs (last clinic weight: 224 lbs) Blood pressure: 112/70 mmHg Heart rate: 60 bpm  Assessment/Plan: 1. CAD: Had MI in 2004 with DESx2 with totally occluded CFX (infarct-related artery) and 80% mRCA stenosis.  Patient had Taxus DES to both vessels.  In 03/2021, noted to have EF down to 20-25% on echo, set up for coronary angiography. LHC 03/2021 showed high-grade subtotal stenoses of the LAD, left circumflex, and total occlusion of the previously placed stents in the circumflex marginal and RCA.  CMRI with substantial viability.  03/10/21 patient had CABG with LIMA-LAD, SVG-OM, SVG-PDA.   - No chest pain.   - Continue ASA 81  mg daily - Continue atorvastatin 80 mg daily   2. Chronic systolic CHF:  Ischemic cardiomyopathy.  Echo in 03/2021 with EF 20-25%, severe LV dilation, mildly dilated RV with normal systolic function, moderate Edward.  RHC 03/2021 with elevated PCWP and CI 1.7.  Patient had CABG, has been doing well since.  Echo in 09/2021 with EF 30%.  - NYHA I-II symptoms. Volume status stable.  - Continue Lasix PRN only -  Continue metoprolol succinate 50 mg daily - Start taking Entresto 49/51 mg BID, currently only taking once daily.  - Continue spironolactone 25 mg daily - Continue Farxiga 10 mg daily - Continue digoxin 0.125 mg daily - EF 30% on recent echo with LBBB-like IVCD 142 msec on 12/06/21 ECG.  He qualifies for ICD, possibly CRT-D (borderline).  He has seen EP and device was recommended.  He is still thinking about it.  He has been encouraged to get an ICD.  He will call if he decides to have it done.     3. CKD stage 3 -Scr 1.34 on BMET 12/06/21   4. Hyperlipidemia: Atorvastatin 80 mg daily.     Follow up 6 weeks with [facility].   Karle Plumber, PharmD, BCPS, BCCP, CPP Heart Failure Clinic Pharmacist [phone]

## 2021-12-26 ENCOUNTER — Other Ambulatory Visit: Payer: Self-pay

## 2021-12-26 ENCOUNTER — Other Ambulatory Visit (HOSPITAL_COMMUNITY): Payer: Self-pay

## 2021-12-26 ENCOUNTER — Ambulatory Visit (HOSPITAL_COMMUNITY)
Admission: RE | Admit: 2021-12-26 | Discharge: 2021-12-26 | Disposition: A | Discharge status: Home / Self Care | Payer: Medicare PPO | Source: Ambulatory Visit | Attending: Internal Medicine | Admitting: Internal Medicine

## 2021-12-26 VITALS — BP 112/70 | HR 60 | Wt 214.6 lb

## 2021-12-26 DIAGNOSIS — I2510 Atherosclerotic heart disease of native coronary artery without angina pectoris: Secondary | ICD-10-CM | POA: Insufficient documentation

## 2021-12-26 DIAGNOSIS — I5022 Chronic systolic (congestive) heart failure: Secondary | ICD-10-CM

## 2021-12-26 DIAGNOSIS — Z79899 Other long term (current) drug therapy: Secondary | ICD-10-CM | POA: Diagnosis not present

## 2021-12-26 DIAGNOSIS — N183 Chronic kidney disease, stage 3 unspecified: Secondary | ICD-10-CM | POA: Diagnosis not present

## 2021-12-26 DIAGNOSIS — E785 Hyperlipidemia, unspecified: Secondary | ICD-10-CM | POA: Diagnosis not present

## 2021-12-26 DIAGNOSIS — I252 Old myocardial infarction: Secondary | ICD-10-CM | POA: Diagnosis not present

## 2021-12-26 DIAGNOSIS — I255 Ischemic cardiomyopathy: Secondary | ICD-10-CM | POA: Insufficient documentation

## 2021-12-26 DIAGNOSIS — Z951 Presence of aortocoronary bypass graft: Secondary | ICD-10-CM | POA: Insufficient documentation

## 2021-12-26 DIAGNOSIS — Z7982 Long term (current) use of aspirin: Secondary | ICD-10-CM | POA: Diagnosis not present

## 2021-12-26 DIAGNOSIS — Z955 Presence of coronary angioplasty implant and graft: Secondary | ICD-10-CM | POA: Diagnosis not present

## 2021-12-26 DIAGNOSIS — Z7984 Long term (current) use of oral hypoglycemic drugs: Secondary | ICD-10-CM | POA: Diagnosis not present

## 2021-12-26 DIAGNOSIS — I447 Left bundle-branch block, unspecified: Secondary | ICD-10-CM | POA: Diagnosis not present

## 2021-12-26 NOTE — Patient Instructions (Addendum)
It was a pleasure seeing you today!  MEDICATIONS: -We are changing your medications today -Increase Entresto to 49/51 mg (1 tablet) twice daily -Call if you have questions about your medications.   NEXT APPOINTMENT: Return to clinic in 6 weeks with [facility].  In general, to take care of your heart failure: -Limit your fluid intake to 2 Liters (half-gallon) per day.   -Limit your salt intake to ideally 2-3 grams (2000-3000 mg) per day. -Weigh yourself daily and record, and bring that "weight diary" to your next appointment.  (Weight gain of 2-3 pounds in 1 day typically means fluid weight.) -The medications for your heart are to help your heart and help you live longer.   -Please contact us before stopping any of your heart medications.  Call the clinic at [phone] with questions or to reschedule future appointments.

## 2022-02-06 NOTE — Progress Notes (Signed)
? ? ?PCP: Dr Para March  ?Primary Cardiologist: Dr Shirlee Latch  ?CT Surgeon: Dr Cliffton Asters.  ? ?HPI: ?[name] is a 66 y.o. with a history of  CAD, MI 2004 DES x2 totally occluded CFX (infarct-related artery) and 80% mRCA stenosis with taxus DES to both vessels, hyperlipidemia. Echo in 2011 showed EF 40-45%. He does not drink alcohol. Retired from [employer].  ?  ?Admitted 03/20/21 for scheduled cath. Cath showed multivessel, PCWP 30, and cardiac index 1.7. Echo showed EF 20-25%.  CT surgery consulted. Prior to surgery diuresed with IV lasix.  Had CABG x3. Post op course complicated by AKI. Drips slowly weaned off. GDMT limited by AKI.  ? ?Echo in 10/22 showed EF 30%, mild LV dilation/mild LVH, mildly decreased RV systolic function.  ? ?Today he returns for HF follow up. Overall feeling fine. He started back at the gym this week, walked on TM for 30 minutes without dyspnea. Denies palpitations, abnormal bleeding, CP, dizziness, edema, or PND/Orthopnea. Appetite ok. No fever or chills. Weight at home 213 pounds. Taking all medications. ? ?ECG (personally reviewed): none ordered today. ? ?Labs (6/22): LDL 50 ?Labs (7/22): K 4.2, creatinine 1.47 ?Labs (8/22): K 3.7, creatinine 1.5 ?Labs (9/22): K 4.4, creatinine 1.66 ?Labs (12/22): K 4.2, creatinine 1.34 ? ?PMH: ?1. CAD:  ?- MI 2004 with DES to occluded LCx and DES to [facility].  ?- LHC 4/22 with 3VD.  ?- CABG (4/22): LIMA-LAD, SVG-OM, SVG-PDA.  ?2. CKD: Stage 3.  ?3. H/o HCV ?4. Hyperlipidemia ?5. Chronic systolic CHF: Ischemic cardiomyopathy.  ?- Echo (2011): EF 40-45%.  ?- Echo (4/22): EF 20-25%, normal RV systolic function.  ?- RHC (4/22): mean PCWP 30, CI 1.7 ?- Echo (10/22): EF 30%, mild LV dilation/mild LVH, mildly decreased RV systolic function. ? ?ROS: All systems negative except as listed in HPI, PMH and Problem List. ? ?Social History  ? ?Socioeconomic History  ? Marital status: Married  ?  Spouse name: [name]  ? Number of children: 3  ? Years of education: 68  ?  Highest education level: Not on file  ?Occupational History  ? Occupation: Retired  ?Tobacco Use  ? Smoking status: Former  ?  Packs/day: 1.00  ?  Years: 10.00  ?  Pack years: 10.00  ?  Types: Cigarettes  ?  Quit date: 12/11/2003  ?  Years since quitting: 18.1  ? Smokeless tobacco: Never  ?Vaping Use  ? Vaping Use: Never used  ?Substance and Sexual Activity  ? Alcohol use: No  ? Drug use: No  ? Sexual activity: Not on file  ?Other Topics Concern  ? Not on file  ?Social History Narrative  ? Left handed   ? Married, 1980  ? Lives in [place]  ? 3 children  ? 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 Likes bass fishing  ? Caffeine: 2cups coffee or less per day  ? ?Social Determinants of Health  ? ?Financial Resource Strain: Not on file  ?Food Insecurity: Not on file  ?Transportation Needs: Not on file  ?Physical Activity: Not on file  ?Stress: Not on file  ?Social Connections: Not on file  ?Intimate Partner Violence: Not on file  ? ?Family History  ?Problem Relation Age of Onset  ? Aneurysm Father   ?     brain  ? Stroke Father   ? Other Mother   ?     ASCVD  ? Hypertension Mother   ? Heart attack Other   ?  uncle  ? Diabetes Child   ? Diabetes Child   ? Colon cancer Neg Hx   ? Prostate cancer Neg Hx   ? ?Current Outpatient Medications  ?Medication Sig Dispense Refill  ? aspirin 81 MG EC tablet Take 1 tablet (81 mg total) by mouth daily.    ? atorvastatin (LIPITOR) 80 MG tablet Take 1 tablet (80 mg total) by mouth daily. 30 tablet 11  ? digoxin (LANOXIN) 0.125 MG tablet Take 1 tablet (0.125 mg total) by mouth daily. 30 tablet 11  ? FARXIGA 10 MG TABS tablet TAKE 1 TABLET BY MOUTH ONCE DAILY BEFORE BREAKFAST 30 tablet 6  ? loratadine (CLARITIN) 10 MG tablet Take 10 mg by mouth daily.    ? metoprolol succinate (TOPROL-XL) 50 MG 24 hr tablet Take 1 tablet (50 mg total) by mouth daily. 30 tablet 6  ? Multiple Vitamin (MULTIVITAMIN WITH MINERALS) TABS tablet Take 1 tablet by mouth daily.    ?  sacubitril-valsartan (ENTRESTO) 49-51 MG Take 1 tablet by mouth 2 (two) times daily. 60 tablet 11  ? spironolactone (ALDACTONE) 25 MG tablet Take 1 tablet (25 mg total) by mouth daily. 90 tablet 3  ? furosemide (LASIX) 20 MG tablet Take 1 tablet (20 mg total) by mouth as needed (FOR 3 LB WEIGHT GAIN). (Patient not taking: Reported on 02/07/2022) 45 tablet 3  ? ?No current facility-administered medications for this encounter.  ? ?BP (!) 86/48   Pulse 64   Wt 97.3 kg (214 lb 6.4 oz)   SpO2 97%   BMI 30.76 kg/m?  ? ?Wt Readings from Last 3 Encounters:  ?02/07/22 97.3 kg (214 lb 6.4 oz)  ?12/26/21 97.3 kg (214 lb 9.6 oz)  ?12/06/21 101.6 kg (224 lb)  ? ?PHYSICAL EXAM: ?General:  NAD. No resp difficulty ?HEENT: Normal ?Neck: Supple. No JVD. Carotids 2+ bilat; no bruits. No lymphadenopathy or thryomegaly appreciated. ?Cor: PMI nondisplaced. Regular rate & rhythm. No rubs, gallops or murmurs. ?Lungs: Clear ?Abdomen: Soft, nontender, nondistended. No hepatosplenomegaly. No bruits or masses. Good bowel sounds. ?Extremities: No cyanosis, clubbing, rash, edema ?Neuro: Alert & oriented x 3, cranial nerves grossly intact. Moves all 4 extremities w/o difficulty. Affect pleasant. ? ?ASSESSMENT & PLAN: ?1. CAD: Had MI in 2004 with DESx2 with totally occluded CFX (infarct-related artery) and 80% mRCA stenosis.  Patient had Taxus DES to both vessels.  In 4/22, noted to have EF down to 20-25% on echo, set up for coronary angiography. LHC 4/22 showed high-grade subtotal stenoses of the LAD, left circumflex, and total occlusion of the previously placed stents in the circumflex marginal and RCA.  CMRI with substantial viability.  03/10/21 patient had CABG with LIMA-LAD, SVG-OM, SVG-PDA.  No chest pain.   ?- Continue ASA 81  ?- Continue atorvastatin 80 daily, good lipids in 6/22.   ?2. Chronic systolic CHF:  Ischemic cardiomyopathy.  Echo in 4/22 with EF 20-25%, severe LV dilation, mildly dilated RV with normal systolic function, moderate  MR.  RHC 4/22 with elevated PCWP and CI 1.7.  Patient had CABG, has been doing well since.  Echo in 10/22 with EF 30%.  NYHA class I symptoms.  He is not volume overloaded.  ?- Can continue to use Lasix prn.  ?- Continue Farxiga 10 mg daily.   ?- Continue Toprol XL 50 mg daily.   ?- Continue digoxin 0.125, check level today.  ?- With low BP, decrease Entresto to 24/26 mg bid.   ?- Continue spironolactone 25 mg daily.  BMET today.  ?- EF 30% on recent echo with LBBB-like IVCD 142 msec on recent ECG.  He qualifies for ICD, possibly CRT-D (borderline).  He has seen EP and device was recommended. I encouraged him to get an ICD.  Currently not interest, but will call if he decides to have it done. ?3. CKD stage 3: BMET today.  ?4. Hyperlipidemia: Atorvastatin 80 daily, lipids ok in 6/22.  ? ?Followup in 3 months with Dr. Shirlee Latch. Call if you decide to have ICD placed.  ? ?Jacklynn Ganong FNP ?02/07/2022 ? ?

## 2022-02-07 ENCOUNTER — Ambulatory Visit (HOSPITAL_COMMUNITY)
Admission: RE | Admit: 2022-02-07 | Discharge: 2022-02-07 | Disposition: A | Discharge status: Home / Self Care | Payer: Medicare Other | Source: Ambulatory Visit | Attending: Family Medicine | Admitting: Family Medicine

## 2022-02-07 ENCOUNTER — Encounter (HOSPITAL_COMMUNITY): Payer: Self-pay

## 2022-02-07 ENCOUNTER — Other Ambulatory Visit: Payer: Self-pay

## 2022-02-07 VITALS — BP 86/48 | HR 64 | Wt 214.4 lb

## 2022-02-07 DIAGNOSIS — Z79899 Other long term (current) drug therapy: Secondary | ICD-10-CM | POA: Insufficient documentation

## 2022-02-07 DIAGNOSIS — N183 Chronic kidney disease, stage 3 unspecified: Secondary | ICD-10-CM | POA: Diagnosis not present

## 2022-02-07 DIAGNOSIS — E785 Hyperlipidemia, unspecified: Secondary | ICD-10-CM | POA: Insufficient documentation

## 2022-02-07 DIAGNOSIS — I252 Old myocardial infarction: Secondary | ICD-10-CM | POA: Diagnosis not present

## 2022-02-07 DIAGNOSIS — I255 Ischemic cardiomyopathy: Secondary | ICD-10-CM | POA: Insufficient documentation

## 2022-02-07 DIAGNOSIS — I5022 Chronic systolic (congestive) heart failure: Secondary | ICD-10-CM | POA: Diagnosis not present

## 2022-02-07 DIAGNOSIS — I2510 Atherosclerotic heart disease of native coronary artery without angina pectoris: Secondary | ICD-10-CM | POA: Diagnosis not present

## 2022-02-07 DIAGNOSIS — Z955 Presence of coronary angioplasty implant and graft: Secondary | ICD-10-CM | POA: Insufficient documentation

## 2022-02-07 DIAGNOSIS — Z951 Presence of aortocoronary bypass graft: Secondary | ICD-10-CM | POA: Insufficient documentation

## 2022-02-07 DIAGNOSIS — Z87891 Personal history of nicotine dependence: Secondary | ICD-10-CM | POA: Diagnosis not present

## 2022-02-07 DIAGNOSIS — N1831 Chronic kidney disease, stage 3a: Secondary | ICD-10-CM

## 2022-02-07 LAB — BASIC METABOLIC PANEL
Anion gap: 8 (ref 5–15)
BUN: 17 mg/dL (ref 8–23)
CO2: 24 mmol/L (ref 22–32)
Calcium: 9.5 mg/dL (ref 8.9–10.3)
Chloride: 108 mmol/L (ref 98–111)
Creatinine, Ser: 1.46 mg/dL — ABNORMAL HIGH (ref 0.61–1.24)
GFR, Estimated: 53 mL/min — ABNORMAL LOW (ref >60)
Glucose, Bld: 132 mg/dL — ABNORMAL HIGH (ref 70–99)
Potassium: 4.2 mmol/L (ref 3.5–5.1)
Sodium: 140 mmol/L (ref 135–145)

## 2022-02-07 LAB — DIGOXIN LEVEL: Digoxin Level: 0.5 ng/mL — ABNORMAL LOW (ref 0.8–2.0)

## 2022-02-07 MED ORDER — ENTRESTO 24-26 MG PO TABS: 1.0000 | ORAL_TABLET | Freq: Two times a day (BID) | ORAL | 5 refills | Status: DC

## 2022-02-07 NOTE — Patient Instructions (Signed)
Thank you for coming in today ? ?Labs were done today, if any labs are abnormal the clinic will call you ? ?DECREASE Entresto 24/26 mg 1 tablet twice daily  ? ?Your physician recommends that you schedule a follow-up appointment in:  ?3 months with Dr. Shirlee Latch ? ?At the [facility], you and your health needs are our priority. As part of our continuing mission to provide you with exceptional heart care, we have created designated Provider Care Teams. These Care Teams include your primary Cardiologist (physician) and Advanced Practice Providers (APPs- Physician Assistants and Nurse Practitioners) who all work together to provide you with the care you need, when you need it.  ? ?You may see any of the following providers on your designated Care Team at your next follow up: ?Dr Arvilla Meres ?Dr Marca Ancona ?Tonye Becket, NP ?Robbie Lis, PA ?Jessica Milford,NP ?Anna Genre, PA ?Karle Plumber, PharmD ? ? ?Please be sure to bring in all your medications bottles to every appointment.  ? ?If you have any questions or concerns before your next appointment please send [place] a message through [place] or call our office at [phone].   ? ?TO LEAVE A MESSAGE FOR THE NURSE SELECT OPTION 2, PLEASE LEAVE A MESSAGE INCLUDING: ?YOUR NAME ?DATE OF BIRTH ?CALL BACK NUMBER ?REASON FOR CALL**this is important as we prioritize the call backs ? ?YOU WILL RECEIVE A CALL BACK THE SAME DAY AS LONG AS YOU CALL BEFORE 4:00 PM ? ?

## 2022-05-11 ENCOUNTER — Ambulatory Visit (HOSPITAL_COMMUNITY)
Admission: RE | Admit: 2022-05-11 | Discharge: 2022-05-11 | Disposition: A | Discharge status: Home / Self Care | Payer: Medicare Other | Source: Ambulatory Visit | Attending: Cardiology | Admitting: Cardiology

## 2022-05-11 VITALS — BP 106/66 | HR 76 | Ht 70.0 in | Wt 216.0 lb

## 2022-05-11 DIAGNOSIS — Z79899 Other long term (current) drug therapy: Secondary | ICD-10-CM | POA: Insufficient documentation

## 2022-05-11 DIAGNOSIS — I252 Old myocardial infarction: Secondary | ICD-10-CM | POA: Insufficient documentation

## 2022-05-11 DIAGNOSIS — I5022 Chronic systolic (congestive) heart failure: Secondary | ICD-10-CM | POA: Insufficient documentation

## 2022-05-11 DIAGNOSIS — I255 Ischemic cardiomyopathy: Secondary | ICD-10-CM | POA: Diagnosis not present

## 2022-05-11 DIAGNOSIS — N1831 Chronic kidney disease, stage 3a: Secondary | ICD-10-CM | POA: Diagnosis not present

## 2022-05-11 DIAGNOSIS — E785 Hyperlipidemia, unspecified: Secondary | ICD-10-CM | POA: Diagnosis not present

## 2022-05-11 DIAGNOSIS — I2510 Atherosclerotic heart disease of native coronary artery without angina pectoris: Secondary | ICD-10-CM | POA: Insufficient documentation

## 2022-05-11 DIAGNOSIS — N183 Chronic kidney disease, stage 3 unspecified: Secondary | ICD-10-CM | POA: Insufficient documentation

## 2022-05-11 DIAGNOSIS — Z951 Presence of aortocoronary bypass graft: Secondary | ICD-10-CM | POA: Diagnosis not present

## 2022-05-11 DIAGNOSIS — Z955 Presence of coronary angioplasty implant and graft: Secondary | ICD-10-CM | POA: Diagnosis not present

## 2022-05-11 LAB — BASIC METABOLIC PANEL
Anion gap: 7 (ref 5–15)
BUN: 19 mg/dL (ref 8–23)
CO2: 23 mmol/L (ref 22–32)
Calcium: 9.3 mg/dL (ref 8.9–10.3)
Chloride: 109 mmol/L (ref 98–111)
Creatinine, Ser: 1.6 mg/dL — ABNORMAL HIGH (ref 0.61–1.24)
GFR, Estimated: 47 mL/min — ABNORMAL LOW (ref >60)
Glucose, Bld: 96 mg/dL (ref 70–99)
Potassium: 3.9 mmol/L (ref 3.5–5.1)
Sodium: 139 mmol/L (ref 135–145)

## 2022-05-11 NOTE — Patient Instructions (Signed)
There has been no changes to your medications.  Labs done today, your results will be available in MyChart, we will contact you for abnormal readings.  Your physician has requested that you have an echocardiogram. Echocardiography is a painless test that uses sound waves to create images of your heart. It provides your doctor with information about the size and shape of your heart and how well your heart's chambers and valves are working. This procedure takes approximately one hour. There are no restrictions for this procedure.  Your physician recommends that you schedule a follow-up appointment in: 4 months (October 2023)  with Echocardiogram **Please call the office in August to arrange your follow up appointment **   If you have any questions or concerns before your next appointment please send [place] a message through [place] or call our office at [phone].    TO LEAVE A MESSAGE FOR THE NURSE SELECT OPTION 2, PLEASE LEAVE A MESSAGE INCLUDING: YOUR NAME DATE OF BIRTH CALL BACK NUMBER REASON FOR CALL**this is important as we prioritize the call backs  YOU WILL RECEIVE A CALL BACK THE SAME DAY AS LONG AS YOU CALL BEFORE 4:00 PM  At the [facility], you and your health needs are our priority. As part of our continuing mission to provide you with exceptional heart care, we have created designated Provider Care Teams. These Care Teams include your primary Cardiologist (physician) and Advanced Practice Providers (APPs- Physician Assistants and Nurse Practitioners) who all work together to provide you with the care you need, when you need it.   You may see any of the following providers on your designated Care Team at your next follow up: Dr Arvilla Meres Dr Carron Curie, NP Robbie Lis, [facility], [place] Karle Plumber, PharmD   Please be sure to bring in all your medications bottles to every appointment.

## 2022-05-11 NOTE — Progress Notes (Signed)
PCP: Dr Para March  Primary Cardiologist: Dr Shirlee Latch  CT Surgeon: Dr Cliffton Asters.   HPI: Mr Korn is a 66 y.o. with a history of  CAD, MI 2004 DES x2 totally occluded CFX (infarct-related artery) and 80% mRCA stenosis with taxus DES to both vessels, hyperlipidemia. Echo in 2011 showed EF 40-45%. He does not drink alcohol. Retired from [employer].    Admitted 03/20/21 for scheduled cath. Cath showed multivessel, PCWP 30, and cardiac index 1.7. Echo showed EF 20-25%.  CT surgery consulted. Prior to surgery diuresed with IV lasix.  Had CABG x3. Post op course complicated by AKI. Drips slowly weaned off. GDMT limited by AKI.   Echo in 10/22 showed EF 30%, mild LV dilation/mild LVH, mildly decreased RV systolic function.   Today he returns for HF follow up.Overall feeling fine. Denies SOB/PND/Orthopnea. No chest pain. Going to gym 5 days a week. Appetite ok. No fever or chills. Weight at home has been stable.  Taking all medications.   Labs (6/22): LDL 50 Labs (7/22): K 4.2, creatinine 1.47 Labs (8/22): K 3.7, creatinine 1.5 Labs (9/22): K 4.4, creatinine 1.66 Labs (12/22): K 4.2, creatinine 1.34 Lass (02/07/22): K 4.2 Creatinine 1.46  PMH: 1. CAD:  - MI 2004 with DES to occluded LCx and DES to [facility].  - LHC 4/22 with 3VD.  - CABG (4/22): LIMA-LAD, SVG-OM, SVG-PDA.  2. CKD: Stage 3.  3. H/o HCV 4. Hyperlipidemia 5. Chronic systolic CHF: Ischemic cardiomyopathy.  - Echo (2011): EF 40-45%.  - Echo (4/22): EF 20-25%, normal RV systolic function.  - RHC (4/22): mean PCWP 30, CI 1.7 - Echo (10/22): EF 30%, mild LV dilation/mild LVH, mildly decreased RV systolic function.  ROS: All systems negative except as listed in HPI, PMH and Problem List.  Social History   Socioeconomic History   Marital status: Married    Spouse name: Joseguadalupe Stan   Number of children: 3   Years of education: 16   Highest education level: Not on file  Occupational History   Occupation: Retired  Tobacco Use    Smoking status: Former    Packs/day: 1.00    Years: 10.00    Pack years: 10.00    Types: Cigarettes    Quit date: 12/11/2003    Years since quitting: 18.4   Smokeless tobacco: Never  Vaping Use   Vaping Use: Never used  Substance and Sexual Activity   Alcohol use: No   Drug use: No   Sexual activity: Not on file  Other Topics Concern   Not on file  Social History Narrative   Left handed    Married, 1980   Lives in [place]   3 children   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   Likes bass fishing   Caffeine: 2cups coffee or less per day   Social Determinants of Corporate investment banker Strain: Not on file  Food Insecurity: Not on file  Transportation Needs: Not on file  Physical Activity: Not on file  Stress: Not on file  Social Connections: Not on file  Intimate Partner Violence: Not on file   Family History  Problem Relation Age of Onset   Aneurysm Father        brain   Stroke Father    Other Mother        ASCVD   Hypertension Mother    Heart attack Other        uncle   Diabetes Child  Diabetes Child    Colon cancer Neg Hx    Prostate cancer Neg Hx    Current Outpatient Medications  Medication Sig Dispense Refill   aspirin 81 MG EC tablet Take 1 tablet (81 mg total) by mouth daily.     atorvastatin (LIPITOR) 80 MG tablet Take 1 tablet (80 mg total) by mouth daily. 30 tablet 11   digoxin (LANOXIN) 0.125 MG tablet Take 1 tablet (0.125 mg total) by mouth daily. 30 tablet 11   FARXIGA 10 MG TABS tablet TAKE 1 TABLET BY MOUTH ONCE DAILY BEFORE BREAKFAST 30 tablet 6   loratadine (CLARITIN) 10 MG tablet Take 10 mg by mouth daily.     metoprolol succinate (TOPROL-XL) 50 MG 24 hr tablet Take 1 tablet (50 mg total) by mouth daily. 30 tablet 6   Multiple Vitamin (MULTIVITAMIN WITH MINERALS) TABS tablet Take 1 tablet by mouth daily.     sacubitril-valsartan (ENTRESTO) 24-26 MG Take 1 tablet by mouth 2 (two) times daily. 60 tablet 5    spironolactone (ALDACTONE) 25 MG tablet Take 1 tablet (25 mg total) by mouth daily. 90 tablet 3   furosemide (LASIX) 20 MG tablet Take 1 tablet (20 mg total) by mouth as needed (FOR 3 LB WEIGHT GAIN). (Patient not taking: Reported on 02/07/2022) 45 tablet 3   No current facility-administered medications for this encounter.   BP 106/66   Pulse 76   Ht 5\' 10"  (1.778 m)   Wt 98 kg (216 lb)   SpO2 97%   BMI 30.99 kg/m  Vitals:   05/11/22 1132  BP: 106/66  Pulse: 76  SpO2: 97%     Wt Readings from Last 3 Encounters:  05/11/22 98 kg (216 lb)  02/07/22 97.3 kg (214 lb 6.4 oz)  12/26/21 97.3 kg (214 lb 9.6 oz)   PHYSICAL EXAM: General:  Well appearing. No resp difficulty HEENT: normal Neck: supple. no JVD. Carotids 2+ bilat; no bruits. No lymphadenopathy or thryomegaly appreciated. Cor: PMI nondisplaced. Regular rate & rhythm. No rubs, gallops or murmurs. Lungs: clear Abdomen: soft, nontender, nondistended. No hepatosplenomegaly. No bruits or masses. Good bowel sounds. Extremities: no cyanosis, clubbing, rash, edema Neuro: alert & orientedx3, cranial nerves grossly intact. moves all 4 extremities w/o difficulty. Affect pleasant  ASSESSMENT & PLAN: 1. CAD: Had MI in 2004 with DESx2 with totally occluded CFX (infarct-related artery) and 80% mRCA stenosis.  Patient had Taxus DES to both vessels.  In 4/22, noted to have EF down to 20-25% on echo, set up for coronary angiography. LHC 4/22 showed high-grade subtotal stenoses of the LAD, left circumflex, and total occlusion of the previously placed stents in the circumflex marginal and RCA.  CMRI with substantial viability.  03/10/21 patient had CABG with LIMA-LAD, SVG-OM, SVG-PDA.   - No chest pain.  - Continue ASA 81  - Continue atorvastatin 80 daily, good lipids in 6/22.   2. Chronic systolic CHF:  Ischemic cardiomyopathy.  Echo in 4/22 with EF 20-25%, severe LV dilation, mildly dilated RV with normal systolic function, moderate MR.  RHC 4/22  with elevated  - Echo 09/2021 EF 30%.  - NYHA I. Volume status. He does not need diuretics.   - Continue Farxiga 10 mg daily.   - Continue Toprol XL 50 mg daily.   - Continue digoxin 0.125, dig level 0.3 (11/2021).  - Continue Entresto to 24/26 mg bid.   - Continue spironolactone 25 mg daily.   - Check BMET  - EF 30% on recent  echo with LBBB-like IVCD 142 msec on recent ECG.  He qualifies for ICD, possibly CRT-D (borderline).  He has seen EP and device was recommended. We discussed again but he does not want to pursue.  3. CKD stage 3: BMET today.  4. Hyperlipidemia: Atorvastatin 80 daily, lipids ok in 6/22.    Follow up in 4 months.   Nathanyal Ashmead NP-C  05/11/2022

## 2022-06-04 DIAGNOSIS — M5417 Radiculopathy, lumbosacral region: Secondary | ICD-10-CM | POA: Diagnosis not present

## 2022-06-04 DIAGNOSIS — M62830 Muscle spasm of back: Secondary | ICD-10-CM | POA: Diagnosis not present

## 2022-06-04 DIAGNOSIS — M24272 Disorder of ligament, left ankle: Secondary | ICD-10-CM | POA: Diagnosis not present

## 2022-06-04 DIAGNOSIS — M9903 Segmental and somatic dysfunction of lumbar region: Secondary | ICD-10-CM | POA: Diagnosis not present

## 2022-06-15 ENCOUNTER — Telehealth (HOSPITAL_COMMUNITY): Payer: Self-pay | Admitting: *Deleted

## 2022-06-15 NOTE — Telephone Encounter (Signed)
Received fax from [facility], pt needs clearance for fillings with local anesthetic w/epinephrine  Per Dr Shirlee Latch: "ok for treatment, pt safe for treatment in dental setting"  Form faxed back to them at [phone]

## 2022-06-27 ENCOUNTER — Encounter (HOSPITAL_COMMUNITY): Payer: Self-pay | Admitting: Cardiology

## 2022-07-02 NOTE — Telephone Encounter (Signed)
While digoxin can cause swollen breasts/gynecomastia, it is much more likely to be caused by spironolactone. Would recommend stopping the spironolactone and getting him follow up in [facility].

## 2022-08-07 ENCOUNTER — Other Ambulatory Visit (HOSPITAL_COMMUNITY): Payer: Self-pay | Admitting: Cardiology

## 2022-08-07 ENCOUNTER — Other Ambulatory Visit: Payer: Self-pay | Admitting: Physician Assistant

## 2022-08-22 ENCOUNTER — Other Ambulatory Visit (HOSPITAL_COMMUNITY): Payer: Self-pay | Admitting: Cardiology

## 2022-08-22 ENCOUNTER — Other Ambulatory Visit (HOSPITAL_COMMUNITY): Payer: Self-pay | Admitting: Adult Health

## 2022-09-10 ENCOUNTER — Encounter (HOSPITAL_COMMUNITY): Payer: Self-pay | Admitting: Cardiology

## 2022-09-10 ENCOUNTER — Other Ambulatory Visit (HOSPITAL_COMMUNITY): Payer: Self-pay | Admitting: Cardiology

## 2022-09-10 ENCOUNTER — Ambulatory Visit (HOSPITAL_COMMUNITY)
Admission: RE | Admit: 2022-09-10 | Discharge: 2022-09-10 | Disposition: A | Discharge status: Home / Self Care | Payer: Medicare Other | Source: Ambulatory Visit | Attending: Cardiology | Admitting: Cardiology

## 2022-09-10 ENCOUNTER — Ambulatory Visit (HOSPITAL_BASED_OUTPATIENT_CLINIC_OR_DEPARTMENT_OTHER)
Admission: RE | Admit: 2022-09-10 | Discharge: 2022-09-10 | Disposition: A | Discharge status: Home / Self Care | Payer: Medicare Other | Source: Ambulatory Visit | Attending: Cardiology | Admitting: Cardiology

## 2022-09-10 VITALS — BP 110/68 | HR 58 | Wt 214.4 lb

## 2022-09-10 DIAGNOSIS — I447 Left bundle-branch block, unspecified: Secondary | ICD-10-CM | POA: Diagnosis not present

## 2022-09-10 DIAGNOSIS — I255 Ischemic cardiomyopathy: Secondary | ICD-10-CM | POA: Insufficient documentation

## 2022-09-10 DIAGNOSIS — E7800 Pure hypercholesterolemia, unspecified: Secondary | ICD-10-CM

## 2022-09-10 DIAGNOSIS — Z7984 Long term (current) use of oral hypoglycemic drugs: Secondary | ICD-10-CM | POA: Diagnosis not present

## 2022-09-10 DIAGNOSIS — I252 Old myocardial infarction: Secondary | ICD-10-CM | POA: Insufficient documentation

## 2022-09-10 DIAGNOSIS — Z955 Presence of coronary angioplasty implant and graft: Secondary | ICD-10-CM | POA: Diagnosis not present

## 2022-09-10 DIAGNOSIS — I2510 Atherosclerotic heart disease of native coronary artery without angina pectoris: Secondary | ICD-10-CM | POA: Diagnosis not present

## 2022-09-10 DIAGNOSIS — N183 Chronic kidney disease, stage 3 unspecified: Secondary | ICD-10-CM | POA: Diagnosis not present

## 2022-09-10 DIAGNOSIS — Z79899 Other long term (current) drug therapy: Secondary | ICD-10-CM | POA: Insufficient documentation

## 2022-09-10 DIAGNOSIS — I5022 Chronic systolic (congestive) heart failure: Secondary | ICD-10-CM

## 2022-09-10 DIAGNOSIS — E785 Hyperlipidemia, unspecified: Secondary | ICD-10-CM | POA: Diagnosis not present

## 2022-09-10 DIAGNOSIS — N62 Hypertrophy of breast: Secondary | ICD-10-CM | POA: Insufficient documentation

## 2022-09-10 DIAGNOSIS — Z951 Presence of aortocoronary bypass graft: Secondary | ICD-10-CM | POA: Diagnosis not present

## 2022-09-10 LAB — COMPREHENSIVE METABOLIC PANEL
ALT: 25 U/L (ref 0–44)
AST: 27 U/L (ref 15–41)
Albumin: 3.8 g/dL (ref 3.5–5.0)
Alkaline Phosphatase: 54 U/L (ref 38–126)
Anion gap: 5 (ref 5–15)
BUN: 21 mg/dL (ref 8–23)
CO2: 23 mmol/L (ref 22–32)
Calcium: 8.8 mg/dL — ABNORMAL LOW (ref 8.9–10.3)
Chloride: 112 mmol/L — ABNORMAL HIGH (ref 98–111)
Creatinine, Ser: 1.5 mg/dL — ABNORMAL HIGH (ref 0.61–1.24)
GFR, Estimated: 51 mL/min — ABNORMAL LOW (ref >60)
Glucose, Bld: 107 mg/dL — ABNORMAL HIGH (ref 70–99)
Potassium: 4.5 mmol/L (ref 3.5–5.1)
Sodium: 140 mmol/L (ref 135–145)
Total Bilirubin: 0.8 mg/dL (ref 0.3–1.2)
Total Protein: 6.3 g/dL — ABNORMAL LOW (ref 6.5–8.1)

## 2022-09-10 LAB — LIPID PANEL
Cholesterol: 91 mg/dL (ref 0–200)
HDL: 26 mg/dL — ABNORMAL LOW (ref >40)
LDL Cholesterol: 44 mg/dL (ref 0–99)
Total CHOL/HDL Ratio: 3.5 RATIO
Triglycerides: 103 mg/dL (ref <150)
VLDL: 21 mg/dL (ref 0–40)

## 2022-09-10 LAB — DIGOXIN LEVEL: Digoxin Level: 0.2 ng/mL — ABNORMAL LOW (ref 0.8–2.0)

## 2022-09-10 LAB — ECHOCARDIOGRAM COMPLETE
Area-P 1/2: 5.13 cm2
S' Lateral: 5.75 cm

## 2022-09-10 LAB — BRAIN NATRIURETIC PEPTIDE: B Natriuretic Peptide: 367.4 pg/mL — ABNORMAL HIGH (ref 0.0–100.0)

## 2022-09-10 MED ORDER — EPLERENONE 25 MG PO TABS: 25.0000 mg | ORAL_TABLET | Freq: Every day | ORAL | 3 refills | Status: DC

## 2022-09-10 MED ORDER — ENTRESTO 24-26 MG PO TABS: 1.0000 | ORAL_TABLET | Freq: Two times a day (BID) | ORAL | 5 refills | Status: DC

## 2022-09-10 NOTE — Patient Instructions (Signed)
START Elperenone 25mg  daily.  PLEASE MAKE SURE YOU TAKE YOUR ENTRESTO TWICE DAILY.  Labs done today, your results will be available in MyChart, we will contact you for abnormal readings.  Repeat blood work in 10 days  Please follow up with our heart failure pharmacist in 3 weeks  Your physician recommends that you schedule a follow-up appointment in: 3 months   If you have any questions or concerns before your next appointment please send [place] a message through [place] or call our office at [phone].    TO LEAVE A MESSAGE FOR THE NURSE SELECT OPTION 2, PLEASE LEAVE A MESSAGE INCLUDING: YOUR NAME DATE OF BIRTH CALL BACK NUMBER REASON FOR CALL**this is important as we prioritize the call backs  YOU WILL RECEIVE A CALL BACK THE SAME DAY AS LONG AS YOU CALL BEFORE 4:00 PM  At the [facility], you and your health needs are our priority. As part of our continuing mission to provide you with exceptional heart care, we have created designated Provider Care Teams. These Care Teams include your primary Cardiologist (physician) and Advanced Practice Providers (APPs- Physician Assistants and Nurse Practitioners) who all work together to provide you with the care you need, when you need it.   You may see any of the following providers on your designated Care Team at your next follow up: Dr Glori Bickers Dr Loralie Champagne Dr. Roxana Hires, NP Lyda Jester, [place] [facility] [place], [place] Forestine Na, NP Audry Riles, PharmD   Please be sure to bring in all your medications bottles to every appointment.

## 2022-09-10 NOTE — Progress Notes (Signed)
PCP: Dr Para March  Primary Cardiologist: Dr Shirlee Latch  CT Surgeon: Dr Cliffton Asters.   HPI: Mr Mcglory is a 66 y.o. with a history of  CAD, MI 2004 DES x2 totally occluded CFX (infarct-related artery) and 80% mRCA stenosis with taxus DES to both vessels, hyperlipidemia. Echo in 2011 showed EF 40-45%. He does not drink alcohol. Retired from [employer].    Admitted 03/20/21 for scheduled cath. Cath showed multivessel, PCWP 30, and cardiac index 1.7. Echo showed EF 20-25%.  CT surgery consulted. Prior to surgery diuresed with IV lasix.  Had CABG x3. Post op course complicated by AKI. Drips slowly weaned off. GDMT limited by AKI.   Echo in 10/22 showed EF 30%, mild LV dilation/mild LVH, mildly decreased RV systolic function. Echo was done today and reviewed, EF 25-30%, mild LV dilation with septal-lateral dyssynchrony, mildly decreased RV systolic function, normal IVC.   Today he returns for HF follow up.  Patient is doing well symptomatically.  He is retired and goes to [employer] several days/week.  He lifts weights and walks on the treadmill.  No exertional dyspnea or chest pain. No orthopnea/PND.  No lightheadedness.   ECG (personally reviewed): NSR, IVCD 142 msec  Labs (6/22): LDL 50 Labs (7/22): K 4.2, creatinine 1.47 Labs (8/22): K 3.7, creatinine 1.5 Labs (9/22): K 4.4, creatinine 1.66 Labs (6/23): K 3.9, creatinine 1.6  PMH: 1. CAD:  - MI 2004 with DES to occluded LCx and DES to [facility].  - LHC 4/22 with 3VD.  - CABG (4/22): LIMA-LAD, SVG-OM, SVG-PDA.  2. CKD: Stage 3.  3. H/o HCV 4. Hyperlipidemia 5. Chronic systolic CHF: Ischemic cardiomyopathy.  - Echo (2011): EF 40-45%.  - Echo (4/22): EF 20-25%, normal RV systolic function.  - RHC (4/22): mean PCWP 30, CI 1.7 - Echo (10/22): EF 30%, mild LV dilation/mild LVH, mildly decreased RV systolic function. - Echo (10/23): EF 25-30%, mild LV dilation with septal-lateral dyssynchrony, mildly decreased RV systolic function, normal IVC.   ROS: All  systems negative except as listed in HPI, PMH and Problem List.  Social History   Socioeconomic History   Marital status: Married    Spouse name: Xzavian Semmel   Number of children: 3   Years of education: 16   Highest education level: Not on file  Occupational History   Occupation: Retired  Tobacco Use   Smoking status: Former    Packs/day: 1.00    Years: 10.00    Total pack years: 10.00    Types: Cigarettes    Quit date: 12/11/2003    Years since quitting: 18.7   Smokeless tobacco: Never  Vaping Use   Vaping Use: Never used  Substance and Sexual Activity   Alcohol use: No   Drug use: No   Sexual activity: Not on file  Other Topics Concern   Not on file  Social History Narrative   Left handed    Married, 1980   Lives in [place]   3 children   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   Likes bass fishing   Caffeine: 2cups coffee or less per day   Social Determinants of Corporate investment banker Strain: Not on file  Food Insecurity: Not on file  Transportation Needs: Not on file  Physical Activity: Not on file  Stress: Not on file  Social Connections: Not on file  Intimate Partner Violence: Not on file    Family History  Problem Relation Age of  Onset   Aneurysm Father        brain   Stroke Father    Other Mother        ASCVD   Hypertension Mother    Heart attack Other        uncle   Diabetes Child    Diabetes Child    Colon cancer Neg Hx    Prostate cancer Neg Hx     Current Outpatient Medications  Medication Sig Dispense Refill   aspirin 81 MG EC tablet Take 1 tablet (81 mg total) by mouth daily.     atorvastatin (LIPITOR) 80 MG tablet Take 1 tablet by mouth once daily 30 tablet 11   digoxin (LANOXIN) 0.125 MG tablet Take 1 tablet by mouth once daily 90 tablet 3   eplerenone (INSPRA) 25 MG tablet Take 1 tablet (25 mg total) by mouth daily. 90 tablet 3   FARXIGA 10 MG TABS tablet TAKE 1 TABLET BY MOUTH ONCE DAILY BEFORE BREAKFAST  30 tablet 11   furosemide (LASIX) 20 MG tablet Take 20 mg by mouth as needed.     loratadine (CLARITIN) 10 MG tablet Take 10 mg by mouth daily.     metoprolol succinate (TOPROL-XL) 50 MG 24 hr tablet Take 1 tablet (50 mg total) by mouth daily. 30 tablet 6   Multiple Vitamin (MULTIVITAMIN WITH MINERALS) TABS tablet Take 1 tablet by mouth daily.     sacubitril-valsartan (ENTRESTO) 24-26 MG Take 1 tablet by mouth 2 (two) times daily. 60 tablet 5   No current facility-administered medications for this encounter.    Vitals:   09/10/22 1041  BP: 110/68  Pulse: (!) 58  SpO2: 99%  Weight: 97.3 kg (214 lb 6.4 oz)   Wt Readings from Last 3 Encounters:  09/10/22 97.3 kg (214 lb 6.4 oz)  05/11/22 98 kg (216 lb)  02/07/22 97.3 kg (214 lb 6.4 oz)    PHYSICAL EXAM: General: NAD Neck: No JVD, no thyromegaly or thyroid nodule.  Lungs: Clear to auscultation bilaterally with normal respiratory effort. CV: Nondisplaced PMI.  Heart regular S1/S2, no S3/S4, no murmur.  No peripheral edema.  No carotid bruit.  Normal pedal pulses.  Abdomen: Soft, nontender, no hepatosplenomegaly, no distention.  Skin: Intact without lesions or rashes.  Neurologic: Alert and oriented x 3.  Psych: Normal affect. Extremities: No clubbing or cyanosis.  HEENT: Normal.   ASSESSMENT & PLAN: 1. CAD: Had MI in 2004 with DESx2 with totally occluded CFX (infarct-related artery) and 80% mRCA stenosis.  Patient had Taxus DES to both vessels.  In 4/22, noted to have EF down to 20-25% on echo, set up for coronary angiography. LHC 4/22 showed high-grade subtotal stenoses of the LAD, left circumflex, and total occlusion of the previously placed stents in the circumflex marginal and RCA.  CMRI with substantial viability.  03/10/21 patient had CABG with LIMA-LAD, SVG-OM, SVG-PDA.  No chest pain.   - Continue ASA 81  - Continue atorvastatin 80 daily, check lipids today.    2. Chronic systolic CHF:  Ischemic cardiomyopathy.  Echo in 4/22  with EF 20-25%, severe LV dilation, mildly dilated RV with normal systolic function, moderate MR.  RHC 4/22 with elevated PCWP and CI 1.7.  Patient had CABG, has been doing well since.  Echo in 10/22 with EF 30%, echo today showed EF 25-30%, mild LV dilation, septal-lateral dyssynchrony, mild RV dysfunction.  NYHA class I symptoms.  He is not volume overloaded, weight down 2 lbs.  -  Can continue to use Lasix prn.  - Continue Farxiga 10 mg daily.   - Continue Toprol XL 50 mg daily.    - continue digoxin 0.125, check level today.  - He is only taking Entresto 24/26 once daily.  Increase to bid.   - Off spironolactone with painful gynecomastia.  I will have him start on eplerenone 25 mg daily, BMET today and in 10 days.  - EF 25-30% on today's echo with LBBB-like IVCD 142 msec on today's ECG.  He qualifies for ICD, possibly CRT-D (borderline).  He has seen EP and device was recommended.  He is still not sure he wants one.  I recommended a CRT-D device again today, he will think about it. .   3. CKD stage 3: BMET today.  4. Hyperlipidemia: Atorvastatin 80 daily, check lipids today.   Followup in 3 wks with HF pharmacist for 2 visits, then followup APP 3 months.  Call if you decide to have ICD placed.   Loralie Champagne 09/10/2022

## 2022-09-21 ENCOUNTER — Telehealth (HOSPITAL_COMMUNITY): Payer: Self-pay | Admitting: Cardiology

## 2022-09-21 ENCOUNTER — Ambulatory Visit (HOSPITAL_COMMUNITY)
Admission: RE | Admit: 2022-09-21 | Discharge: 2022-09-21 | Disposition: A | Discharge status: Home / Self Care | Payer: Medicare Other | Source: Ambulatory Visit | Attending: Cardiology | Admitting: Cardiology

## 2022-09-21 DIAGNOSIS — I5022 Chronic systolic (congestive) heart failure: Secondary | ICD-10-CM | POA: Diagnosis not present

## 2022-09-21 LAB — BASIC METABOLIC PANEL
Anion gap: 4 — ABNORMAL LOW (ref 5–15)
BUN: 20 mg/dL (ref 8–23)
CO2: 26 mmol/L (ref 22–32)
Calcium: 9.3 mg/dL (ref 8.9–10.3)
Chloride: 110 mmol/L (ref 98–111)
Creatinine, Ser: 1.77 mg/dL — ABNORMAL HIGH (ref 0.61–1.24)
GFR, Estimated: 42 mL/min — ABNORMAL LOW (ref >60)
Glucose, Bld: 76 mg/dL (ref 70–99)
Potassium: 4.7 mmol/L (ref 3.5–5.1)
Sodium: 140 mmol/L (ref 135–145)

## 2022-09-21 NOTE — Telephone Encounter (Signed)
Pt request assistant w/ cost for farxiga, please advise

## 2022-09-24 ENCOUNTER — Telehealth (HOSPITAL_COMMUNITY): Payer: Self-pay | Admitting: Pharmacy Technician

## 2022-09-24 NOTE — Telephone Encounter (Signed)
Advanced Heart Failure Patient Advocate Encounter  Called and spoke with patient. Michela Pitcher he was being charged $45 for [place]. Asked if he was still covered under BCBS state plan, patient stated he was. Sounds like the pharmacy was using the [facility] Medicare plan instead of the [facility]. Having the pharmacy switch back to [facility] and use the copay card should resolve this situation.  Patient appreciative of assistance. Advised him to call back with issues.   Charlann Boxer, CPhT

## 2022-09-29 ENCOUNTER — Ambulatory Visit
Admission: RE | Admit: 2022-09-29 | Discharge: 2022-09-29 | Disposition: A | Discharge status: Home / Self Care | Payer: Medicare Other | Source: Ambulatory Visit | Attending: Emergency Medicine | Admitting: Emergency Medicine

## 2022-09-29 VITALS — BP 120/82 | HR 70 | Temp 99.6°F | Resp 19 | Ht 70.0 in | Wt 208.0 lb

## 2022-09-29 DIAGNOSIS — R059 Cough, unspecified: Secondary | ICD-10-CM | POA: Diagnosis not present

## 2022-09-29 DIAGNOSIS — J069 Acute upper respiratory infection, unspecified: Secondary | ICD-10-CM

## 2022-09-29 DIAGNOSIS — J101 Influenza due to other identified influenza virus with other respiratory manifestations: Secondary | ICD-10-CM | POA: Insufficient documentation

## 2022-09-29 DIAGNOSIS — Z1152 Encounter for screening for COVID-19: Secondary | ICD-10-CM | POA: Insufficient documentation

## 2022-09-29 DIAGNOSIS — Z79899 Other long term (current) drug therapy: Secondary | ICD-10-CM | POA: Diagnosis not present

## 2022-09-29 LAB — RESP PANEL BY RT-PCR (FLU A&B, COVID) ARPGX2
Influenza A by PCR: POSITIVE — AB
Influenza B by PCR: NEGATIVE
SARS Coronavirus 2 by RT PCR: NEGATIVE

## 2022-09-29 MED ORDER — PROMETHAZINE-DM 6.25-15 MG/5ML PO SYRP: 5.0000 mL | ORAL_SOLUTION | Freq: Four times a day (QID) | ORAL | 0 refills | Status: DC | PRN

## 2022-09-29 NOTE — Discharge Instructions (Addendum)
Your COVID and Flu tests are pending.    Take the promethazine DM as directed for cough. Do not drive, operate machinery, drink alcohol, or perform dangerous activities with this medication as it may cause drowsiness.   Take Tylenol or ibuprofen as needed for fever or discomfort.  Rest and keep yourself hydrated.    Follow-up with your primary care provider if your symptoms are not improving.

## 2022-09-29 NOTE — ED Provider Notes (Signed)
Roderic Palau    CSN: [id number] Arrival date & time: 09/29/22  0931      History   Chief Complaint Chief Complaint  Patient presents with   Cough    Chest cough overall sick - Entered by patient    HPI Kahmari Koller is a 66 y.o. male.  Patient presents with 3-day history of fever, headache, sore throat, congestion, runny nose, nonproductive cough.  Tmax 101.  He denies chest pain, shortness of breath, vomiting, diarrhea, or other symptoms.  Treatment at home with Alka-Seltzer plus and Motrin.  Negative COVID test at home.  His medical history includes cardiomyopathy, CAD, STEMI, heart failure, thrombocytopenia, CKD.  The history is provided by the patient and medical records.    Past Medical History:  Diagnosis Date   CAD (coronary artery disease)    a. STEMI 2004 with occluded Cx and 80% RCA s/p DESx2. b. CABG 03/2021.   Chronic systolic CHF (congestive heart failure) (HCC)    CKD (chronic kidney disease), stage III (HCC)    HCV antibody positive    previous eval at [facility] hepatology clinic   Ischemic cardiomyopathy    STEMI (ST elevation myocardial infarction) ([place]) 2004   with totally occluded CFX (infarct related artery) and 80% mRCA stenosis. Taxus DES to both vessles. EF was 50-55% by LV-gram   Thrombocytopenia [facility])     Patient Active Problem List   Diagnosis Date Noted   Chronic systolic heart failure ([place]) 09/22/2021   CKD (chronic kidney disease) stage 3, GFR 30-59 ml/min (HCC) 09/22/2021   Ischemic cardiomyopathy    Thrombocytopenia ([place]) 10/05/2020   Episodic lightheadedness 03/01/2020   Paresthesia 01/27/2020   Left knee pain 02/05/2019   Elevated PSA 09/22/2017   Hyperglycemia 10/13/2014   Hyperlipidemia 10/13/2014   Coronary artery disease involving native coronary artery of native heart 10/10/2014   Welcome to Medicare preventive visit 10/10/2014   Advance care planning 10/10/2014   HCV antibody positive 04/30/2008    Past Surgical  History:  Procedure Laterality Date   CORONARY ANGIOPLASTY WITH STENT PLACEMENT  08/22/03   CORONARY ARTERY BYPASS GRAFT N/A 03/22/2021   Procedure: CORONARY ARTERY BYPASS GRAFTING (CABG) X  THREE USING LEFT INTERNAL MAMMARY ARTERY AND RIGHT ENDOSCOPIC SAPHENOUS VEIN HARVEST CONDUITS;  Surgeon: Lajuana Matte, MD;  Location: [facility];  Service: Open Heart Surgery;  Laterality: N/A;   RIGHT/LEFT HEART CATH AND CORONARY ANGIOGRAPHY N/A 03/20/2021   Procedure: RIGHT/LEFT HEART CATH AND CORONARY ANGIOGRAPHY;  Surgeon: Troy Sine, MD;  Location: [facility] CV [facility];  Service: Cardiovascular;  Laterality: N/A;   TEE WITHOUT CARDIOVERSION N/A 03/22/2021   Procedure: TRANSESOPHAGEAL ECHOCARDIOGRAM (TEE);  Surgeon: Lajuana Matte, MD;  Location: [facility];  Service: Open Heart Surgery;  Laterality: N/A;       Home Medications    Prior to Admission medications   Medication Sig Start Date End Date Taking? Authorizing Provider  promethazine-dextromethorphan (PROMETHAZINE-DM) 6.25-15 MG/5ML syrup Take 5 mLs by mouth 4 (four) times daily as needed for cough. 09/29/22  Yes Sharion Balloon, NP  aspirin 81 MG EC tablet Take 1 tablet (81 mg total) by mouth daily. 09/22/21   Jerline Pain, MD  atorvastatin (LIPITOR) 80 MG tablet Take 1 tablet by mouth once daily 08/07/22   Larey Dresser, MD  digoxin Fonnie Birkenhead) 0.125 MG tablet Take 1 tablet by mouth once daily 08/23/22   Larey Dresser, MD  eplerenone (INSPRA) 25 MG tablet Take 1 tablet (25  mg total) by mouth daily. 09/10/22   Laurey Morale, MD  FARXIGA 10 MG TABS tablet TAKE 1 TABLET BY MOUTH ONCE DAILY BEFORE BREAKFAST 08/23/22   Laurey Morale, MD  furosemide (LASIX) 20 MG tablet Take 20 mg by mouth as needed.    [provider]  loratadine (CLARITIN) 10 MG tablet Take 10 mg by mouth daily.    [provider]  metoprolol succinate (TOPROL-XL) 50 MG 24 hr tablet Take 1 tablet (50 mg total) by mouth daily. 12/06/21   Laurey Morale, MD  Multiple Vitamin (MULTIVITAMIN WITH MINERALS) TABS tablet Take 1 tablet by mouth daily.    [provider]  sacubitril-valsartan (ENTRESTO) 24-26 MG Take 1 tablet by mouth 2 (two) times daily. 09/10/22   Laurey Morale, MD    Family History Family History  Problem Relation Age of Onset   Aneurysm Father        brain   Stroke Father    Other Mother        ASCVD   Hypertension Mother    Heart attack Other        uncle   Diabetes Child    Diabetes Child    Colon cancer Neg Hx    Prostate cancer Neg Hx     Social History Social History   Tobacco Use   Smoking status: Former    Packs/day: 1.00    Years: 10.00    Total pack years: 10.00    Types: Cigarettes    Quit date: 12/11/2003    Years since quitting: 18.8   Smokeless tobacco: Never  Vaping Use   Vaping Use: Never used  Substance Use Topics   Alcohol use: No   Drug use: No     Allergies   Patient has no known allergies.   Review of Systems Review of Systems  Constitutional:  Positive for fever. Negative for chills.  HENT:  Positive for congestion, postnasal drip, rhinorrhea and sore throat. Negative for ear pain.   Respiratory:  Positive for cough. Negative for shortness of breath.   Cardiovascular:  Negative for chest pain and palpitations.  Gastrointestinal:  Negative for diarrhea and vomiting.  Skin:  Negative for rash.  Neurological:  Positive for headaches.  All other systems reviewed and are negative.    Physical Exam Triage Vital Signs ED Triage Vitals  Enc Vitals Group     BP      Pulse      Resp      Temp      Temp src      SpO2      Weight      Height      Head Circumference      Peak Flow      Pain Score      Pain Loc      Pain Edu?      Excl. in GC?    No data found.  Updated Vital Signs BP 120/82   Pulse 70   Temp 99.6 F (37.6 C)   Resp 19   Ht 5\' 10"  (1.778 m)   Wt 208 lb (94.3 kg)   SpO2 98%   BMI 29.84 kg/m   Visual Acuity Right Eye Distance:    Left Eye Distance:   Bilateral Distance:    Right Eye Near:   Left Eye Near:    Bilateral Near:     Physical Exam Vitals and nursing note reviewed.  Constitutional:  General: He is not in acute distress.    Appearance: Normal appearance. He is well-developed. He is not ill-appearing.  HENT:     Right Ear: Tympanic membrane normal.     Left Ear: Tympanic membrane normal.     Nose: Nose normal.     Mouth/Throat:     Mouth: Mucous membranes are moist.     Pharynx: Oropharynx is clear.  Cardiovascular:     Rate and Rhythm: Normal rate and regular rhythm.     Heart sounds: Normal heart sounds.  Pulmonary:     Effort: Pulmonary effort is normal. No respiratory distress.     Breath sounds: Normal breath sounds.  Musculoskeletal:     Cervical back: Neck supple.  Skin:    General: Skin is warm and dry.  Neurological:     Mental Status: He is alert.  Psychiatric:        Mood and Affect: Mood normal.        Behavior: Behavior normal.      UC Treatments / Results  Labs (all labs ordered are listed, but only abnormal results are displayed) Labs Reviewed  RESP PANEL BY RT-PCR (FLU A&B, COVID) [id number]    EKG   Radiology No results found.  Procedures Procedures (including critical care time)  Medications Ordered in UC Medications - No data to display  Initial Impression / Assessment and Plan / UC Course  I have reviewed the triage vital signs and the nursing notes.  Pertinent labs & imaging results that were available during my care of the patient were reviewed by me and considered in my medical decision making (see chart for details).    Viral URI with cough.  COVID and Flu pending.  Treating cough with Promethazine DM; precautions for drowsiness with this medication discussed.  Discussed symptomatic treatment including Tylenol or ibuprofen, rest, hydration.  Instructed patient to follow up with PCP if symptoms are not improving.  He agrees to plan of  care.   Final Clinical Impressions(s) / UC Diagnoses   Final diagnoses:  Viral URI with cough     Discharge Instructions      Your COVID and Flu tests are pending.    Take the promethazine DM as directed for cough. Do not drive, operate machinery, drink alcohol, or perform dangerous activities with this medication as it may cause drowsiness.   Take Tylenol or ibuprofen as needed for fever or discomfort.  Rest and keep yourself hydrated.    Follow-up with your primary care provider if your symptoms are not improving.         ED Prescriptions     Medication Sig Dispense Auth. Provider   promethazine-dextromethorphan (PROMETHAZINE-DM) 6.25-15 MG/5ML syrup Take 5 mLs by mouth 4 (four) times daily as needed for cough. 118 mL Mickie Bail, NP      PDMP not reviewed this encounter.   Mickie Bail, NP 09/29/22 1010

## 2022-09-29 NOTE — ED Triage Notes (Addendum)
Patient to Urgent Care with complaints of cough and congestion that started on Wednesday. Describes cough is dry. Also reports some fevers and headaches.   Has been taking motrin and alka seltzer. Negative covid test at home.

## 2022-10-01 ENCOUNTER — Inpatient Hospital Stay (HOSPITAL_COMMUNITY): Admission: RE | Admit: 2022-10-01 | Payer: Medicare Other | Source: Ambulatory Visit

## 2022-10-01 NOTE — Progress Notes (Signed)
Advanced Heart Failure Clinic Note   PCP: Dr Para March  Primary Cardiologist: Dr Shirlee Latch  CT Surgeon: Dr Cliffton Asters.     HPI:  Mr Dyar is a 66 y.o. with a history of  CAD, MI 2004 DES x2 totally occluded CFX (infarct-related artery) and 80% mRCA stenosis with taxus DES to both vessels, hyperlipidemia. Echo in 2011 showed EF 40-45%. He does not drink alcohol. Retired from [employer].    Admitted 03/20/21 for scheduled cath. Cath showed multivessel, PCWP 30, and cardiac index 1.7. Echo showed EF 20-25%.  CT surgery consulted. Prior to surgery diuresed with IV Lasix.  Had CABG x3. Post op course complicated by AKI. Drips slowly weaned off. GDMT limited by AKI.    Echo in 09/2021 showed EF 30%, mild LV dilation/mild LVH, mildly decreased RV systolic function. Echo was done 09/2022 and reviewed, EF 25-30%, mild LV dilation with septal-lateral dyssynchrony, mildly decreased RV systolic function, normal IVC.    Returned to [facility] for HF follow up 09/10/22.  Patient was doing well symptomatically.  He is retired and reported that he goes to the gym several days/week.  He lifts weights and walks on the treadmill.  No exertional dyspnea or chest pain. No orthopnea/PND.  No lightheadedness.   Today he returns to HF clinic for pharmacist medication titration. At last visit with MD Sherryll Burger 24/26 mg was increased from once daily to BID. Additionally, eplerenone 25 mg daily was initiated (off spironolactone with gynecomastia). Overall he is feeling well today. No dizziness, lightheadedness, CP or palpitations. No SOB/DOE. Weight at home has been stable, 208 lbs. He has not needed any PRN Lasix. No LEE, PND or orthopnea. Taking all medications as prescribed and tolerating all medications.     HF Medications: Metoprolol succinate 50 mg daily Entresto 24/26 mg BID Eplerenone 25 mg daily Farxiga 10 mg daily Digoxin 0.125 mg daily Lasix 20 mg PRN  Has the patient been experiencing any side effects to the  medications prescribed?  no  Does the patient have any problems obtaining medications due to transportation or finances?   No - [employer] Medicare and BCBS [place]. Gets medications through [facility] and uses copay card.   Understanding of regimen: good Understanding of indications: good Potential of compliance: good Patient understands to avoid NSAIDs. Patient understands to avoid decongestants.    Pertinent Lab Values: 09/21/22: Serum creatinine 1.77, BUN 20, Potassium 4.7, Sodium 140, BNP 367.4 pg/mL, Digoxin 0.2 ng/mL (09/10/22)   Vital Signs: Weight: 214.8 lbs (last clinic weight: 214.4 lbs) Blood pressure: 106/66  Heart rate: 66   Assessment/Plan: 1. CAD: Had MI in 2004 with DESx2 with totally occluded CFX (infarct-related artery) and 80% mRCA stenosis.  Patient had Taxus DES to both vessels.  In 03/2021, noted to have EF down to 20-25% on echo, set up for coronary angiography. LHC 03/2021 showed high-grade subtotal stenoses of the LAD, left circumflex, and total occlusion of the previously placed stents in the circumflex marginal and RCA.  CMRI with substantial viability.  03/10/21 patient had CABG with LIMA-LAD, SVG-OM, SVG-PDA.  No chest pain.   - Continue ASA 81 mg daily - Continue atorvastatin 80 mg daily 2. Chronic systolic CHF:  Ischemic cardiomyopathy.  Echo in 03/2021 with EF 20-25%, severe LV dilation, mildly dilated RV with normal systolic function, moderate MR.  RHC 03/2021 with elevated PCWP and CI 1.7.  Patient had CABG, has been doing well since.  Echo in 09/2021 with EF 30%, echo 09/10/22 showed  EF 25-30%, mild LV dilation, septal-lateral dyssynchrony, mild RV dysfunction.   - NYHA class I symptoms.  Euvolemic on exam.  - Can continue to use Lasix PRN.  - Continue metoprolol XL 50 mg daily.   - Continue Entresto 24/26 mg BID    - Increase eplerenone to 50 mg daily. Off spironolactone with painful gynecomastia. Repeat BMET in 1 week.  - Continue Farxiga 10 mg daily.   - Continue  digoxin 0.125 mg daily.  - EF 25-30% on 09/2022 echo with LBBB-like IVCD 142 msec on ECG.  He qualifies for ICD, possibly CRT-D (borderline).  He has seen EP and device was recommended.  He is still not sure he wants one.  CRT-D device was recommended again, he will think about it. .   3. CKD stage 3: Scr 1.77 on 09/21/22 BMET 4. Hyperlipidemia: Atorvastatin 80 mg daily  Follow up in 2 months with [place], PharmD, BCPS, [place], [facility] Pharmacist [phone]

## 2022-10-05 ENCOUNTER — Other Ambulatory Visit (HOSPITAL_COMMUNITY): Payer: Self-pay

## 2022-10-05 MED ORDER — METOPROLOL SUCCINATE ER 50 MG PO TB24: 50.0000 mg | ORAL_TABLET | Freq: Every day | ORAL | 3 refills | Status: DC

## 2022-10-10 ENCOUNTER — Ambulatory Visit (HOSPITAL_COMMUNITY)
Admission: RE | Admit: 2022-10-10 | Discharge: 2022-10-10 | Disposition: A | Discharge status: Home / Self Care | Payer: Medicare Other | Source: Ambulatory Visit | Attending: Internal Medicine | Admitting: Internal Medicine

## 2022-10-10 VITALS — BP 106/66 | HR 66 | Wt 214.8 lb

## 2022-10-10 DIAGNOSIS — I447 Left bundle-branch block, unspecified: Secondary | ICD-10-CM | POA: Insufficient documentation

## 2022-10-10 DIAGNOSIS — I5022 Chronic systolic (congestive) heart failure: Secondary | ICD-10-CM | POA: Diagnosis not present

## 2022-10-10 DIAGNOSIS — I2510 Atherosclerotic heart disease of native coronary artery without angina pectoris: Secondary | ICD-10-CM | POA: Diagnosis not present

## 2022-10-10 DIAGNOSIS — N62 Hypertrophy of breast: Secondary | ICD-10-CM | POA: Insufficient documentation

## 2022-10-10 DIAGNOSIS — I252 Old myocardial infarction: Secondary | ICD-10-CM | POA: Diagnosis not present

## 2022-10-10 DIAGNOSIS — Z79899 Other long term (current) drug therapy: Secondary | ICD-10-CM | POA: Diagnosis not present

## 2022-10-10 DIAGNOSIS — N183 Chronic kidney disease, stage 3 unspecified: Secondary | ICD-10-CM | POA: Diagnosis not present

## 2022-10-10 DIAGNOSIS — Z7982 Long term (current) use of aspirin: Secondary | ICD-10-CM | POA: Diagnosis not present

## 2022-10-10 DIAGNOSIS — E785 Hyperlipidemia, unspecified: Secondary | ICD-10-CM | POA: Insufficient documentation

## 2022-10-10 DIAGNOSIS — Z951 Presence of aortocoronary bypass graft: Secondary | ICD-10-CM | POA: Insufficient documentation

## 2022-10-10 DIAGNOSIS — I255 Ischemic cardiomyopathy: Secondary | ICD-10-CM | POA: Insufficient documentation

## 2022-10-10 DIAGNOSIS — Z7984 Long term (current) use of oral hypoglycemic drugs: Secondary | ICD-10-CM | POA: Insufficient documentation

## 2022-10-10 MED ORDER — EPLERENONE 50 MG PO TABS: 50.0000 mg | ORAL_TABLET | Freq: Every day | ORAL | 3 refills | Status: DC

## 2022-10-10 NOTE — Patient Instructions (Signed)
It was a pleasure seeing you today!  MEDICATIONS: -We are changing your medications today -Increase eplerenone to 50 mg (1 tablet) daily. You may take 2 tablets of the 25 mg strength until you pick up the new strength.  -Call if you have questions about your medications.   NEXT APPOINTMENT: Return to clinic in 2 months with [facility].  In general, to take care of your heart failure: -Limit your fluid intake to 2 Liters (half-gallon) per day.   -Limit your salt intake to ideally 2-3 grams (2000-3000 mg) per day. -Weigh yourself daily and record, and bring that "weight diary" to your next appointment.  (Weight gain of 2-3 pounds in 1 day typically means fluid weight.) -The medications for your heart are to help your heart and help you live longer.   -Please contact us before stopping any of your heart medications.  Call the clinic at [phone] with questions or to reschedule future appointments.

## 2022-10-17 ENCOUNTER — Ambulatory Visit (HOSPITAL_COMMUNITY)
Admission: RE | Admit: 2022-10-17 | Discharge: 2022-10-17 | Disposition: A | Discharge status: Home / Self Care | Payer: Medicare Other | Source: Ambulatory Visit | Attending: Cardiology | Admitting: Cardiology

## 2022-10-17 DIAGNOSIS — I5022 Chronic systolic (congestive) heart failure: Secondary | ICD-10-CM | POA: Insufficient documentation

## 2022-10-17 LAB — BASIC METABOLIC PANEL
Anion gap: 7 (ref 5–15)
BUN: 21 mg/dL (ref 8–23)
CO2: 26 mmol/L (ref 22–32)
Calcium: 8.8 mg/dL — ABNORMAL LOW (ref 8.9–10.3)
Chloride: 107 mmol/L (ref 98–111)
Creatinine, Ser: 1.71 mg/dL — ABNORMAL HIGH (ref 0.61–1.24)
GFR, Estimated: 43 mL/min — ABNORMAL LOW (ref >60)
Glucose, Bld: 205 mg/dL — ABNORMAL HIGH (ref 70–99)
Potassium: 4.1 mmol/L (ref 3.5–5.1)
Sodium: 140 mmol/L (ref 135–145)

## 2022-10-27 ENCOUNTER — Other Ambulatory Visit: Payer: Self-pay | Admitting: Physician Assistant

## 2022-11-28 NOTE — Progress Notes (Addendum)
Advanced Heart Failure Clinic Note  PCP: Dr Damita Dunnings  CT Surgeon: Dr Kipp Brood.  HF Cardiologist: Dr. [name]   HPI: Edward [name] is a 66 y.o. with a history of  CAD, MI 2004 DES x2 totally occluded CFX (infarct-related artery) and 80% mRCA stenosis with taxus DES to both vessels, hyperlipidemia. Echo in 2011 showed EF 40-45%. He does not drink alcohol. Retired from [employer].    Admitted 03/20/21 for scheduled cath. Cath showed multivessel, PCWP 30, and cardiac index 1.7. Echo showed EF 20-25%.  CT surgery consulted. Prior to surgery diuresed with IV lasix.  Had CABG x3. Post op course complicated by AKI. Drips slowly weaned off. GDMT limited by AKI.   Echo in 10/22 showed EF 30%, mild LV dilation/mild LVH, mildly decreased RV systolic function. Echo was 10/23 showed EF 25-30%, mild LV dilation with septal-lateral dyssynchrony, mildly decreased RV systolic function, normal IVC.   Today he returns for HF follow up. Overall feeling fine. He does not have shortness of breath with activity. He works out 3-4x/week at [employer], [employer] and [employer]. Denies palpitations, abnormal bleeding,CP, dizziness, edema, or PND/Orthopnea. Appetite ok. No fever or chills. Weight at home 217 pounds. Taking all medications. Still not interest in ICD. Only taking Entresto once a day. Has not taken Lasix in awhile. Drinks > 2L/fluid daily.  ECG (personally reviewed): none ordered today.  Labs (6/22): LDL 50 Labs (7/22): K 4.2, creatinine 1.47 Labs (8/22): K 3.7, creatinine 1.5 Labs (9/22): K 4.4, creatinine 1.66 Labs (6/23): K 3.9, creatinine 1.6 Labs (10/23): LDL 44, HDL 26 Labs (11/23): K 4.1, creatinine 1.71  PMH: 1. CAD:  - MI 2004 with DES to occluded LCx and DES to [facility].  - Lula 4/22 with 3VD.  - CABG (4/22): LIMA-LAD, SVG-OM, SVG-PDA.  2. CKD: Stage 3.  3. H/o HCV 4. Hyperlipidemia 5. Chronic systolic CHF: Ischemic cardiomyopathy.  - Echo (2011): EF 40-45%.  - Echo (4/22): EF 20-25%, normal RV  systolic function.  - RHC (4/22): mean PCWP 30, CI 1.7 - Echo (10/22): EF 30%, mild LV dilation/mild LVH, mildly decreased RV systolic function. - Echo (10/23): EF 25-30%, mild LV dilation with septal-lateral dyssynchrony, mildly decreased RV systolic function, normal IVC.   ROS: All systems negative except as listed in HPI, PMH and Problem List.  Social History   Socioeconomic History   Marital status: Married    Spouse name: Milbern Doescher   Number of children: 3   Years of education: 16   Highest education level: Not on file  Occupational History   Occupation: Retired  Tobacco Use   Smoking status: Former    Packs/day: 1.00    Years: 10.00    Total pack years: 10.00    Types: Cigarettes    Quit date: 12/11/2003    Years since quitting: 19.0   Smokeless tobacco: Never  Vaping Use   Vaping Use: Never used  Substance and Sexual Activity   Alcohol use: No   Drug use: No   Sexual activity: Not on file  Other Topics Concern   Not on file  Social History Narrative   Left handed    Married, 1980   Lives in [place]   3 children   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[place]   Caffeine: 2cups coffee or less per day   Social Determinants of Radio broadcast assistant Strain: Not on file  Food Insecurity: Not  on file  Transportation Needs: Not on file  Physical Activity: Not on file  Stress: Not on file  Social Connections: Not on file  Intimate Partner Violence: Not on file   Family History  Problem Relation Age of Onset   Aneurysm Father        brain   Stroke Father    Other Mother        ASCVD   Hypertension Mother    Heart attack Other        uncle   Diabetes Child    Diabetes Child    Colon cancer Neg Hx    Prostate cancer Neg Hx    Current Outpatient Medications  Medication Sig Dispense Refill   aspirin 81 MG EC tablet Take 1 tablet (81 mg total) by mouth daily.     atorvastatin (LIPITOR) 80 MG tablet Take 1 tablet  by mouth once daily 30 tablet 11   digoxin (LANOXIN) 0.125 MG tablet Take 1 tablet by mouth once daily 90 tablet 3   eplerenone (INSPRA) 50 MG tablet Take 1 tablet (50 mg total) by mouth daily. 90 tablet 3   FARXIGA 10 MG TABS tablet TAKE 1 TABLET BY MOUTH ONCE DAILY BEFORE BREAKFAST 30 tablet 11   loratadine (CLARITIN) 10 MG tablet Take 10 mg by mouth daily.     metoprolol succinate (TOPROL-XL) 50 MG 24 hr tablet Take 1 tablet (50 mg total) by mouth daily. 90 tablet 3   Multiple Vitamin (MULTIVITAMIN WITH MINERALS) TABS tablet Take 1 tablet by mouth daily.     sacubitril-valsartan (ENTRESTO) 24-26 MG Take 1 tablet by mouth 2 (two) times daily. 60 tablet 5   furosemide (LASIX) 20 MG tablet TAKE 1 TABLET BY MOUTH ONCE DAILY FOR 3 DAYS, THEN AS NEEDED FOR SHORTNESS OF BREATH (Patient not taking: Reported on 12/11/2022) 90 tablet 0   No current facility-administered medications for this encounter.   BP 122/76   Pulse 64   Wt 100.2 kg (221 lb)   SpO2 97%   BMI 31.71 kg/m   Wt Readings from Last 3 Encounters:  12/11/22 100.2 kg (221 lb)  10/10/22 97.4 kg (214 lb 12.8 oz)  09/29/22 94.3 kg (208 lb)   PHYSICAL EXAM: General:  NAD. No resp difficulty, walked into clinic HEENT: Normal Neck: Supple. JVP 7-8. Carotids 2+ bilat; no bruits. No lymphadenopathy or thryomegaly appreciated. Cor: PMI nondisplaced. Regular rate & rhythm. No rubs, gallops or murmurs. Lungs: Clear Abdomen: Soft, nontender, nondistended. No hepatosplenomegaly. No bruits or masses. Good bowel sounds. Extremities: No cyanosis, clubbing, rash, edema Neuro: Alert & oriented x 3, cranial nerves grossly intact. Moves all 4 extremities w/o difficulty. Affect pleasant.  ASSESSMENT & PLAN: 1. CAD: Had MI in 2004 with DESx2 with totally occluded CFX (infarct-related artery) and 80% mRCA stenosis.  Patient had Taxus DES to both vessels.  In 4/22, noted to have EF down to 20-25% on echo, set up for coronary angiography. LHC 4/22  showed high-grade subtotal stenoses of the LAD, left circumflex, and total occlusion of the previously placed stents in the circumflex marginal and RCA.  CMRI with substantial viability.  03/10/21 patient had CABG with LIMA-LAD, SVG-OM, SVG-PDA.  No chest pain.   - Continue ASA 81  - Continue atorvastatin 80 daily, good lipids 10/23. 2. Chronic systolic CHF:  Ischemic cardiomyopathy.  Echo in 4/22 with EF 20-25%, severe LV dilation, mildly dilated RV with normal systolic function, moderate Edward.  RHC 4/22 with elevated PCWP and CI  1.7.  Patient had CABG, has been doing well since.  Echo in 10/22 with EF 30%, echo 10/23 showed EF 25-30%, mild LV dilation, septal-lateral dyssynchrony, mild RV dysfunction.  NYHA class I symptoms.  He is mildly volume overloaded on exam, weight up 9 lbs on home scale. - Take Lasix 40 mg daily x 3 days, the  PRN. BMET and BNP today. - Discussed limiting fluids to < 2L/day. - Take Entresto 24/26 mg bid, stressed need to take twice a day. Recommend he set alarm on phone. - Continue Farxiga 10 mg daily.   - Continue Toprol XL 50 mg daily.    - Continue digoxin 0.125, check level today.  - Continue eplerenone 50 mg daily (painful gynecomastia with spiro). - EF 25-30% on recent echo with LBBB-like IVCD 142 msec on last ECG.  He qualifies for ICD, possibly CRT-D (borderline).  He has seen EP and device was recommended.  I recommended a CRT-D device again today, he will think about it.    3. CKD stage 3: BMET today.  4. Hyperlipidemia: Atorvastatin 80 daily, good lipids 10/23.  Follow up in 3-4 weeks with APP to check fluid and 3-4 months with Dr. Shirlee Latch  Call if you decide to have ICD placed.   Anderson Malta [facility] FNP-BC 12/11/2022

## 2022-12-04 NOTE — Progress Notes (Signed)
[facility] Quality Team Note  Name: Edward [name] Date of Birth: Apr 23, 1956 MRN: [id number] Date: 12/04/2022  [facility] Quality Team has reviewed this patient's chart, please see recommendations below:  [facility] Quality Other; Pt has open quality gap for KED, needs Micro/Creat Urine test to close this. Please address at next visit.

## 2022-12-11 ENCOUNTER — Encounter (HOSPITAL_COMMUNITY): Payer: Self-pay

## 2022-12-11 ENCOUNTER — Ambulatory Visit (HOSPITAL_COMMUNITY)
Admission: RE | Admit: 2022-12-11 | Discharge: 2022-12-11 | Disposition: A | Discharge status: Home / Self Care | Payer: Medicare Other | Source: Ambulatory Visit | Attending: Family Medicine | Admitting: Family Medicine

## 2022-12-11 VITALS — BP 122/76 | HR 64 | Wt 221.0 lb

## 2022-12-11 DIAGNOSIS — N1831 Chronic kidney disease, stage 3a: Secondary | ICD-10-CM | POA: Diagnosis not present

## 2022-12-11 DIAGNOSIS — Z951 Presence of aortocoronary bypass graft: Secondary | ICD-10-CM | POA: Diagnosis not present

## 2022-12-11 DIAGNOSIS — N183 Chronic kidney disease, stage 3 unspecified: Secondary | ICD-10-CM | POA: Insufficient documentation

## 2022-12-11 DIAGNOSIS — Z79899 Other long term (current) drug therapy: Secondary | ICD-10-CM | POA: Diagnosis not present

## 2022-12-11 DIAGNOSIS — Z955 Presence of coronary angioplasty implant and graft: Secondary | ICD-10-CM | POA: Insufficient documentation

## 2022-12-11 DIAGNOSIS — Z7984 Long term (current) use of oral hypoglycemic drugs: Secondary | ICD-10-CM | POA: Diagnosis not present

## 2022-12-11 DIAGNOSIS — I5022 Chronic systolic (congestive) heart failure: Secondary | ICD-10-CM

## 2022-12-11 DIAGNOSIS — E785 Hyperlipidemia, unspecified: Secondary | ICD-10-CM | POA: Diagnosis not present

## 2022-12-11 DIAGNOSIS — I2510 Atherosclerotic heart disease of native coronary artery without angina pectoris: Secondary | ICD-10-CM

## 2022-12-11 DIAGNOSIS — I252 Old myocardial infarction: Secondary | ICD-10-CM | POA: Diagnosis not present

## 2022-12-11 LAB — BASIC METABOLIC PANEL
Anion gap: 7 (ref 5–15)
BUN: 19 mg/dL (ref 8–23)
CO2: 25 mmol/L (ref 22–32)
Calcium: 9.2 mg/dL (ref 8.9–10.3)
Chloride: 106 mmol/L (ref 98–111)
Creatinine, Ser: 1.59 mg/dL — ABNORMAL HIGH (ref 0.61–1.24)
GFR, Estimated: 47 mL/min — ABNORMAL LOW (ref >60)
Glucose, Bld: 108 mg/dL — ABNORMAL HIGH (ref 70–99)
Potassium: 4.4 mmol/L (ref 3.5–5.1)
Sodium: 138 mmol/L (ref 135–145)

## 2022-12-11 LAB — BRAIN NATRIURETIC PEPTIDE: B Natriuretic Peptide: 278 pg/mL — ABNORMAL HIGH (ref 0.0–100.0)

## 2022-12-11 LAB — DIGOXIN LEVEL: Digoxin Level: 0.4 ng/mL — ABNORMAL LOW (ref 0.8–2.0)

## 2022-12-11 MED ORDER — ENTRESTO 24-26 MG PO TABS: 1.0000 | ORAL_TABLET | Freq: Two times a day (BID) | ORAL | 8 refills | Status: DC

## 2022-12-11 NOTE — Addendum Note (Signed)
Encounter addended by: Rafael Bihari, FNP on: 12/11/2022 1:25 PM  Actions taken: Clinical Note Signed

## 2022-12-11 NOTE — Patient Instructions (Addendum)
Thank you for coming in today  Labs were done today, if any labs are abnormal the clinic will call you No news is good news  Take as needed lasix for 3 days   PLEASE Take Entresto 1 tablet twice daily   Your physician recommends that you schedule a follow-up appointment in: 4 weeks in clinic  Your physician recommends that you schedule a follow-up appointment in:  3-4 months with Dr. [name]     Do the following things EVERYDAY: Weigh yourself in the morning before breakfast. Write it down and keep it in a log. Take your medicines as prescribed Eat low salt foods--Limit salt (sodium) to 2000 mg per day.  Stay as active as you can everyday Limit all fluids for the day to less than 2 liters    At the [facility], you and your health needs are our priority. As part of our continuing mission to provide you with exceptional heart care, we have created designated Provider Care Teams. These Care Teams include your primary Cardiologist (physician) and Advanced Practice Providers (APPs- Physician Assistants and Nurse Practitioners) who all work together to provide you with the care you need, when you need it.   You may see any of the following providers on your designated Care Team at your next follow up: Dr Glori Bickers Dr Loralie Champagne Dr. Roxana Hires, NP Lyda Jester, [place] [facility] [place], [place] Forestine Na, NP Audry Riles, PharmD   Please be sure to bring in all your medications bottles to every appointment.   If you have any questions or concerns before your next appointment please send [place] a message through [place] or call our office at [phone].    TO LEAVE A MESSAGE FOR THE NURSE SELECT OPTION 2, PLEASE LEAVE A MESSAGE INCLUDING: YOUR NAME DATE OF BIRTH CALL BACK NUMBER REASON FOR CALL**this is important as we prioritize the call backs  YOU WILL RECEIVE A CALL BACK THE SAME DAY AS LONG AS YOU CALL BEFORE 4:00  PM .

## 2023-01-04 NOTE — Progress Notes (Signed)
Advanced Heart Failure Clinic Note  PCP: Dr Damita Dunnings  CT Surgeon: Dr Kipp Brood.  HF Cardiologist: Dr. [name]   HPI: Mr Addo is a 67 y.o. with a history of  CAD, MI 2004 DES x2 totally occluded CFX (infarct-related artery) and 80% mRCA stenosis with taxus DES to both vessels, hyperlipidemia. Echo in 2011 showed EF 40-45%. He does not drink alcohol. Retired from [employer].    Admitted 03/20/21 for scheduled cath. Cath showed multivessel, PCWP 30, and cardiac index 1.7. Echo showed EF 20-25%.  CT surgery consulted. Prior to surgery diuresed with IV lasix.  Had CABG x3. Post op course complicated by AKI. Drips slowly weaned off. GDMT limited by AKI.   Echo in 10/22 showed EF 30%, mild LV dilation/mild LVH, mildly decreased RV systolic function. Echo was 10/23 showed EF 25-30%, mild LV dilation with septal-lateral dyssynchrony, mildly decreased RV systolic function, normal IVC.   Today he returns for HF follow up. Overall feeling fine. He does not have shortness of breath with activity. He works out 3-4x/week at [employer], [employer] and [employer]. Denies palpitations, abnormal bleeding,CP, dizziness, edema, or PND/Orthopnea. Appetite ok. No fever or chills. Weight at home 217 pounds. Taking all medications. Still not interest in ICD. Only taking Entresto once a day. Has not taken Lasix in awhile. Drinks > 2L/fluid daily.  ECG (personally reviewed): none ordered today.  Labs (6/22): LDL 50 Labs (7/22): K 4.2, creatinine 1.47 Labs (8/22): K 3.7, creatinine 1.5 Labs (9/22): K 4.4, creatinine 1.66 Labs (6/23): K 3.9, creatinine 1.6 Labs (10/23): LDL 44, HDL 26 Labs (11/23): K 4.1, creatinine 1.71  PMH: 1. CAD:  - MI 2004 with DES to occluded LCx and DES to [facility].  - Lula 4/22 with 3VD.  - CABG (4/22): LIMA-LAD, SVG-OM, SVG-PDA.  2. CKD: Stage 3.  3. H/o HCV 4. Hyperlipidemia 5. Chronic systolic CHF: Ischemic cardiomyopathy.  - Echo (2011): EF 40-45%.  - Echo (4/22): EF 20-25%, normal RV  systolic function.  - RHC (4/22): mean PCWP 30, CI 1.7 - Echo (10/22): EF 30%, mild LV dilation/mild LVH, mildly decreased RV systolic function. - Echo (10/23): EF 25-30%, mild LV dilation with septal-lateral dyssynchrony, mildly decreased RV systolic function, normal IVC.   ROS: All systems negative except as listed in HPI, PMH and Problem List.  Social History   Socioeconomic History   Marital status: Married    Spouse name: Milbern Doescher   Number of children: 3   Years of education: 16   Highest education level: Not on file  Occupational History   Occupation: Retired  Tobacco Use   Smoking status: Former    Packs/day: 1.00    Years: 10.00    Total pack years: 10.00    Types: Cigarettes    Quit date: 12/11/2003    Years since quitting: 19.0   Smokeless tobacco: Never  Vaping Use   Vaping Use: Never used  Substance and Sexual Activity   Alcohol use: No   Drug use: No   Sexual activity: Not on file  Other Topics Concern   Not on file  Social History Narrative   Left handed    Married, 1980   Lives in [place]   3 children   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[place]   Caffeine: 2cups coffee or less per day   Social Determinants of Radio broadcast assistant Strain: Not on file  Food Insecurity: Not  on file  Transportation Needs: Not on file  Physical Activity: Not on file  Stress: Not on file  Social Connections: Not on file  Intimate Partner Violence: Not on file   Family History  Problem Relation Age of Onset   Aneurysm Father        brain   Stroke Father    Other Mother        ASCVD   Hypertension Mother    Heart attack Other        uncle   Diabetes Child    Diabetes Child    Colon cancer Neg Hx    Prostate cancer Neg Hx    Current Outpatient Medications  Medication Sig Dispense Refill   aspirin 81 MG EC tablet Take 1 tablet (81 mg total) by mouth daily.     atorvastatin (LIPITOR) 80 MG tablet Take 1 tablet  by mouth once daily 30 tablet 11   digoxin (LANOXIN) 0.125 MG tablet Take 1 tablet by mouth once daily 90 tablet 3   eplerenone (INSPRA) 50 MG tablet Take 1 tablet (50 mg total) by mouth daily. 90 tablet 3   FARXIGA 10 MG TABS tablet TAKE 1 TABLET BY MOUTH ONCE DAILY BEFORE BREAKFAST 30 tablet 11   furosemide (LASIX) 20 MG tablet TAKE 1 TABLET BY MOUTH ONCE DAILY FOR 3 DAYS, THEN AS NEEDED FOR SHORTNESS OF BREATH (Patient not taking: Reported on 12/11/2022) 90 tablet 0   loratadine (CLARITIN) 10 MG tablet Take 10 mg by mouth daily.     metoprolol succinate (TOPROL-XL) 50 MG 24 hr tablet Take 1 tablet (50 mg total) by mouth daily. 90 tablet 3   Multiple Vitamin (MULTIVITAMIN WITH MINERALS) TABS tablet Take 1 tablet by mouth daily.     sacubitril-valsartan (ENTRESTO) 24-26 MG Take 1 tablet by mouth 2 (two) times daily. 60 tablet 8   No current facility-administered medications for this visit.   There were no vitals taken for this visit.  Wt Readings from Last 3 Encounters:  12/11/22 100.2 kg (221 lb)  10/10/22 97.4 kg (214 lb 12.8 oz)  09/29/22 94.3 kg (208 lb)   PHYSICAL EXAM: General:  NAD. No resp difficulty, walked into clinic HEENT: Normal Neck: Supple. JVP 7-8. Carotids 2+ bilat; no bruits. No lymphadenopathy or thryomegaly appreciated. Cor: PMI nondisplaced. Regular rate & rhythm. No rubs, gallops or murmurs. Lungs: Clear Abdomen: Soft, nontender, nondistended. No hepatosplenomegaly. No bruits or masses. Good bowel sounds. Extremities: No cyanosis, clubbing, rash, edema Neuro: Alert & oriented x 3, cranial nerves grossly intact. Moves all 4 extremities w/o difficulty. Affect pleasant.  ASSESSMENT & PLAN: 1. CAD: Had MI in 2004 with DESx2 with totally occluded CFX (infarct-related artery) and 80% mRCA stenosis.  Patient had Taxus DES to both vessels.  In 4/22, noted to have EF down to 20-25% on echo, set up for coronary angiography. LHC 4/22 showed high-grade subtotal stenoses of the  LAD, left circumflex, and total occlusion of the previously placed stents in the circumflex marginal and RCA.  CMRI with substantial viability.  03/10/21 patient had CABG with LIMA-LAD, SVG-OM, SVG-PDA.  No chest pain.   - Continue ASA 81  - Continue atorvastatin 80 daily, good lipids 10/23. 2. Chronic systolic CHF:  Ischemic cardiomyopathy.  Echo in 4/22 with EF 20-25%, severe LV dilation, mildly dilated RV with normal systolic function, moderate MR.  RHC 4/22 with elevated PCWP and CI 1.7.  Patient had CABG, has been doing well since.  Echo in 10/22 with  EF 30%, echo 10/23 showed EF 25-30%, mild LV dilation, septal-lateral dyssynchrony, mild RV dysfunction.  NYHA class I symptoms.  He is mildly volume overloaded on exam, weight up 9 lbs on home scale. - Take Lasix 40 mg daily x 3 days, the  PRN. BMET and BNP today. - Discussed limiting fluids to < 2L/day. - Take Entresto 24/26 mg bid, stressed need to take twice a day. Recommend he set alarm on phone. - Continue Farxiga 10 mg daily.   - Continue Toprol XL 50 mg daily.    - Continue digoxin 0.125, check level today.  - Continue eplerenone 50 mg daily (painful gynecomastia with spiro). - EF 25-30% on recent echo with LBBB-like IVCD 142 msec on last ECG.  He qualifies for ICD, possibly CRT-D (borderline).  He has seen EP and device was recommended.  I recommended a CRT-D device again today, he will think about it.    3. CKD stage 3: BMET today.  4. Hyperlipidemia: Atorvastatin 80 daily, good lipids 10/23.  Follow up in 3-4 weeks with APP to check fluid and 3-4 months with Dr. Shirlee Latch  Call if you decide to have ICD placed.   Anderson Malta [facility] FNP-BC 01/04/2023

## 2023-01-08 ENCOUNTER — Ambulatory Visit (HOSPITAL_COMMUNITY)
Admission: RE | Admit: 2023-01-08 | Discharge: 2023-01-08 | Disposition: A | Discharge status: Home / Self Care | Payer: Medicare Other | Source: Ambulatory Visit | Attending: Family Medicine | Admitting: Family Medicine

## 2023-01-08 ENCOUNTER — Encounter (HOSPITAL_COMMUNITY): Payer: Self-pay

## 2023-01-08 VITALS — BP 100/70 | HR 68 | Wt 222.0 lb

## 2023-01-08 DIAGNOSIS — I5022 Chronic systolic (congestive) heart failure: Secondary | ICD-10-CM | POA: Diagnosis not present

## 2023-01-08 DIAGNOSIS — N183 Chronic kidney disease, stage 3 unspecified: Secondary | ICD-10-CM | POA: Insufficient documentation

## 2023-01-08 DIAGNOSIS — Z955 Presence of coronary angioplasty implant and graft: Secondary | ICD-10-CM | POA: Insufficient documentation

## 2023-01-08 DIAGNOSIS — Z7982 Long term (current) use of aspirin: Secondary | ICD-10-CM | POA: Insufficient documentation

## 2023-01-08 DIAGNOSIS — Z79899 Other long term (current) drug therapy: Secondary | ICD-10-CM | POA: Insufficient documentation

## 2023-01-08 DIAGNOSIS — Z87891 Personal history of nicotine dependence: Secondary | ICD-10-CM | POA: Diagnosis not present

## 2023-01-08 DIAGNOSIS — Z951 Presence of aortocoronary bypass graft: Secondary | ICD-10-CM | POA: Diagnosis not present

## 2023-01-08 DIAGNOSIS — I252 Old myocardial infarction: Secondary | ICD-10-CM | POA: Diagnosis not present

## 2023-01-08 DIAGNOSIS — I2510 Atherosclerotic heart disease of native coronary artery without angina pectoris: Secondary | ICD-10-CM

## 2023-01-08 DIAGNOSIS — E785 Hyperlipidemia, unspecified: Secondary | ICD-10-CM | POA: Insufficient documentation

## 2023-01-08 DIAGNOSIS — N1831 Chronic kidney disease, stage 3a: Secondary | ICD-10-CM

## 2023-01-08 DIAGNOSIS — I255 Ischemic cardiomyopathy: Secondary | ICD-10-CM | POA: Diagnosis not present

## 2023-01-08 LAB — BRAIN NATRIURETIC PEPTIDE: B Natriuretic Peptide: 363.4 pg/mL — ABNORMAL HIGH (ref 0.0–100.0)

## 2023-01-08 LAB — BASIC METABOLIC PANEL
Anion gap: 9 (ref 5–15)
BUN: 19 mg/dL (ref 8–23)
CO2: 26 mmol/L (ref 22–32)
Calcium: 8.9 mg/dL (ref 8.9–10.3)
Chloride: 107 mmol/L (ref 98–111)
Creatinine, Ser: 1.59 mg/dL — ABNORMAL HIGH (ref 0.61–1.24)
GFR, Estimated: 47 mL/min — ABNORMAL LOW (ref >60)
Glucose, Bld: 137 mg/dL — ABNORMAL HIGH (ref 70–99)
Potassium: 3.7 mmol/L (ref 3.5–5.1)
Sodium: 142 mmol/L (ref 135–145)

## 2023-01-08 MED ORDER — FUROSEMIDE 20 MG PO TABS: 20.0000 mg | ORAL_TABLET | Freq: Every day | ORAL | 3 refills | Status: DC

## 2023-01-08 NOTE — Patient Instructions (Addendum)
Thank you for coming in today  Labs were done today, if any labs are abnormal the clinic will call you No news is good news  Your physician recommends that you schedule a follow-up appointment in:  3-4 months with Dr. Mclean You will receive a reminder letter in the mail a few months in advance. If you don't receive a letter, please call our office to schedule the follow-up appointment.    Do the following things EVERYDAY: Weigh yourself in the morning before breakfast. Write it down and keep it in a log. Take your medicines as prescribed Eat low salt foods--Limit salt (sodium) to 2000 mg per day.  Stay as active as you can everyday Limit all fluids for the day to less than 2 liters  At the [facility], you and your health needs are our priority. As part of our continuing mission to provide you with exceptional heart care, we have created designated Provider Care Teams. These Care Teams include your primary Cardiologist (physician) and Advanced Practice Providers (APPs- Physician Assistants and Nurse Practitioners) who all work together to provide you with the care you need, when you need it.   You may see any of the following providers on your designated Care Team at your next follow up: Dr Daniel Bensimhon Dr Dalton McLean Dr. Aditya Sabharwal Amy Clegg, NP Brittainy Simmons, PA Jessica Milford,NP Lindsay Finch, PA Alma Diaz, NP Lauren Kemp, PharmD   Please be sure to bring in all your medications bottles to every appointment.    Thank you for choosing [facility]   If you have any questions or concerns before your next appointment please send us a message through mychart or call our office at [phone].    TO LEAVE A MESSAGE FOR THE NURSE SELECT OPTION 2, PLEASE LEAVE A MESSAGE INCLUDING: YOUR NAME DATE OF BIRTH CALL BACK NUMBER REASON FOR CALL**this is important as we prioritize the call backs  YOU WILL  RECEIVE A CALL BACK THE SAME DAY AS LONG AS YOU CALL BEFORE 4:00 PM\  

## 2023-01-10 ENCOUNTER — Telehealth: Payer: Self-pay | Admitting: *Deleted

## 2023-01-10 NOTE — Telephone Encounter (Signed)
Pt called for lab results from hospital. Questioning elevated BNP. Answered questions as to what lab reflects, advised to address additional questions to PCP. States will do so.

## 2023-01-27 IMAGING — MR MR CARD MORPHOLOGY WO/W CM
44 of 48 series · 44 of 48 positions shown · IV contrast (Contrast agent)
Comparison: none

CLINICAL DATA: Ischemic cardiomyopathy, assess viability.

EXAM:
CARDIAC MRI
TECHNIQUE: The patient was scanned on a 1.5 Tesla GE magnet. A dedicated
cardiac coil was used. Functional imaging was done using Fiesta
sequences. [DATE], and 4 chamber views were done to assess for RWMA's.
Modified Kiing rule using a short axis stack was used to
calculate an ejection fraction on a dedicated work station using
Circle software. The patient received 9 cc of Gadavist. After 10
minutes inversion recovery sequences were used to assess for
infiltration and scar tissue.
CONTRAST:  Gadavist 9 cc

[Series 4: [id number] · axial · 8.0mm · 1.41mm/px · 1 of 16 slices shown]
[im 1/16]
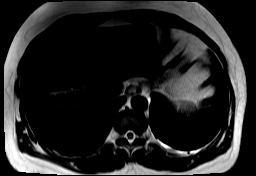

[Series 9: bSSFP · oblique · 8.0mm · 1.61mm/px · 1 of 25 slices shown (1 of 23)]
[im 1/25]
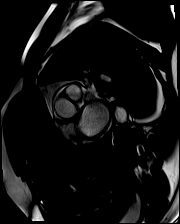

[Series 10: bSSFP · oblique · 8.0mm · 1.61mm/px · 1 of 25 slices shown (2 of 23)]
[im 1/25]
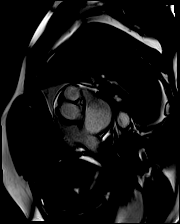

[Series 11: bSSFP · oblique · 8.0mm · 1.61mm/px · 1 of 25 slices shown (3 of 23)]
[im 1/25]
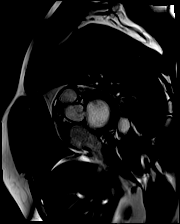

[Series 12: bSSFP · oblique · 8.0mm · 1.61mm/px · 1 of 25 slices shown (4 of 23)]
[im 1/25]
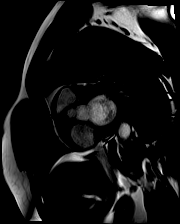

[Series 13: bSSFP · oblique · 8.0mm · 1.61mm/px · 1 of 25 slices shown (5 of 23)]
[im 1/25]
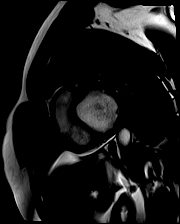

[Series 14: bSSFP · oblique · 8.0mm · 1.61mm/px · 1 of 25 slices shown (6 of 23)]
[im 1/25]
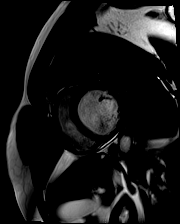

[Series 15: bSSFP · oblique · 8.0mm · 1.61mm/px · 1 of 25 slices shown (7 of 23)]
[im 1/25]
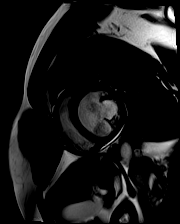

[Series 16: bSSFP · oblique · 8.0mm · 1.61mm/px · 1 of 25 slices shown (8 of 23)]
[im 1/25]
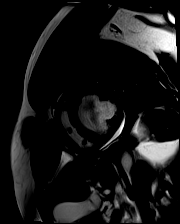

[Series 17: bSSFP · oblique · 8.0mm · 1.61mm/px · 1 of 25 slices shown (9 of 23)]
[im 1/25]
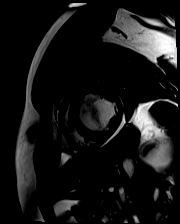

[Series 18: bSSFP · oblique · 8.0mm · 1.61mm/px · 1 of 25 slices shown (10 of 23)]
[im 1/25]
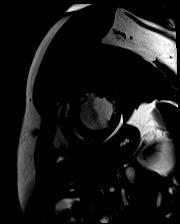

[Series 19: bSSFP · oblique · 8.0mm · 1.61mm/px · 1 of 25 slices shown (11 of 23)]
[im 1/25]
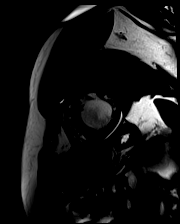

[Series 20: bSSFP · oblique · 8.0mm · 1.61mm/px · 1 of 25 slices shown (12 of 23)]
[im 1/25]
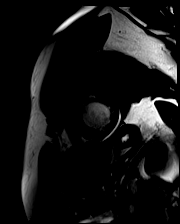

[Series 21: bSSFP · oblique · 8.0mm · 1.61mm/px · 1 of 25 slices shown (13 of 23)]
[im 1/25]
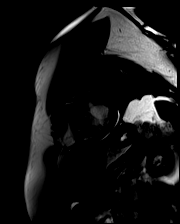

[Series 22: bSSFP · oblique · 8.0mm · 1.61mm/px · 1 of 25 slices shown (14 of 23)]
[im 1/25]
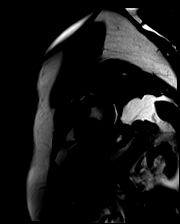

[Series 23: bSSFP · oblique · 8.0mm · 1.61mm/px · 1 of 25 slices shown (15 of 23)]
[im 1/25]
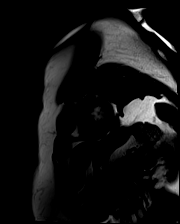

[Series 24: bSSFP · oblique · 8.0mm · 1.61mm/px · 1 of 25 slices shown (16 of 23)]
[im 1/25]
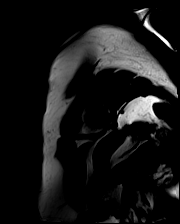

[Series 25: bSSFP · oblique · 8.0mm · 1.61mm/px · 1 of 25 slices shown (17 of 23)]
[im 1/25]
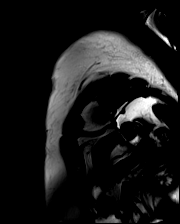

[Series 26: bSSFP · oblique · 8.0mm · 1.61mm/px · 1 of 25 slices shown (18 of 23)]
[im 1/25]
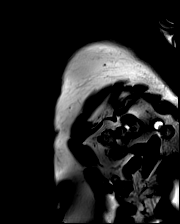

[Series 27: bSSFP · oblique · 8.0mm · 1.61mm/px · 1 of 25 slices shown (19 of 23)]
[im 1/25]
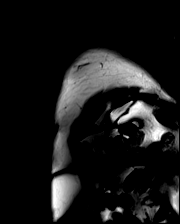

[Series 28: bSSFP · oblique · 6.0mm · 1.41mm/px · 1 of 25 slices shown (20 of 23)]
[im 1/25]
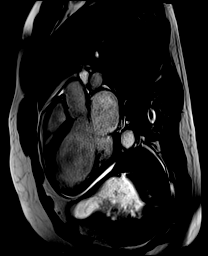

[Series 29: bSSFP · oblique · 6.0mm · 1.41mm/px · 1 of 25 slices shown (21 of 23)]
[im 1/25]
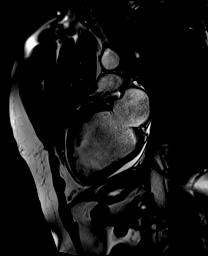

[Series 30: bSSFP · axial · 6.0mm · 1.41mm/px · 1 of 25 slices shown (22 of 23)]
[im 1/25]
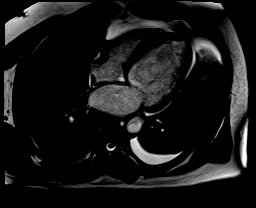

[Series 31: (id)_long_t1 · oblique · 8.0mm · 1.56mm/px · 1 of 24 slices shown]
[im 1/24]
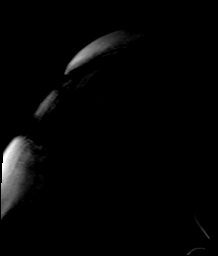

[Series 32: (id)_long_t1_moco · oblique · 8.0mm · 1.56mm/px · 1 of 24 slices shown]
[im 1/24]
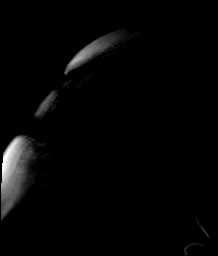

[Series 35: (id)_trufi · oblique · 8.0mm · 2.08mm/px · 1 of 9 slices shown]
[im 1/9]
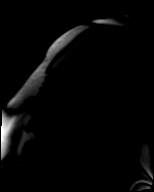

[Series 36: (id)_trufi_moco · oblique · 8.0mm · 2.08mm/px · 1 of 9 slices shown]
[im 1/9]
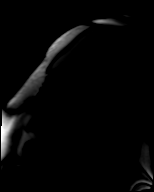

[Series 39: pre short axis · oblique · non-contrast · 8.0mm · 2.25mm/px · 1 of 10 slices shown (1 of 6)]
[im 1/10]
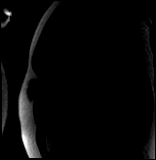

[Series 40: pre short axis · oblique · non-contrast · 8.0mm · 2.25mm/px · 1 of 10 slices shown (2 of 6)]
[im 1/10]
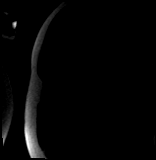

[Series 41: pre short axis · oblique · non-contrast · 8.0mm · 2.25mm/px · 1 of 10 slices shown (3 of 6)]
[im 1/10]
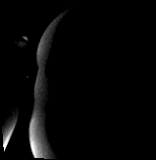

[Series 42: pre short axis · oblique · non-contrast · 8.0mm · 2.25mm/px · 1 of 10 slices shown (4 of 6)]
[im 1/10]
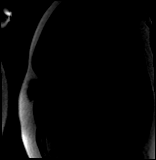

[Series 43: pre short axis · oblique · non-contrast · 8.0mm · 2.25mm/px · 1 of 10 slices shown (5 of 6)]
[im 1/10]
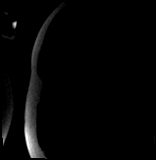

[Series 44: pre short axis · oblique · non-contrast · 8.0mm · 2.25mm/px · 1 of 10 slices shown (6 of 6)]
[im 1/10]
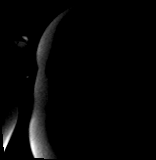

[Series 45: rest short axis · oblique · 8.0mm · 2.25mm/px · 1 of 60 slices shown (1 of 6)]
[im 1/60]
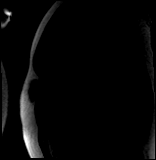

[Series 46: rest short axis · oblique · 8.0mm · 2.25mm/px · 1 of 60 slices shown (2 of 6)]
[im 1/60]
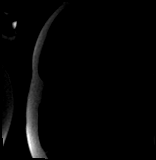

[Series 47: rest short axis · oblique · 8.0mm · 2.25mm/px · 1 of 60 slices shown (3 of 6)]
[im 1/60]
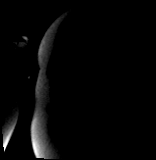

[Series 48: rest short axis · oblique · 8.0mm · 2.25mm/px · 1 of 60 slices shown (4 of 6)]
[im 1/60]
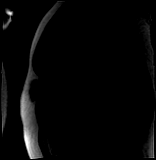

[Series 49: rest short axis · oblique · 8.0mm · 2.25mm/px · 1 of 60 slices shown (5 of 6)]
[im 1/60]
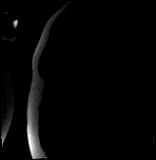

[Series 50: rest short axis · oblique · 8.0mm · 2.25mm/px · 1 of 60 slices shown (6 of 6)]
[im 1/60]
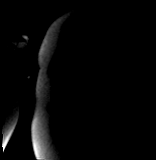

[Series 51: bSSFP · coronal · 6.0mm · 1.41mm/px · 1 of 25 slices shown (23 of 23)]
[im 1/25]
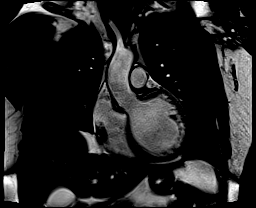

[Series 52: cine rvit · oblique · 6.0mm · 1.41mm/px · 1 of 25 slices shown]
[im 1/25]
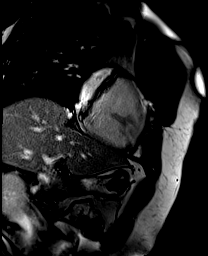

[Series 53: aortic valve cine · oblique · 6.0mm · 1.41mm/px · 1 of 25 slices shown (1 of 2)]
[im 1/25]
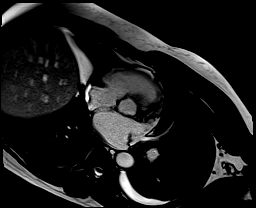

[Series 54: cine rvot · sagittal · 6.0mm · 1.41mm/px · 1 of 25 slices shown]
[im 1/25]
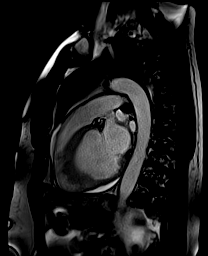

[Series 55: aortic valve cine · oblique · 6.0mm · 1.41mm/px · 1 of 25 slices shown (2 of 2)]
[im 1/25]
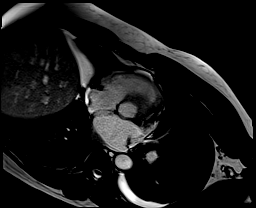

[44 of 48 positions shown; findings below may reference images not displayed]

FINDINGS: Small left pleural effusion.  Trivial pericardial effusion.

Moderately dilated left ventricle with normal wall thickness. There
was global hypokinesis with akinesis of the inferolateral and
anterolateral walls, EF 15%. Normal right ventricular size with
moderate systolic dysfunction, EF 31%. Mild left atrial enlargement.
Normal right atrium. Trileaflet aortic valve with no significant
stenosis or regurgitation. There was at least mild mitral
regurgitation (flow sequences to quantify were not done).

Delayed enhancement imaging:

25-49% wall thickness subendocardial late gadolinium enhancement
(LGE) in the entire inferolateral and anterolateral walls.

<25% wall thickness subendocardial LGE in the mid to apical anterior
wall.

Measurements:

LVEDV 295 mL

LVSV 44 mL
LVEF 15%

RVEDV 132 mL
RVSV 41 mL
RVEF 31%
IMPRESSION: 1. Moderately dilated LV with EF 15%, global hypokinesis with
lateral akinesis.

2.  Normal RV size with EF 31%

3. LGE pattern suggests substantial viability (LGE noted primarily
in lateral wall, <50% wall thickness)

Noemicita Fv

## 2023-01-29 IMAGING — DX DG CHEST 1V PORT
1 series · 1 of 1 positions shown · non-contrast
Comparison: 03/23/2021 and older studies.

CLINICAL DATA: Status post cardiac surgery.  Follow-up exam.

EXAM:
PORTABLE CHEST 1 VIEW

[chest]
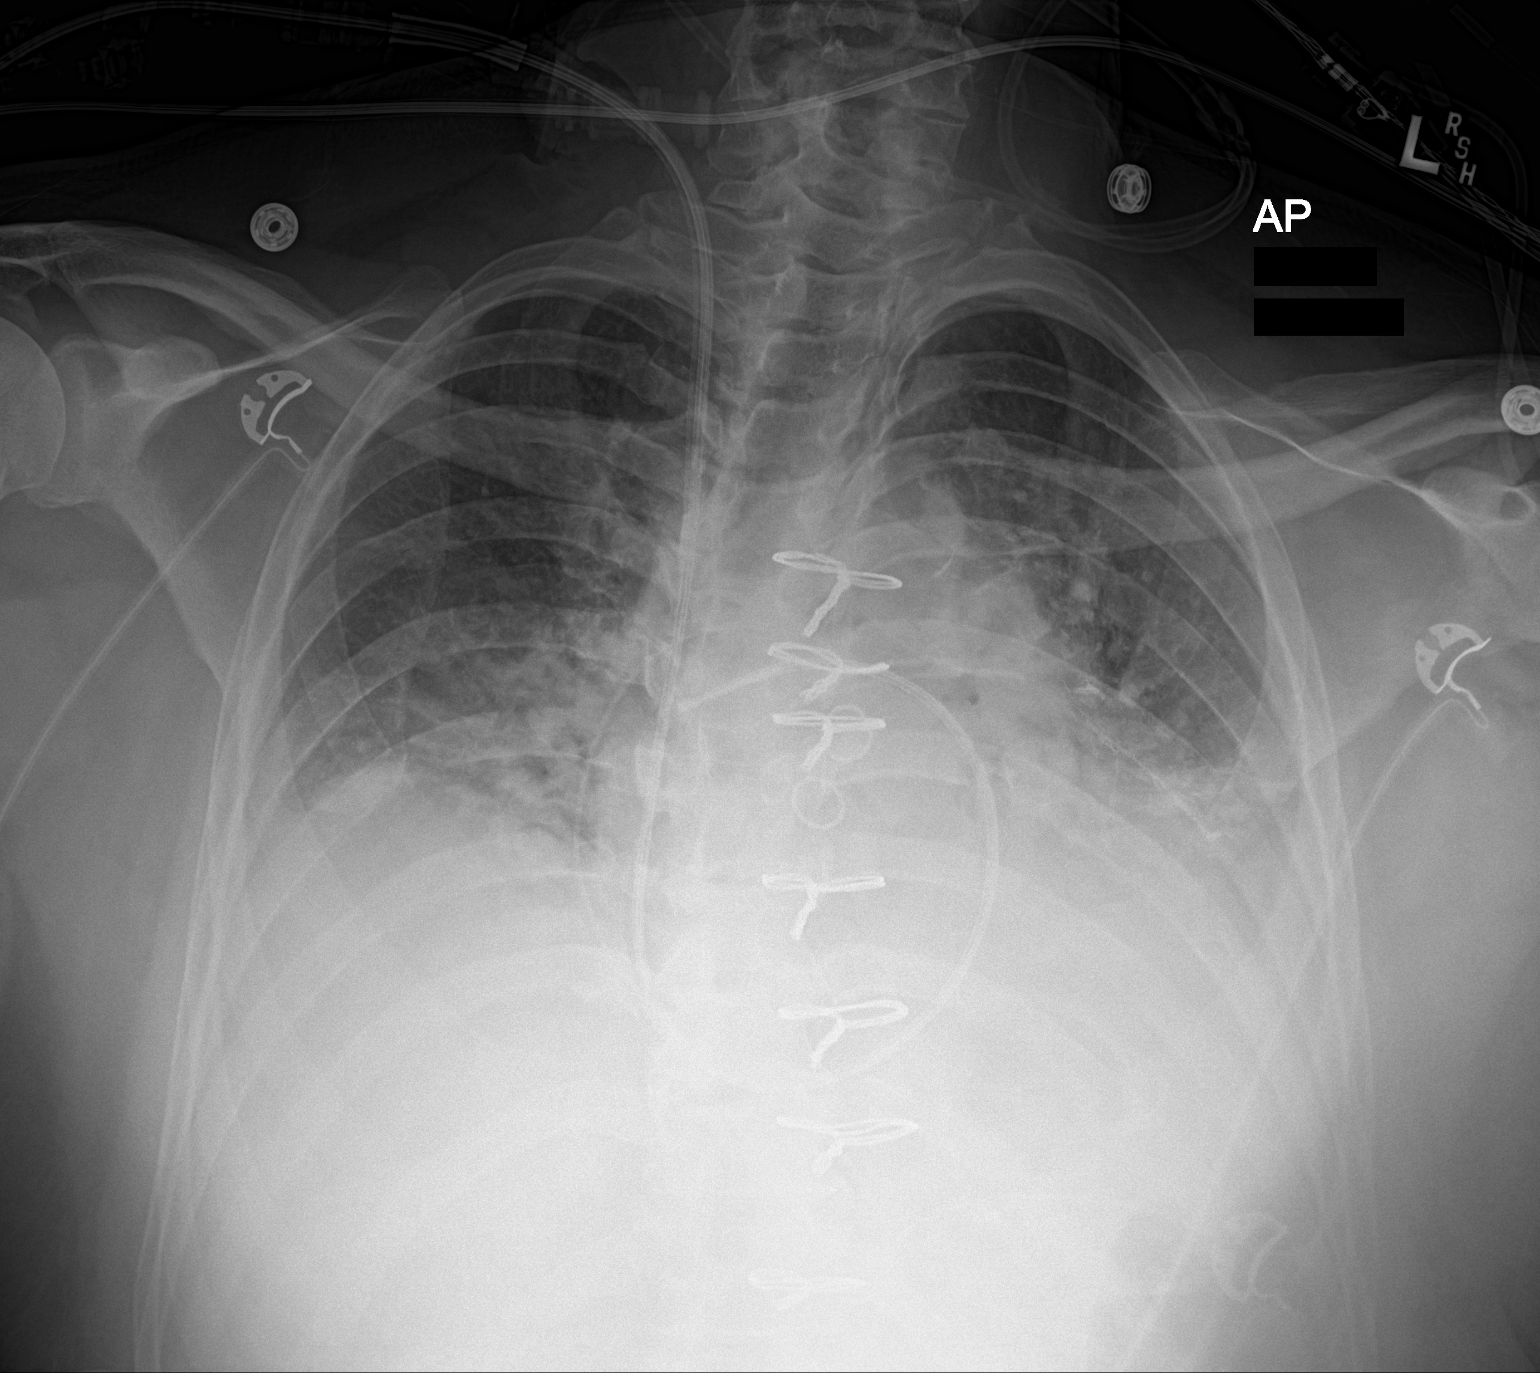

[1 of 1 positions shown; findings below may reference images not displayed]

FINDINGS: Bibasilar lung opacities are noted consistent with atelectasis,
similar to the previous day's study and accentuated by low lung
volumes.

No evidence of pulmonary edema.  No pneumothorax.

No mediastinal widening.

Right internal jugular Swan-Ganz catheter tip projects in the right
pulmonary artery.
IMPRESSION: 1. Persistent lung base opacity consistent with atelectasis
accentuated by low lung volumes.
2. No pulmonary edema, pneumothorax or mediastinal widening.
Well-positioned Swan-Ganz catheter.

## 2023-01-31 IMAGING — DX DG CHEST 1V PORT
1 series · 1 of 1 positions shown · non-contrast
Comparison: Chest x-ray dated 03/24/2021.

CLINICAL DATA: Pleural effusion

EXAM:
PORTABLE CHEST 1 VIEW

[chest]
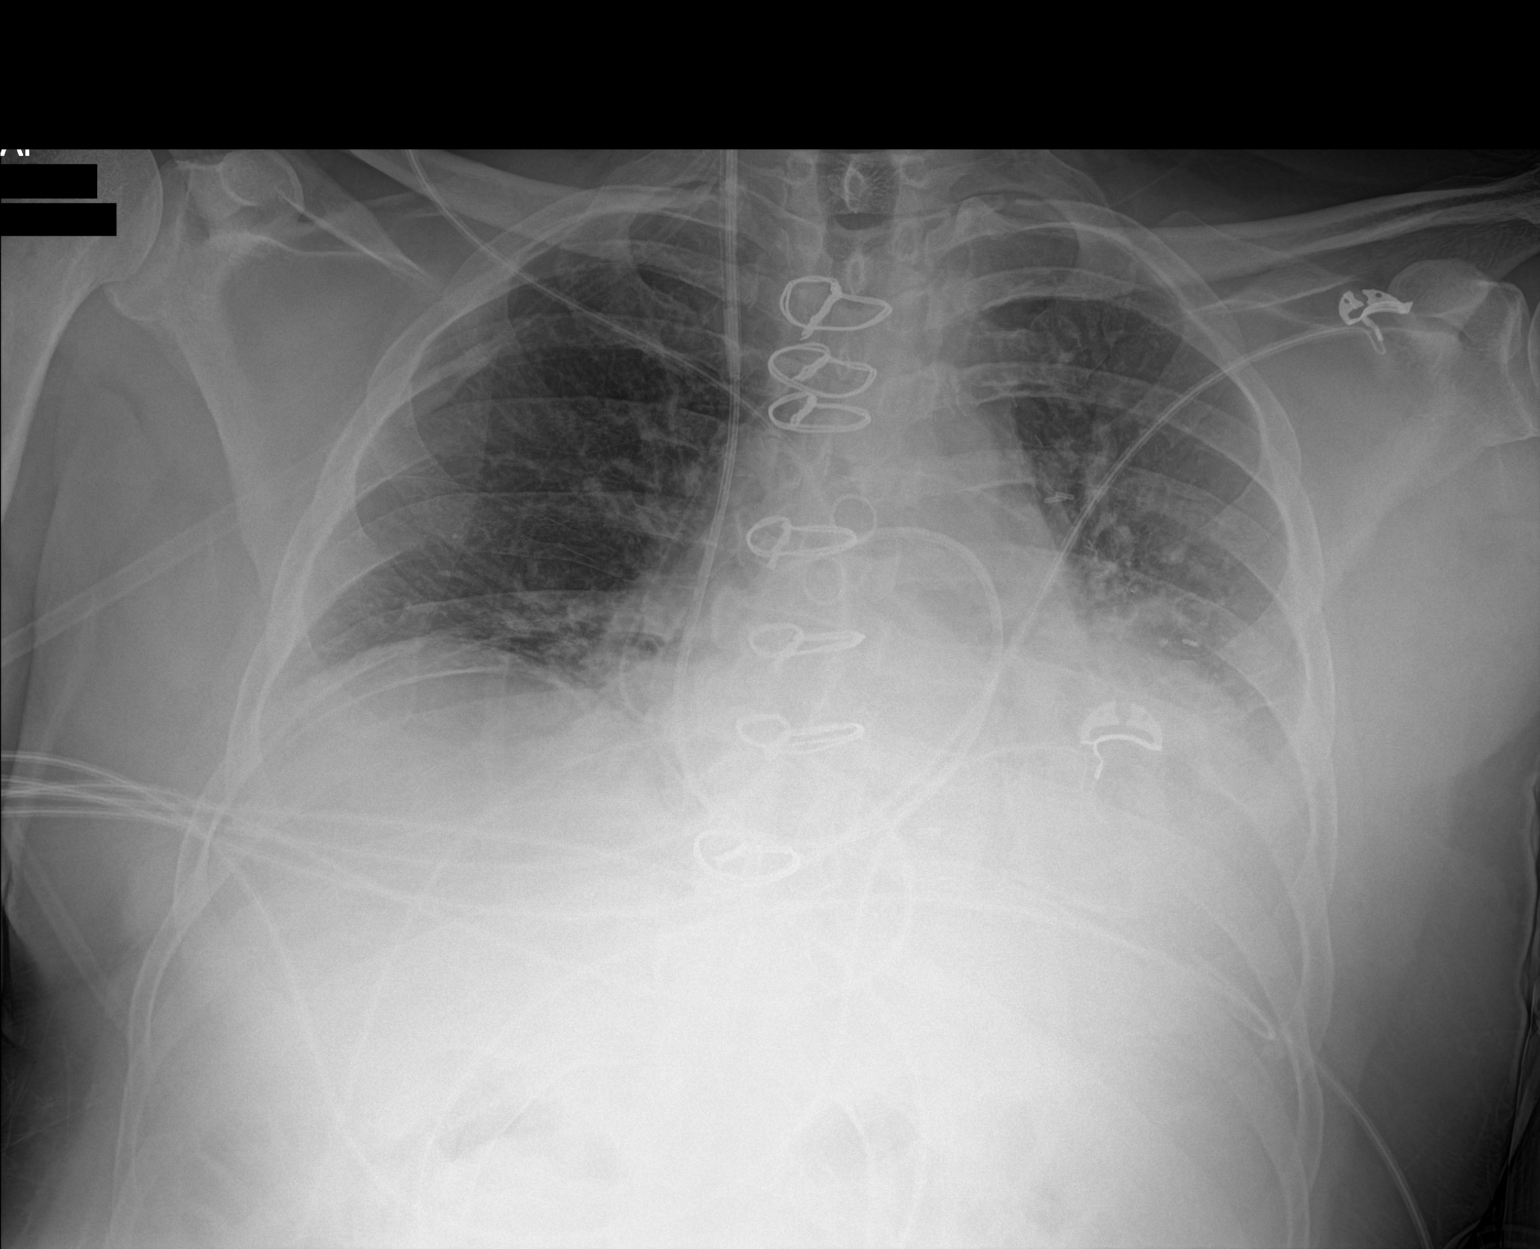

[1 of 1 positions shown; findings below may reference images not displayed]

FINDINGS: Stable hazy opacity at the LEFT lung base, compatible with
atelectasis and/or small pleural effusion.

Improved aeration at the RIGHT lung base, compatible with resolving
atelectasis.

No new lung findings. No pneumothorax is seen. Heart size and
mediastinal contours appear stable. Swan-Ganz catheter is stable in
position with tip just to the RIGHT of midline.
IMPRESSION: 1. Stable hazy opacity at the LEFT lung base, compatible with
atelectasis and/or small pleural effusion.
2. Improved aeration at the RIGHT lung base, compatible with
resolving atelectasis.

## 2023-02-20 ENCOUNTER — Other Ambulatory Visit (HOSPITAL_COMMUNITY): Payer: Self-pay

## 2023-02-20 ENCOUNTER — Other Ambulatory Visit (HOSPITAL_COMMUNITY): Payer: Self-pay | Admitting: Family Medicine

## 2023-02-25 ENCOUNTER — Other Ambulatory Visit (HOSPITAL_COMMUNITY): Payer: Self-pay

## 2023-03-05 ENCOUNTER — Other Ambulatory Visit (HOSPITAL_COMMUNITY): Payer: Self-pay

## 2023-03-05 ENCOUNTER — Other Ambulatory Visit (HOSPITAL_COMMUNITY): Payer: Self-pay | Admitting: Family Medicine

## 2023-03-05 MED ORDER — FUROSEMIDE 20 MG PO TABS: 20.0000 mg | ORAL_TABLET | Freq: Every day | ORAL | 3 refills | Status: DC

## 2023-03-06 ENCOUNTER — Other Ambulatory Visit (HOSPITAL_COMMUNITY): Payer: Self-pay

## 2023-03-08 ENCOUNTER — Other Ambulatory Visit (HOSPITAL_COMMUNITY): Payer: Self-pay

## 2023-03-11 ENCOUNTER — Other Ambulatory Visit (HOSPITAL_COMMUNITY): Payer: Self-pay

## 2023-03-11 ENCOUNTER — Telehealth (HOSPITAL_COMMUNITY): Payer: Self-pay

## 2023-03-11 NOTE — Telephone Encounter (Signed)
Patient Advocate Encounter  Prior authorization for [employer] submitted and APPROVED. Test billing returns $47 copay for 30 day supply.  Key [id number] Effective: 03/11/23 - 03/09/24  Clista Bernhardt, CPhT Rx Patient Advocate Phone: [phone]

## 2023-03-12 ENCOUNTER — Telehealth (HOSPITAL_COMMUNITY): Payer: Self-pay

## 2023-03-12 ENCOUNTER — Other Ambulatory Visit (HOSPITAL_COMMUNITY): Payer: Self-pay

## 2023-03-12 NOTE — Telephone Encounter (Signed)
Patient states insurance is no longer covering Entresto. PA needed?

## 2023-04-29 ENCOUNTER — Ambulatory Visit (HOSPITAL_COMMUNITY)
Admission: RE | Admit: 2023-04-29 | Discharge: 2023-04-29 | Disposition: A | Discharge status: Home / Self Care | Payer: Medicare Other | Source: Ambulatory Visit | Attending: Cardiology | Admitting: Cardiology

## 2023-04-29 ENCOUNTER — Encounter (HOSPITAL_COMMUNITY): Payer: Self-pay | Admitting: Cardiology

## 2023-04-29 ENCOUNTER — Other Ambulatory Visit (HOSPITAL_COMMUNITY): Payer: Self-pay

## 2023-04-29 VITALS — BP 120/80 | HR 73 | Wt 217.4 lb

## 2023-04-29 DIAGNOSIS — Z79899 Other long term (current) drug therapy: Secondary | ICD-10-CM | POA: Diagnosis not present

## 2023-04-29 DIAGNOSIS — Z955 Presence of coronary angioplasty implant and graft: Secondary | ICD-10-CM | POA: Insufficient documentation

## 2023-04-29 DIAGNOSIS — I252 Old myocardial infarction: Secondary | ICD-10-CM | POA: Insufficient documentation

## 2023-04-29 DIAGNOSIS — E785 Hyperlipidemia, unspecified: Secondary | ICD-10-CM | POA: Insufficient documentation

## 2023-04-29 DIAGNOSIS — I447 Left bundle-branch block, unspecified: Secondary | ICD-10-CM | POA: Diagnosis not present

## 2023-04-29 DIAGNOSIS — I2510 Atherosclerotic heart disease of native coronary artery without angina pectoris: Secondary | ICD-10-CM | POA: Insufficient documentation

## 2023-04-29 DIAGNOSIS — Z951 Presence of aortocoronary bypass graft: Secondary | ICD-10-CM | POA: Insufficient documentation

## 2023-04-29 DIAGNOSIS — N183 Chronic kidney disease, stage 3 unspecified: Secondary | ICD-10-CM | POA: Diagnosis not present

## 2023-04-29 DIAGNOSIS — I5022 Chronic systolic (congestive) heart failure: Secondary | ICD-10-CM

## 2023-04-29 LAB — DIGOXIN LEVEL: Digoxin Level: 0.6 ng/mL — ABNORMAL LOW (ref 0.8–2.0)

## 2023-04-29 LAB — BASIC METABOLIC PANEL
Anion gap: 8 (ref 5–15)
BUN: 22 mg/dL (ref 8–23)
CO2: 28 mmol/L (ref 22–32)
Calcium: 9.1 mg/dL (ref 8.9–10.3)
Chloride: 102 mmol/L (ref 98–111)
Creatinine, Ser: 1.81 mg/dL — ABNORMAL HIGH (ref 0.61–1.24)
GFR, Estimated: 40 mL/min — ABNORMAL LOW (ref >60)
Glucose, Bld: 127 mg/dL — ABNORMAL HIGH (ref 70–99)
Potassium: 3.9 mmol/L (ref 3.5–5.1)
Sodium: 138 mmol/L (ref 135–145)

## 2023-04-29 LAB — BRAIN NATRIURETIC PEPTIDE: B Natriuretic Peptide: 236 pg/mL — ABNORMAL HIGH (ref 0.0–100.0)

## 2023-04-29 LAB — LIPID PANEL
Cholesterol: 106 mg/dL (ref 0–200)
HDL: 31 mg/dL — ABNORMAL LOW (ref >40)
LDL Cholesterol: 64 mg/dL (ref 0–99)
Total CHOL/HDL Ratio: 3.4 RATIO
Triglycerides: 55 mg/dL (ref <150)
VLDL: 11 mg/dL (ref 0–40)

## 2023-04-29 MED ORDER — FUROSEMIDE 20 MG PO TABS: 20.0000 mg | ORAL_TABLET | Freq: Every day | ORAL | 3 refills | Status: DC

## 2023-04-29 MED ORDER — ENTRESTO 49-51 MG PO TABS: 1.0000 | ORAL_TABLET | Freq: Two times a day (BID) | ORAL | 3 refills | Status: DC

## 2023-04-29 NOTE — Patient Instructions (Signed)
INCREASE Entresto to 49/51 mg Twice daily  DECREASE Lasix to 20 mg daily.  Labs done today, your results will be available in MyChart, we will contact you for abnormal readings.  Your physician has requested that you have an echocardiogram. Echocardiography is a painless test that uses sound waves to create images of your heart. It provides your doctor with information about the size and shape of your heart and how well your heart's chambers and valves are working. This procedure takes approximately one hour. There are no restrictions for this procedure. Please do NOT wear cologne, perfume, aftershave, or lotions (deodorant is allowed). Please arrive 15 minutes prior to your appointment time.  Your physician recommends that you schedule a follow-up appointment in: 3 months with an echocardiogram   If you have any questions or concerns before your next appointment please send [place] a message through [place] or call our office at [phone].    TO LEAVE A MESSAGE FOR THE NURSE SELECT OPTION 2, PLEASE LEAVE A MESSAGE INCLUDING: YOUR NAME DATE OF BIRTH CALL BACK NUMBER REASON FOR CALL**this is important as we prioritize the call backs  YOU WILL RECEIVE A CALL BACK THE SAME DAY AS LONG AS YOU CALL BEFORE 4:00 PM  At the [facility], you and your health needs are our priority. As part of our continuing mission to provide you with exceptional heart care, we have created designated Provider Care Teams. These Care Teams include your primary Cardiologist (physician) and Advanced Practice Providers (APPs- Physician Assistants and Nurse Practitioners) who all work together to provide you with the care you need, when you need it.   You may see any of the following providers on your designated Care Team at your next follow up: Dr Arvilla Meres Dr Marca Ancona Dr. Marcos Eke, NP Robbie Lis, [place] [facility], [place] Brynda Peon, NP Karle Plumber, PharmD   Please be sure to bring in all your medications bottles to every appointment.    Thank you for choosing [facility]

## 2023-04-29 NOTE — Progress Notes (Signed)
PCP: Dr Para March  Primary Cardiologist: Dr Shirlee Latch   HPI: Edward [name] is a 67 y.o. with a history of  CAD, MI 2004 DES x2 totally occluded CFX (infarct-related artery) and 80% mRCA stenosis with taxus DES to both vessels, hyperlipidemia. Echo in 2011 showed EF 40-45%. He does not drink alcohol. Retired from [employer].    Admitted 03/20/21 for scheduled cath. Cath showed multivessel, PCWP 30, and cardiac index 1.7. Echo showed EF 20-25%.  CT surgery consulted. Prior to surgery diuresed with IV lasix.  Had CABG x3. Post op course complicated by AKI. Drips slowly weaned off. GDMT limited by AKI.   Echo in 10/22 showed EF 30%, mild LV dilation/mild LVH, mildly decreased RV systolic function. Echo in 10/23 showed EF 25-30%, mild LV dilation with septal-lateral dyssynchrony, mildly decreased RV systolic function, normal IVC.   Today he returns for HF follow up.  Weight down 5 lbs.  He continues to do well symptomatically.  No dyspnea walking up stairs or walking on flat ground.  Does a lot of bass fishing on weekends. Works out in gym several times/week. No orthopnea/PND.  No chest pain. No lightheadedness.  Generally, does not feel limited.   ECG (personally reviewed): NSR, IVCD 146 msec, PVC  Labs (6/22): LDL 50 Labs (7/22): K 4.2, creatinine 1.47 Labs (8/22): K 3.7, creatinine 1.5 Labs (9/22): K 4.4, creatinine 1.66 Labs (6/23): K 3.9, creatinine 1.6 Labs (1/24): K 3.7, creatinine 1.59, BNP 363  PMH: 1. CAD:  - MI 2004 with DES to occluded LCx and DES to [facility].  - LHC 4/22 with 3VD.  - CABG (4/22): LIMA-LAD, SVG-OM, SVG-PDA.  2. CKD: Stage 3.  3. H/o HCV 4. Hyperlipidemia 5. Chronic systolic CHF: Ischemic cardiomyopathy.  - Echo (2011): EF 40-45%.  - Echo (4/22): EF 20-25%, normal RV systolic function.  - RHC (4/22): mean PCWP 30, CI 1.7 - Echo (10/22): EF 30%, mild LV dilation/mild LVH, mildly decreased RV systolic function. - Echo (10/23): EF 25-30%, mild LV dilation with  septal-lateral dyssynchrony, mildly decreased RV systolic function, normal IVC.   ROS: All systems negative except as listed in HPI, PMH and Problem List.  Social History   Socioeconomic History   Marital status: Married    Spouse name: Neco Viverito   Number of children: 3   Years of education: 16   Highest education level: Not on file  Occupational History   Occupation: Retired  Tobacco Use   Smoking status: Former    Packs/day: 1.00    Years: 10.00    Additional pack years: 0.00    Total pack years: 10.00    Types: Cigarettes    Quit date: 12/11/2003    Years since quitting: 19.3   Smokeless tobacco: Never  Vaping Use   Vaping Use: Never used  Substance and Sexual Activity   Alcohol use: No   Drug use: No   Sexual activity: Not on file  Other Topics Concern   Not on file  Social History Narrative   Left handed    Married, 1980   Lives in [place]   3 children   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   Likes bass fishing   Caffeine: 2cups coffee or less per day   Social Determinants of Corporate investment banker Strain: Not on file  Food Insecurity: Not on file  Transportation Needs: Not on file  Physical Activity: Not on file  Stress: Not on file  Social Connections: Not on file  Intimate Partner Violence: Not on file    Family History  Problem Relation Age of Onset   Aneurysm Father        brain   Stroke Father    Other Mother        ASCVD   Hypertension Mother    Heart attack Other        uncle   Diabetes Child    Diabetes Child    Colon cancer Neg Hx    Prostate cancer Neg Hx     Current Outpatient Medications  Medication Sig Dispense Refill   aspirin 81 MG EC tablet Take 1 tablet (81 mg total) by mouth daily.     atorvastatin (LIPITOR) 80 MG tablet Take 1 tablet by mouth once daily 30 tablet 11   digoxin (LANOXIN) 0.125 MG tablet Take 1 tablet by mouth once daily 90 tablet 3   eplerenone (INSPRA) 50 MG tablet Take 1  tablet (50 mg total) by mouth daily. 90 tablet 3   FARXIGA 10 MG TABS tablet TAKE 1 TABLET BY MOUTH ONCE DAILY BEFORE BREAKFAST 30 tablet 11   loratadine (CLARITIN) 10 MG tablet Take 10 mg by mouth daily.     metoprolol succinate (TOPROL-XL) 50 MG 24 hr tablet Take 1 tablet (50 mg total) by mouth daily. 90 tablet 3   Multiple Vitamin (MULTIVITAMIN WITH MINERALS) TABS tablet Take 1 tablet by mouth daily.     sacubitril-valsartan (ENTRESTO) 49-51 MG Take 1 tablet by mouth 2 (two) times daily. 180 tablet 3   furosemide (LASIX) 20 MG tablet Take 1 tablet (20 mg total) by mouth daily. 90 tablet 3   No current facility-administered medications for this encounter.    Vitals:   04/29/23 0943  BP: 120/80  Pulse: 73  SpO2: 96%  Weight: 98.6 kg (217 lb 6.4 oz)   Wt Readings from Last 3 Encounters:  04/29/23 98.6 kg (217 lb 6.4 oz)  01/08/23 100.7 kg (222 lb)  12/11/22 100.2 kg (221 lb)    PHYSICAL EXAM: General: NAD Neck: No JVD, no thyromegaly or thyroid nodule.  Lungs: Clear to auscultation bilaterally with normal respiratory effort. CV: Nondisplaced PMI.  Heart regular S1/S2, no S3/S4, no murmur.  No peripheral edema.  No carotid bruit.  Normal pedal pulses.  Abdomen: Soft, nontender, no hepatosplenomegaly, no distention.  Skin: Intact without lesions or rashes.  Neurologic: Alert and oriented x 3.  Psych: Normal affect. Extremities: No clubbing or cyanosis.  HEENT: Normal.   ASSESSMENT & PLAN: 1. CAD: Had MI in 2004 with DESx2 with totally occluded CFX (infarct-related artery) and 80% mRCA stenosis.  Patient had Taxus DES to both vessels.  In 4/22, noted to have EF down to 20-25% on echo, set up for coronary angiography. LHC 4/22 showed high-grade subtotal stenoses of the LAD, left circumflex, and total occlusion of the previously placed stents in the circumflex marginal and RCA.  CMRI with substantial viability.  03/10/21 patient had CABG with LIMA-LAD, SVG-OM, SVG-PDA.  No chest pain.    - Continue ASA 81  - Continue atorvastatin 80 daily, check lipids today.    2. Chronic systolic CHF:  Ischemic cardiomyopathy.  Echo in 4/22 with EF 20-25%, severe LV dilation, mildly dilated RV with normal systolic function, moderate Edward.  RHC 4/22 with elevated PCWP and CI 1.7.  Patient had CABG, has been doing well since.  Echo in 10/22 with EF 30%, echo 10/23 showed EF 25-30%, mild LV dilation,  septal-lateral dyssynchrony, mild RV dysfunction.  NYHA class I symptoms.  He is not volume overloaded, weight down 5 lbs.  - Decrease Lasix to 20 mg daily and increase Entresto to 49/51 bid with BMET/BNP today and again in 10 days.  - Continue Farxiga 10 mg daily.   - Continue Toprol XL 50 mg daily.    - continue digoxin 0.125, check level today.  - Continue eplerenone 50 mg daily.  - EF 25-30% on last echo with LBBB-like IVCD 146 msec on today's ECG.  With dyssynchrony on last echo, I recommended CRT-D.  He has seen EP and device was recommended.  He does not want an implanted device.  I will arrange for repeat echo at followup in 3 months.  3. CKD stage 3: BMET today.  4. Hyperlipidemia: Atorvastatin 80 daily, check lipids today.   Followup in 3 months with echo.    Marca Ancona 04/29/2023

## 2023-05-01 ENCOUNTER — Telehealth (HOSPITAL_COMMUNITY): Payer: Self-pay

## 2023-05-01 NOTE — Telephone Encounter (Signed)
Patient called to go over repeat labs. I sent him order to go to [facility] near him.

## 2023-05-13 LAB — FECAL OCCULT BLOOD, IMMUNOCHEMICAL: IFOBT: POSITIVE

## 2023-05-15 ENCOUNTER — Other Ambulatory Visit: Payer: Self-pay

## 2023-05-15 DIAGNOSIS — I5022 Chronic systolic (congestive) heart failure: Secondary | ICD-10-CM | POA: Diagnosis not present

## 2023-05-16 LAB — BASIC METABOLIC PANEL
BUN/Creatinine Ratio: 9 — ABNORMAL LOW (ref 10–24)
BUN: 14 mg/dL (ref 8–27)
CO2: 21 mmol/L (ref 20–29)
Calcium: 9.1 mg/dL (ref 8.6–10.2)
Chloride: 107 mmol/L — ABNORMAL HIGH (ref 96–106)
Creatinine, Ser: 1.63 mg/dL — ABNORMAL HIGH (ref 0.76–1.27)
Glucose: 148 mg/dL — ABNORMAL HIGH (ref 70–99)
Potassium: 4.4 mmol/L (ref 3.5–5.2)
Sodium: 143 mmol/L (ref 134–144)
eGFR: 46 mL/min/{1.73_m2} — ABNORMAL LOW (ref >59)

## 2023-05-16 LAB — SPECIMEN STATUS REPORT

## 2023-05-27 ENCOUNTER — Other Ambulatory Visit (HOSPITAL_COMMUNITY): Payer: Self-pay | Admitting: Cardiology

## 2023-05-28 DIAGNOSIS — L821 Other seborrheic keratosis: Secondary | ICD-10-CM | POA: Diagnosis not present

## 2023-05-28 DIAGNOSIS — L910 Hypertrophic scar: Secondary | ICD-10-CM | POA: Diagnosis not present

## 2023-05-28 DIAGNOSIS — B078 Other viral warts: Secondary | ICD-10-CM | POA: Diagnosis not present

## 2023-06-04 ENCOUNTER — Other Ambulatory Visit: Payer: Self-pay | Admitting: Family Medicine

## 2023-06-04 DIAGNOSIS — R195 Other fecal abnormalities: Secondary | ICD-10-CM

## 2023-06-04 NOTE — Progress Notes (Signed)
See scanned report re: pos IFOB, patient agreed to GI referral.

## 2023-06-11 ENCOUNTER — Telehealth: Payer: Self-pay

## 2023-06-11 NOTE — Telephone Encounter (Signed)
GI called and left message for patient this morning to call back to schedule appointment.

## 2023-06-11 NOTE — Telephone Encounter (Signed)
Edward [name] with [facility] called report + ifob; Edward [name] wanted to verify that Dr Para March received letter mailed 05/13/23 that pt had + ifob. See orders only on 06/04/23. Noted on 06/04/23 orders + ifob and pt agreerd to GI referral. Sending note to Dr Para March and Para March pool.

## 2023-06-12 NOTE — Telephone Encounter (Signed)
Noted. Thanks.

## 2023-06-21 ENCOUNTER — Ambulatory Visit: Payer: Medicare Other | Admitting: Family Medicine

## 2023-06-21 ENCOUNTER — Ambulatory Visit (INDEPENDENT_AMBULATORY_CARE_PROVIDER_SITE_OTHER): Payer: Medicare Other | Admitting: Family Medicine

## 2023-06-21 ENCOUNTER — Encounter: Payer: Self-pay | Admitting: Family Medicine

## 2023-06-21 VITALS — BP 110/80 | HR 69 | Temp 97.5°F | Ht 70.0 in | Wt 219.0 lb

## 2023-06-21 DIAGNOSIS — R195 Other fecal abnormalities: Secondary | ICD-10-CM | POA: Diagnosis not present

## 2023-06-21 DIAGNOSIS — I5022 Chronic systolic (congestive) heart failure: Secondary | ICD-10-CM | POA: Diagnosis not present

## 2023-06-21 DIAGNOSIS — R739 Hyperglycemia, unspecified: Secondary | ICD-10-CM

## 2023-06-21 DIAGNOSIS — Z7189 Other specified counseling: Secondary | ICD-10-CM

## 2023-06-21 LAB — CBC WITH DIFFERENTIAL/PLATELET
Basophils Absolute: 0 10*3/uL (ref 0.0–0.1)
Basophils Relative: 0.5 % (ref 0.0–3.0)
Eosinophils Absolute: 0.1 10*3/uL (ref 0.0–0.7)
Eosinophils Relative: 3.7 % (ref 0.0–5.0)
HCT: 52.5 % — ABNORMAL HIGH (ref 39.0–52.0)
Hemoglobin: 17.1 g/dL — ABNORMAL HIGH (ref 13.0–17.0)
Lymphocytes Relative: 33 % (ref 12.0–46.0)
Lymphs Abs: 1.3 10*3/uL (ref 0.7–4.0)
MCHC: 32.5 g/dL (ref 30.0–36.0)
MCV: 91.9 fl (ref 78.0–100.0)
Monocytes Absolute: 0.4 10*3/uL (ref 0.1–1.0)
Monocytes Relative: 8.8 % (ref 3.0–12.0)
Neutro Abs: 2.2 10*3/uL (ref 1.4–7.7)
Neutrophils Relative %: 54 % (ref 43.0–77.0)
Platelets: 116 10*3/uL — ABNORMAL LOW (ref 150.0–400.0)
RBC: 5.72 Mil/uL (ref 4.22–5.81)
RDW: 13.5 % (ref 11.5–15.5)
WBC: 4 10*3/uL (ref 4.0–10.5)

## 2023-06-21 LAB — HEMOGLOBIN A1C: Hgb A1c MFr Bld: 6.7 % — ABNORMAL HIGH (ref 4.6–6.5)

## 2023-06-21 NOTE — Patient Instructions (Signed)
Go to the lab on the way out.   If you have mychart we'll likely use that to update you.    Take care.  Glad to see you. 

## 2023-06-21 NOTE — Progress Notes (Unsigned)
He had FOBT positive, referred to GI, d/w pt.  Only on aspirin, not other blood thinners.  He was straining with BM prior to test collection.  He has f/u pending with [facility].    Hypertension/CHF.   Using medication without problems or lightheadedness: yes Chest pain with exertion:no Edema:no Short of breath:no Sleeping on 1 pillow.   AM weight is usually ~ 218-220.    D/w pt about sugar and recheck labs pending.  See notes on labs.  He moved to [facility], has separated from his wife.  Advance directive-wife still designated if patient were incapacitated.  He is back and forth in the meantime.    Meds, vitals, and allergies reviewed.   PMH and SH reviewed  ROS: Per HPI unless specifically indicated in ROS section   GEN: nad, alert and oriented HEENT: mucous membranes moist NECK: supple w/o LA CV: rrr. PULM: ctab, no inc wob ABD: soft, +bs EXT: no edema SKIN: well perfused.    30 minutes were devoted to patient care in this encounter (this includes time spent reviewing the patient's file/history, interviewing and examining the patient, counseling/reviewing plan with patient).

## 2023-06-23 ENCOUNTER — Other Ambulatory Visit: Payer: Self-pay | Admitting: Family Medicine

## 2023-06-23 DIAGNOSIS — R195 Other fecal abnormalities: Secondary | ICD-10-CM | POA: Insufficient documentation

## 2023-06-23 DIAGNOSIS — E119 Type 2 diabetes mellitus without complications: Secondary | ICD-10-CM

## 2023-06-23 DIAGNOSIS — E1159 Type 2 diabetes mellitus with other circulatory complications: Secondary | ICD-10-CM

## 2023-06-23 MED ORDER — OZEMPIC (0.25 OR 0.5 MG/DOSE) 2 MG/3ML ~~LOC~~ SOPN: PEN_INJECTOR | SUBCUTANEOUS | 2 refills | Status: DC

## 2023-06-23 NOTE — Assessment & Plan Note (Signed)
He moved to [facility], has separated from his wife.  Advance directive-wife still designated if patient were incapacitated.  He is back and forth in the meantime.

## 2023-06-23 NOTE — Assessment & Plan Note (Signed)
He has follow-up pending with GI.  Discussed rationale for follow-up.

## 2023-06-23 NOTE — Progress Notes (Signed)
Please start the PA on ozempic.  Dx diabetes.  thanks.

## 2023-06-23 NOTE — Assessment & Plan Note (Signed)
Weight has been stable on home checks.  Rationale for current medications discussed with patient.  Discussed potentially using Ozempic but it makes sense to check his A1c first.  He may get some benefit in terms of cardiovascular risk reduction with that medication.  Routine Ozempic cautions discussed with patient, especially regarding GI symptoms and possible pancreatitis.  See notes on labs.

## 2023-06-24 ENCOUNTER — Other Ambulatory Visit (HOSPITAL_COMMUNITY): Payer: Self-pay

## 2023-07-20 ENCOUNTER — Other Ambulatory Visit (HOSPITAL_COMMUNITY): Payer: Self-pay | Admitting: Cardiology

## 2023-07-29 ENCOUNTER — Encounter: Payer: Self-pay | Admitting: Family Medicine

## 2023-07-30 ENCOUNTER — Ambulatory Visit (HOSPITAL_COMMUNITY)
Admission: RE | Admit: 2023-07-30 | Discharge: 2023-07-30 | Disposition: A | Discharge status: Home / Self Care | Payer: Medicare Other | Source: Ambulatory Visit | Attending: Cardiology | Admitting: Cardiology

## 2023-07-30 ENCOUNTER — Ambulatory Visit (HOSPITAL_COMMUNITY)
Admission: RE | Admit: 2023-07-30 | Discharge: 2023-07-30 | Disposition: A | Discharge status: Home / Self Care | Payer: Medicare Other | Source: Ambulatory Visit | Attending: Family Medicine | Admitting: Family Medicine

## 2023-07-30 ENCOUNTER — Encounter (HOSPITAL_COMMUNITY): Payer: Self-pay | Admitting: Cardiology

## 2023-07-30 VITALS — BP 104/70 | HR 66 | Wt 213.8 lb

## 2023-07-30 DIAGNOSIS — E785 Hyperlipidemia, unspecified: Secondary | ICD-10-CM | POA: Insufficient documentation

## 2023-07-30 DIAGNOSIS — I5022 Chronic systolic (congestive) heart failure: Secondary | ICD-10-CM | POA: Insufficient documentation

## 2023-07-30 DIAGNOSIS — Z87891 Personal history of nicotine dependence: Secondary | ICD-10-CM | POA: Diagnosis not present

## 2023-07-30 DIAGNOSIS — I255 Ischemic cardiomyopathy: Secondary | ICD-10-CM | POA: Diagnosis not present

## 2023-07-30 DIAGNOSIS — I447 Left bundle-branch block, unspecified: Secondary | ICD-10-CM | POA: Insufficient documentation

## 2023-07-30 DIAGNOSIS — R739 Hyperglycemia, unspecified: Secondary | ICD-10-CM

## 2023-07-30 DIAGNOSIS — Z955 Presence of coronary angioplasty implant and graft: Secondary | ICD-10-CM | POA: Diagnosis not present

## 2023-07-30 DIAGNOSIS — I2510 Atherosclerotic heart disease of native coronary artery without angina pectoris: Secondary | ICD-10-CM | POA: Insufficient documentation

## 2023-07-30 DIAGNOSIS — E1159 Type 2 diabetes mellitus with other circulatory complications: Secondary | ICD-10-CM

## 2023-07-30 DIAGNOSIS — Z951 Presence of aortocoronary bypass graft: Secondary | ICD-10-CM | POA: Insufficient documentation

## 2023-07-30 DIAGNOSIS — Z79899 Other long term (current) drug therapy: Secondary | ICD-10-CM | POA: Insufficient documentation

## 2023-07-30 DIAGNOSIS — I252 Old myocardial infarction: Secondary | ICD-10-CM | POA: Insufficient documentation

## 2023-07-30 DIAGNOSIS — N183 Chronic kidney disease, stage 3 unspecified: Secondary | ICD-10-CM | POA: Diagnosis not present

## 2023-07-30 LAB — ECHOCARDIOGRAM COMPLETE
AR max vel: 3 cm2
AV Peak grad: 5.3 mmHg
Ao pk vel: 1.15 m/s
Area-P 1/2: 3.77 cm2
S' Lateral: 5.6 cm

## 2023-07-30 LAB — HEMOGLOBIN A1C
Hgb A1c MFr Bld: 6.3 % — ABNORMAL HIGH (ref 4.8–5.6)
Mean Plasma Glucose: 134.11 mg/dL

## 2023-07-30 LAB — BASIC METABOLIC PANEL
Anion gap: 9 (ref 5–15)
BUN: 18 mg/dL (ref 8–23)
CO2: 27 mmol/L (ref 22–32)
Calcium: 9.4 mg/dL (ref 8.9–10.3)
Chloride: 102 mmol/L (ref 98–111)
Creatinine, Ser: 1.78 mg/dL — ABNORMAL HIGH (ref 0.61–1.24)
GFR, Estimated: 41 mL/min — ABNORMAL LOW (ref >60)
Glucose, Bld: 88 mg/dL (ref 70–99)
Potassium: 4.3 mmol/L (ref 3.5–5.1)
Sodium: 138 mmol/L (ref 135–145)

## 2023-07-30 LAB — BRAIN NATRIURETIC PEPTIDE: B Natriuretic Peptide: 292.5 pg/mL — ABNORMAL HIGH (ref 0.0–100.0)

## 2023-07-30 MED ORDER — METOPROLOL SUCCINATE ER 50 MG PO TB24: 75.0000 mg | ORAL_TABLET | Freq: Every day | ORAL | 3 refills | Status: DC

## 2023-07-30 NOTE — Progress Notes (Signed)
Echocardiogram 2D Echocardiogram has been performed.  Geremiah Fussell N Dore Oquin,RDCS 07/30/2023, 1:27 PM

## 2023-07-30 NOTE — Patient Instructions (Signed)
Medication Changes:  Increase Metoprolol XL to 75 mg (1 & 1/2 tabs) Daily  Lab Work:  Labs done today, your results will be available in MyChart, we will contact you for abnormal readings.  Special Instructions // Education:  Do the following things EVERYDAY: Weigh yourself in the morning before breakfast. Write it down and keep it in a log. Take your medicines as prescribed Eat low salt foods--Limit salt (sodium) to 2000 mg per day.  Stay as active as you can everyday Limit all fluids for the day to less than 2 liters   Follow-Up in: 3 months  At the [facility], you and your health needs are our priority. We have a designated team specialized in the treatment of Heart Failure. This Care Team includes your primary Heart Failure Specialized Cardiologist (physician), Advanced Practice Providers (APPs- Physician Assistants and Nurse Practitioners), and Pharmacist who all work together to provide you with the care you need, when you need it.   You may see any of the following providers on your designated Care Team at your next follow up:  Dr. Arvilla Meres Dr. Marca Ancona Dr. Marcos Eke, NP Robbie Lis, [place] [facility], [place] Brynda Peon, NP Karle Plumber, PharmD   Please be sure to bring in all your medications bottles to every appointment.   Need to Contact us:  If you have any questions or concerns before your next appointment please send [place] a message through [place] or call our office at [phone].    TO LEAVE A MESSAGE FOR THE NURSE SELECT OPTION 2, PLEASE LEAVE A MESSAGE INCLUDING: YOUR NAME DATE OF BIRTH CALL BACK NUMBER REASON FOR CALL**this is important as we prioritize the call backs  YOU WILL RECEIVE A CALL BACK THE SAME DAY AS LONG AS YOU CALL BEFORE 4:00 PM

## 2023-07-30 NOTE — Progress Notes (Signed)
PCP: Dr Para March  Primary Cardiologist: Dr Shirlee Latch   HPI: [name] is a 67 y.o. with a history of  CAD, MI 2004 DES x2 totally occluded CFX (infarct-related artery) and 80% mRCA stenosis with taxus DES to both vessels, hyperlipidemia. Echo in 2011 showed EF 40-45%. He does not drink alcohol. Retired from [employer].    Admitted 03/20/21 for scheduled cath. Cath showed multivessel, PCWP 30, and cardiac index 1.7. Echo showed EF 20-25%.  CT surgery consulted. Prior to surgery diuresed with IV lasix.  Had CABG x3. Post op course complicated by AKI. Drips slowly weaned off. GDMT limited by AKI.   Echo in 10/22 showed EF 30%, mild LV dilation/mild LVH, mildly decreased RV systolic function. Echo in 10/23 showed EF 25-30%, mild LV dilation with septal-lateral dyssynchrony, mildly decreased RV systolic function, normal IVC.   Echo was done today and reviewed, EF 25-30% with mild LV dilation and septal-lateral dyssynchrony, normal RV size with mildly decreased systolic function.   Today he returns for HF follow up.  Weight down 4 lbs.  He continues to work out at [employer] regularly.  He can run on the treadmill.  No significant exertional dyspnea.  No chest pain.  No lightheadedness.  No orthopnea/PND.   ECG (personally reviewed): NSR, IVCD 146 msec  Labs (6/22): LDL 50 Labs (7/22): K 4.2, creatinine 1.47 Labs (8/22): K 3.7, creatinine 1.5 Labs (9/22): K 4.4, creatinine 1.66 Labs (6/23): K 3.9, creatinine 1.6 Labs (1/24): K 3.7, creatinine 1.59, BNP 363 Labs (6/24): K 4.4, creatinine 1.63  PMH: 1. CAD:  - MI 2004 with DES to occluded LCx and DES to [facility].  - LHC 4/22 with 3VD.  - CABG (4/22): LIMA-LAD, SVG-OM, SVG-PDA.  2. CKD: Stage 3.  3. H/o HCV 4. Hyperlipidemia 5. Chronic systolic CHF: Ischemic cardiomyopathy.  - Echo (2011): EF 40-45%.  - Echo (4/22): EF 20-25%, normal RV systolic function.  - RHC (4/22): mean PCWP 30, CI 1.7 - Echo (10/22): EF 30%, mild LV dilation/mild LVH, mildly  decreased RV systolic function. - Echo (10/23): EF 25-30%, mild LV dilation with septal-lateral dyssynchrony, mildly decreased RV systolic function, normal IVC.  - Echo (8/24): EF 25-30% with mild LV dilation and septal-lateral dyssynchrony, normal RV size with mildly decreased systolic function.  ROS: All systems negative except as listed in HPI, PMH and Problem List.  Social History   Socioeconomic History   Marital status: Legally Separated    Spouse name: Guilford Sproles   Number of children: 3   Years of education: 16   Highest education level: Not on file  Occupational History   Occupation: Retired  Tobacco Use   Smoking status: Former    Current packs/day: 0.00    Average packs/day: 1 pack/day for 10.0 years (10.0 ttl pk-yrs)    Types: Cigarettes    Start date: 12/10/1993    Quit date: 12/11/2003    Years since quitting: 19.6   Smokeless tobacco: Never  Vaping Use   Vaping status: Never Used  Substance and Sexual Activity   Alcohol use: No   Drug use: No   Sexual activity: Not on file  Other Topics Concern   Not on file  Social History Narrative   Left handed    Married, 1980   Lives in [place]   3 children   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   Likes bass fishing   Caffeine: 2cups coffee or less per day  Social Determinants of Health   Financial Resource Strain: Not on file  Food Insecurity: Not on file  Transportation Needs: Not on file  Physical Activity: Not on file  Stress: Not on file  Social Connections: Not on file  Intimate Partner Violence: Not on file    Family History  Problem Relation Age of Onset   Aneurysm Father        brain   Stroke Father    Other Mother        ASCVD   Hypertension Mother    Heart attack Other        uncle   Diabetes Child    Diabetes Child    Colon cancer Neg Hx    Prostate cancer Neg Hx     Current Outpatient Medications  Medication Sig Dispense Refill   aspirin 81 MG EC tablet Take 1  tablet (81 mg total) by mouth daily.     atorvastatin (LIPITOR) 80 MG tablet Take 1 tablet by mouth once daily 90 tablet 3   digoxin (LANOXIN) 0.125 MG tablet Take 1 tablet by mouth once daily 90 tablet 3   eplerenone (INSPRA) 50 MG tablet Take 1 tablet (50 mg total) by mouth daily. 90 tablet 3   FARXIGA 10 MG TABS tablet TAKE 1 TABLET BY MOUTH ONCE DAILY BEFORE BREAKFAST 30 tablet 11   furosemide (LASIX) 20 MG tablet Take 1 tablet (20 mg total) by mouth daily. 90 tablet 3   loratadine (CLARITIN) 10 MG tablet Take 10 mg by mouth daily.     Multiple Vitamin (MULTIVITAMIN WITH MINERALS) TABS tablet Take 1 tablet by mouth daily.     sacubitril-valsartan (ENTRESTO) 49-51 MG Take 1 tablet by mouth 2 (two) times daily. 180 tablet 3   Semaglutide,0.25 or 0.5MG /DOS, (OZEMPIC, 0.25 OR 0.5 MG/DOSE,) 2 MG/3ML SOPN Inject 0.25 mg once weekly for 4 weeks, then increase to 0.5 mg once weekly if tolerated. 3 mL 2   metoprolol succinate (TOPROL-XL) 50 MG 24 hr tablet Take 1.5 tablets (75 mg total) by mouth daily. 135 tablet 3   No current facility-administered medications for this encounter.    Vitals:   07/30/23 1351  BP: 104/70  Pulse: 66  SpO2: 100%  Weight: 97 kg (213 lb 12.8 oz)   Wt Readings from Last 3 Encounters:  07/30/23 97 kg (213 lb 12.8 oz)  06/21/23 99.3 kg (219 lb)  04/29/23 98.6 kg (217 lb 6.4 oz)    PHYSICAL EXAM: General: NAD Neck: No JVD, no thyromegaly or thyroid nodule.  Lungs: Clear to auscultation bilaterally with normal respiratory effort. CV: Nondisplaced PMI.  Heart regular S1/S2, no S3/S4, no murmur.  No peripheral edema.  No carotid bruit.  Normal pedal pulses.  Abdomen: Soft, nontender, no hepatosplenomegaly, no distention.  Skin: Intact without lesions or rashes.  Neurologic: Alert and oriented x 3.  Psych: Normal affect. Extremities: No clubbing or cyanosis.  HEENT: Normal.   ASSESSMENT & PLAN: 1. CAD: Had MI in 2004 with DESx2 with totally occluded CFX  (infarct-related artery) and 80% mRCA stenosis.  Patient had Taxus DES to both vessels.  In 4/22, noted to have EF down to 20-25% on echo, set up for coronary angiography. LHC 4/22 showed high-grade subtotal stenoses of the LAD, left circumflex, and total occlusion of the previously placed stents in the circumflex marginal and RCA.  CMRI with substantial viability.  03/10/21 patient had CABG with LIMA-LAD, SVG-OM, SVG-PDA.  No chest pain.   - Continue ASA 81  -  Continue atorvastatin 80 daily.    2. Chronic systolic CHF:  Ischemic cardiomyopathy.  Echo in 4/22 with EF 20-25%, severe LV dilation, mildly dilated RV with normal systolic function, moderate MR.  RHC 4/22 with elevated PCWP and CI 1.7.  Patient had CABG, has been doing well since.  Echo in 10/22 with EF 30%, echo 10/23 showed EF 25-30%, mild LV dilation, septal-lateral dyssynchrony, mild RV dysfunction. Echo today showed EF 25-30% with mild LV dilation and septal-lateral dyssynchrony, normal RV size with mildly decreased systolic function.  NYHA class I symptoms.  He is not volume overloaded, weight down 4 lbs.  - Continue Entresto 49/51 bid  - Continue Lasix 20 mg daily.  BMET/BNP today.  - Continue Farxiga 10 mg daily.   - Increase Toprol XL to 75 mg daily mg daily.    - continue digoxin 0.125, check level today.  - Continue eplerenone 50 mg daily.  - EF 25-30% on echo with LBBB-like IVCD 146 msec on today's ECG.  With dyssynchrony, I have recommended CRT-D.  He has seen EP who also recommended CRT-D device.  We discussed again today, he is still not interested in implanted device and understands why I recommend it.  3. CKD stage 3: BMET today.  4. Hyperlipidemia: Atorvastatin 80 daily.   Followup in 3 months with APP  Marca Ancona 07/30/2023

## 2023-07-31 ENCOUNTER — Other Ambulatory Visit: Payer: Self-pay | Admitting: Family Medicine

## 2023-07-31 MED ORDER — OZEMPIC (0.25 OR 0.5 MG/DOSE) 2 MG/3ML ~~LOC~~ SOPN: PEN_INJECTOR | SUBCUTANEOUS | 2 refills | Status: DC

## 2023-08-08 ENCOUNTER — Other Ambulatory Visit (HOSPITAL_COMMUNITY): Payer: Self-pay | Admitting: Family Medicine

## 2023-08-26 ENCOUNTER — Telehealth: Payer: Self-pay | Admitting: Family Medicine

## 2023-08-26 NOTE — Telephone Encounter (Signed)
Pt called saying he was told by his pharmacy that they cannot refill his Ozempic meds until insurance approves it. Pt states he was instructed to contact our office to get in touch with his insurance. Call back # [phone]

## 2023-08-26 NOTE — Telephone Encounter (Signed)
Patient needs PA done

## 2023-08-27 ENCOUNTER — Other Ambulatory Visit (HOSPITAL_COMMUNITY): Payer: Self-pay

## 2023-08-27 NOTE — Telephone Encounter (Signed)
Per test claim, PA shouldn't be required through pt's primary insurance, as medication should be covered at $47.00

## 2023-08-28 NOTE — Telephone Encounter (Signed)
Spoke with pharmacy relaying info from PA team. However, when Jordan Hawks tries to run it through, their system still shows a PA is needed. But they can see pt was able to get rx last month with no problem. Says they will continue to work on it and will contact pt if able to resolve the issue.   Attempted to contact pt. No answer, no vm. Need to relay info above and suggest pt contact insurance co to see maybe what the issue is.

## 2023-08-29 NOTE — Telephone Encounter (Signed)
Pt called back returning Lisa's call. Pt states he'll contact pharmacy for update. Call back # [phone]

## 2023-09-02 NOTE — Telephone Encounter (Signed)
Called and spoke with [facility] pharmacy and after speaking with them they were filing the wrong insurance and was able to get correct one in and filed for patients medication to get filled. Called and let patient know they are getting rx ready for pick up.

## 2023-09-02 NOTE — Telephone Encounter (Signed)
Pt called back stating he is still being told by pharmacy that a PA is needed for his ozempic. Pt states he is out of meds. Please advise. Call back # [phone]

## 2023-09-02 NOTE — Telephone Encounter (Signed)
Pt called stating he spoke to his pharmacy & was told they haven't spoke to anyone from our office regarding ozempic. Pt states he is now completely out of meds. Please advise. Call back # [phone]

## 2023-09-03 ENCOUNTER — Telehealth: Payer: Self-pay | Admitting: Family Medicine

## 2023-09-03 NOTE — Telephone Encounter (Signed)
Patient would like to know if his ozempic is suppose to be increased from the 0.5 mg? He said that he has been on this dosage for 3 months and is wondering if its suppose to increase now?Please advise.

## 2023-09-04 NOTE — Telephone Encounter (Signed)
LMTCB

## 2023-09-04 NOTE — Telephone Encounter (Signed)
Is he tolerating the 0.5 mg dose?  How is his sugar running?  If tolerating the medication without low sugars then I would continue as is based on his most recent A1c of 6.3.  Thanks.

## 2023-09-06 NOTE — Telephone Encounter (Signed)
I would like to recheck his A1c at an office visit in about 3 months.  Please schedule.  I wouldn't change his meds in the meantime unless he had sig sugar variation, ie low sugars.  Thanks.

## 2023-09-06 NOTE — Telephone Encounter (Signed)
Lvm for patient tcb and schedule 

## 2023-09-06 NOTE — Telephone Encounter (Signed)
Called patient he is able to take the 0.5 with no side effects. He is not checking his readings at this time but denies any symptoms of low or high readings.   Wanted to know when it will need to be increased. Advised we will reach out after we get information form his provider.

## 2023-09-30 ENCOUNTER — Other Ambulatory Visit (HOSPITAL_COMMUNITY): Payer: Self-pay | Admitting: Cardiology

## 2023-09-30 MED ORDER — DAPAGLIFLOZIN PROPANEDIOL 10 MG PO TABS: 10.0000 mg | ORAL_TABLET | Freq: Every day | ORAL | 11 refills | Status: DC

## 2023-09-30 MED ORDER — DIGOXIN 125 MCG PO TABS: 125.0000 ug | ORAL_TABLET | Freq: Every day | ORAL | 3 refills | Status: DC

## 2023-10-01 ENCOUNTER — Encounter: Payer: Self-pay | Admitting: Family Medicine

## 2023-10-01 ENCOUNTER — Ambulatory Visit: Payer: Medicare Other | Admitting: Family Medicine

## 2023-10-01 VITALS — BP 122/70 | HR 70 | Temp 98.0°F | Ht 69.0 in | Wt 213.4 lb

## 2023-10-01 DIAGNOSIS — N529 Male erectile dysfunction, unspecified: Secondary | ICD-10-CM | POA: Diagnosis not present

## 2023-10-01 DIAGNOSIS — Z23 Encounter for immunization: Secondary | ICD-10-CM | POA: Diagnosis not present

## 2023-10-01 DIAGNOSIS — Z7985 Long-term (current) use of injectable non-insulin antidiabetic drugs: Secondary | ICD-10-CM | POA: Diagnosis not present

## 2023-10-01 DIAGNOSIS — E1159 Type 2 diabetes mellitus with other circulatory complications: Secondary | ICD-10-CM | POA: Diagnosis not present

## 2023-10-01 LAB — POCT GLYCOSYLATED HEMOGLOBIN (HGB A1C): Hemoglobin A1C: 5.8 % — AB (ref 4.0–5.6)

## 2023-10-01 LAB — MICROALBUMIN / CREATININE URINE RATIO
Creatinine,U: 31.2 mg/dL
Microalb Creat Ratio: 2.2 mg/g (ref 0.0–30.0)
Microalb, Ur: <0.7 mg/dL (ref 0.0–1.9)

## 2023-10-01 MED ORDER — FUROSEMIDE 20 MG PO TABS: 20.0000 mg | ORAL_TABLET | Freq: Every day | ORAL | Status: DC | PRN

## 2023-10-01 MED ORDER — OZEMPIC (0.25 OR 0.5 MG/DOSE) 2 MG/3ML ~~LOC~~ SOPN: PEN_INJECTOR | SUBCUTANEOUS | 4 refills | Status: DC

## 2023-10-01 NOTE — Progress Notes (Unsigned)
Diabetes:  Using medications without difficulties:yes Hypoglycemic episodes: no sx Hyperglycemic episodes: no sx Feet problems: some tingling in the feet at night, mild.  Unclear if positional.   Blood Sugars averaging: not checked.   eye exam within last year: d/w pt, due.   A1c 5.8.   Had been taking ozempic 0.5mg  daily.    ED noted.  I asked him to check with Dr. Shirlee Latch.  Would defer testosterone check at this point as it would likely not change the plan.  I need to check with [facility] about ED.    Taking lasix 20mg  every day prn. Med list updated.    Meds, vitals, and allergies reviewed.   ROS: Per HPI unless specifically indicated in ROS section   GEN: nad, alert and oriented HEENT: ncat NECK: supple w/o LA CV: rrr. PULM: ctab, no inc wob ABD: soft, +bs EXT: no edema SKIN: no acute rash  Diabetic foot exam: Normal inspection No skin breakdown No calluses  Normal DP pulses Normal sensation to light touch and monofilament Nails normal

## 2023-10-01 NOTE — Patient Instructions (Addendum)
Go to the lab on the way out.   If you have mychart we'll likely use that to update you.    Keep taking ozempic and recheck A1c at a visit in about 3 months.  You don't need to fast.   Take care.  Glad to see you.

## 2023-10-02 ENCOUNTER — Telehealth: Payer: Self-pay | Admitting: Family Medicine

## 2023-10-02 DIAGNOSIS — N529 Male erectile dysfunction, unspecified: Secondary | ICD-10-CM | POA: Insufficient documentation

## 2023-10-02 NOTE — Telephone Encounter (Signed)
Please start auth for patient

## 2023-10-02 NOTE — Assessment & Plan Note (Signed)
A1c significantly better.  Discussed pros and cons of stopping Ozempic.  Would continue for now.  Check microalbumin.  Update me as needed in the meantime. Recheck A1c at a visit in about 3 months.

## 2023-10-02 NOTE — Assessment & Plan Note (Signed)
I am not enthused about checking his testosterone level as I would be hesitant to treat him with testosterone even if he had a low level.  Discussed with patient.  Unclear how much of his ED is med/relative hypotension related I am going to ask for cardiology input about any potential med reduction given his blood pressure 122/70.  I did not make any changes in medication today.  I thank all involved.

## 2023-10-02 NOTE — Telephone Encounter (Signed)
Pt called in and stated that the pharmacy told him that Dr. Para March needs to call the ins company and tell them the reason why pt needs the Semaglutide,0.25 or 0.5MG /DOS, (OZEMPIC, 0.25 OR 0.5 MG/DOSE,) 2 MG/3ML SOPN that's the only way the ins company will approve the Ozempic pt would like a call back if possible

## 2023-10-04 ENCOUNTER — Other Ambulatory Visit (HOSPITAL_COMMUNITY): Payer: Self-pay

## 2023-10-04 ENCOUNTER — Telehealth: Payer: Self-pay

## 2023-10-04 NOTE — Telephone Encounter (Signed)
Information sent in my chart message.  No further action needed at this time.

## 2023-10-04 NOTE — Telephone Encounter (Signed)
Pharmacy Patient Advocate Encounter   Received notification from Pt Calls Messages that prior authorization for Ozempic (0.25 or 0.5 MG/DOSE) 2MG /3ML pen-injectors is required/requested.   Insurance verification completed.   The patient is insured through [facility] .   Per test claim: PA required; PA submitted to [facility] via CoverMyMeds Key/confirmation #/EOC [id number] Status is pending

## 2023-10-04 NOTE — Telephone Encounter (Signed)
Tried to call pt to let him know. No answer. No vm.

## 2023-10-04 NOTE — Telephone Encounter (Signed)
Pharmacy Patient Advocate Encounter  Received notification from [facility] that Prior Authorization for Ozempic (0.25 or 0.5 MG/DOSE) 2MG /3ML pen-injectors has been APPROVED from 10/04/23 to 10/03/26   PA #/Case ID/Reference #: [id number]

## 2023-10-04 NOTE — Telephone Encounter (Signed)
PA request has been Submitted. New Encounter created for follow up. For additional info see Pharmacy Prior Auth telephone encounter from 10/04/23.

## 2023-10-18 ENCOUNTER — Other Ambulatory Visit (HOSPITAL_COMMUNITY): Payer: Self-pay | Admitting: Family Medicine

## 2023-10-30 ENCOUNTER — Encounter (HOSPITAL_COMMUNITY): Payer: Self-pay

## 2023-10-30 ENCOUNTER — Ambulatory Visit (HOSPITAL_COMMUNITY)
Admission: RE | Admit: 2023-10-30 | Discharge: 2023-10-30 | Disposition: A | Discharge status: Home / Self Care | Payer: Medicare Other | Source: Ambulatory Visit | Attending: Family Medicine

## 2023-10-30 VITALS — BP 120/84 | HR 89 | Wt 215.0 lb

## 2023-10-30 DIAGNOSIS — I2510 Atherosclerotic heart disease of native coronary artery without angina pectoris: Secondary | ICD-10-CM | POA: Insufficient documentation

## 2023-10-30 DIAGNOSIS — N1831 Chronic kidney disease, stage 3a: Secondary | ICD-10-CM | POA: Diagnosis not present

## 2023-10-30 DIAGNOSIS — R5383 Other fatigue: Secondary | ICD-10-CM | POA: Insufficient documentation

## 2023-10-30 DIAGNOSIS — Z79899 Other long term (current) drug therapy: Secondary | ICD-10-CM | POA: Insufficient documentation

## 2023-10-30 DIAGNOSIS — N183 Chronic kidney disease, stage 3 unspecified: Secondary | ICD-10-CM | POA: Insufficient documentation

## 2023-10-30 DIAGNOSIS — I5022 Chronic systolic (congestive) heart failure: Secondary | ICD-10-CM | POA: Diagnosis not present

## 2023-10-30 DIAGNOSIS — Z955 Presence of coronary angioplasty implant and graft: Secondary | ICD-10-CM | POA: Diagnosis not present

## 2023-10-30 DIAGNOSIS — Z87891 Personal history of nicotine dependence: Secondary | ICD-10-CM | POA: Insufficient documentation

## 2023-10-30 DIAGNOSIS — I252 Old myocardial infarction: Secondary | ICD-10-CM | POA: Diagnosis not present

## 2023-10-30 DIAGNOSIS — E669 Obesity, unspecified: Secondary | ICD-10-CM | POA: Diagnosis not present

## 2023-10-30 DIAGNOSIS — E785 Hyperlipidemia, unspecified: Secondary | ICD-10-CM | POA: Diagnosis not present

## 2023-10-30 DIAGNOSIS — I255 Ischemic cardiomyopathy: Secondary | ICD-10-CM | POA: Diagnosis not present

## 2023-10-30 DIAGNOSIS — R400 Somnolence: Secondary | ICD-10-CM | POA: Insufficient documentation

## 2023-10-30 LAB — IRON AND TIBC
Iron: 143 ug/dL (ref 45–182)
Saturation Ratios: 39 % (ref 17.9–39.5)
TIBC: 370 ug/dL (ref 250–450)
UIBC: 227 ug/dL

## 2023-10-30 LAB — BASIC METABOLIC PANEL
Anion gap: 10 (ref 5–15)
BUN: 15 mg/dL (ref 8–23)
CO2: 25 mmol/L (ref 22–32)
Calcium: 9.5 mg/dL (ref 8.9–10.3)
Chloride: 107 mmol/L (ref 98–111)
Creatinine, Ser: 1.71 mg/dL — ABNORMAL HIGH (ref 0.61–1.24)
GFR, Estimated: 43 mL/min — ABNORMAL LOW (ref >60)
Glucose, Bld: 107 mg/dL — ABNORMAL HIGH (ref 70–99)
Potassium: 4.1 mmol/L (ref 3.5–5.1)
Sodium: 142 mmol/L (ref 135–145)

## 2023-10-30 LAB — DIGOXIN LEVEL: Digoxin Level: 0.7 ng/mL — ABNORMAL LOW (ref 0.8–2.0)

## 2023-10-30 LAB — CBC
HCT: 53.1 % — ABNORMAL HIGH (ref 39.0–52.0)
Hemoglobin: 17.7 g/dL — ABNORMAL HIGH (ref 13.0–17.0)
MCH: 30.3 pg (ref 26.0–34.0)
MCHC: 33.3 g/dL (ref 30.0–36.0)
MCV: 90.8 fL (ref 80.0–100.0)
Platelets: 129 10*3/uL — ABNORMAL LOW (ref 150–400)
RBC: 5.85 MIL/uL — ABNORMAL HIGH (ref 4.22–5.81)
RDW: 12.6 % (ref 11.5–15.5)
WBC: 4.3 10*3/uL (ref 4.0–10.5)
nRBC: 0 % (ref 0.0–0.2)

## 2023-10-30 LAB — FERRITIN: Ferritin: 70 ng/mL (ref 24–336)

## 2023-10-30 LAB — BRAIN NATRIURETIC PEPTIDE: B Natriuretic Peptide: 252.8 pg/mL — ABNORMAL HIGH (ref 0.0–100.0)

## 2023-10-30 LAB — TSH: TSH: 1.221 u[IU]/mL (ref 0.350–4.500)

## 2023-10-30 NOTE — Patient Instructions (Addendum)
Thank you for coming in today  If you had labs drawn today, any labs that are abnormal the clinic will call you No news is good news  Medications: No changes  Follow up appointments:  Your physician recommends that you schedule a follow-up appointment in:  4 months With Dr. Earlean Shawl will receive a reminder letter in the mail a few months in advance. If you don't receive a letter, please call our office to schedule the follow-up appointment.    Do the following things EVERYDAY: Weigh yourself in the morning before breakfast. Write it down and keep it in a log. Take your medicines as prescribed Eat low salt foods--Limit salt (sodium) to 2000 mg per day.  Stay as active as you can everyday Limit all fluids for the day to less than 2 liters   At the [facility], you and your health needs are our priority. As part of our continuing mission to provide you with exceptional heart care, we have created designated Provider Care Teams. These Care Teams include your primary Cardiologist (physician) and Advanced Practice Providers (APPs- Physician Assistants and Nurse Practitioners) who all work together to provide you with the care you need, when you need it.   You may see any of the following providers on your designated Care Team at your next follow up: Dr Arvilla Meres Dr Marca Ancona Dr. Marcos Eke, NP Robbie Lis, [place] [facility] [place], [place] Brynda Peon, NP Karle Plumber, PharmD   Please be sure to bring in all your medications bottles to every appointment.    Thank you for choosing [facility]  If you have any questions or concerns before your next appointment please send [place] a message through [place] or call our office at [phone].    TO LEAVE A MESSAGE FOR THE NURSE SELECT OPTION 2, PLEASE LEAVE A MESSAGE INCLUDING: YOUR NAME DATE OF BIRTH CALL BACK NUMBER REASON FOR CALL**this  is important as we prioritize the call backs  YOU WILL RECEIVE A CALL BACK THE SAME DAY AS LONG AS YOU CALL BEFORE 4:00 PM

## 2023-10-30 NOTE — Progress Notes (Addendum)
PCP: Dr. Para March  Primary Cardiologist: Dr Shirlee Latch   HPI: [name] is a 67 y.o. with a history of  CAD, MI 2004 DES x2 totally occluded CFX (infarct-related artery) and 80% mRCA stenosis with taxus DES to both vessels, hyperlipidemia. Echo in 2011 showed EF 40-45%. He does not drink alcohol. Retired from [employer].    Admitted 03/20/21 for scheduled cath. Cath showed multivessel, PCWP 30, and cardiac index 1.7. Echo showed EF 20-25%.  CT surgery consulted. Prior to surgery diuresed with IV lasix.  Had CABG x3. Post op course complicated by AKI. Drips slowly weaned off. GDMT limited by AKI.   Echo in 10/22 showed EF 30%, mild LV dilation/mild LVH, mildly decreased RV systolic function. Echo in 10/23 showed EF 25-30%, mild LV dilation with septal-lateral dyssynchrony, mildly decreased RV systolic function, normal IVC.   Echo 8/24, EF 25-30% with mild LV dilation and septal-lateral dyssynchrony, normal RV size with mildly decreased systolic function.   Today he returns for HF follow up. Overall feeling fine. He thinks his T levels are low, having hot flashes and low libido. He has generalized fatigue, but able to work out and not limited physically by dyspnea. He is not SOB lifting weights or walking up a flight of steps. Denies palpitations, abnormal bleeding, CP, dizziness, edema, or PND/Orthopnea. Appetite ok. No fever or chills. Weight at home 211 pounds. Taking all medications. He is unsure if he snores.  ECG (personally reviewed): NSR, IVCD QRS 144 msec,  inferior TWI (old)  Labs (6/22): LDL 50 Labs (7/22): K 4.2, creatinine 1.47 Labs (8/22): K 3.7, creatinine 1.5 Labs (9/22): K 4.4, creatinine 1.66 Labs (6/23): K 3.9, creatinine 1.6 Labs (1/24): K 3.7, creatinine 1.59, BNP 363 Labs (6/24): K 4.4, creatinine 1.63, LDL 64 Labs (8/24): K 4.3, creatinine 1.78  PMH: 1. CAD:  - MI 2004 with DES to occluded LCx and DES to [facility].  - LHC 4/22 with 3VD.  - CABG (4/22): LIMA-LAD, SVG-OM,  SVG-PDA.  2. CKD: Stage 3.  3. H/o HCV 4. Hyperlipidemia 5. Chronic systolic CHF: Ischemic cardiomyopathy.  - Echo (2011): EF 40-45%.  - Echo (4/22): EF 20-25%, normal RV systolic function.  - RHC (4/22): mean PCWP 30, CI 1.7 - Echo (10/22): EF 30%, mild LV dilation/mild LVH, mildly decreased RV systolic function. - Echo (10/23): EF 25-30%, mild LV dilation with septal-lateral dyssynchrony, mildly decreased RV systolic function, normal IVC.  - Echo (8/24): EF 25-30% with mild LV dilation and septal-lateral dyssynchrony, normal RV size with mildly decreased systolic function.  ROS: All systems negative except as listed in HPI, PMH and Problem List.  Social History   Socioeconomic History   Marital status: Legally Separated    Spouse name: Armahni Muckelroy   Number of children: 3   Years of education: 16   Highest education level: Not on file  Occupational History   Occupation: Retired  Tobacco Use   Smoking status: Former    Current packs/day: 0.00    Average packs/day: 1 pack/day for 10.0 years (10.0 ttl pk-yrs)    Types: Cigarettes    Start date: 12/10/1993    Quit date: 12/11/2003    Years since quitting: 19.8   Smokeless tobacco: Never  Vaping Use   Vaping status: Never Used  Substance and Sexual Activity   Alcohol use: No   Drug use: No   Sexual activity: Not on file  Other Topics Concern   Not on file  Social History Narrative  Left handed    Married, 1980   Lives in [place]   3 children   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   Likes bass fishing   Caffeine: 2cups coffee or less per day   Social Determinants of [employer] investment [employer] Strain: Not on file  Food Insecurity: Not on file  Transportation Needs: Not on file  Physical Activity: Not on file  Stress: Not on file  Social Connections: Not on file  Intimate Partner Violence: Not on file    Family History  Problem Relation Age of Onset   Aneurysm Father        brain    Stroke Father    Other Mother        ASCVD   Hypertension Mother    Heart attack Other        uncle   Diabetes Child    Diabetes Child    Colon cancer Neg Hx    Prostate cancer Neg Hx    Current Outpatient Medications  Medication Sig Dispense Refill   aspirin 81 MG EC tablet Take 1 tablet (81 mg total) by mouth daily.     atorvastatin (LIPITOR) 80 MG tablet Take 1 tablet by mouth once daily 90 tablet 3   dapagliflozin propanediol (FARXIGA) 10 MG TABS tablet Take 1 tablet (10 mg total) by mouth daily before breakfast. 30 tablet 11   digoxin (LANOXIN) 0.125 MG tablet Take 1 tablet (125 mcg total) by mouth daily. 90 tablet 3   eplerenone (INSPRA) 50 MG tablet Take 1 tablet (50 mg total) by mouth daily. 90 tablet 3   furosemide (LASIX) 20 MG tablet Take 1 tablet (20 mg total) by mouth daily as needed. (Patient taking differently: Take 20 mg by mouth daily.)     loratadine (CLARITIN) 10 MG tablet Take 10 mg by mouth daily.     metoprolol succinate (TOPROL-XL) 50 MG 24 hr tablet Take 1 tablet by mouth once daily 90 tablet 0   Multiple Vitamin (MULTIVITAMIN WITH MINERALS) TABS tablet Take 1 tablet by mouth daily.     sacubitril-valsartan (ENTRESTO) 49-51 MG Take 1 tablet by mouth 2 (two) times daily. 180 tablet 3   Semaglutide,0.25 or 0.5MG /DOS, (OZEMPIC, 0.25 OR 0.5 MG/DOSE,) 2 MG/3ML SOPN Inject 0.5 mg once weekly 3 mL 4   No current facility-administered medications for this encounter.   BP 120/84   Pulse 89   Wt 97.5 kg (215 lb)   SpO2 96%   BMI 31.75 kg/m   Wt Readings from Last 3 Encounters:  10/30/23 97.5 kg (215 lb)  10/01/23 96.8 kg (213 lb 6.4 oz)  07/30/23 97 kg (213 lb 12.8 oz)   PHYSICAL EXAM: General:  NAD. No resp difficulty, walked into clinic HEENT: Normal Neck: Supple. No JVD. Carotids 2+ bilat; no bruits. No lymphadenopathy or thryomegaly appreciated. Cor: PMI nondisplaced. Regular rate & rhythm. No rubs, gallops or murmurs. Lungs: Clear Abdomen: Soft,  nontender, nondistended. No hepatosplenomegaly. No bruits or masses. Good bowel sounds. Extremities: No cyanosis, clubbing, rash, edema Neuro: Alert & oriented x 3, cranial nerves grossly intact. Moves all 4 extremities w/o difficulty. Affect pleasant.  ASSESSMENT & PLAN: 1. CAD: Had MI in 2004 with DESx2 with totally occluded CFX (infarct-related artery) and 80% mRCA stenosis.  Patient had Taxus DES to both vessels.  In 4/22, noted to have EF down to 20-25% on echo, set up for coronary angiography. LHC 4/22 showed high-grade subtotal stenoses of the  LAD, left circumflex, and total occlusion of the previously placed stents in the circumflex marginal and RCA.  CMRI with substantial viability.  03/10/21 patient had CABG with LIMA-LAD, SVG-OM, SVG-PDA.  No chest pain.   - Continue ASA 81  - Continue atorvastatin 80 daily.    2. Chronic systolic CHF:  Ischemic cardiomyopathy.  Echo in 4/22 with EF 20-25%, severe LV dilation, mildly dilated RV with normal systolic function, moderate MR.  RHC 4/22 with elevated PCWP and CI 1.7.  Patient had CABG, has been doing well since.  Echo in 10/22 with EF 30%, echo 10/23 showed EF 25-30%, mild LV dilation, septal-lateral dyssynchrony, mild RV dysfunction. Echo 8/24 showed EF 25-30% with mild LV dilation and septal-lateral dyssynchrony, normal RV size with mildly decreased systolic function.  NYHA class I-II symptoms, fatigue more prominent for past 2-3 months.  He is not volume overloaded. - Continue Entresto 49/51 bid.  - Continue Lasix 20 mg daily.  BMET/BNP today.  - Continue Farxiga 10 mg daily.   - Continue Toprol XL 75 mg daily.   - continue digoxin 0.125, check level today.  - Continue eplerenone 50 mg daily.  - With increased fatigue for past 2-3 months, we discussed CPX testing today to quantify HF limitation. Will hold off for now. Can re-visit. - EF 25-30% on echo with LBBB-like IVCD 146 msec on most recent ECG.  With dyssynchrony, I have recommended CRT-D.   He has seen EP who also recommended CRT-D device.  We discussed again today, he is still not interested in implanted device and understands why I recommend it.  3. CKD stage 3: baseline SCr 1.6  - Continue SGLT2i. BMET today.  4. Hyperlipidemia: Atorvastatin 80 daily.  5. Obesity: on semaglutide - Body mass index is 31.75 kg/m. 6. Fatigue: Likely multifactorial. He is concerned about low testosterone. - Check labs, including iron indices and TSH today. - Check home sleep study - he is asking about testosterone testing. Defer to PCP. If he does have documented hypogonadism, OK from CV standpoint for testosterone supplementation. Would watch CBC and Lipids closely. 7. Suspect sleep apnea: He is insure if he snores. With fatigue, will check home sleep study.  Follow up in 3 months with Dr. Kathreen Cornfield [facility] FNP-BC 10/30/2023

## 2023-11-19 ENCOUNTER — Ambulatory Visit: Payer: Medicare Other | Admitting: Family Medicine

## 2023-11-20 ENCOUNTER — Other Ambulatory Visit (HOSPITAL_COMMUNITY): Payer: Self-pay | Admitting: Cardiology

## 2023-11-22 ENCOUNTER — Encounter: Payer: Self-pay | Admitting: Family Medicine

## 2023-11-22 ENCOUNTER — Ambulatory Visit (INDEPENDENT_AMBULATORY_CARE_PROVIDER_SITE_OTHER): Payer: Medicare Other | Admitting: Family Medicine

## 2023-11-22 VITALS — BP 108/60 | HR 79 | Temp 98.4°F | Ht 69.0 in | Wt 215.8 lb

## 2023-11-22 DIAGNOSIS — N529 Male erectile dysfunction, unspecified: Secondary | ICD-10-CM

## 2023-11-22 MED ORDER — FUROSEMIDE 20 MG PO TABS: 20.0000 mg | ORAL_TABLET | Freq: Every day | ORAL | Status: DC

## 2023-11-22 NOTE — Patient Instructions (Signed)
Let me check on the sleep apnea test arrangements.  Ask for an early morning nonfasting lab visit next week.  Take care.  Glad to see you.

## 2023-11-22 NOTE — Progress Notes (Unsigned)
ED d/w pt.  He talked about cardiology about possible testosterone testing.  Cardiology notes reviewed.  He didn't have OSA testing yet.  D/w pt. of told him I would check on that.  Discussed that sleep apnea could affect other issues including his testosterone level.  Meds, vitals, and allergies reviewed.   ROS: Per HPI unless specifically indicated in ROS section   Nad Ncat Rrr Ctab Abd soft, not ttp Ext w/o edema.

## 2023-11-24 NOTE — Assessment & Plan Note (Addendum)
Return for early morning labs.  I am going to check about his pulmonary testing for sleep apnea.  Staff message sent to referral department.

## 2023-11-25 ENCOUNTER — Telehealth (HOSPITAL_COMMUNITY): Payer: Self-pay | Admitting: Cardiology

## 2023-11-25 NOTE — Telephone Encounter (Signed)
PCP CALLED TO CHECK THE STATUS OF SLEEP STUDY    ADVISED STAFF WOULD LOOK INTO PROCEDURE/AUTH   PER PRE CERT TEAM NO PRE CERT REQUIRED    PT OK TO PROCEED WITH HOME SLEEP STUDY  ATTEMPTED TO CONTACT PT NO ANSWER UNABLE TO LEAVE MESSAGE

## 2023-12-10 ENCOUNTER — Ambulatory Visit: Payer: Medicare Other

## 2023-12-10 NOTE — Telephone Encounter (Signed)
Number disconnected Will close encounter

## 2023-12-12 ENCOUNTER — Other Ambulatory Visit (INDEPENDENT_AMBULATORY_CARE_PROVIDER_SITE_OTHER): Payer: Medicare Other

## 2023-12-12 ENCOUNTER — Ambulatory Visit: Payer: Medicare Other

## 2023-12-12 VITALS — BP 118/68 | Ht 69.0 in | Wt 215.0 lb

## 2023-12-12 DIAGNOSIS — Z1211 Encounter for screening for malignant neoplasm of colon: Secondary | ICD-10-CM | POA: Diagnosis not present

## 2023-12-12 DIAGNOSIS — Z0000 Encounter for general adult medical examination without abnormal findings: Secondary | ICD-10-CM | POA: Diagnosis not present

## 2023-12-12 DIAGNOSIS — N529 Male erectile dysfunction, unspecified: Secondary | ICD-10-CM

## 2023-12-12 LAB — TESTOSTERONE: Testosterone: 265.21 ng/dL — ABNORMAL LOW (ref 300.00–890.00)

## 2023-12-12 LAB — LUTEINIZING HORMONE: LH: 2.29 m[IU]/mL (ref 1.50–9.30)

## 2023-12-12 NOTE — Patient Instructions (Addendum)
 [name] , Thank you for taking time to come for your Medicare Wellness Visit. I appreciate your ongoing commitment to your health goals. Please review the following plan we discussed and let me know if I can assist you in the future.   Referrals/Orders/Follow-Ups/Clinician Recommendations:   A referral for colonoscopy has been placed for you. Please call to schedule if you do not hear from them in 7 days.  [facility], [place]   Phone: [phone]   This is a list of the screening recommended for you and due dates:  Health Maintenance  Topic Date Due   Eye exam for diabetics  Never done   Colon Cancer Screening  Never done   Zoster (Shingles) Vaccine (1 of 2) Never done   DTaP/Tdap/Td vaccine (3 - Tdap) 08/04/2020   Pneumonia Vaccine (2 of 2 - PCV) 03/21/2022   COVID-19 Vaccine (3 - 2024-25 season) 08/11/2023   Hemoglobin A1C  03/31/2024   Yearly kidney health urinalysis for diabetes  09/30/2024   Complete foot exam   09/30/2024   Yearly kidney function blood test for diabetes  10/29/2024   Medicare Annual Wellness Visit  12/11/2024   Flu Shot  Completed   Hepatitis C Screening  Completed   HPV Vaccine  Aged Out    Advanced directives: (Declined) Advance directive discussed with you today. Even though you declined this today, please call our office should you change your mind, and we can give you the proper paperwork for you to fill out.  Next Medicare Annual Wellness Visit scheduled for next year: Yes 12/14/2024 @ 1:40pm televisit

## 2023-12-12 NOTE — Progress Notes (Signed)
 Subjective:   Edward [name] is a 68 y.o. male who presents for an Initial Medicare Annual Wellness Visit.  Visit Complete: In person  Patient Medicare AWV questionnaire was completed by the patient on 12/09/23; I have confirmed that all information answered by patient is correct and no changes since this date.  Cardiac Risk Factors include: advanced age (>57men, >65 women);diabetes mellitus;dyslipidemia;obesity (BMI >30kg/m2)    Objective:    Today's Vitals   12/12/23 1430  BP: 118/68  Weight: 215 lb (97.5 kg)  Height: 5' 9 (1.753 m)   Body mass index is 31.75 kg/m.     12/12/2023    2:43 PM 09/29/2022    9:43 AM 04/20/2021    1:07 PM 03/20/2021    5:54 AM  Advanced Directives  Does Patient Have a Medical Advance Directive? No No No No  Would patient like information on creating a medical advance directive?   No - Patient declined Yes (MAU/Ambulatory/Procedural Areas - Information given)    Current Medications (verified) Outpatient Encounter Medications as of 12/12/2023  Medication Sig   aspirin  81 MG EC tablet Take 1 tablet (81 mg total) by mouth daily.   atorvastatin  (LIPITOR ) 80 MG tablet Take 1 tablet by mouth once daily   dapagliflozin  propanediol (FARXIGA ) 10 MG TABS tablet Take 1 tablet (10 mg total) by mouth daily before breakfast.   digoxin  (LANOXIN ) 0.125 MG tablet Take 1 tablet (125 mcg total) by mouth daily.   eplerenone  (INSPRA ) 50 MG tablet Take 1 tablet by mouth once daily   furosemide  (LASIX ) 20 MG tablet Take 1 tablet (20 mg total) by mouth daily.   loratadine  (CLARITIN ) 10 MG tablet Take 10 mg by mouth daily.   metoprolol  succinate (TOPROL -XL) 50 MG 24 hr tablet Take 1 tablet by mouth once daily   Multiple Vitamin (MULTIVITAMIN WITH MINERALS) TABS tablet Take 1 tablet by mouth daily.   sacubitril -valsartan  (ENTRESTO ) 49-51 MG Take 1 tablet by mouth 2 (two) times daily.   Semaglutide ,0.25 or 0.5MG /DOS, (OZEMPIC , 0.25 OR 0.5 MG/DOSE,) 2 MG/3ML SOPN  Inject 0.5 mg once weekly   No facility-administered encounter medications on file as of 12/12/2023.   Allergies (verified) Spironolactone    History: Past Medical History:  Diagnosis Date   CAD (coronary artery disease)    a. STEMI 2004 with occluded Cx and 80% RCA s/p DESx2. b. CABG 03/2021.   Chronic systolic CHF (congestive heart failure) (HCC)    CKD (chronic kidney disease), stage III (HCC)    HCV antibody positive    previous eval at [facility] hepatology clinic   Ischemic cardiomyopathy    STEMI (ST elevation myocardial infarction) (HCC) 2004   with totally occluded CFX (infarct related artery) and 80% mRCA stenosis. Taxus DES to both vessles. EF was 50-55% by LV-gram   Thrombocytopenia [facility])    Past Surgical History:  Procedure Laterality Date   CORONARY ANGIOPLASTY WITH STENT PLACEMENT  08/22/03   CORONARY ARTERY BYPASS GRAFT N/A 03/22/2021   Procedure: CORONARY ARTERY BYPASS GRAFTING (CABG) X  THREE USING LEFT INTERNAL MAMMARY ARTERY AND RIGHT ENDOSCOPIC SAPHENOUS VEIN HARVEST CONDUITS;  Surgeon: Shyrl Linnie KIDD, MD;  Location: [facility] OR;  Service: Open Heart Surgery;  Laterality: N/A;   RIGHT/LEFT HEART CATH AND CORONARY ANGIOGRAPHY N/A 03/20/2021   Procedure: RIGHT/LEFT HEART CATH AND CORONARY ANGIOGRAPHY;  Surgeon: Burnard Debby LABOR, MD;  Location: [facility] INVASIVE CV LAB;  Service: Cardiovascular;  Laterality: N/A;   TEE WITHOUT CARDIOVERSION N/A 03/22/2021   Procedure: TRANSESOPHAGEAL ECHOCARDIOGRAM (  TEE);  Surgeon: Shyrl Linnie KIDD, MD;  Location: [facility] OR;  Service: Open Heart Surgery;  Laterality: N/A;   Family History  Problem Relation Age of Onset   Aneurysm Father        brain   Stroke Father    Other Mother        ASCVD   Hypertension Mother    Heart attack Other        uncle   Diabetes Child    Diabetes Child    Colon cancer Neg Hx    Prostate cancer Neg Hx    Social History   Socioeconomic History   Marital status: Legally Separated    Spouse name: Kaynen Minner    Number of children: 3   Years of education: 16   Highest education level: 12th grade  Occupational History   Occupation: Retired  Tobacco Use   Smoking status: Former    Current packs/day: 0.00    Average packs/day: 1 pack/day for 10.0 years (10.0 ttl pk-yrs)    Types: Cigarettes    Start date: 12/10/1993    Quit date: 12/11/2003    Years since quitting: 20.0   Smokeless tobacco: Never  Vaping Use   Vaping status: Never Used  Substance and Sexual Activity   Alcohol use: No   Drug use: No   Sexual activity: Not on file  Other Topics Concern   Not on file  Social History Narrative   Left handed    Married, 1980   Lives in [place]   3 children   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   Likes bass fishing   Caffeine: 2cups coffee or less per day   Social Drivers of Corporate Investment Banker Strain: Low Risk  (12/09/2023)   Overall Financial Resource Strain (CARDIA)    Difficulty of Paying Living Expenses: Not very hard  Food Insecurity: No Food Insecurity (12/09/2023)   Hunger Vital Sign    Worried About Running Out of Food in the Last Year: Never true    Ran Out of Food in the Last Year: Never true  Transportation Needs: No Transportation Needs (12/09/2023)   PRAPARE - Administrator, Civil Service (Medical): No    Lack of Transportation (Non-Medical): No  Physical Activity: Sufficiently Active (12/09/2023)   Exercise Vital Sign    Days of Exercise per Week: 4 days    Minutes of Exercise per Session: 40 min  Stress: No Stress Concern Present (12/09/2023)   [employer] of Occupational Health - Occupational Stress Questionnaire    Feeling of Stress : Only a little  Social Connections: Unknown (12/09/2023)   Social Connection and Isolation Panel [NHANES]    Frequency of Communication with Friends and Family: Once a week    Frequency of Social Gatherings with Friends and Family: Once a week    Attends Religious Services: Patient  declined    Database Administrator or Organizations: No    Attends Engineer, Structural: Not on file    Marital Status: Married    Tobacco Counseling Counseling given: Not Answered  Clinical Intake:  Pre-visit preparation completed: Yes  Pain : No/denies pain   BMI - recorded: 31.75 Nutritional Status: BMI > 30  Obese Nutritional Risks: None Diabetes: No  How often do you need to have someone help you when you read instructions, pamphlets, or other written materials from your doctor or pharmacy?: 1 - Never  Interpreter Needed?: No  Comments: lives with wife Information entered by :: B.Domonik Levario,LPN   Activities of Daily Living    12/09/2023    9:04 AM  In your present state of health, do you have any difficulty performing the following activities:  Hearing? 0  Vision? 0  Difficulty concentrating or making decisions? 0  Walking or climbing stairs? 0  Dressing or bathing? 0  Doing errands, shopping? 0  Preparing Food and eating ? N  Using the Toilet? N  In the past six months, have you accidently leaked urine? N  Do you have problems with loss of bowel control? N  Managing your Medications? N  Managing your Finances? N  Housekeeping or managing your Housekeeping? N    Patient Care Team: Cleatus Arlyss RAMAN, MD as PCP - General (Family Medicine) Jeffrie Oneil BROCKS, MD as PCP - Cardiology (Cardiology) Nieves Cough, MD as Consulting Physician (Urology)  Indicate any recent Medical Services you may have received from other than [facility] providers in the past year (date may be approximate).     Assessment:   This is a routine wellness examination for Colbe.  Hearing/Vision screen Hearing Screening - Comments:: Pt says his hearing is good Vision Screening - Comments:: Pt does ot wear glasses and vision is good   Goals Addressed             This Visit's Progress    Patient Stated       I would like to lose 15 pounds:going to gym, better eating habits        Depression Screen    12/12/2023    2:40 PM 06/21/2023   11:02 AM 06/21/2021    4:53 PM 04/26/2021    3:14 PM 02/03/2019    9:07 AM 11/15/2017   10:01 AM  PHQ 2/9 Scores  PHQ - 2 Score 0 3 2 2  0 0  PHQ- 9 Score  6 10 4       Fall Risk    12/09/2023    9:04 AM 06/21/2023   11:02 AM 04/20/2021    1:07 PM  Fall Risk   Falls in the past year? 0 0 0  Number falls in past yr: 0 0 0  Injury with Fall? 0 0 0  Risk for fall due to : No Fall Risks No Fall Risks No Fall Risks  Follow up Education provided;Falls prevention discussed Falls evaluation completed Falls evaluation completed;Education provided;Falls prevention discussed    MEDICARE RISK AT HOME: Medicare Risk at Home Any stairs in or around the home?: (Patient-Rptd) No Home free of loose throw rugs in walkways, pet beds, electrical cords, etc?: (Patient-Rptd) Yes Adequate lighting in your home to reduce risk of falls?: (Patient-Rptd) Yes Life alert?: (Patient-Rptd) No Use of a cane, walker or w/c?: (Patient-Rptd) No Grab bars in the bathroom?: (Patient-Rptd) No Shower chair or bench in shower?: (Patient-Rptd) No Elevated toilet seat or a handicapped toilet?: (Patient-Rptd) No  TIMED UP AND GO:  Was the test performed? Yes  Length of time to ambulate 10 feet: 8 sec Gait steady and fast without use of assistive device    Cognitive Function:        12/12/2023    2:45 PM  6CIT Screen  What Year? 0 points  What month? 0 points  What time? 0 points  Count back from 20 0 points  Months in reverse 2 points  Repeat phrase 8 points  Total Score 10 points    Immunizations Immunization History  Administered Date(s) Administered  Fluad Trivalent(High Dose 65+) 10/01/2023   Influenza Whole 09/27/2008   Influenza, Quadrivalent, Recombinant, Inj, Pf 10/21/2018   Influenza-Unspecified 09/25/2014, 09/21/2015, 09/09/2018   Moderna Sars-Covid-2 Vaccination 06/23/2020, 07/21/2020   Pneumococcal Polysaccharide-23 03/21/2021    Td 12/11/1996, 08/04/2010    TDAP status: Up to date  Flu Vaccine status: Up to date  Pneumococcal vaccine status: Declined,  Education has been provided regarding the importance of this vaccine but patient still declined. Advised may receive this vaccine at local pharmacy or Health Dept. Aware to provide a copy of the vaccination record if obtained from local pharmacy or Health Dept. Verbalized acceptance and understanding.   Covid-19 vaccine status: Completed vaccines  Qualifies for Shingles Vaccine? Yes   Zostavax completed No   Shingrix Completed?: No.    Education has been provided regarding the importance of this vaccine. Patient has been advised to call insurance company to determine out of pocket expense if they have not yet received this vaccine. Advised may also receive vaccine at local pharmacy or Health Dept. Verbalized acceptance and understanding.  Screening Tests Health Maintenance  Topic Date Due   OPHTHALMOLOGY EXAM  01/13/2024 (Originally 09/19/1966)   COVID-19 Vaccine (3 - 2024-25 season) 03/09/2024 (Originally 08/11/2023)   Zoster Vaccines- Shingrix (1 of 2) 03/11/2024 (Originally 09/19/2005)   DTaP/Tdap/Td (3 - Tdap) 12/11/2024 (Originally 08/04/2020)   Pneumonia Vaccine 33+ Years old (2 of 2 - PCV) 12/11/2024 (Originally 03/21/2022)   Colonoscopy  02/05/2025 (Originally 09/19/2000)   HEMOGLOBIN A1C  03/31/2024   Diabetic kidney evaluation - Urine ACR  09/30/2024   FOOT EXAM  09/30/2024   Diabetic kidney evaluation - eGFR measurement  10/29/2024   Medicare Annual Wellness (AWV)  12/11/2024   INFLUENZA VACCINE  Completed   Hepatitis C Screening  Completed   HPV VACCINES  Aged Out    Health Maintenance  There are no preventive care reminders to display for this patient.   Colorectal cancer screening: Referral to GI placed yes. Pt aware the office will call re: appt.  Lung Cancer Screening: (Low Dose CT Chest recommended if Age 46-80 years, 20 pack-year  currently smoking OR have quit w/in 15years.) does not qualify.   Lung Cancer Screening Referral: no  Additional Screening:  Hepatitis C Screening: does not qualify; Completed 04/30/2008  Vision Screening: Recommended annual ophthalmology exams for early detection of glaucoma and other disorders of the eye. Is the patient up to date with their annual eye exam?  Yes  Who is the provider or what is the name of the office in which the patient attends annual eye exams? Dr Portia If pt is not established with a provider, would they like to be referred to a provider to establish care? No .   Dental Screening: Recommended annual dental exams for proper oral hygiene  Diabetic Foot Exam: Diabetic Foot Exam: Completed 10/01/2023  Community Resource Referral / Chronic Care Management: CRR required this visit?  No   CCM required this visit?  Appt scheduled with PCP    Plan:     I have personally reviewed and noted the following in the patient's chart:   Medical and social history Use of alcohol, tobacco or illicit drugs  Current medications and supplements including opioid prescriptions. Patient is not currently taking opioid prescriptions. Functional ability and status Nutritional status Physical activity Advanced directives List of other physicians Hospitalizations, surgeries, and ER visits in previous 12 months Vitals Screenings to include cognitive, depression, and falls Referrals and appointments  In addition, I  have reviewed and discussed with patient certain preventive protocols, quality metrics, and best practice recommendations. A written personalized care plan for preventive services as well as general preventive health recommendations were provided to patient.   Erminio LITTIE Saris, LPN   07/16/7973   After Visit Summary: (MyChart) Due to this being a telephonic visit, the after visit summary with patients personalized plan was offered to patient via MyChart   Nurse  Notes: The patient states they are doing well and has no concerns or questions at this time. Pt declined Prevnar-20 today. Appt made w/PCP for CPE.

## 2023-12-13 ENCOUNTER — Other Ambulatory Visit: Payer: Self-pay

## 2023-12-13 ENCOUNTER — Telehealth: Payer: Self-pay

## 2023-12-13 DIAGNOSIS — Z1211 Encounter for screening for malignant neoplasm of colon: Secondary | ICD-10-CM

## 2023-12-13 MED ORDER — GOLYTELY 236 G PO SOLR: 4000.0000 mL | Freq: Once | ORAL | 0 refills | Status: AC

## 2023-12-13 NOTE — Telephone Encounter (Signed)
 Patient of [facility] clinic. Request for medical clearance to undergo colonoscopy on 01/07/24.  Please direct your response to p cv div preop.  Thank you, Marcelino Duster

## 2023-12-13 NOTE — Telephone Encounter (Signed)
   Pre-operative Risk Assessment    Patient Name: Edward [name]  DOB: [birth year] MRN: [id number]   Date of last office visit: 10/18/21 Dr. Waddell Date of next office visit: None   Request for Surgical Clearance    Procedure:   Colonoscopy  Date of Surgery:  Clearance 01/07/24                                Surgeon:  Dr. Therisa Socks Group or Practice Name:  [facility] Phone number:  [phone] Fax number:  [phone]   Type of Clearance Requested:   - Medical    Type of Anesthesia:  General    Additional requests/questions:    SignedJolan Amery Minasyan   12/13/2023, 3:39 PM

## 2023-12-13 NOTE — Telephone Encounter (Signed)
 Gastroenterology Pre-Procedure Review  Request Date: 01/07/24 Requesting Physician: Dr. Therisa  PATIENT REVIEW QUESTIONS: The patient responded to the following health history questions as indicated:    1. Are you having any GI issues? no 2. Do you have a personal history of Polyps? no 3. Do you have a family history of Colon Cancer or Polyps? no 4. Diabetes Mellitus? yes (takes Farxiga  and Semaglutide  has been advised and noted to stop Farxiga  3 days prior and Semaglutide  7 days prior) 5. Joint replacements in the past 12 months?no 6. Major health problems in the past 3 months?no 7. Any artificial heart valves, MVP, or defibrillator?no 8. Cardiac history? Open heart surgery     MEDICATIONS & ALLERGIES:    Patient reports the following regarding taking any anticoagulation/antiplatelet therapy:   Plavix, Coumadin, Eliquis, Xarelto, Lovenox , Pradaxa, Brilinta, or Effient? no Aspirin ? yes (81 mg daily)  Patient confirms/reports the following medications:  Current Outpatient Medications  Medication Sig Dispense Refill   polyethylene glycol (GOLYTELY ) 236 g solution Take 4,000 mLs by mouth once for 1 dose. 4000 mL 0   aspirin  81 MG EC tablet Take 1 tablet (81 mg total) by mouth daily.     atorvastatin  (LIPITOR ) 80 MG tablet Take 1 tablet by mouth once daily 90 tablet 3   dapagliflozin  propanediol (FARXIGA ) 10 MG TABS tablet Take 1 tablet (10 mg total) by mouth daily before breakfast. 30 tablet 11   digoxin  (LANOXIN ) 0.125 MG tablet Take 1 tablet (125 mcg total) by mouth daily. 90 tablet 3   eplerenone  (INSPRA ) 50 MG tablet Take 1 tablet by mouth once daily 90 tablet 0   furosemide  (LASIX ) 20 MG tablet Take 1 tablet (20 mg total) by mouth daily.     loratadine  (CLARITIN ) 10 MG tablet Take 10 mg by mouth daily.     metoprolol  succinate (TOPROL -XL) 50 MG 24 hr tablet Take 1 tablet by mouth once daily 90 tablet 0   Multiple Vitamin (MULTIVITAMIN WITH MINERALS) TABS tablet Take 1 tablet by mouth  daily.     sacubitril -valsartan  (ENTRESTO ) 49-51 MG Take 1 tablet by mouth 2 (two) times daily. 180 tablet 3   Semaglutide ,0.25 or 0.5MG /DOS, (OZEMPIC , 0.25 OR 0.5 MG/DOSE,) 2 MG/3ML SOPN Inject 0.5 mg once weekly 3 mL 4   No current facility-administered medications for this visit.    Patient confirms/reports the following allergies:  Allergies  Allergen Reactions   Spironolactone      Gynecomastia     No orders of the defined types were placed in this encounter.   AUTHORIZATION INFORMATION Primary Insurance: 1D#: Group #:  Secondary Insurance: 1D#: Group #:  SCHEDULE INFORMATION: Date: 01/07/24 Time: Location: [facility]

## 2023-12-16 ENCOUNTER — Other Ambulatory Visit (HOSPITAL_COMMUNITY): Payer: Self-pay | Admitting: Cardiology

## 2023-12-16 NOTE — Telephone Encounter (Signed)
   Primary Cardiologist: Oneil Parchment, MD  Chart reviewed as part of pre-operative protocol coverage. Given past medical history and time since last visit, based on ACC/AHA guidelines, Juanya Villavicencio would be at acceptable risk for the planned procedure without further cardiovascular testing.   Patient was advised that if he develops new symptoms prior to surgery to contact our office to arrange a follow-up appointment.   Per office protocol, he may hold aspirin  for 5-7 days prior to procedure and should resume as soon as hemodynamically stable postoperatively.  I will route this recommendation to the requesting party via Epic fax function and remove from pre-op pool.  Please call with questions.  Rosaline EMERSON Bane, NP-C  12/16/2023, 8:43 AM [address] Office [phone] Fax [phone]

## 2023-12-19 ENCOUNTER — Encounter: Payer: Medicare Other | Admitting: Family Medicine

## 2023-12-19 DIAGNOSIS — H524 Presbyopia: Secondary | ICD-10-CM | POA: Diagnosis not present

## 2023-12-19 DIAGNOSIS — H2513 Age-related nuclear cataract, bilateral: Secondary | ICD-10-CM | POA: Diagnosis not present

## 2023-12-19 LAB — HM DIABETES EYE EXAM

## 2023-12-19 NOTE — Telephone Encounter (Signed)
 Patient aware Reports he will come  by the office today to pick up

## 2023-12-20 ENCOUNTER — Encounter (INDEPENDENT_AMBULATORY_CARE_PROVIDER_SITE_OTHER): Payer: Medicare Other | Admitting: Cardiology

## 2023-12-20 ENCOUNTER — Ambulatory Visit: Payer: Medicare Other | Attending: Family Medicine

## 2023-12-20 DIAGNOSIS — G4710 Hypersomnia, unspecified: Secondary | ICD-10-CM

## 2023-12-20 DIAGNOSIS — R400 Somnolence: Secondary | ICD-10-CM

## 2023-12-20 DIAGNOSIS — G4719 Other hypersomnia: Secondary | ICD-10-CM

## 2023-12-20 NOTE — Telephone Encounter (Signed)
 ITAMAR home sleep study given to patient, all instructions explained, waiver signed, and CLOUDPAT registration complete.  Serial number [id number]

## 2023-12-20 NOTE — Telephone Encounter (Signed)
 Patient Name: Edward [name]        DOB: 06-12-1956      Height: 5 8    Tzphyu:[id number]  Office Name:[facility]         Referring Provider:Dalton Rolan Bank Date:12/20/2023    Serial number [id number]   STOP BANG RISK ASSESSMENT S (snore) Have you been told that you snore?     YES   T (tired) Are you often tired, fatigued, or sleepy during the day?   NO  O (obstruction) Do you stop breathing, choke, or gasp during sleep? NO   P (pressure) Do you have or are you being treated for high blood pressure? NO   B (BMI) Is your body index greater than 35 kg/m? NO   A (age) Are you 69 years old or older? YES   N (neck) Do you have a neck circumference greater than 16 inches?   NO   G (gender) Are you a male? YES   TOTAL STOP/BANG "YES" ANSWERS                                                                        For Office Use Only              Procedure Order Form    YES to 3+ Stop Bang questions OR two clinical symptoms - patient qualifies for WatchPAT (CPT 95800)             Clinical Notes: Will consult Sleep Specialist and refer for management of therapy due to patient increased risk of Sleep Apnea. Ordering a sleep study due to the following two clinical symptoms: Excessive daytime sleepiness G47.10 /Unrefreshed by sleep G47.8 History of high blood pressure R03.0 / Insomnia G47.00    I understand that I am proceeding with a home sleep apnea test as ordered by my treating physician. I understand that untreated sleep apnea is a serious cardiovascular risk factor and it is my responsibility to perform the test and seek management for sleep apnea. I will be contacted with the results and be managed for sleep apnea by a local sleep physician. I will be receiving equipment and further instructions from [facility]. I shall promptly ship back the equipment via the included mailing label. I understand my insurance will be billed for the test and as the patient I  am responsible for any insurance related out-of-pocket costs incurred. I have been provided with written instructions and can call for additional video or telephonic instruction, with 24-hour availability of qualified personnel to answer any questions: Patient Help Desk [phone].  Patient Signature ______________________________________________________   Date______________________ Patient Telemedicine Verbal Consent

## 2023-12-21 NOTE — Procedures (Signed)
    SLEEP STUDY REPORT Patient Information Study Date: 12/20/2023 Patient Name: Edward [name] Patient ID: [id number] Birth Date: 01/29/1956 Age: 68 Gender: Male BMI: 32.0 (W=216 lb, H=5' 9'') Referring Physician: Ezra Shuck, MD  TEST DESCRIPTION: Home sleep apnea testing was completed using the WatchPat, a Type 1 device, utilizing peripheral arterial tonometry (PAT), chest movement, actigraphy, pulse oximetry, pulse rate, body position and snore. AHI was calculated with apnea and hypopnea using valid sleep time as the denominator. RDI includes apneas, hypopneas, and RERAs. The data acquired and the scoring of sleep and all associated events were performed in accordance with the recommended standards and specifications as outlined in the AASM Manual for the Scoring of Sleep and Associated Events 2.2.0 (2015).  FINDINGS: 1. No evidence of Obstructive Sleep Apnea with AHI 4.7/hr. 2. No Central Sleep Apnea. 3. Oxygen desaturations as low as 83%. 4. Mild snoring was present. O2 sats were < 88% for 0.3 minutes. 5. Total sleep time was 8 hrs and 0 min. 6. 27.5% of total sleep time was spent in REM sleep. 7. Normal sleep onset latency at 16 min. 8. Normal REM sleep onset latency at 89 min. 9. Total awakenings were 3.  DIAGNOSIS: Normal study with no significant sleep disordered breathing.  RECOMMENDATIONS: 1. Normal study with no significant sleep disordered breathing.  2. Healthy sleep recommendations include: adequate nightly sleep (normal 7-9 hrs/night), avoidance of caffeine after noon and alcohol near bedtime, and maintaining a sleep environment that is cool, dark and quiet. 3. Weight loss for overweight patients is recommended.  4. Snoring recommendations include: weight loss where appropriate, side sleeping, and avoidance of alcohol before bed.  5. Operation of motor vehicle or dangerous equipment must be avoided when feeling drowsy, excessively sleepy, or mentally  fatigued.  6. An ENT consultation which may be useful for specific causes of and possible treatment of bothersome snoring .  7. Weight loss may be of benefit in reducing the severity of snoring.   Signature: Wilbert Bihari, MD; [facility]; Diplomat, American Board of Sleep Medicine Electronically Signed: 12/21/2023 10:58:04 AM

## 2023-12-23 ENCOUNTER — Telehealth: Payer: Self-pay

## 2023-12-23 NOTE — Telephone Encounter (Signed)
-----   Message from Armanda Magic sent at 12/21/2023 11:00 AM EST ----- Please let patient know that sleep study showed no significant sleep apnea.

## 2023-12-23 NOTE — Telephone Encounter (Signed)
 Notified patient of sleep study results and recommendations. All questions were answered and patient verbalized understanding.

## 2023-12-24 ENCOUNTER — Ambulatory Visit (INDEPENDENT_AMBULATORY_CARE_PROVIDER_SITE_OTHER): Payer: Medicare Other | Admitting: Family Medicine

## 2023-12-24 ENCOUNTER — Encounter: Payer: Self-pay | Admitting: Family Medicine

## 2023-12-24 VITALS — BP 110/64 | HR 69 | Temp 97.8°F | Ht 69.0 in | Wt 218.0 lb

## 2023-12-24 DIAGNOSIS — N529 Male erectile dysfunction, unspecified: Secondary | ICD-10-CM

## 2023-12-24 DIAGNOSIS — R7989 Other specified abnormal findings of blood chemistry: Secondary | ICD-10-CM | POA: Diagnosis not present

## 2023-12-24 DIAGNOSIS — Z125 Encounter for screening for malignant neoplasm of prostate: Secondary | ICD-10-CM

## 2023-12-24 LAB — CBC WITH DIFFERENTIAL/PLATELET
Basophils Absolute: 0 10*3/uL (ref 0.0–0.1)
Basophils Relative: 0.6 % (ref 0.0–3.0)
Eosinophils Absolute: 0.2 10*3/uL (ref 0.0–0.7)
Eosinophils Relative: 4.1 % (ref 0.0–5.0)
HCT: 49.6 % (ref 39.0–52.0)
Hemoglobin: 16.5 g/dL (ref 13.0–17.0)
Lymphocytes Relative: 32.7 % (ref 12.0–46.0)
Lymphs Abs: 1.5 10*3/uL (ref 0.7–4.0)
MCHC: 33.2 g/dL (ref 30.0–36.0)
MCV: 92.3 fL (ref 78.0–100.0)
Monocytes Absolute: 0.4 10*3/uL (ref 0.1–1.0)
Monocytes Relative: 9.3 % (ref 3.0–12.0)
Neutro Abs: 2.4 10*3/uL (ref 1.4–7.7)
Neutrophils Relative %: 53.3 % (ref 43.0–77.0)
Platelets: 127 10*3/uL — ABNORMAL LOW (ref 150.0–400.0)
RBC: 5.38 Mil/uL (ref 4.22–5.81)
RDW: 13.3 % (ref 11.5–15.5)
WBC: 4.5 10*3/uL (ref 4.0–10.5)

## 2023-12-24 LAB — TESTOSTERONE: Testosterone: 314.23 ng/dL (ref 300.00–890.00)

## 2023-12-24 LAB — PSA, MEDICARE: PSA: 5.77 ng/mL — ABNORMAL HIGH (ref 0.10–4.00)

## 2023-12-24 NOTE — Patient Instructions (Signed)
 Go to the lab on the way out.   If you have mychart we'll likely use that to update you.    Take care.  Glad to see you. Let me see about options when I see your labs.

## 2023-12-24 NOTE — Progress Notes (Signed)
 Normal study with no significant sleep disordered breathing d/w pt.   D/w pt about checking PSA and testosterone  level.    If T low and PSA wnl, then reasonable to consider T replacement for ED.    D/w pt about T replacement options and monitoring.    H/o ED noted.   Meds, vitals, and allergies reviewed.   ROS: Per HPI unless specifically indicated in ROS section   Nad Ncat Neck supple, no LA Rrr Ctab Abd soft.

## 2023-12-25 ENCOUNTER — Other Ambulatory Visit: Payer: Self-pay | Admitting: Family Medicine

## 2023-12-25 DIAGNOSIS — R7989 Other specified abnormal findings of blood chemistry: Secondary | ICD-10-CM

## 2023-12-25 DIAGNOSIS — R972 Elevated prostate specific antigen [PSA]: Secondary | ICD-10-CM

## 2023-12-25 DIAGNOSIS — N529 Male erectile dysfunction, unspecified: Secondary | ICD-10-CM

## 2023-12-25 NOTE — Assessment & Plan Note (Addendum)
 With h/o low T level prev.  Recheck T level today for 2nd check.  Check PSA.    If T low and PSA wnl, then reasonable to consider T replacement for ED.   D/w pt about T replacement options and monitoring.    See notes on labs.

## 2024-01-06 ENCOUNTER — Encounter: Payer: Self-pay | Admitting: Gastroenterology

## 2024-01-07 ENCOUNTER — Ambulatory Visit: Payer: Medicare Other | Admitting: Anesthesiology

## 2024-01-07 ENCOUNTER — Encounter: Payer: Self-pay | Admitting: Gastroenterology

## 2024-01-07 ENCOUNTER — Encounter
Admission: RE | Disposition: A | Discharge status: Home / Self Care | Payer: Self-pay | Source: Home / Self Care | Attending: Gastroenterology

## 2024-01-07 ENCOUNTER — Ambulatory Visit
Admission: RE | Admit: 2024-01-07 | Discharge: 2024-01-07 | Disposition: A | Discharge status: Home / Self Care | Payer: Medicare Other | Attending: Gastroenterology | Admitting: Gastroenterology

## 2024-01-07 DIAGNOSIS — Z951 Presence of aortocoronary bypass graft: Secondary | ICD-10-CM | POA: Insufficient documentation

## 2024-01-07 DIAGNOSIS — K649 Unspecified hemorrhoids: Secondary | ICD-10-CM | POA: Diagnosis not present

## 2024-01-07 DIAGNOSIS — I5022 Chronic systolic (congestive) heart failure: Secondary | ICD-10-CM | POA: Diagnosis not present

## 2024-01-07 DIAGNOSIS — E1122 Type 2 diabetes mellitus with diabetic chronic kidney disease: Secondary | ICD-10-CM | POA: Diagnosis not present

## 2024-01-07 DIAGNOSIS — Z7984 Long term (current) use of oral hypoglycemic drugs: Secondary | ICD-10-CM | POA: Insufficient documentation

## 2024-01-07 DIAGNOSIS — I252 Old myocardial infarction: Secondary | ICD-10-CM | POA: Insufficient documentation

## 2024-01-07 DIAGNOSIS — Z87891 Personal history of nicotine dependence: Secondary | ICD-10-CM | POA: Insufficient documentation

## 2024-01-07 DIAGNOSIS — I2510 Atherosclerotic heart disease of native coronary artery without angina pectoris: Secondary | ICD-10-CM | POA: Insufficient documentation

## 2024-01-07 DIAGNOSIS — K640 First degree hemorrhoids: Secondary | ICD-10-CM | POA: Diagnosis not present

## 2024-01-07 DIAGNOSIS — Z7985 Long-term (current) use of injectable non-insulin antidiabetic drugs: Secondary | ICD-10-CM | POA: Insufficient documentation

## 2024-01-07 DIAGNOSIS — Z1211 Encounter for screening for malignant neoplasm of colon: Secondary | ICD-10-CM | POA: Insufficient documentation

## 2024-01-07 DIAGNOSIS — N183 Chronic kidney disease, stage 3 unspecified: Secondary | ICD-10-CM | POA: Diagnosis not present

## 2024-01-07 HISTORY — PX: COLONOSCOPY WITH PROPOFOL: SHX5780

## 2024-01-07 HISTORY — DX: Prediabetes: R73.03

## 2024-01-07 LAB — GLUCOSE, CAPILLARY: Glucose-Capillary: 104 mg/dL — ABNORMAL HIGH (ref 70–99)

## 2024-01-07 SURGERY — COLONOSCOPY WITH PROPOFOL
Anesthesia: General

## 2024-01-07 MED ORDER — ONDANSETRON HCL 4 MG/2ML IJ SOLN
INTRAMUSCULAR | Status: AC
Start: 2024-01-07 — End: ?
  Filled 2024-01-07: qty 2

## 2024-01-07 MED ORDER — LIDOCAINE HCL (PF) 2 % IJ SOLN
INTRAMUSCULAR | Status: AC
  Filled 2024-01-07: qty 5

## 2024-01-07 MED ORDER — PROPOFOL 10 MG/ML IV BOLUS
INTRAVENOUS | Status: DC | PRN
  Administered 2024-01-07 (×2): 50 mg via INTRAVENOUS

## 2024-01-07 MED ORDER — SODIUM CHLORIDE 0.9 % IV SOLN
INTRAVENOUS | Status: DC
Start: 2024-01-07 — End: 2024-01-07

## 2024-01-07 MED ORDER — LIDOCAINE HCL (CARDIAC) PF 100 MG/5ML IV SOSY
PREFILLED_SYRINGE | INTRAVENOUS | Status: DC | PRN
  Administered 2024-01-07: 80 mg via INTRAVENOUS

## 2024-01-07 MED ORDER — PROPOFOL 500 MG/50ML IV EMUL
INTRAVENOUS | Status: DC | PRN
  Administered 2024-01-07: 75 ug/kg/min via INTRAVENOUS

## 2024-01-07 MED ORDER — DEXMEDETOMIDINE HCL IN NACL 80 MCG/20ML IV SOLN
INTRAVENOUS | Status: DC | PRN
  Administered 2024-01-07: 20 ug via INTRAVENOUS

## 2024-01-07 NOTE — H&P (Signed)
Wyline Mood, MD [address], [address], [place], [place], [address] [address], [address], [place], [place], [address] Phone: [phone]  Fax: [phone]  Primary Care Physician:  Joaquim Nam, MD   Pre-Procedure History & Physical: HPI:  Edward [name] is a 68 y.o. male is here for an colonoscopy.   Past Medical History:  Diagnosis Date   CAD (coronary artery disease)    a. STEMI 2004 with occluded Cx and 80% RCA s/p DESx2. b. CABG 03/2021.   Chronic systolic CHF (congestive heart failure) (HCC)    CKD (chronic kidney disease), stage III (HCC)    HCV antibody positive    previous eval at [facility] hepatology clinic   Ischemic cardiomyopathy    STEMI (ST elevation myocardial infarction) (HCC) 2004   with totally occluded CFX (infarct related artery) and 80% mRCA stenosis. Taxus DES to both vessles. EF was 50-55% by LV-gram   Thrombocytopenia [facility])     Past Surgical History:  Procedure Laterality Date   CORONARY ANGIOPLASTY WITH STENT PLACEMENT  08/22/03   CORONARY ARTERY BYPASS GRAFT N/A 03/22/2021   Procedure: CORONARY ARTERY BYPASS GRAFTING (CABG) X  THREE USING LEFT INTERNAL MAMMARY ARTERY AND RIGHT ENDOSCOPIC SAPHENOUS VEIN HARVEST CONDUITS;  Surgeon: [name], MD;  Location: [facility] OR;  Service: Open Heart Surgery;  Laterality: N/A;   RIGHT/LEFT HEART CATH AND CORONARY ANGIOGRAPHY N/A 03/20/2021   Procedure: RIGHT/LEFT HEART CATH AND CORONARY ANGIOGRAPHY;  Surgeon: Lennette Bihari, MD;  Location: [facility] INVASIVE CV LAB;  Service: Cardiovascular;  Laterality: N/A;   TEE WITHOUT CARDIOVERSION N/A 03/22/2021   Procedure: TRANSESOPHAGEAL ECHOCARDIOGRAM (TEE);  Surgeon: [name], MD;  Location: [facility] OR;  Service: Open Heart Surgery;  Laterality: N/A;    Prior to Admission medications   Medication Sig Start Date End Date Taking? Authorizing Provider  aspirin 81 MG EC tablet Take 1 tablet (81 mg total) by mouth daily. 09/22/21  Yes Jake Bathe, MD   atorvastatin (LIPITOR) 80 MG tablet Take 1 tablet by mouth once daily 07/23/23  Yes Laurey Morale, MD  dapagliflozin propanediol (FARXIGA) 10 MG TABS tablet Take 1 tablet (10 mg total) by mouth daily before breakfast. 09/30/23  Yes Bensimhon, Bevelyn Buckles, MD  digoxin (LANOXIN) 0.125 MG tablet Take 1 tablet (125 mcg total) by mouth daily. 09/30/23  Yes Bensimhon, Bevelyn Buckles, MD  eplerenone (INSPRA) 50 MG tablet Take 1 tablet by mouth once daily 11/20/23  Yes Laurey Morale, MD  furosemide (LASIX) 20 MG tablet Take 1 tablet (20 mg total) by mouth daily. 11/22/23  Yes Joaquim Nam, MD  loratadine (CLARITIN) 10 MG tablet Take 10 mg by mouth daily.   Yes [provider]  metoprolol succinate (TOPROL-XL) 50 MG 24 hr tablet Take 1 tablet by mouth once daily 12/17/23  Yes [place], [place], FNP  Multiple Vitamin (MULTIVITAMIN WITH MINERALS) TABS tablet Take 1 tablet by mouth daily.   Yes [provider]  sacubitril-valsartan (ENTRESTO) 49-51 MG Take 1 tablet by mouth 2 (two) times daily. 04/29/23  Yes Laurey Morale, MD  PEG 3350-KCl-NaBcb-NaCl-NaSulf (PEG-3350/ELECTROLYTES) 236 g SOLR Take 4,000 mLs by mouth once. 12/13/23   [provider]  Semaglutide,0.25 or 0.5MG /DOS, (OZEMPIC, 0.25 OR 0.5 MG/DOSE,) 2 MG/3ML SOPN Inject 0.5 mg once weekly 10/01/23   Joaquim Nam, MD    Allergies as of 12/13/2023 - Review Complete 12/12/2023  Allergen Reaction Noted   Spironolactone  10/10/2022  Family History  Problem Relation Age of Onset   Aneurysm Father        brain   Stroke Father    Other Mother        ASCVD   Hypertension Mother    Heart attack Other        uncle   Diabetes Child    Diabetes Child    Colon cancer Neg Hx    Prostate cancer Neg Hx     Social History   Socioeconomic History   Marital status: Legally Separated    Spouse name: Mukund Weinreb   Number of children: 3   Years of education: 16   Highest education level: 12th grade  Occupational  History   Occupation: Retired  Tobacco Use   Smoking status: Former    Current packs/day: 0.00    Average packs/day: 1 pack/day for 10.0 years (10.0 ttl pk-yrs)    Types: Cigarettes    Start date: 12/10/1993    Quit date: 12/11/2003    Years since quitting: 20.0   Smokeless tobacco: Never  Vaping Use   Vaping status: Never Used  Substance and Sexual Activity   Alcohol use: No   Drug use: No   Sexual activity: Not on file  Other Topics Concern   Not on file  Social History Narrative   Left handed    Married, 1980   Lives in [place]   3 children   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   Likes bass fishing, Patriots fan   Caffeine: 2cups coffee or less per day   Social Drivers of Corporate investment banker Strain: Low Risk  (12/09/2023)   Overall Financial Resource Strain (CARDIA)    Difficulty of Paying Living Expenses: Not very hard  Food Insecurity: No Food Insecurity (12/09/2023)   Hunger Vital Sign    Worried About Running Out of Food in the Last Year: Never true    Ran Out of Food in the Last Year: Never true  Transportation Needs: No Transportation Needs (12/09/2023)   PRAPARE - Administrator, Civil Service (Medical): No    Lack of Transportation (Non-Medical): No  Physical Activity: Sufficiently Active (12/09/2023)   Exercise Vital Sign    Days of Exercise per Week: 4 days    Minutes of Exercise per Session: 40 min  Stress: No Stress Concern Present (12/09/2023)   [employer] of Occupational Health - Occupational Stress Questionnaire    Feeling of Stress : Only a little  Social Connections: Unknown (12/09/2023)   Social Connection and Isolation Panel [NHANES]    Frequency of Communication with Friends and Family: Once a week    Frequency of Social Gatherings with Friends and Family: Once a week    Attends Religious Services: Patient declined    Database administrator or Organizations: No    Attends Hospital doctor: Not on file    Marital Status: Married  Catering manager Violence: Not At Risk (12/12/2023)   Humiliation, Afraid, Rape, and Kick questionnaire    Fear of Current or Ex-Partner: No    Emotionally Abused: No    Physically Abused: No    Sexually Abused: No    Review of Systems: See HPI, otherwise negative ROS  Physical Exam: BP 134/87   Pulse 89   Temp (!) 96.5 F (35.8 C) (Temporal)   Resp 16   Wt 98.9 kg   SpO2 97%   BMI 32.19 kg/m  General:   Alert,  pleasant and cooperative in NAD Head:  Normocephalic and atraumatic. Neck:  Supple; no masses or thyromegaly. Lungs:  Clear throughout to auscultation, normal respiratory effort.    Heart:  +S1, +S2, Regular rate and rhythm, No edema. Abdomen:  Soft, nontender and nondistended. Normal bowel sounds, without guarding, and without rebound.   Neurologic:  Alert and  oriented x4;  grossly normal neurologically.  Impression/Plan: Edward [name] is here for an colonoscopy to be performed for Screening colonoscopy average risk   Risks, benefits, limitations, and alternatives regarding  colonoscopy have been reviewed with the patient.  Questions have been answered.  All parties agreeable.   Wyline Mood, MD  01/07/2024, 10:19 AM

## 2024-01-07 NOTE — Anesthesia Postprocedure Evaluation (Signed)
Anesthesia Post Note  Patient: Edward [name]  Procedure(s) Performed: COLONOSCOPY WITH PROPOFOL  Patient location during evaluation: Endoscopy Anesthesia Type: General Level of consciousness: awake and alert Pain management: pain level controlled Vital Signs Assessment: post-procedure vital signs reviewed and stable Respiratory status: spontaneous breathing, nonlabored ventilation, respiratory function stable and patient connected to nasal cannula oxygen Cardiovascular status: blood pressure returned to baseline and stable Postop Assessment: no apparent nausea or vomiting Anesthetic complications: no   No notable events documented.   Last Vitals:  Vitals:   01/07/24 1137 01/07/24 1147  BP: (!) 94/53 121/64  Pulse: 77 75  Resp: (!) 22 20  Temp:    SpO2: 95% 100%    Last Pain:  Vitals:   01/07/24 1147  TempSrc:   PainSc: 0-No pain                 Corinda Gubler

## 2024-01-07 NOTE — Op Note (Signed)
[facility] Gastroenterology Patient Name: Edward [name] Procedure Date: 01/07/2024 10:59 AM MRN: [id number] Account #: [id number] Date of Birth: November 15, 1956 Admit Type: Outpatient Age: 68 Room: [facility] ENDO ROOM 2 Gender: Male Note Status: Finalized Instrument Name: Prentice Docker [id number] Procedure:             Colonoscopy Indications:           Screening for colorectal malignant neoplasm Providers:             Wyline Mood MD, MD Referring MD:          Wyline Mood MD, MD (Referring MD), Dwana Curd. Para March, MD                         (Referring MD) Medicines:             Monitored Anesthesia Care Complications:         No immediate complications. Procedure:             Pre-Anesthesia Assessment:                        - Prior to the procedure, a History and Physical was                         performed, and patient medications, allergies and                         sensitivities were reviewed. The patient's tolerance                         of previous anesthesia was reviewed.                        - The risks and benefits of the procedure and the                         sedation options and risks were discussed with the                         patient. All questions were answered and informed                         consent was obtained.                        - ASA Grade Assessment: II - A patient with mild                         systemic disease.                        After obtaining informed consent, the colonoscope was                         passed under direct vision. Throughout the procedure,                         the patient's blood pressure, pulse, and oxygen                         saturations  were monitored continuously. The                         Colonoscope was introduced through the anus and                         advanced to the the cecum, identified by the                         appendiceal orifice. The colonoscopy was performed                          with ease. The patient tolerated the procedure well.                         The quality of the bowel preparation was excellent.                         The ileocecal valve, appendiceal orifice, and rectum                         were photographed. Findings:      The perianal and digital rectal examinations were normal.      Non-bleeding internal hemorrhoids were found during retroflexion. The       hemorrhoids were medium-sized and Grade I (internal hemorrhoids that do       not prolapse).      The exam was otherwise without abnormality on direct and retroflexion       views. Impression:            - Non-bleeding internal hemorrhoids.                        - The examination was otherwise normal on direct and                         retroflexion views.                        - No specimens collected. Recommendation:        - Discharge patient to home (with escort).                        - Resume previous diet.                        - Continue present medications.                        - Repeat colonoscopy in 10 years for screening                         purposes. Procedure Code(s):     --- Professional ---                        [phone], Colonoscopy, flexible; diagnostic, including                         collection of specimen(s) by brushing or washing, when  performed (separate procedure) Diagnosis Code(s):     --- Professional ---                        Z12.11, Encounter for screening for malignant neoplasm                         of colon                        K64.0, First degree hemorrhoids CPT copyright 2022 American Medical Association. All rights reserved. The codes documented in this report are preliminary and upon coder review may  be revised to meet current compliance requirements. Wyline Mood, MD Wyline Mood MD, MD 01/07/2024 11:29:59 AM This report has been signed electronically. Number of Addenda: 0 Note Initiated On: 01/07/2024 10:59  AM Scope Withdrawal Time: 0 hours 10 minutes 29 seconds  Total Procedure Duration: 0 hours 12 minutes 51 seconds  Estimated Blood Loss:  Estimated blood loss: none.      [facility]

## 2024-01-07 NOTE — Transfer of Care (Signed)
Immediate Anesthesia Transfer of Care Note  Patient: Edward [name]  Procedure(s) Performed: COLONOSCOPY WITH PROPOFOL  Patient Location: PACU  Anesthesia Type:General  Level of Consciousness: sedated  Airway & Oxygen Therapy: Patient Spontanous Breathing  Post-op Assessment: Report given to RN and Post -op Vital signs reviewed and stable  Post vital signs: Reviewed and stable  Last Vitals:  Vitals Value Taken Time  BP 95/54 01/07/24 1127  Temp 36.1 C 01/07/24 1127  Pulse 86 01/07/24 1127  Resp 23 01/07/24 1127  SpO2 95 % 01/07/24 1127    Last Pain:  Vitals:   01/07/24 1127  TempSrc:   PainSc: Asleep         Complications: No notable events documented.

## 2024-01-07 NOTE — Anesthesia Preprocedure Evaluation (Signed)
Anesthesia Evaluation  Patient identified by MRN, date of birth, ID band Patient awake    Reviewed: Allergy & Precautions, NPO status , Patient's Chart, lab work & pertinent test results  History of Anesthesia Complications Negative for: history of anesthetic complications  Airway Mallampati: II  TM Distance: >3 FB Neck ROM: Full    Dental no notable dental hx. (+) Teeth Intact   Pulmonary neg pulmonary ROS, neg sleep apnea, neg COPD, Patient abstained from smoking.Not current smoker, former smoker   Pulmonary exam normal breath sounds clear to auscultation       Cardiovascular Exercise Tolerance: Good METS(-) hypertension+ CAD, + Past MI, + CABG and +CHF  (-) dysrhythmias  Rhythm:Regular Rate:Normal - Systolic murmurs    Neuro/Psych negative neurological ROS  negative psych ROS   GI/Hepatic ,neg GERD  ,,(+)     (-) substance abuse    Endo/Other  diabetes  GLP1 agonist held for two weeks. Denies GI symptoms today  Renal/GU negative Renal ROS     Musculoskeletal   Abdominal   Peds  Hematology   Anesthesia Other Findings Past Medical History: No date: CAD (coronary artery disease)     Comment:  a. STEMI 2004 with occluded Cx and 80% RCA s/p DESx2. b.              CABG 03/2021. No date: Chronic systolic CHF (congestive heart failure) (HCC) No date: CKD (chronic kidney disease), stage III (HCC) No date: HCV antibody positive     Comment:  previous eval at [facility] hepatology clinic No date: Ischemic cardiomyopathy No date: Pre-diabetes 2004: STEMI (ST elevation myocardial infarction) (HCC)     Comment:  with totally occluded CFX (infarct related artery) and               80% mRCA stenosis. Taxus DES to both vessles. EF was               50-55% by LV-gram No date: Thrombocytopenia (HCC)  Reproductive/Obstetrics                             Anesthesia Physical Anesthesia Plan  ASA:  3  Anesthesia Plan: General   Post-op Pain Management: Minimal or no pain anticipated   Induction: Intravenous  PONV Risk Score and Plan: 2 and Propofol infusion, TIVA and Ondansetron  Airway Management Planned: Nasal Cannula  Additional Equipment: None  Intra-op Plan:   Post-operative Plan:   Informed Consent: I have reviewed the patients History and Physical, chart, labs and discussed the procedure including the risks, benefits and alternatives for the proposed anesthesia with the patient or authorized representative who has indicated his/her understanding and acceptance.     Dental advisory given  Plan Discussed with: CRNA and Surgeon  Anesthesia Plan Comments: (Discussed risks of anesthesia with patient, including possibility of difficulty with spontaneous ventilation under anesthesia necessitating airway intervention, PONV, and rare risks such as cardiac or respiratory or neurological events, and allergic reactions. Discussed the role of CRNA in patient's perioperative care. Patient understands.)       Anesthesia Quick Evaluation

## 2024-01-08 ENCOUNTER — Encounter: Payer: Self-pay | Admitting: Gastroenterology

## 2024-01-27 ENCOUNTER — Other Ambulatory Visit: Payer: Self-pay | Admitting: Cardiology

## 2024-02-06 ENCOUNTER — Ambulatory Visit: Payer: Medicare Other | Admitting: Urology

## 2024-02-06 VITALS — BP 137/58 | HR 73 | Ht 70.0 in | Wt 216.0 lb

## 2024-02-06 DIAGNOSIS — R972 Elevated prostate specific antigen [PSA]: Secondary | ICD-10-CM

## 2024-02-06 DIAGNOSIS — N528 Other male erectile dysfunction: Secondary | ICD-10-CM

## 2024-02-06 DIAGNOSIS — E291 Testicular hypofunction: Secondary | ICD-10-CM | POA: Diagnosis not present

## 2024-02-06 MED ORDER — CLOMIPHENE CITRATE 50 MG PO TABS: 25.0000 mg | ORAL_TABLET | Freq: Every day | ORAL | 11 refills | Status: DC

## 2024-02-06 NOTE — Progress Notes (Signed)
 Marcelle Overlie Plume,acting as a scribe for Vanna Scotland, MD.,have documented all relevant documentation on the behalf of Vanna Scotland, MD,as directed by  Vanna Scotland, MD while in the presence of Vanna Scotland, MD.  02/06/24 10:42 AM   Edward [name] Si 06/02/1956 [id number]  Referring provider: Joaquim Nam, MD [address] [place],  [place] [address]  Chief Complaint  Patient presents with   Erectile Dysfunction   Hypogonadism    HPI: 68 year old male referred for evaluation of elevated PSA and possible hypogonadism.   His most recent testosterone level was within the normal range at 314, which is considered the cutoff for normal. However, a previous test showed a level of 265. His PSA is mildly elevated but stable from his baseline 6 years ago. He has never had a urologic evaluation for this. His CBC tends to run high. He expresses interest in improving his condition and is considering trying Clomid as an alternative to testosterone therapy due to concerns about PSA levels and hemoglobin.  He has a history of mild to moderate erectile dysfunction for the past four months, with difficulty maintaining and achieving an erection. He has not used medications like Viagra before and has no urinary symptoms. He is also interested in trying Viagra to address erectile dysfunction.  He has a history of cardiac disease and is on digoxin. He reports feeling low energy and has reduced his physical activity from four days a week to minimal activity. He denies current stress but had experienced stress six months ago.   He underwent a sleep study, which was normal, but reports poor sleep quality.      SHIM     Row Name 02/06/24 0915         SHIM: Over the last 6 months:   How do you rate your confidence that you could get and keep an erection? Very Low     When you had erections with sexual stimulation, how often were your erections hard enough for penetration (entering your  partner)? Most Times (much more than half the time)     During sexual intercourse, how often were you able to maintain your erection after you had penetrated (entered) your partner? A Few Times (much less than half the time)     During sexual intercourse, how difficult was it to maintain your erection to completion of intercourse? Difficult     When you attempted sexual intercourse, how often was it satisfactory for you? Sometimes (about half the time)       SHIM Total Score   SHIM 13              Androgen Deficiency in the Aging Male     Row Name 02/06/24 0900         Androgen Deficiency in the Aging Male   Do you have a decrease in libido (sex drive) Yes     Do you have lack of energy Yes     Do you have a decrease in strength and/or endurance Yes     Have you lost height No     Have you noticed a decreased "enjoyment of life" Yes     Are you sad and/or grumpy Yes     Are your erections less strong Yes     Have you noticed a recent deterioration in your ability to play sports Yes     Are you falling asleep after dinner Yes     Has there been  a recent deterioration in your work performance Yes               PMH: Past Medical History:  Diagnosis Date   CAD (coronary artery disease)    a. STEMI 2004 with occluded Cx and 80% RCA s/p DESx2. b. CABG 03/2021.   Chronic systolic CHF (congestive heart failure) (HCC)    CKD (chronic kidney disease), stage III (HCC)    HCV antibody positive    previous eval at [facility] hepatology clinic   Ischemic cardiomyopathy    Pre-diabetes    STEMI (ST elevation myocardial infarction) (HCC) 2004   with totally occluded CFX (infarct related artery) and 80% mRCA stenosis. Taxus DES to both vessles. EF was 50-55% by LV-gram   Thrombocytopenia [facility])     Surgical History: Past Surgical History:  Procedure Laterality Date   COLONOSCOPY WITH PROPOFOL N/A 01/07/2024   Procedure: COLONOSCOPY WITH PROPOFOL;  Surgeon: Wyline Mood, MD;  Location:  [facility] ENDOSCOPY;  Service: Gastroenterology;  Laterality: N/A;   CORONARY ANGIOPLASTY WITH STENT PLACEMENT  08/22/03   CORONARY ARTERY BYPASS GRAFT N/A 03/22/2021   Procedure: CORONARY ARTERY BYPASS GRAFTING (CABG) X  THREE USING LEFT INTERNAL MAMMARY ARTERY AND RIGHT ENDOSCOPIC SAPHENOUS VEIN HARVEST CONDUITS;  Surgeon: [name], MD;  Location: [facility] OR;  Service: Open Heart Surgery;  Laterality: N/A;   RIGHT/LEFT HEART CATH AND CORONARY ANGIOGRAPHY N/A 03/20/2021   Procedure: RIGHT/LEFT HEART CATH AND CORONARY ANGIOGRAPHY;  Surgeon: Lennette Bihari, MD;  Location: [facility] INVASIVE CV LAB;  Service: Cardiovascular;  Laterality: N/A;   TEE WITHOUT CARDIOVERSION N/A 03/22/2021   Procedure: TRANSESOPHAGEAL ECHOCARDIOGRAM (TEE);  Surgeon: [name], MD;  Location: [facility] OR;  Service: Open Heart Surgery;  Laterality: N/A;    Home Medications:  Allergies as of 02/06/2024       Reactions   Spironolactone    Gynecomastia         Medication List        Accurate as of February 06, 2024 10:42 AM. If you have any questions, ask your nurse or doctor.          STOP taking these medications    PEG-3350/Electrolytes 236 g Solr       TAKE these medications    aspirin EC 81 MG tablet Take 1 tablet (81 mg total) by mouth daily.   atorvastatin 80 MG tablet Commonly known as: LIPITOR Take 1 tablet by mouth once daily   clomiPHENE 50 MG tablet Commonly known as: CLOMID Take 0.5 tablets (25 mg total) by mouth daily.   dapagliflozin propanediol 10 MG Tabs tablet Commonly known as: Farxiga Take 1 tablet (10 mg total) by mouth daily before breakfast.   digoxin 0.125 MG tablet Commonly known as: LANOXIN Take 1 tablet (125 mcg total) by mouth daily.   Entresto 49-51 MG Generic drug: sacubitril-valsartan Take 1 tablet by mouth 2 (two) times daily.   eplerenone 50 MG tablet Commonly known as: INSPRA Take 1 tablet by mouth once daily   furosemide 20 MG tablet Commonly known  as: LASIX TAKE ONE TABLET BY MOUTH ONCE DAILY FOR THREE DAYS. THEN TAKE AS NEEDED FOR SHORTNESS OF BREATH   loratadine 10 MG tablet Commonly known as: CLARITIN Take 10 mg by mouth daily.   metoprolol succinate 50 MG 24 hr tablet Commonly known as: TOPROL-XL Take 1 tablet by mouth once daily   multivitamin with minerals Tabs tablet Take 1 tablet by mouth daily.   Ozempic (0.25 or 0.5  MG/DOSE) 2 MG/3ML Sopn Generic drug: Semaglutide(0.25 or 0.5MG /DOS) Inject 0.5 mg once weekly        Allergies:  Allergies  Allergen Reactions   Spironolactone     Gynecomastia     Family History: Family History  Problem Relation Age of Onset   Aneurysm Father        brain   Stroke Father    Other Mother        ASCVD   Hypertension Mother    Heart attack Other        uncle   Diabetes Child    Diabetes Child    Colon cancer Neg Hx    Prostate cancer Neg Hx     Social History:  reports that he quit smoking about 20 years ago. His smoking use included cigarettes. He started smoking about 30 years ago. He has a 10 pack-year smoking history. He has never used smokeless tobacco. He reports that he does not drink alcohol and does not use drugs.   Physical Exam: BP (!) 137/58   Pulse 73   Ht 5\' 10"  (1.778 m)   Wt 216 lb (98 kg)   BMI 30.99 kg/m   Constitutional:  Alert and oriented, No acute distress.  Muscular build. HEENT: [place] AT, moist mucus membranes.  Trachea midline, no masses. GU: Slightly enlarged prostate without nodularity,  Neurologic: Grossly intact, no focal deficits, moving all 4 extremities. Psychiatric: Normal mood and affect.   Assessment & Plan:    1. Elevated PSA - PSA is mildly elevated but stable from baseline. A DRE was performed, revealing a slightly enlarged prostate without nodularity, reducing concern for prostate cancer.  - Monitor PSA levels and perform a follow-up in three months with a repeat PSA test to ensure stability.  2. Erectile Dysfunction -  We discussed the pathophysiology of erectile dysfunction today along with possible contributing factors. Discussed possible treatment options including PDE 5 inhibitors, vacuum erectile device, intracavernosal injection, MUSE, and placement of the inflatable or malleable penile prosthesis for refractory cases.  In terms of PDE 5 inhibitors, we discussed contraindications for this medication as well as common side effects. Patient was counseled on optimal use. All of his questions were answered in detail.  - He reports mild to moderate erectile dysfunction for the past four months - A trial of sildenafil was suggested but he declined it at this time  3. Hypogonadism - His testosterone levels are on the low side of normal, but he does not qualify for testosterone replacement therapy under insurance guidelines. -  - Clomid is offered as an alternative to stimulate endogenous testosterone production. He is advised to take 25 mg daily and return in three months for follow-up labs, including testosterone levels, liver function tests (LFTs), CBC, and PSA. -Will need to monitor H&H and PSA closely given that he is borderline elevated at baseline   Return in about 3 months (around 05/05/2024) for repeat testosterone, LFTs, CBC, and PSA with reassessment of symptoms.  I have reviewed the above documentation for accuracy and completeness, and I agree with the above.   Vanna Scotland, MD    [facility] [address], [address] [place], [place] [address] [phone]

## 2024-02-13 ENCOUNTER — Telehealth: Payer: Self-pay

## 2024-02-13 NOTE — Telephone Encounter (Signed)
 PA for Clomid started via paper form and faxed to cover my meds, pending decision

## 2024-02-17 NOTE — Telephone Encounter (Signed)
 PA approved 02/13/24 thru 12/09/2024

## 2024-02-27 ENCOUNTER — Emergency Department (HOSPITAL_COMMUNITY)

## 2024-02-27 ENCOUNTER — Observation Stay (HOSPITAL_COMMUNITY)
Admission: EM | Admit: 2024-02-27 | Discharge: 2024-02-28 | Disposition: A | Discharge status: Home / Self Care | Attending: General Surgery | Admitting: General Surgery

## 2024-02-27 ENCOUNTER — Encounter (HOSPITAL_COMMUNITY): Payer: Self-pay

## 2024-02-27 ENCOUNTER — Other Ambulatory Visit: Payer: Self-pay

## 2024-02-27 DIAGNOSIS — I672 Cerebral atherosclerosis: Secondary | ICD-10-CM | POA: Diagnosis not present

## 2024-02-27 DIAGNOSIS — R0781 Pleurodynia: Secondary | ICD-10-CM | POA: Diagnosis not present

## 2024-02-27 DIAGNOSIS — Z955 Presence of coronary angioplasty implant and graft: Secondary | ICD-10-CM | POA: Insufficient documentation

## 2024-02-27 DIAGNOSIS — Z951 Presence of aortocoronary bypass graft: Secondary | ICD-10-CM | POA: Diagnosis not present

## 2024-02-27 DIAGNOSIS — S3991XA Unspecified injury of abdomen, initial encounter: Secondary | ICD-10-CM | POA: Diagnosis not present

## 2024-02-27 DIAGNOSIS — R519 Headache, unspecified: Secondary | ICD-10-CM | POA: Diagnosis not present

## 2024-02-27 DIAGNOSIS — S2231XA Fracture of one rib, right side, initial encounter for closed fracture: Secondary | ICD-10-CM | POA: Diagnosis not present

## 2024-02-27 DIAGNOSIS — R109 Unspecified abdominal pain: Secondary | ICD-10-CM | POA: Insufficient documentation

## 2024-02-27 DIAGNOSIS — E785 Hyperlipidemia, unspecified: Secondary | ICD-10-CM | POA: Diagnosis not present

## 2024-02-27 DIAGNOSIS — N183 Chronic kidney disease, stage 3 unspecified: Secondary | ICD-10-CM | POA: Insufficient documentation

## 2024-02-27 DIAGNOSIS — S199XXA Unspecified injury of neck, initial encounter: Secondary | ICD-10-CM | POA: Diagnosis not present

## 2024-02-27 DIAGNOSIS — I130 Hypertensive heart and chronic kidney disease with heart failure and stage 1 through stage 4 chronic kidney disease, or unspecified chronic kidney disease: Secondary | ICD-10-CM | POA: Insufficient documentation

## 2024-02-27 DIAGNOSIS — M5031 Other cervical disc degeneration,  high cervical region: Secondary | ICD-10-CM | POA: Diagnosis not present

## 2024-02-27 DIAGNOSIS — Z87891 Personal history of nicotine dependence: Secondary | ICD-10-CM | POA: Diagnosis not present

## 2024-02-27 DIAGNOSIS — E1122 Type 2 diabetes mellitus with diabetic chronic kidney disease: Secondary | ICD-10-CM | POA: Insufficient documentation

## 2024-02-27 DIAGNOSIS — I447 Left bundle-branch block, unspecified: Secondary | ICD-10-CM | POA: Diagnosis not present

## 2024-02-27 DIAGNOSIS — R61 Generalized hyperhidrosis: Secondary | ICD-10-CM | POA: Diagnosis not present

## 2024-02-27 DIAGNOSIS — R079 Chest pain, unspecified: Secondary | ICD-10-CM | POA: Diagnosis not present

## 2024-02-27 DIAGNOSIS — S2232XA Fracture of one rib, left side, initial encounter for closed fracture: Secondary | ICD-10-CM | POA: Diagnosis not present

## 2024-02-27 DIAGNOSIS — I2510 Atherosclerotic heart disease of native coronary artery without angina pectoris: Secondary | ICD-10-CM | POA: Insufficient documentation

## 2024-02-27 DIAGNOSIS — S2243XA Multiple fractures of ribs, bilateral, initial encounter for closed fracture: Principal | ICD-10-CM | POA: Insufficient documentation

## 2024-02-27 DIAGNOSIS — Z743 Need for continuous supervision: Secondary | ICD-10-CM | POA: Diagnosis not present

## 2024-02-27 DIAGNOSIS — I5022 Chronic systolic (congestive) heart failure: Secondary | ICD-10-CM | POA: Diagnosis not present

## 2024-02-27 DIAGNOSIS — S3993XA Unspecified injury of pelvis, initial encounter: Secondary | ICD-10-CM | POA: Diagnosis not present

## 2024-02-27 DIAGNOSIS — S2241XA Multiple fractures of ribs, right side, initial encounter for closed fracture: Secondary | ICD-10-CM | POA: Diagnosis not present

## 2024-02-27 DIAGNOSIS — R6889 Other general symptoms and signs: Secondary | ICD-10-CM | POA: Diagnosis not present

## 2024-02-27 LAB — BASIC METABOLIC PANEL
Anion gap: 8 (ref 5–15)
BUN: 20 mg/dL (ref 8–23)
CO2: 25 mmol/L (ref 22–32)
Calcium: 9.1 mg/dL (ref 8.9–10.3)
Chloride: 106 mmol/L (ref 98–111)
Creatinine, Ser: 2.06 mg/dL — ABNORMAL HIGH (ref 0.61–1.24)
GFR, Estimated: 34 mL/min — ABNORMAL LOW (ref >60)
Glucose, Bld: 129 mg/dL — ABNORMAL HIGH (ref 70–99)
Potassium: 3.6 mmol/L (ref 3.5–5.1)
Sodium: 139 mmol/L (ref 135–145)

## 2024-02-27 LAB — CBC WITH DIFFERENTIAL/PLATELET
Abs Immature Granulocytes: 0.03 10*3/uL (ref 0.00–0.07)
Basophils Absolute: 0 10*3/uL (ref 0.0–0.1)
Basophils Relative: 1 %
Eosinophils Absolute: 0.1 10*3/uL (ref 0.0–0.5)
Eosinophils Relative: 2 %
HCT: 48.9 % (ref 39.0–52.0)
Hemoglobin: 16.7 g/dL (ref 13.0–17.0)
Immature Granulocytes: 0 %
Lymphocytes Relative: 30 %
Lymphs Abs: 2.1 10*3/uL (ref 0.7–4.0)
MCH: 30.6 pg (ref 26.0–34.0)
MCHC: 34.2 g/dL (ref 30.0–36.0)
MCV: 89.7 fL (ref 80.0–100.0)
Monocytes Absolute: 0.6 10*3/uL (ref 0.1–1.0)
Monocytes Relative: 8 %
Neutro Abs: 4.2 10*3/uL (ref 1.7–7.7)
Neutrophils Relative %: 59 %
Platelets: 118 10*3/uL — ABNORMAL LOW (ref 150–400)
RBC: 5.45 MIL/uL (ref 4.22–5.81)
RDW: 12.8 % (ref 11.5–15.5)
WBC: 7.1 10*3/uL (ref 4.0–10.5)
nRBC: 0 % (ref 0.0–0.2)

## 2024-02-27 LAB — TROPONIN I (HIGH SENSITIVITY): Troponin I (High Sensitivity): 26 ng/L — ABNORMAL HIGH (ref <18)

## 2024-02-27 MED ORDER — OXYCODONE-ACETAMINOPHEN 5-325 MG PO TABS
2.0000 | ORAL_TABLET | Freq: Once | ORAL | Status: AC
  Administered 2024-02-27: 2 via ORAL
  Filled 2024-02-27: qty 2

## 2024-02-27 MED ORDER — IOHEXOL 350 MG/ML SOLN
75.0000 mL | Freq: Once | INTRAVENOUS | Status: AC | PRN
  Administered 2024-02-27: 75 mL via INTRAVENOUS

## 2024-02-27 MED ORDER — MORPHINE SULFATE (PF) 4 MG/ML IV SOLN
4.0000 mg | Freq: Once | INTRAVENOUS | Status: AC
  Administered 2024-02-27: 4 mg via INTRAVENOUS
  Filled 2024-02-27: qty 1
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
## 2024-02-28 DIAGNOSIS — S2243XA Multiple fractures of ribs, bilateral, initial encounter for closed fracture: Secondary | ICD-10-CM | POA: Diagnosis not present

## 2024-02-28 LAB — CBC
HCT: 45.3 % (ref 39.0–52.0)
Hemoglobin: 15.2 g/dL (ref 13.0–17.0)
MCH: 30 pg (ref 26.0–34.0)
MCHC: 33.6 g/dL (ref 30.0–36.0)
MCV: 89.5 fL (ref 80.0–100.0)
Platelets: 103 10*3/uL — ABNORMAL LOW (ref 150–400)
RBC: 5.06 MIL/uL (ref 4.22–5.81)
RDW: 12.9 % (ref 11.5–15.5)
WBC: 10 10*3/uL (ref 4.0–10.5)
nRBC: 0 % (ref 0.0–0.2)

## 2024-02-28 LAB — TROPONIN I (HIGH SENSITIVITY): Troponin I (High Sensitivity): 24 ng/L — ABNORMAL HIGH (ref <18)

## 2024-02-28 LAB — BASIC METABOLIC PANEL
Anion gap: 4 — ABNORMAL LOW (ref 5–15)
BUN: 16 mg/dL (ref 8–23)
CO2: 24 mmol/L (ref 22–32)
Calcium: 8.4 mg/dL — ABNORMAL LOW (ref 8.9–10.3)
Chloride: 108 mmol/L (ref 98–111)
Creatinine, Ser: 1.78 mg/dL — ABNORMAL HIGH (ref 0.61–1.24)
GFR, Estimated: 41 mL/min — ABNORMAL LOW (ref >60)
Glucose, Bld: 133 mg/dL — ABNORMAL HIGH (ref 70–99)
Potassium: 3.9 mmol/L (ref 3.5–5.1)
Sodium: 136 mmol/L (ref 135–145)

## 2024-02-28 LAB — HIV ANTIBODY (ROUTINE TESTING W REFLEX): HIV Screen 4th Generation wRfx: NONREACTIVE

## 2024-02-28 MED ORDER — ASPIRIN 81 MG PO TBEC
81.0000 mg | DELAYED_RELEASE_TABLET | Freq: Every day | ORAL | Status: DC
Start: 2024-02-28 — End: 2024-02-28

## 2024-02-28 MED ORDER — METHOCARBAMOL 1000 MG/10ML IJ SOLN: 500.0000 mg | Freq: Three times a day (TID) | INTRAMUSCULAR | Status: DC

## 2024-02-28 MED ORDER — CLOMIPHENE CITRATE 50 MG PO TABS: 25.0000 mg | ORAL_TABLET | Freq: Every day | ORAL | Status: DC

## 2024-02-28 MED ORDER — LORATADINE 10 MG PO TABS
10.0000 mg | ORAL_TABLET | Freq: Every day | ORAL | Status: DC
Start: 2024-02-28 — End: 2024-02-28

## 2024-02-28 MED ORDER — METHOCARBAMOL 500 MG PO TABS: 500.0000 mg | ORAL_TABLET | Freq: Three times a day (TID) | ORAL | 0 refills | Status: DC | PRN

## 2024-02-28 MED ORDER — DAPAGLIFLOZIN PROPANEDIOL 10 MG PO TABS: 10.0000 mg | ORAL_TABLET | Freq: Every day | ORAL | Status: DC

## 2024-02-28 MED ORDER — ONDANSETRON HCL 4 MG/2ML IJ SOLN: 4.0000 mg | Freq: Four times a day (QID) | INTRAMUSCULAR | Status: DC | PRN

## 2024-02-28 MED ORDER — HYDRALAZINE HCL 20 MG/ML IJ SOLN: 10.0000 mg | INTRAMUSCULAR | Status: DC | PRN

## 2024-02-28 MED ORDER — METHOCARBAMOL 500 MG PO TABS
500.0000 mg | ORAL_TABLET | Freq: Three times a day (TID) | ORAL | Status: DC
  Administered 2024-02-28: 500 mg via ORAL
  Filled 2024-02-28: qty 1

## 2024-02-28 MED ORDER — ENOXAPARIN SODIUM 30 MG/0.3ML IJ SOSY: 30.0000 mg | PREFILLED_SYRINGE | Freq: Two times a day (BID) | INTRAMUSCULAR | Status: DC

## 2024-02-28 MED ORDER — OXYCODONE HCL 5 MG PO TABS: 5.0000 mg | ORAL_TABLET | ORAL | Status: DC | PRN

## 2024-02-28 MED ORDER — HYDROMORPHONE HCL 1 MG/ML IJ SOLN: 1.0000 mg | INTRAMUSCULAR | Status: DC | PRN

## 2024-02-28 MED ORDER — EPLERENONE 25 MG PO TABS
50.0000 mg | ORAL_TABLET | Freq: Every day | ORAL | Status: DC
  Filled 2024-02-28: qty 2

## 2024-02-28 MED ORDER — SACUBITRIL-VALSARTAN 49-51 MG PO TABS
1.0000 | ORAL_TABLET | Freq: Two times a day (BID) | ORAL | Status: DC
  Administered 2024-02-28: 1 via ORAL
  Filled 2024-02-28: qty 1

## 2024-02-28 MED ORDER — ATORVASTATIN CALCIUM 40 MG PO TABS: 80.0000 mg | ORAL_TABLET | Freq: Every day | ORAL | Status: DC

## 2024-02-28 MED ORDER — ACETAMINOPHEN 500 MG PO TABS
1000.0000 mg | ORAL_TABLET | Freq: Four times a day (QID) | ORAL | Status: DC
  Administered 2024-02-28: 1000 mg via ORAL
  Filled 2024-02-28: qty 2

## 2024-02-28 MED ORDER — DOCUSATE SODIUM 100 MG PO CAPS
100.0000 mg | ORAL_CAPSULE | Freq: Two times a day (BID) | ORAL | Status: DC
  Administered 2024-02-28: 100 mg via ORAL
  Filled 2024-02-28: qty 1

## 2024-02-28 MED ORDER — ACETAMINOPHEN 500 MG PO TABS: 1000.0000 mg | ORAL_TABLET | Freq: Four times a day (QID) | ORAL | Status: DC | PRN

## 2024-02-28 MED ORDER — METOPROLOL TARTRATE 5 MG/5ML IV SOLN: 5.0000 mg | Freq: Four times a day (QID) | INTRAVENOUS | Status: DC | PRN

## 2024-02-28 MED ORDER — METOPROLOL SUCCINATE ER 25 MG PO TB24: 50.0000 mg | ORAL_TABLET | Freq: Every day | ORAL | Status: DC

## 2024-02-28 MED ORDER — OXYCODONE HCL 5 MG PO TABS: 10.0000 mg | ORAL_TABLET | ORAL | Status: DC | PRN

## 2024-02-28 MED ORDER — OXYCODONE HCL 5 MG PO TABS: 5.0000 mg | ORAL_TABLET | ORAL | 0 refills | Status: DC | PRN

## 2024-02-28 MED ORDER — POLYETHYLENE GLYCOL 3350 17 G PO PACK: 17.0000 g | PACK | Freq: Every day | ORAL | Status: DC | PRN

## 2024-02-28 MED ORDER — SPIRONOLACTONE 25 MG PO TABS: 25.0000 mg | ORAL_TABLET | Freq: Every day | ORAL | Status: DC

## 2024-02-28 MED ORDER — DIGOXIN 125 MCG PO TABS: 125.0000 ug | ORAL_TABLET | Freq: Every day | ORAL | Status: DC

## 2024-02-28 MED ORDER — HYDROMORPHONE HCL 1 MG/ML IJ SOLN: 0.5000 mg | INTRAMUSCULAR | Status: DC | PRN

## 2024-02-28 MED ORDER — ONDANSETRON 4 MG PO TBDP
4.0000 mg | ORAL_TABLET | Freq: Four times a day (QID) | ORAL | Status: DC | PRN
Start: 2024-02-28 — End: 2024-02-28

## 2024-02-28 NOTE — ED Notes (Signed)
 PT requested and given something to drink.
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
## 2024-02-28 NOTE — ED Notes (Signed)
 Pt ambulated to the restroom without assistance. He also walked down the hall and did well, no issues, no concerns.

## 2024-02-28 NOTE — ED Notes (Signed)
 Pt remains in Hallway 9 awaiting room assignment. Pt provided with incentive spirometer at bedside for when awake. No resp distress. Pt reports pain manageable. Was given PO Robaxin for pain management. Pillows x 3 provided for pt comfort. Denies any additional needs.

## 2024-03-02 ENCOUNTER — Telehealth: Payer: Self-pay | Admitting: Family Medicine

## 2024-03-02 NOTE — Telephone Encounter (Signed)
 Does he have pain meds to use in the meantime?

## 2024-03-02 NOTE — Telephone Encounter (Signed)
 Patient states that he still has some medication. He is just wanting to know can the dose be increased to help with the pain

## 2024-03-02 NOTE — Telephone Encounter (Signed)
 Patient notified
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
## 2024-03-02 NOTE — Telephone Encounter (Signed)
 He could try taking 1.5 of the oxycodone tabs per dose to see if that helps with the pain.  I would start with that.  Let me know how that goes.  Sedation caution on med.  Thanks.

## 2024-03-03 ENCOUNTER — Encounter: Payer: Self-pay | Admitting: Family Medicine

## 2024-03-03 ENCOUNTER — Ambulatory Visit (INDEPENDENT_AMBULATORY_CARE_PROVIDER_SITE_OTHER): Admitting: Family Medicine

## 2024-03-03 ENCOUNTER — Inpatient Hospital Stay: Admitting: Family Medicine

## 2024-03-03 DIAGNOSIS — I5022 Chronic systolic (congestive) heart failure: Secondary | ICD-10-CM | POA: Diagnosis not present

## 2024-03-03 DIAGNOSIS — E1159 Type 2 diabetes mellitus with other circulatory complications: Secondary | ICD-10-CM | POA: Diagnosis not present

## 2024-03-03 MED ORDER — OXYCODONE HCL 5 MG PO TABS: 5.0000 mg | ORAL_TABLET | Freq: Two times a day (BID) | ORAL | 0 refills | Status: DC | PRN

## 2024-03-03 MED ORDER — VITAMIN D3 125 MCG (5000 UT) PO CAPS: 5000.0000 [IU] | ORAL_CAPSULE | Freq: Every day | ORAL | Status: AC
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
## 2024-03-03 NOTE — Patient Instructions (Addendum)
 If you change entrestro to twice a day and get lightheaded, then let us or cardiology know.   Let me know if the medicine isn't helping.  Keep taking vitamin D.   Take care.  Glad to see you.

## 2024-03-04 NOTE — Assessment & Plan Note (Signed)
 Previous MALB d/w pt.  No change in plan based on current meds and prior labs.

## 2024-03-04 NOTE — Assessment & Plan Note (Signed)
 With multiple rib fractures.  Continue taking vitamin D.  Continue oxycodone 7.5 mg per dose for now.  He can take up to 10 mg at a time if needed.  New prescription sent.  Not sedated.  Okay for outpatient follow-up.  Update me as needed.  Routine cautions given to patient.  Not short of breath.  Normal breath sounds.  Okay for outpatient follow-up.

## 2024-03-04 NOTE — Assessment & Plan Note (Signed)
 Discussed taking Entresto twice a day and updating me or cardiology if not tolerated.

## 2024-04-29 ENCOUNTER — Other Ambulatory Visit: Payer: Self-pay

## 2024-05-01 ENCOUNTER — Other Ambulatory Visit: Payer: Medicare Other

## 2024-05-01 DIAGNOSIS — E291 Testicular hypofunction: Secondary | ICD-10-CM

## 2024-05-02 ENCOUNTER — Other Ambulatory Visit (HOSPITAL_COMMUNITY): Payer: Self-pay | Admitting: Cardiology

## 2024-05-02 LAB — CBC WITH DIFFERENTIAL/PLATELET
Basophils Absolute: 0 10*3/uL (ref 0.0–0.2)
Basos: 1 %
EOS (ABSOLUTE): 0.2 10*3/uL (ref 0.0–0.4)
Eos: 4 %
Hematocrit: 52.2 % — ABNORMAL HIGH (ref 37.5–51.0)
Hemoglobin: 17 g/dL (ref 13.0–17.7)
Immature Grans (Abs): 0 10*3/uL (ref 0.0–0.1)
Immature Granulocytes: 0 %
Lymphocytes Absolute: 1.4 10*3/uL (ref 0.7–3.1)
Lymphs: 29 %
MCH: 30.6 pg (ref 26.6–33.0)
MCHC: 32.6 g/dL (ref 31.5–35.7)
MCV: 94 fL (ref 79–97)
Monocytes Absolute: 0.4 10*3/uL (ref 0.1–0.9)
Monocytes: 9 %
Neutrophils Absolute: 2.8 10*3/uL (ref 1.4–7.0)
Neutrophils: 57 %
Platelets: 131 10*3/uL — ABNORMAL LOW (ref 150–450)
RBC: 5.56 x10E6/uL (ref 4.14–5.80)
RDW: 12.8 % (ref 11.6–15.4)
WBC: 4.9 10*3/uL (ref 3.4–10.8)

## 2024-05-02 LAB — TESTOSTERONE: Testosterone: 587 ng/dL (ref 264–916)

## 2024-05-02 LAB — HEPATIC FUNCTION PANEL
ALT: 17 IU/L (ref 0–44)
AST: 20 IU/L (ref 0–40)
Albumin: 4 g/dL (ref 3.9–4.9)
Alkaline Phosphatase: 79 IU/L (ref 44–121)
Bilirubin Total: 0.6 mg/dL (ref 0.0–1.2)
Bilirubin, Direct: 0.16 mg/dL (ref 0.00–0.40)
Total Protein: 6.3 g/dL (ref 6.0–8.5)

## 2024-05-02 LAB — PSA: Prostate Specific Ag, Serum: 6.8 ng/mL — ABNORMAL HIGH (ref 0.0–4.0)

## 2024-05-05 ENCOUNTER — Ambulatory Visit: Payer: Medicare Other | Admitting: Urology

## 2024-05-05 VITALS — BP 127/80 | HR 80 | Ht 69.0 in | Wt 205.0 lb

## 2024-05-05 DIAGNOSIS — R350 Frequency of micturition: Secondary | ICD-10-CM

## 2024-05-05 DIAGNOSIS — N401 Enlarged prostate with lower urinary tract symptoms: Secondary | ICD-10-CM

## 2024-05-05 DIAGNOSIS — N528 Other male erectile dysfunction: Secondary | ICD-10-CM | POA: Diagnosis not present

## 2024-05-05 MED ORDER — TADALAFIL 5 MG PO TABS: 5.0000 mg | ORAL_TABLET | Freq: Every day | ORAL | 11 refills | Status: AC

## 2024-05-05 NOTE — Patient Instructions (Signed)

## 2024-05-05 NOTE — Progress Notes (Addendum)
 I,Amy L Pierron,acting as a scribe for Dustin Gimenez, MD.,have documented all relevant documentation on the behalf of Dustin Gimenez, MD,as directed by  Dustin Gimenez, MD while in the presence of Dustin Gimenez, MD.  05/05/2024 12:57 PM   Kela Patch Marrie Sizer 1956-02-04 [id number]  Referring provider: Donnie Galea, MD [address] [place],  [place] [address]  Chief Complaint  Patient presents with   Follow-up    HPI: 68 year-old male with a personal history of low-normal range testosterone  and mildly elevated PSA presents today for further evaluation.   His DRE at last evaluation showed prostatomegaly. He was previously started on Clomid  25 mg daily, which increased his testosterone  levels to almost 587. His PSA has risen from 5.77 on 12/24/2023 to 6.8 on 05/01/2024, which is concerning.   He reports no improvement in symptoms, suggesting testosterone  may not be the cause.   He inquired about having a prescription for Cialis for erectile dysfunction.  He takes a baby aspirin  each day.   PMH: Past Medical History:  Diagnosis Date   CAD (coronary artery disease)    a. STEMI 2004 with occluded Cx and 80% RCA s/p DESx2. b. CABG 03/2021.   Chronic systolic CHF (congestive heart failure) (HCC)    CKD (chronic kidney disease), stage III (HCC)    HCV antibody positive    previous eval at [facility] hepatology clinic   Ischemic cardiomyopathy    Pre-diabetes    STEMI (ST elevation myocardial infarction) (HCC) 2004   with totally occluded CFX (infarct related artery) and 80% mRCA stenosis. Taxus DES to both vessles. EF was 50-55% by LV-gram   Thrombocytopenia [facility])     Surgical History: Past Surgical History:  Procedure Laterality Date   COLONOSCOPY WITH PROPOFOL  N/A 01/07/2024   Procedure: COLONOSCOPY WITH PROPOFOL ;  Surgeon: Luke Salaam, MD;  Location: [facility] ENDOSCOPY;  Service: Gastroenterology;  Laterality: N/A;   CORONARY ANGIOPLASTY WITH STENT PLACEMENT  08/22/03    CORONARY ARTERY BYPASS GRAFT N/A 03/22/2021   Procedure: CORONARY ARTERY BYPASS GRAFTING (CABG) X  THREE USING LEFT INTERNAL MAMMARY ARTERY AND RIGHT ENDOSCOPIC SAPHENOUS VEIN HARVEST CONDUITS;  Surgeon: Hilarie Lovely, MD;  Location: [facility] OR;  Service: Open Heart Surgery;  Laterality: N/A;   RIGHT/LEFT HEART CATH AND CORONARY ANGIOGRAPHY N/A 03/20/2021   Procedure: RIGHT/LEFT HEART CATH AND CORONARY ANGIOGRAPHY;  Surgeon: Millicent Ally, MD;  Location: [facility] INVASIVE CV LAB;  Service: Cardiovascular;  Laterality: N/A;   TEE WITHOUT CARDIOVERSION N/A 03/22/2021   Procedure: TRANSESOPHAGEAL ECHOCARDIOGRAM (TEE);  Surgeon: Hilarie Lovely, MD;  Location: [facility] OR;  Service: Open Heart Surgery;  Laterality: N/A;    Home Medications:  Allergies as of 05/05/2024       Reactions   Spironolactone     Gynecomastia         Medication List        Accurate as of May 05, 2024 12:57 PM. If you have any questions, ask your nurse or doctor.          acetaminophen  500 MG tablet Commonly known as: TYLENOL  Take 2 tablets (1,000 mg total) by mouth every 6 (six) hours as needed.   aspirin  EC 81 MG tablet Take 1 tablet (81 mg total) by mouth daily.   atorvastatin  80 MG tablet Commonly known as: LIPITOR  Take 1 tablet by mouth once daily   clomiPHENE  50 MG tablet Commonly known as: CLOMID  Take 0.5 tablets (25 mg total) by mouth daily.   dapagliflozin  propanediol 10  MG Tabs tablet Commonly known as: Farxiga  Take 1 tablet (10 mg total) by mouth daily before breakfast.   digoxin  0.125 MG tablet Commonly known as: LANOXIN  Take 1 tablet (125 mcg total) by mouth daily.   Entresto  49-51 MG Generic drug: sacubitril -valsartan  Take 1 tablet by mouth 2 (two) times daily. What changed: when to take this   eplerenone  50 MG tablet Commonly known as: INSPRA  Take 1 tablet by mouth once daily   furosemide  20 MG tablet Commonly known as: LASIX  TAKE ONE TABLET BY MOUTH ONCE DAILY FOR THREE DAYS.  THEN TAKE AS NEEDED FOR SHORTNESS OF BREATH   loratadine  10 MG tablet Commonly known as: CLARITIN  Take 10 mg by mouth daily.   methocarbamol  500 MG tablet Commonly known as: ROBAXIN  Take 1 tablet (500 mg total) by mouth every 8 (eight) hours as needed for muscle spasms.   metoprolol  succinate 50 MG 24 hr tablet Commonly known as: TOPROL -XL Take 1 tablet by mouth once daily   multivitamin with minerals Tabs tablet Take 1 tablet by mouth daily.   oxyCODONE  5 MG immediate release tablet Commonly known as: Oxy IR/ROXICODONE  Take 1-2 tablets (5-10 mg total) by mouth 2 (two) times daily as needed (pain).   Ozempic  (0.25 or 0.5 MG/DOSE) 2 MG/3ML Sopn Generic drug: Semaglutide (0.25 or 0.5MG /DOS) Inject 0.5 mg once weekly   tadalafil 5 MG tablet Commonly known as: CIALIS Take 1 tablet (5 mg total) by mouth daily.   Vitamin D3 125 MCG (5000 UT) Caps Take 1 capsule (5,000 Units total) by mouth daily.        Allergies:  Allergies  Allergen Reactions   Spironolactone      Gynecomastia     Family History: Family History  Problem Relation Age of Onset   Aneurysm Father        brain   Stroke Father    Other Mother        ASCVD   Hypertension Mother    Heart attack Other        uncle   Diabetes Child    Diabetes Child    Colon cancer Neg Hx    Prostate cancer Neg Hx     Social History:  reports that he quit smoking about 20 years ago. His smoking use included cigarettes. He started smoking about 30 years ago. He has a 10 pack-year smoking history. He has never used smokeless tobacco. He reports that he does not drink alcohol and does not use drugs.   Physical Exam: BP 127/80   Pulse 80   Ht 5\' 9"  (1.753 m)   Wt 205 lb (93 kg)   BMI 30.27 kg/m   Constitutional:  Alert and oriented, No acute distress. HEENT: [place] AT, moist mucus membranes.  Trachea midline, no masses. Neurologic: Grossly intact, no focal deficits, moving all 4 extremities. Psychiatric: Normal mood  and affect.  Laboratory Data: Lab Results  Component Value Date   WBC 4.9 05/01/2024   HGB 17.0 05/01/2024   HCT 52.2 (H) 05/01/2024   MCV 94 05/01/2024   PLT 131 (L) 05/01/2024    Assessment & Plan:    1. Elevated/ rising PSA  - Significant rise in the last 3 months from his already elevated baseline. The rise in PSA coincides with increased testosterone  levels from Clomid  therapy.  - Recommend to hold Clomid  due to the potential risk of fueling prostate cancer. Options for further evaluation include a prostate biopsy or MRI, with a biopsy being recommended for definitive diagnosis.   -  We discussed prostate biopsy in detail including the procedure itself, the risks of blood in the urine, stool, and ejaculate, serious infection, and discomfort. He is willing to proceed with this as discussed.  2. Hypogonadism - Testosterone  levels have increased to nearly 600 with Clomid , but he reports no symptomatic improvement, suggesting that testosterone  may not be the cause of his symptoms. - Plan to hold Clomid  therapy as it is not providing symptomatic relief and may be contributing to PSA elevation. Offered to check free testosterone  for academic purposes, but it will not alter the current management plan.  3. Erectile Dysfunction (ED) and Benign Prostatic Hyperplasia (BPH) - Prescribed Cialis 5 mg daily for ED and BPH symptoms. This will also address his urinary symptoms as it relates to prostatomegaly.  -  Instructed that on days requiring increased activity, he may take up to 20 mg total by taking additional 5 mg tablets, ensuring not to exceed 20 mg in a day.   Return in about 3 months (around 08/05/2024) for prostate biopsy.  I have reviewed the above documentation for accuracy and completeness, and I agree with the above.   Dustin Gimenez, MD   [facility] [address], [address] [place], [place] [address] [phone]

## 2024-05-13 ENCOUNTER — Other Ambulatory Visit: Payer: Self-pay | Admitting: Family Medicine

## 2024-06-04 ENCOUNTER — Other Ambulatory Visit: Payer: Self-pay | Admitting: Family Medicine

## 2024-06-10 ENCOUNTER — Other Ambulatory Visit: Payer: Self-pay | Admitting: *Deleted

## 2024-06-10 ENCOUNTER — Other Ambulatory Visit: Admitting: Urology

## 2024-06-10 ENCOUNTER — Encounter: Payer: Self-pay | Admitting: *Deleted

## 2024-06-10 DIAGNOSIS — R972 Elevated prostate specific antigen [PSA]: Secondary | ICD-10-CM

## 2024-06-16 ENCOUNTER — Ambulatory Visit
Admission: RE | Admit: 2024-06-16 | Discharge: 2024-06-16 | Disposition: A | Discharge status: Home / Self Care | Source: Ambulatory Visit | Attending: Urology | Admitting: Urology

## 2024-06-16 DIAGNOSIS — R972 Elevated prostate specific antigen [PSA]: Secondary | ICD-10-CM | POA: Diagnosis present

## 2024-06-16 MED ORDER — GADOBUTROL 1 MMOL/ML IV SOLN
9.0000 mL | Freq: Once | INTRAVENOUS | Status: AC | PRN
  Administered 2024-06-16: 9 mL via INTRAVENOUS

## 2024-06-17 ENCOUNTER — Encounter: Payer: Self-pay | Admitting: Urology

## 2024-06-17 ENCOUNTER — Ambulatory Visit: Admitting: Urology

## 2024-06-17 VITALS — BP 104/71 | HR 97 | Ht 69.0 in | Wt 205.0 lb

## 2024-06-17 DIAGNOSIS — R972 Elevated prostate specific antigen [PSA]: Secondary | ICD-10-CM

## 2024-06-17 NOTE — Progress Notes (Signed)
 I, Edward [name], acting as a scribe for Edward JAYSON Barba, MD., have documented all relevant documentation on the behalf of Edward JAYSON Barba, MD, as directed by Edward JAYSON Barba, MD while in the presence of Edward JAYSON Barba, MD.  06/17/2024 3:34 PM   Edward [name] [birth year] [id number]  Referring provider: Cleatus Arlyss RAMAN, MD [address] [place],  [place] [address]  Chief Complaint  Patient presents with   prostate biopsy f/u    HPI: Edward [name] is a 68 y.o. male presents for follow-up visit  Refer to Dr Edward [name] note of 05/05/24. He was scheduled for prostate biopsy with me last week, however had a benign DRE, and I recommended prostate MRI prior to prostate biopsy. Prostate MRI was performed yesterday. There was a PIRADS 5 lesion left mid-anterior peripheral zone, with probable extracapsular extension. The lesion measured 10 mm.    PMH: Past Medical History:  Diagnosis Date   CAD (coronary artery disease)    a. STEMI 2004 with occluded Cx and 80% RCA s/p DESx2. b. CABG 03/2021.   Chronic systolic CHF (congestive heart failure) (HCC)    CKD (chronic kidney disease), stage III (HCC)    HCV antibody positive    previous eval at [facility] hepatology clinic   Ischemic cardiomyopathy    Pre-diabetes    STEMI (ST elevation myocardial infarction) (HCC) 2004   with totally occluded CFX (infarct related artery) and 80% mRCA stenosis. Taxus DES to both vessles. EF was 50-55% by LV-gram   Thrombocytopenia [facility])     Surgical History: Past Surgical History:  Procedure Laterality Date   COLONOSCOPY WITH PROPOFOL  N/A 01/07/2024   Procedure: COLONOSCOPY WITH PROPOFOL ;  Surgeon: Edward Bi, MD;  Location: [facility] ENDOSCOPY;  Service: Gastroenterology;  Laterality: N/A;   CORONARY ANGIOPLASTY WITH STENT PLACEMENT  08/22/03   CORONARY ARTERY BYPASS GRAFT N/A 03/22/2021   Procedure: CORONARY ARTERY BYPASS GRAFTING (CABG) X  THREE USING LEFT INTERNAL MAMMARY ARTERY AND RIGHT  ENDOSCOPIC SAPHENOUS VEIN HARVEST CONDUITS;  Surgeon: Edward Linnie KIDD, MD;  Location: [facility] OR;  Service: Open Heart Surgery;  Laterality: N/A;   RIGHT/LEFT HEART CATH AND CORONARY ANGIOGRAPHY N/A 03/20/2021   Procedure: RIGHT/LEFT HEART CATH AND CORONARY ANGIOGRAPHY;  Surgeon: Edward Debby LABOR, MD;  Location: [facility] INVASIVE CV LAB;  Service: Cardiovascular;  Laterality: N/A;   TEE WITHOUT CARDIOVERSION N/A 03/22/2021   Procedure: TRANSESOPHAGEAL ECHOCARDIOGRAM (TEE);  Surgeon: Edward Linnie KIDD, MD;  Location: [facility] OR;  Service: Open Heart Surgery;  Laterality: N/A;    Home Medications:  Allergies as of 06/17/2024       Reactions   Spironolactone     Gynecomastia         Medication List        Accurate as of June 17, 2024  3:34 PM. If you have any questions, ask your nurse or doctor.          acetaminophen  500 MG tablet Commonly known as: TYLENOL  Take 2 tablets (1,000 mg total) by mouth every 6 (six) hours as needed.   aspirin  EC 81 MG tablet Take 1 tablet (81 mg total) by mouth daily.   atorvastatin  80 MG tablet Commonly known as: LIPITOR  Take 1 tablet by mouth once daily   clomiPHENE  50 MG tablet Commonly known as: CLOMID  Take 0.5 tablets (25 mg total) by mouth daily.   dapagliflozin  propanediol 10 MG Tabs tablet Commonly known as: Farxiga  Take 1 tablet (10 mg total) by mouth daily before  breakfast.   digoxin  0.125 MG tablet Commonly known as: LANOXIN  Take 1 tablet (125 mcg total) by mouth daily.   Entresto  49-51 MG Generic drug: sacubitril -valsartan  Take 1 tablet by mouth 2 (two) times daily. What changed: when to take this   eplerenone  50 MG tablet Commonly known as: INSPRA  Take 1 tablet by mouth once daily   furosemide  20 MG tablet Commonly known as: LASIX  TAKE ONE TABLET BY MOUTH ONCE DAILY FOR THREE DAYS. THEN TAKE AS NEEDED FOR SHORTNESS OF BREATH   loratadine  10 MG tablet Commonly known as: CLARITIN  Take 10 mg by mouth daily.   methocarbamol  500  MG tablet Commonly known as: ROBAXIN  Take 1 tablet (500 mg total) by mouth every 8 (eight) hours as needed for muscle spasms.   metoprolol  succinate 50 MG 24 hr tablet Commonly known as: TOPROL -XL Take 1 tablet by mouth once daily   multivitamin with minerals Tabs tablet Take 1 tablet by mouth daily.   oxyCODONE  5 MG immediate release tablet Commonly known as: Oxy IR/ROXICODONE  Take 1-2 tablets (5-10 mg total) by mouth 2 (two) times daily as needed (pain).   Ozempic  (0.25 or 0.5 MG/DOSE) 2 MG/3ML Sopn Generic drug: Semaglutide (0.25 or 0.5MG /DOS) INJECT 0.5MG  UNDER THE SKIN ONCE WEEKLY   tadalafil  5 MG tablet Commonly known as: CIALIS  Take 1 tablet (5 mg total) by mouth daily.   Vitamin D3 125 MCG (5000 UT) Caps Take 1 capsule (5,000 Units total) by mouth daily.        Allergies:  Allergies  Allergen Reactions   Spironolactone      Gynecomastia     Family History: Family History  Problem Relation Age of Onset   Aneurysm Father        brain   Stroke Father    Other Mother        ASCVD   Hypertension Mother    Heart attack Other        uncle   Diabetes Child    Diabetes Child    Colon cancer Neg Hx    Prostate cancer Neg Hx     Social History:  reports that he quit smoking about 20 years ago. His smoking use included cigarettes. He started smoking about 30 years ago. He has a 10 pack-year smoking history. He has never used smokeless tobacco. He reports that he does not drink alcohol and does not use drugs.   Physical Exam: BP 104/71   Pulse 97   Ht 5' 9 (1.753 m)   Wt 205 lb (93 kg)   BMI 30.27 kg/m   Constitutional:  Alert and oriented, No acute distress. HEENT: [place] AT, moist mucus membranes.  Trachea midline, no masses. Cardiovascular: No clubbing, cyanosis, or edema. Respiratory: Normal respiratory effort, no increased work of breathing. GI: Abdomen is soft, nontender, nondistended, no abdominal masses GU: Prostate 50 gram, smooth. No nodules or  induration appreciated.  Skin: No rashes, bruises or suspicious lesions. Neurologic: Grossly intact, no focal deficits, moving all 4 extremities. Psychiatric: Normal mood and affect.    Pertinent Imaging: MRI was personally reviewed and interpreted.  MRI  EXAM: MR PROSTATE WITHOUT AND WITH CONTRAST   TECHNIQUE: Multiplanar multisequence MRI images were obtained of the pelvis centered about the prostate. Pre and post contrast images were obtained.   CONTRAST:  9mL GADAVIST  GADOBUTROL  1 MMOL/ML IV SOLN   COMPARISON:  None Available.   FINDINGS: Prostate:   -- Peripheral Zone: Focal site of restricted diffusion is seen in the left anterior  mid gland, which corresponds with a T2 hypointense lesion measuring 11 x 7 mm on image 16/4. This shows marked ADC hypointensity and DWI hyperintensity, as well as early focal contrast enhancement. No other suspicious lesions identified.   -- Transition/Central Zone: Enlarged with prominent median lobe hypertrophy and diffuse involvement by BPH nodules. No suspicious appearing lesions identified.   -- Measurements:  6.0 by 4.3 x 5.7 cm   Transcapsular spread: Focal capsular bulge at the site of the above described nodule is highly suspicious for extracapsular extension.   Seminal vesicle involvement:  Absent   Neurovascular bundle involvement:  Absent   Pelvic adenopathy: None visualized   Bone metastasis: None visualized   Other:  None   IMPRESSION: 10 mm lesion in the left mid anterior peripheral zone, with probable extracapsular extension, highly suspicious for high-grade prostate carcinoma. PI-RADS 5 (v2.1): Very high (clinically significant cancer highly likely)   No evidence of pelvic metastatic disease.   (I have post-processed this exam in the DynaCAD application for potential fusion-guided biopsy.).     Electronically Signed   By: Norleen DELENA Kil M.D.   On: 06/17/2024 10:15    Assessment & Plan:    1.  Elevated PSA PIRADS 5 lesion left mid-anterior peripheral zone.  MRI findings were discussed in detail and recommend scheduling MR fusion biopsy. The procedure was discussed including potential risks of bleeding and infection/sepsis.  He declined pre-procedure anxiolytic. Follow-up scheduled for 10 days post-biopsy for pathology results.  [facility] [address] [place], [place] [address] [phone]

## 2024-06-17 NOTE — Patient Instructions (Signed)
 Transrectal Prostate Biopsy/Fusion Biopsy Patient Education and Post Procedure Instructions    -Definition A prostate biopsy is the removal of a small amount of tissue from the prostate gland. The tissue is examined to determine whether there is cancer.  -Reasons for Procedure A prostate biopsy is usually done after an abnormal finding by: Digital rectal exam Prostate specific antigen (PSA) blood test A prostate biopsy is the only way to find out if cancer cells are present.  -Possible Complications Problems from the procedure are rare, but all procedures have some risk including: Infection Bruising or lengthy bleeding from the rectum, or in urine or semen Difficulty urinating Reactions to anesthesia Factors that may increase the risk of complications include: Smoking History of bleeding disorders or easy bruising Use of any medications, over-the-counter medications, or herbal supplements Sensitivity or allergy to latex, medications, or anesthesia.  -Prior to Procedure Talk to your doctor about your medications. Blood thinning medications including aspirin should be stopped 1 week prior to procedure. If prescribed by your cardiologist we may need approval before stopping medications. Use a Fleets enema 2 hours before the procedure. Can be purchased at your pharmacy. Antibiotics will be administered in the clinic prior to procedure.  Please make sure you eat a light meal prior to coming in for your appointment. This can help prevent lightheadedness during the procedure and upset stomach from antibiotics. Please bring someone with you to the procedure to drive you home.  -Anesthesia Transrectal biopsy: Local anesthesia--Just the area that is being operated on is numbed using an injectable anesthetic.  -Description of the Procedure Transrectal biopsy--Your doctor will insert a small ultrasound device into the rectum. This device will produce sound waves to create an image of the  prostate. These images will help guide placement of the needle. Your doctor will then insert the needle through the wall of the rectum and into the prostate gland. The procedure should take approximately 15-30 minutes.  -Will It Hurt? You may have discomfort and soreness at the biopsy site. Pain and discomfort after the procedure can be managed with medications.  -Postoperative Care When you return home after the procedure, do the following to help ensure a smooth recovery: Stay hydrated. Drink plenty of fluids for the next few days. Avoid difficult physical activity the day and evening of the procedure. Keep in mind that you may see blood in your urine, stool, or semen for several days. Resume any medications that were stopped when you are advised to do so.  After the sample is taken, it will be sent to a pathologist for examination under a microscope. This doctor will analyze the sample for cancer. You will be scheduled for an appointment to discuss results. If cancer is present, your doctor will work with you to develop a treatment plan.   -Call Your Doctor or Seek Immediate Medical Attention It is important to monitor your recovery. Alert your doctor to any problems. If any of the following occur, call your doctor or go to the emergency room: Fever 100.5 or greater within 1 week post procedure go directly to ER Call the office for: Blood in the urine more than 1 week or in semen for more than 6 weeks post-biopsy Pain that you cannot control with the medications you have been given Pain, burning, urgency, or frequency of urination Cough, shortness of breath, or chest pain- if severe go to ER Heavy rectal bleeding or bleeding that lasts more than 1 week after the biopsy If you have  any questions or concerns please contact our office at [facility]  [facility] [place] [address] [place], [place] [address]  [phone]

## 2024-06-30 ENCOUNTER — Other Ambulatory Visit: Payer: Self-pay | Admitting: Family Medicine

## 2024-07-17 ENCOUNTER — Other Ambulatory Visit (HOSPITAL_COMMUNITY): Payer: Self-pay | Admitting: Cardiology

## 2024-07-30 ENCOUNTER — Other Ambulatory Visit: Payer: Self-pay | Admitting: Urology

## 2024-07-30 ENCOUNTER — Ambulatory Visit: Admitting: Urology

## 2024-07-30 VITALS — BP 101/68 | HR 88 | Ht 69.0 in | Wt 205.0 lb

## 2024-07-30 DIAGNOSIS — R972 Elevated prostate specific antigen [PSA]: Secondary | ICD-10-CM

## 2024-07-30 DIAGNOSIS — C61 Malignant neoplasm of prostate: Secondary | ICD-10-CM

## 2024-07-30 DIAGNOSIS — Z2989 Encounter for other specified prophylactic measures: Secondary | ICD-10-CM

## 2024-07-30 MED ORDER — GENTAMICIN SULFATE 40 MG/ML IJ SOLN
80.0000 mg | Freq: Once | INTRAMUSCULAR | Status: AC
  Administered 2024-07-30: 80 mg via INTRAMUSCULAR

## 2024-07-30 MED ORDER — LEVOFLOXACIN 500 MG PO TABS
500.0000 mg | ORAL_TABLET | Freq: Once | ORAL | Status: AC
  Administered 2024-07-30: 500 mg via ORAL

## 2024-07-30 NOTE — Patient Instructions (Signed)

## 2024-07-30 NOTE — Addendum Note (Signed)
 Addended by: ELOUISE SANTA BROCKS on: 07/30/2024 04:23 PM   Modules accepted: Orders

## 2024-07-30 NOTE — Progress Notes (Signed)
   07/30/24  Indication: Elevated PSA, 6.8  MRI Fusion Prostate Biopsy Procedure   Informed consent was obtained, and we discussed the risks of bleeding and infection/sepsis. A time out was performed to ensure correct patient identity.  Pre-Procedure: - Last PSA Level: 6.8 - Gentamicin  and levaquin  given for antibiotic prophylaxis - Prostate measured 76 g on MRI, PSA density 0.09 - PIRADS 5 lesions hypoechoic - Large median lobe/intravesical protrusion noted on MRI and TRUS  Procedure: - Prostate block performed using 10 cc 1% lidocaine   - MRI fusion biopsy was performed:  ROI#1: 4 cores  - Six standard template biopsies from lateral base, mid, and apex bilaterally - Total of 10 cores taken  Post-Procedure: - Patient tolerated the procedure well - He was counseled to seek immediate medical attention if experiences significant bleeding, fevers, or severe pain - Return in one week to discuss biopsy results  Assessment/ Plan: Will follow up in 1-2 weeks to discuss pathology with Dr. Twylla Redell Burnet, MD 07/30/2024

## 2024-08-04 LAB — PROSTATE CORE NEEDLE BIOPSY

## 2024-08-05 ENCOUNTER — Other Ambulatory Visit (HOSPITAL_COMMUNITY): Payer: Self-pay | Admitting: Cardiology

## 2024-08-07 ENCOUNTER — Ambulatory Visit: Admitting: Urology

## 2024-08-12 ENCOUNTER — Encounter: Payer: Self-pay | Admitting: Urology

## 2024-08-12 ENCOUNTER — Ambulatory Visit (INDEPENDENT_AMBULATORY_CARE_PROVIDER_SITE_OTHER): Admitting: Urology

## 2024-08-12 VITALS — BP 104/73 | HR 71 | Ht 69.0 in | Wt 200.0 lb

## 2024-08-12 DIAGNOSIS — C61 Malignant neoplasm of prostate: Secondary | ICD-10-CM

## 2024-08-12 NOTE — Progress Notes (Signed)
 08/12/2024 4:31 PM   Edward [name] [birth year] [id number]  Referring provider: Cleatus Arlyss RAMAN, MD [address] [place],  [place] [address]  Chief Complaint  Patient presents with   Results    HPI: Edward [name] is a 68 y.o. male presents for prostate biopsy follow-up.  PSA 05/01/2024 6.8; prostate MRI 10 mm lesion left mid anterior PZ with probable extracapsular extension-PI-RADS 5; volume 76 cc MR fusion biopsy 07/30/2024: 4 cores ROI and sextant biopsies lateral base, mid, apex bilaterally No postbiopsy complaints Pathology: ROI with Gleason 3+4 adenocarcinoma (60%); sextant biopsies benign prostate tissue   PMH: Past Medical History:  Diagnosis Date   CAD (coronary artery disease)    a. STEMI 2004 with occluded Cx and 80% RCA s/p DESx2. b. CABG 03/2021.   Chronic systolic CHF (congestive heart failure) (HCC)    CKD (chronic kidney disease), stage III (HCC)    HCV antibody positive    previous eval at [facility] hepatology clinic   Ischemic cardiomyopathy    Pre-diabetes    STEMI (ST elevation myocardial infarction) (HCC) 2004   with totally occluded CFX (infarct related artery) and 80% mRCA stenosis. Taxus DES to both vessles. EF was 50-55% by LV-gram   Thrombocytopenia [facility])     Surgical History: Past Surgical History:  Procedure Laterality Date   COLONOSCOPY WITH PROPOFOL  N/A 01/07/2024   Procedure: COLONOSCOPY WITH PROPOFOL ;  Surgeon: Therisa Bi, MD;  Location: [facility] ENDOSCOPY;  Service: Gastroenterology;  Laterality: N/A;   CORONARY ANGIOPLASTY WITH STENT PLACEMENT  08/22/03   CORONARY ARTERY BYPASS GRAFT N/A 03/22/2021   Procedure: CORONARY ARTERY BYPASS GRAFTING (CABG) X  THREE USING LEFT INTERNAL MAMMARY ARTERY AND RIGHT ENDOSCOPIC SAPHENOUS VEIN HARVEST CONDUITS;  Surgeon: Shyrl Linnie KIDD, MD;  Location: [facility] OR;  Service: Open Heart Surgery;  Laterality: N/A;   RIGHT/LEFT HEART CATH AND CORONARY ANGIOGRAPHY N/A 03/20/2021   Procedure: RIGHT/LEFT HEART CATH  AND CORONARY ANGIOGRAPHY;  Surgeon: Burnard Debby LABOR, MD;  Location: [facility] INVASIVE CV LAB;  Service: Cardiovascular;  Laterality: N/A;   TEE WITHOUT CARDIOVERSION N/A 03/22/2021   Procedure: TRANSESOPHAGEAL ECHOCARDIOGRAM (TEE);  Surgeon: Shyrl Linnie KIDD, MD;  Location: [facility] OR;  Service: Open Heart Surgery;  Laterality: N/A;    Home Medications:  Allergies as of 08/12/2024       Reactions   Spironolactone     Gynecomastia         Medication List        Accurate as of August 12, 2024  4:31 PM. If you have any questions, ask your nurse or doctor.          acetaminophen  500 MG tablet Commonly known as: TYLENOL  Take 2 tablets (1,000 mg total) by mouth every 6 (six) hours as needed.   aspirin  EC 81 MG tablet Take 1 tablet (81 mg total) by mouth daily.   atorvastatin  80 MG tablet Commonly known as: LIPITOR  Take 1 tablet (80 mg total) by mouth daily. PLEASE SCHEDULE APPOINTMENT FOR MORE REFILLS   clomiPHENE  50 MG tablet Commonly known as: CLOMID  Take 0.5 tablets (25 mg total) by mouth daily.   dapagliflozin  propanediol 10 MG Tabs tablet Commonly known as: Farxiga  Take 1 tablet (10 mg total) by mouth daily before breakfast.   digoxin  0.125 MG tablet Commonly known as: LANOXIN  Take 1 tablet (125 mcg total) by mouth daily.   Entresto  49-51 MG Generic drug: sacubitril -valsartan  TAKE 1 TABLET BY MOUTH TWICE DAILY   eplerenone  50 MG tablet Commonly known as:  INSPRA  Take 1 tablet by mouth once daily   furosemide  20 MG tablet Commonly known as: LASIX  TAKE ONE TABLET BY MOUTH ONCE DAILY FOR THREE DAYS. THEN TAKE AS NEEDED FOR SHORTNESS OF BREATH   loratadine  10 MG tablet Commonly known as: CLARITIN  Take 10 mg by mouth daily.   methocarbamol  500 MG tablet Commonly known as: ROBAXIN  Take 1 tablet (500 mg total) by mouth every 8 (eight) hours as needed for muscle spasms.   metoprolol  succinate 50 MG 24 hr tablet Commonly known as: TOPROL -XL Take 1 tablet by mouth  once daily   multivitamin with minerals Tabs tablet Take 1 tablet by mouth daily.   oxyCODONE  5 MG immediate release tablet Commonly known as: Oxy IR/ROXICODONE  Take 1-2 tablets (5-10 mg total) by mouth 2 (two) times daily as needed (pain).   Ozempic  (0.25 or 0.5 MG/DOSE) 2 MG/3ML Sopn Generic drug: Semaglutide (0.25 or 0.5MG /DOS) INJECT 0.5MG  UNDER THE SKIN ONCE WEEKLY   tadalafil  5 MG tablet Commonly known as: CIALIS  Take 1 tablet (5 mg total) by mouth daily.   Vitamin D3 125 MCG (5000 UT) Caps Take 1 capsule (5,000 Units total) by mouth daily.        Allergies:  Allergies  Allergen Reactions   Spironolactone      Gynecomastia     Family History: Family History  Problem Relation Age of Onset   Aneurysm Father        brain   Stroke Father    Other Mother        ASCVD   Hypertension Mother    Heart attack Other        uncle   Diabetes Child    Diabetes Child    Colon cancer Neg Hx    Prostate cancer Neg Hx     Social History:  reports that he quit smoking about 20 years ago. His smoking use included cigarettes. He started smoking about 30 years ago. He has a 10 pack-year smoking history. He has never used smokeless tobacco. He reports that he does not drink alcohol and does not use drugs.   Physical Exam: BP 104/73   Pulse 71   Ht 5' 9 (1.753 m)   Wt 200 lb (90.7 kg)   BMI 29.53 kg/m   Constitutional:  Alert, No acute distress.   Assessment & Plan:    1.  Prostate cancer NCCN risk stratification: Favorable intermediate; T1c Based on MRI findings and possible extracapsular extension I have recommended treatment.  We discussed both radical prostatectomy and radiation modalities including IMRT and brachytherapy.  We discussed the most common side effects of each. He specifically asked about no treatment and based on MRI findings I would not recommend.  He was interested in proceeding with further molecular testing and GPS was ordered Consider PSMA/PET  with MRI findings however insurance may not cover for intermediate risk disease   Glendia JAYSON Barba, MD  [facility] [address] [place], [place] [address] [phone]

## 2024-08-13 ENCOUNTER — Other Ambulatory Visit: Payer: Self-pay | Admitting: Family Medicine

## 2024-08-31 ENCOUNTER — Other Ambulatory Visit: Payer: Self-pay

## 2024-09-02 MED ORDER — DAPAGLIFLOZIN PROPANEDIOL 10 MG PO TABS: 10.0000 mg | ORAL_TABLET | Freq: Every day | ORAL | 11 refills | Status: AC

## 2024-09-23 NOTE — Progress Notes (Signed)
 Pink Maye                                          MRN: [id number]   09/23/2024   The VBCI Quality Team Specialist reviewed this patient medical record for the purposes of chart review for care gap closure. The following were reviewed: chart review for care gap closure-glycemic status assessment and kidney health evaluation for diabetes:eGFR  and uACR.    VBCI Quality Team

## 2024-09-25 ENCOUNTER — Other Ambulatory Visit (HOSPITAL_COMMUNITY): Payer: Self-pay | Admitting: Cardiology

## 2024-10-12 ENCOUNTER — Encounter: Payer: Self-pay | Admitting: Urology

## 2024-10-27 ENCOUNTER — Other Ambulatory Visit (HOSPITAL_COMMUNITY): Payer: Self-pay | Admitting: Cardiology

## 2024-11-06 ENCOUNTER — Other Ambulatory Visit (HOSPITAL_COMMUNITY): Payer: Self-pay | Admitting: Internal Medicine

## 2024-11-06 ENCOUNTER — Other Ambulatory Visit (HOSPITAL_COMMUNITY): Payer: Self-pay | Admitting: Cardiology

## 2024-11-16 NOTE — Progress Notes (Signed)
 Xaine Sansom                                          MRN: [id number]   11/16/2024   The VBCI Quality Team Specialist reviewed this patient medical record for the purposes of chart review for care gap closure. The following were reviewed: chart review for care gap closure-glycemic status assessment and kidney health evaluation for diabetes:eGFR  and uACR.    VBCI Quality Team

## 2024-11-22 ENCOUNTER — Telehealth: Payer: Self-pay | Admitting: Urology

## 2024-11-22 NOTE — Telephone Encounter (Signed)
 Sent [facility] message 10/12/24 regarding prostate cancer tx recc and he never responded- pls check in with pt

## 2024-12-01 NOTE — Progress Notes (Signed)
 Edward [name]                                          MRN: [id number]   12/01/2024   The VBCI Quality Team Specialist reviewed this patient medical record for the purposes of chart review for care gap closure. The following were reviewed: chart review for care gap closure-glycemic status assessment and kidney health evaluation for diabetes:eGFR  and uACR.    VBCI Quality Team

## 2024-12-14 LAB — OPHTHALMOLOGY REPORT-SCANNED

## 2024-12-15 DIAGNOSIS — C61 Malignant neoplasm of prostate: Secondary | ICD-10-CM | POA: Insufficient documentation

## 2024-12-15 NOTE — Assessment & Plan Note (Addendum)
 Favorable intermediate-risk prostate Ca  - PSA 6.8 (May 2025)   - MRI - PIRADS 5 at left mid anterior PZ, 76g gland  - Fusion biopsy (07/30/24) - GS 3+4=7 in 1/7 cores (up to 60% involvement) at ROI (left mid PZ), all sextant cores benign   We had a detailed conversation today about his prostate cancer diagnosis.  We reviewed his prior workup, lab data, and biopsy results. Using this information, and comparing against thousands of men who have come before him, with similar diagnostic findings, we are able to risk-stratify his disease and offer the safest and most effective treatment option. AUA risk stratifications include very low risk, low risk, intermediate risk, and high risk disease. He meets criteria for favorable intermediate risk. I explained the need for additional staging with unfavorable intermediate risk and high risk groups, including conventional CT scans, bone scans, and newer modalities such as PSMA/PET. I explained that his life expectancy, clinical stage, Gleason score, PSA, and relevant co-morbidities influence our treatment strategies and options. We discussed the roles of active surveillance versus definitive therapies, in the form of radiation or surgery. A treatment should be carefully selected weighing all factors, including patient preference, with a goal to minimize long-term morbidity while preserving cancer-free mortality. For definitive treatments, I reviewed the comparable oncologic outcomes regarding radiation and surgery-- each with their attendant risk profiles and timelines. I emphasized that patients with baseline urinary or lower colonic symptoms may be impacted by treatment strategy, as patients with severe lower urinary tract symptoms may have significant worsening or even develop urinary retention after undergoing radiation.  In regards to surgery, we discussed the robotic radical prostatectomy +/- lymphadenectomy at length. The procedure takes 3 to 4 hours under general  anesthesia, with expected discharge home on post-op day #1.  A Foley catheter is left in place for 7 to 10 days to allow for healing of the vesicourethral anastomosis. There is a small risk of bleeding, infection, damage to surrounding structures or bowel, hernia, DVT/PE, or serious cardiac or pulmonary complications.  We discussed post-op side effects including erectile dysfunction, and the importance of pre-operative erectile function on long-term outcomes.  Even with a nerve sparing approach, there is an approximately 25% rate of permanent erectile dysfunction. We also discussed postop urinary incontinence at length.  We expect patients to have stress incontinence post-operatively that slowly improves over a period of weeks to months. Less than 10% of men will require a pad at 1 year after surgery. Pelvic floor strengthening and rehabilitation is strongly recommended to improve these outcomes. Patients will need to avoid heavy lifting and strenuous activity for 3 to 4 weeks, but most men return to their baseline activity status by 6 weeks.  - Low volume favorable intermediate risk - I think he would be a reasonable candidate for active surveillance, at least initially. Would repeat a PSA today, and again in 6 mo  - Plan for confirmation prostate biopsy in ~May/June 2026 timeframe

## 2024-12-15 NOTE — Progress Notes (Signed)
 "  12/17/2024 10:48 AM   Edward [name] [birth year] [id number]  Reason for visit: Follow up prostate Ca   HPI: 69 y.o. male, initial follow up with me today, previously seen by Dr. Twylla in Sept 2025  Time meeting today, pleasant gentleman Reviewed his pathology He also inquired about low testosterone  management-total testosterone 's have ranged 300-587 over the last year  - He was previously on Clomid , but not currently taking  Prior HPI: Hx of prostate Ca  - PSA 6.8 (May 2025)   - MRI - PIRADS 5 at left mid anterior PZ, 76g gland  - Fusion biopsy (07/30/24) - GS 3+4=7 in 1/7 cores (up to 60% involvement) at ROI (left mid PZ), all sextant cores benign     Physical Exam: BP 108/73   Pulse 79   Ht 5' 9 (1.753 m)   Wt 200 lb (90.7 kg)   BMI 29.53 kg/m    General: no acute distress, alert/oriented, conversational  HEENT: equal nondilated pupils CV: regular rate Lung: unlabored breathing, regular rate and rhythm  Abd: nondistended, nontender with palpation, no palpable masses  MSK: moving all extremities without issue, normal observed motor function   Laboratory Data:             Component Ref Range & Units (hover) 7 mo ago 11 mo ago 1 yr ago 7 yr ago  Testosterone  587 314.23 R 265.21 Low  R 221 R    Pertinent Imaging: MRI prostate (06/17/24) - IMPRESSION: 10 mm lesion in the left mid anterior peripheral zone, with probable extracapsular extension, highly suspicious for high-grade prostate carcinoma. PI-RADS 5 (v2.1): Very high (clinically significant cancer highly likely)    Assessment & Plan:    Prostate cancer [facility]) Assessment & Plan: Favorable intermediate-risk prostate Ca  - PSA 6.8 (May 2025)   - MRI - PIRADS 5 at left mid anterior PZ, 76g gland  - Fusion biopsy (07/30/24) - GS 3+4=7 in 1/7 cores (up to 60% involvement) at ROI (left mid PZ), all sextant cores benign   We had a detailed conversation today about his prostate cancer diagnosis.  We  reviewed his prior workup, lab data, and biopsy results. Using this information, and comparing against thousands of men who have come before him, with similar diagnostic findings, we are able to risk-stratify his disease and offer the safest and most effective treatment option. AUA risk stratifications include very low risk, low risk, intermediate risk, and high risk disease. He meets criteria for favorable intermediate risk. I explained the need for additional staging with unfavorable intermediate risk and high risk groups, including conventional CT scans, bone scans, and newer modalities such as PSMA/PET. I explained that his life expectancy, clinical stage, Gleason score, PSA, and relevant co-morbidities influence our treatment strategies and options. We discussed the roles of active surveillance versus definitive therapies, in the form of radiation or surgery. A treatment should be carefully selected weighing all factors, including patient preference, with a goal to minimize long-term morbidity while preserving cancer-free mortality. For definitive treatments, I reviewed the comparable oncologic outcomes regarding radiation and surgery-- each with their attendant risk profiles and timelines. I emphasized that patients with baseline urinary or lower colonic symptoms may be impacted by treatment strategy, as patients with severe lower urinary tract symptoms may have significant worsening or even develop urinary retention after undergoing radiation.  In regards to surgery, we discussed the robotic radical prostatectomy +/- lymphadenectomy at length. The procedure takes 3 to 4 hours under general anesthesia,  with expected discharge home on post-op day #1.  A Foley catheter is left in place for 7 to 10 days to allow for healing of the vesicourethral anastomosis. There is a small risk of bleeding, infection, damage to surrounding structures or bowel, hernia, DVT/PE, or serious cardiac or pulmonary complications.  We  discussed post-op side effects including erectile dysfunction, and the importance of pre-operative erectile function on long-term outcomes.  Even with a nerve sparing approach, there is an approximately 25% rate of permanent erectile dysfunction. We also discussed postop urinary incontinence at length.  We expect patients to have stress incontinence post-operatively that slowly improves over a period of weeks to months. Less than 10% of men will require a pad at 1 year after surgery. Pelvic floor strengthening and rehabilitation is strongly recommended to improve these outcomes. Patients will need to avoid heavy lifting and strenuous activity for 3 to 4 weeks, but most men return to their baseline activity status by 6 weeks.  - Low volume favorable intermediate risk - I think he would be a reasonable candidate for active surveillance, at least initially. Would repeat a PSA today, and again in 6 mo  - Plan for confirmation prostate biopsy in ~May/June 2026 timeframe    Orders: -     PSA; Future -     PSA; Future  Testosterone  deficiency Assessment & Plan: History of low T  -Previously on Clomid , now discontinued  -New diagnosis intermediate risk prostate cancer  He inquired about this today.  I suspect his total testosterone  is lower since he discontinued Clomid .  I did review the relationship between testosterone  and prostate cancer.  As we are engaging in initial active surveillance for his prostate cancer, I would prefer to hold on reinitiation of TRT-at least until confirmation biopsy.  I am not opposed to careful TRT in selected patients with proper monitoring.  Alternatively, I did review the role of definitive prostate cancer treatment and subsequent TRT-which is safe in low risk patients.         Penne JONELLE Skye, MD  [facility] [address], [address] [place], [place] [address] [phone] "

## 2024-12-17 ENCOUNTER — Ambulatory Visit: Admitting: Urology

## 2024-12-17 VITALS — BP 108/73 | HR 79 | Ht 69.0 in | Wt 200.0 lb

## 2024-12-17 DIAGNOSIS — C61 Malignant neoplasm of prostate: Secondary | ICD-10-CM

## 2024-12-17 DIAGNOSIS — E349 Endocrine disorder, unspecified: Secondary | ICD-10-CM | POA: Insufficient documentation

## 2024-12-17 NOTE — Assessment & Plan Note (Signed)
 History of low T  -Previously on Clomid , now discontinued  -New diagnosis intermediate risk prostate cancer  He inquired about this today.  I suspect his total testosterone  is lower since he discontinued Clomid .  I did review the relationship between testosterone  and prostate cancer.  As we are engaging in initial active surveillance for his prostate cancer, I would prefer to hold on reinitiation of TRT-at least until confirmation biopsy.  I am not opposed to careful TRT in selected patients with proper monitoring.  Alternatively, I did review the role of definitive prostate cancer treatment and subsequent TRT-which is safe in low risk patients.

## 2024-12-18 LAB — PSA: Prostate Specific Ag, Serum: 8.6 ng/mL — ABNORMAL HIGH (ref 0.0–4.0)

## 2024-12-28 ENCOUNTER — Encounter: Payer: Self-pay | Admitting: Family Medicine

## 2024-12-28 ENCOUNTER — Ambulatory Visit: Admitting: Family Medicine

## 2024-12-28 VITALS — BP 98/72 | HR 77 | Temp 97.8°F | Ht 69.0 in | Wt 201.0 lb

## 2024-12-28 DIAGNOSIS — C61 Malignant neoplasm of prostate: Secondary | ICD-10-CM

## 2024-12-28 DIAGNOSIS — R7989 Other specified abnormal findings of blood chemistry: Secondary | ICD-10-CM

## 2024-12-28 DIAGNOSIS — E1159 Type 2 diabetes mellitus with other circulatory complications: Secondary | ICD-10-CM

## 2024-12-28 LAB — MICROALBUMIN / CREATININE URINE RATIO
Creatinine,U: 236.2 mg/dL
Microalb Creat Ratio: 4.5 mg/g (ref 0.0–30.0)
Microalb, Ur: 1.1 mg/dL (ref 0.7–1.9)

## 2024-12-28 MED ORDER — FUROSEMIDE 20 MG PO TABS: 20.0000 mg | ORAL_TABLET | Freq: Every day | ORAL | Status: AC | PRN

## 2024-12-28 NOTE — Patient Instructions (Addendum)
 Go to the lab on the way out.   If you have mychart we'll likely use that to update you.    Take care.  Glad to see you.

## 2024-12-28 NOTE — Progress Notes (Unsigned)
 He has been off clomid  for about 1 month.  Had labs done about 10 days ago with elevated PSA and HGB.  Testosterone  wnl.   Prostate cancer on bx, with plan for active surveillance.    Not lightheaded on standing.    No known OSA.  Prev sleep test with no evidence of Obstructive Sleep Apnea with AHI 4. 7/ hr.  Hemoconcentration from meds?  Diabetes:  Using medications without difficulties: yes Hypoglycemic episodes:no Hyperglycemic episodes:no Feet problems:no Blood Sugars averaging: usually <100.   eye exam within last year: 12/14/24, [place].   PMH and SH reviewed  Meds, vitals, and allergies reviewed.   ROS: Per HPI unless specifically indicated in ROS section   GEN: nad, alert and oriented HEENT: ncat NECK: supple w/o LA CV: rrr. PULM: ctab, no inc wob ABD: soft, +bs EXT: no edema SKIN: no acute rash  Diabetic foot exam: Normal inspection No skin breakdown No calluses  Normal DP pulses Normal sensation to light touch and monofilament Nails normal

## 2024-12-28 NOTE — Progress Notes (Signed)
 Edward [name]                                          MRN: [id number]   12/28/2024   The VBCI Quality Team Specialist reviewed this patient medical record for the purposes of chart review for care gap closure. The following were reviewed: chart review for care gap closure-glycemic status assessment.    VBCI Quality Team

## 2024-12-29 ENCOUNTER — Ambulatory Visit

## 2024-12-29 ENCOUNTER — Other Ambulatory Visit: Payer: Self-pay | Admitting: Family Medicine

## 2024-12-29 VITALS — Ht 69.0 in | Wt 201.0 lb

## 2024-12-29 DIAGNOSIS — Z0000 Encounter for general adult medical examination without abnormal findings: Secondary | ICD-10-CM | POA: Diagnosis not present

## 2024-12-29 LAB — CBC WITH DIFFERENTIAL/PLATELET
Basophils Absolute: 0 K/uL (ref 0.0–0.1)
Basophils Relative: 0.6 % (ref 0.0–3.0)
Eosinophils Absolute: 0.2 K/uL (ref 0.0–0.7)
Eosinophils Relative: 3.9 % (ref 0.0–5.0)
HCT: 53 % — ABNORMAL HIGH (ref 39.0–52.0)
Hemoglobin: 17.9 g/dL — ABNORMAL HIGH (ref 13.0–17.0)
Lymphocytes Relative: 36.7 % (ref 12.0–46.0)
Lymphs Abs: 1.5 K/uL (ref 0.7–4.0)
MCHC: 33.9 g/dL (ref 30.0–36.0)
MCV: 90.8 fl (ref 78.0–100.0)
Monocytes Absolute: 0.4 K/uL (ref 0.1–1.0)
Monocytes Relative: 8.4 % (ref 3.0–12.0)
Neutro Abs: 2.1 K/uL (ref 1.4–7.7)
Neutrophils Relative %: 50.4 % (ref 43.0–77.0)
Platelets: 104 K/uL — ABNORMAL LOW (ref 150.0–400.0)
RBC: 5.83 Mil/uL — ABNORMAL HIGH (ref 4.22–5.81)
RDW: 13.7 % (ref 11.5–15.5)
WBC: 4.2 K/uL (ref 4.0–10.5)

## 2024-12-29 LAB — COMPREHENSIVE METABOLIC PANEL WITH GFR
ALT: 32 U/L (ref 3–53)
AST: 22 U/L (ref 5–37)
Albumin: 4.4 g/dL (ref 3.5–5.2)
Alkaline Phosphatase: 56 U/L (ref 39–117)
BUN: 19 mg/dL (ref 6–23)
CO2: 28 meq/L (ref 19–32)
Calcium: 9.6 mg/dL (ref 8.4–10.5)
Chloride: 104 meq/L (ref 96–112)
Creatinine, Ser: 1.88 mg/dL — ABNORMAL HIGH (ref 0.40–1.50)
GFR: 36.06 mL/min — ABNORMAL LOW
Glucose, Bld: 92 mg/dL (ref 70–99)
Potassium: 4.5 meq/L (ref 3.5–5.1)
Sodium: 139 meq/L (ref 135–145)
Total Bilirubin: 0.8 mg/dL (ref 0.2–1.2)
Total Protein: 6.9 g/dL (ref 6.0–8.3)

## 2024-12-29 LAB — HEMOGLOBIN A1C: Hgb A1c MFr Bld: 5.8 % (ref 4.6–6.5)

## 2024-12-29 LAB — IRON: Iron: 207 ug/dL — ABNORMAL HIGH (ref 42–165)

## 2024-12-29 NOTE — Patient Instructions (Signed)
 Edward [name],  Thank you for taking the time for your Medicare Wellness Visit. I appreciate your continued commitment to your health goals. Please review the care plan we discussed, and feel free to reach out if I can assist you further.  Please note that Annual Wellness Visits do not include a physical exam. Some assessments may be limited, especially if the visit was conducted virtually. If needed, we may recommend an in-person follow-up with your provider.  Ongoing Care Seeing your primary care provider every 3 to 6 months helps us  monitor your health and provide consistent, personalized care.   Referrals If a referral was made during today's visit and you haven't received any updates within two weeks, please contact the referred provider directly to check on the status.  Recommended Screenings:  Health Maintenance  Topic Date Due   Zoster (Shingles) Vaccine (1 of 2) Never done   DTaP/Tdap/Td vaccine (3 - Tdap) 08/04/2020   Pneumococcal Vaccine for age over 74 (2 of 2 - PCV) 03/21/2022   COVID-19 Vaccine (6 - 2025-26 season) 08/10/2024   Medicare Annual Wellness Visit  12/11/2024   Eye exam for diabetics  12/18/2024   Hemoglobin A1C  06/27/2025   Yearly kidney function blood test for diabetes  12/28/2025   Kidney health urinalysis for diabetes  12/28/2025   Complete foot exam   12/28/2025   Colon Cancer Screening  01/06/2034   Flu Shot  Completed   Hepatitis C Screening  Completed   Meningitis B Vaccine  Aged Out       02/27/2024    8:11 PM  Advanced Directives  Does Patient Have a Medical Advance Directive? No    Vision: Annual vision screenings are recommended for early detection of glaucoma, cataracts, and diabetic retinopathy. These exams can also reveal signs of chronic conditions such as diabetes and high blood pressure.  Dental: Annual dental screenings help detect early signs of oral cancer, gum disease, and other conditions linked to overall health, including heart  disease and diabetes.  Please see the attached documents for additional preventive care recommendations.
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
## 2024-12-30 ENCOUNTER — Ambulatory Visit: Payer: Self-pay | Admitting: Family Medicine

## 2024-12-30 DIAGNOSIS — E1159 Type 2 diabetes mellitus with other circulatory complications: Secondary | ICD-10-CM

## 2024-12-30 DIAGNOSIS — R7989 Other specified abnormal findings of blood chemistry: Secondary | ICD-10-CM

## 2024-12-30 NOTE — Assessment & Plan Note (Signed)
 With plan for active surveillance per urology.  I will defer.

## 2024-12-30 NOTE — Telephone Encounter (Signed)
 See result note.  See if he wants to continue on Ozempic .  It would be reasonable to try stopping it and then rechecking his A1c in 3 months.  Please let me know what he wants to do about that.  Thanks.

## 2024-12-30 NOTE — Assessment & Plan Note (Signed)
 History of.  See notes on labs.  Continue semaglutide  for now but he may be able to stop that medication depending on his A1c.

## 2024-12-30 NOTE — Telephone Encounter (Signed)
 Patient has been on this dose for a while. Ok to refill or is there plan to change?

## 2024-12-30 NOTE — Assessment & Plan Note (Signed)
 With elevated hemoglobin.  Could have been related to previous Clomid  use. Repeat labs pending. No known OSA.  Prev sleep test with no evidence of Obstructive Sleep Apnea with AHI 4. 7/ hr. Discussed that he could have had hemoconcentration that was med related. See notes on labs.

## 2024-12-31 ENCOUNTER — Telehealth: Payer: Self-pay

## 2024-12-31 NOTE — Telephone Encounter (Signed)
 Called patient would like to stop and check labs in 3 months.  No further action needed at this time.

## 2024-12-31 NOTE — Telephone Encounter (Signed)
 Patient called office back wanted to know why his hemoglobin is elevated. Want's to know what could be reason and if he can do anything to change it?

## 2024-12-31 NOTE — Telephone Encounter (Signed)
 Prev HGB elevation could be from clomid  use.  Would stop clomid  and recheck labs as planned in the result note.  Thanks.

## 2024-12-31 NOTE — Telephone Encounter (Signed)
 Left message to return call to our office.  See results notes as well.

## 2025-01-01 NOTE — Telephone Encounter (Signed)
 Relayed message to pt. Pt repeated message back to me. No further action needed.

## 2025-01-07 NOTE — Progress Notes (Signed)
 Edward [name]                                          MRN: [id number]   01/07/2025   The [facility] Quality Team Specialist reviewed this patient medical record for the purposes of chart review for care gap closure. The following were reviewed: chart review for care gap closure-glycemic status assessment.    [facility] Quality Team

## 2025-01-22 ENCOUNTER — Encounter: Admitting: Family Medicine

## 2025-03-31 ENCOUNTER — Other Ambulatory Visit

## 2025-05-13 ENCOUNTER — Other Ambulatory Visit

## 2025-06-03 ENCOUNTER — Other Ambulatory Visit: Admitting: Urology
